# Patient Record
Sex: Female | Born: 1940
Health system: Southern US, Community
[De-identification: ages and names within clinical notes are randomized; demographics above are authoritative.]

## PROBLEM LIST (undated history)

## (undated) DIAGNOSIS — R519 Headache, unspecified: Secondary | ICD-10-CM

## (undated) DIAGNOSIS — F419 Anxiety disorder, unspecified: Secondary | ICD-10-CM

## (undated) DIAGNOSIS — E039 Hypothyroidism, unspecified: Secondary | ICD-10-CM

## (undated) DIAGNOSIS — L0390 Cellulitis, unspecified: Secondary | ICD-10-CM

## (undated) DIAGNOSIS — K219 Gastro-esophageal reflux disease without esophagitis: Secondary | ICD-10-CM

## (undated) DIAGNOSIS — C50919 Malignant neoplasm of unspecified site of unspecified female breast: Secondary | ICD-10-CM

## (undated) DIAGNOSIS — C4490 Unspecified malignant neoplasm of skin, unspecified: Secondary | ICD-10-CM

## (undated) DIAGNOSIS — R112 Nausea with vomiting, unspecified: Secondary | ICD-10-CM

## (undated) DIAGNOSIS — K5730 Diverticulosis of large intestine without perforation or abscess without bleeding: Secondary | ICD-10-CM

## (undated) DIAGNOSIS — T7840XA Allergy, unspecified, initial encounter: Secondary | ICD-10-CM

## (undated) DIAGNOSIS — R51 Headache: Secondary | ICD-10-CM

## (undated) DIAGNOSIS — Z8719 Personal history of other diseases of the digestive system: Secondary | ICD-10-CM

## (undated) DIAGNOSIS — R42 Dizziness and giddiness: Secondary | ICD-10-CM

## (undated) DIAGNOSIS — Z923 Personal history of irradiation: Secondary | ICD-10-CM

## (undated) DIAGNOSIS — Z9889 Other specified postprocedural states: Secondary | ICD-10-CM

## (undated) HISTORY — PX: OTHER SURGICAL HISTORY: SHX169

## (undated) HISTORY — DX: Unspecified malignant neoplasm of skin, unspecified: C44.90

## (undated) HISTORY — DX: Gastro-esophageal reflux disease without esophagitis: K21.9

## (undated) HISTORY — DX: Diverticulosis of large intestine without perforation or abscess without bleeding: K57.30

## (undated) HISTORY — DX: Hypothyroidism, unspecified: E03.9

## (undated) HISTORY — DX: Malignant neoplasm of unspecified site of unspecified female breast: C50.919

## (undated) HISTORY — PX: UPPER GI ENDOSCOPY: SHX6162

## (undated) HISTORY — DX: Allergy, unspecified, initial encounter: T78.40XA

## (undated) HISTORY — PX: DIAGNOSTIC LAPAROSCOPY: SUR761

## (undated) HISTORY — DX: Anxiety disorder, unspecified: F41.9

## (undated) HISTORY — PX: MASTECTOMY: SHX3

## (undated) HISTORY — PX: COLONOSCOPY: SHX174

## (undated) HISTORY — DX: Dizziness and giddiness: R42

## (undated) HISTORY — DX: Personal history of irradiation: Z92.3

## (undated) HISTORY — PX: BREAST BIOPSY: SHX20

---
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
## 1968-12-03 HISTORY — PX: OTHER SURGICAL HISTORY: SHX169

## 1981-12-03 HISTORY — PX: BREAST EXCISIONAL BIOPSY: SUR124

## 2003-04-20 HISTORY — PX: ESOPHAGOGASTRODUODENOSCOPY: SHX1529

## 2004-12-03 LAB — HM DEXA SCAN

## 2005-10-22 ENCOUNTER — Ambulatory Visit: Payer: Self-pay

## 2006-07-29 ENCOUNTER — Ambulatory Visit: Payer: Self-pay | Admitting: Family Medicine

## 2006-08-30 ENCOUNTER — Ambulatory Visit: Payer: Self-pay | Admitting: Family Medicine

## 2006-10-10 ENCOUNTER — Ambulatory Visit: Payer: Self-pay | Admitting: Family Medicine

## 2006-12-16 ENCOUNTER — Encounter: Payer: Self-pay | Admitting: Family Medicine

## 2006-12-16 ENCOUNTER — Ambulatory Visit: Payer: Self-pay | Admitting: Family Medicine

## 2006-12-16 ENCOUNTER — Other Ambulatory Visit: Admission: RE | Admit: 2006-12-16 | Discharge: 2006-12-16 | Payer: Self-pay | Admitting: Family Medicine

## 2006-12-16 LAB — CONVERTED CEMR LAB
ALT: 29 units/L (ref 0–40)
Albumin: 4.2 g/dL (ref 3.5–5.2)
BUN: 16 mg/dL (ref 6–23)
Calcium: 9.7 mg/dL (ref 8.4–10.5)
GFR calc non Af Amer: 77 mL/min
Glomerular Filtration Rate, Af Am: 93 mL/min/{1.73_m2}
HCT: 44.9 % (ref 36.0–46.0)
MCV: 91 fL (ref 78.0–100.0)
Monocytes Absolute: 0.6 10*3/uL (ref 0.2–0.7)
Neutro Abs: 3.9 10*3/uL (ref 1.4–7.7)
Phosphorus Concentration: 5 mg/dL — ABNORMAL HIGH (ref 2.3–4.6)
Potassium: 3.6 meq/L (ref 3.5–5.1)
RBC: 4.94 M/uL (ref 3.87–5.11)
Triglyceride fasting, serum: 125 mg/dL (ref 0–149)
VLDL: 25 mg/dL (ref 0–40)
WBC: 7.1 10*3/uL (ref 4.5–10.5)

## 2006-12-30 ENCOUNTER — Ambulatory Visit: Payer: Self-pay

## 2007-01-01 ENCOUNTER — Ambulatory Visit: Payer: Self-pay | Admitting: Family Medicine

## 2007-03-24 ENCOUNTER — Ambulatory Visit: Payer: Self-pay | Admitting: Family Medicine

## 2007-03-24 LAB — CONVERTED CEMR LAB
ALT: 21 units/L (ref 0–40)
AST: 28 units/L (ref 0–37)
Cholesterol: 197 mg/dL (ref 0–200)
LDL Cholesterol: 127 mg/dL — ABNORMAL HIGH (ref 0–99)
Total CHOL/HDL Ratio: 3.7
VLDL: 17 mg/dL (ref 0–40)

## 2007-05-08 ENCOUNTER — Telehealth (INDEPENDENT_AMBULATORY_CARE_PROVIDER_SITE_OTHER): Payer: Self-pay | Admitting: *Deleted

## 2007-07-04 HISTORY — PX: APPENDECTOMY: SHX54

## 2007-07-08 ENCOUNTER — Telehealth (INDEPENDENT_AMBULATORY_CARE_PROVIDER_SITE_OTHER): Payer: Self-pay | Admitting: *Deleted

## 2007-07-13 ENCOUNTER — Ambulatory Visit: Payer: Self-pay | Admitting: General Surgery

## 2007-10-17 ENCOUNTER — Telehealth: Payer: Self-pay | Admitting: Family Medicine

## 2007-10-17 ENCOUNTER — Ambulatory Visit: Payer: Self-pay | Admitting: Family Medicine

## 2007-10-17 DIAGNOSIS — M810 Age-related osteoporosis without current pathological fracture: Secondary | ICD-10-CM | POA: Insufficient documentation

## 2007-11-04 ENCOUNTER — Encounter: Payer: Self-pay | Admitting: Family Medicine

## 2007-12-03 ENCOUNTER — Ambulatory Visit: Payer: Self-pay | Admitting: Family Medicine

## 2008-01-23 ENCOUNTER — Encounter: Payer: Self-pay | Admitting: Family Medicine

## 2008-01-23 DIAGNOSIS — K5730 Diverticulosis of large intestine without perforation or abscess without bleeding: Secondary | ICD-10-CM | POA: Insufficient documentation

## 2008-01-23 DIAGNOSIS — K589 Irritable bowel syndrome without diarrhea: Secondary | ICD-10-CM

## 2008-01-23 DIAGNOSIS — F419 Anxiety disorder, unspecified: Secondary | ICD-10-CM

## 2008-01-23 DIAGNOSIS — E039 Hypothyroidism, unspecified: Secondary | ICD-10-CM

## 2008-01-23 DIAGNOSIS — N6019 Diffuse cystic mastopathy of unspecified breast: Secondary | ICD-10-CM

## 2008-01-23 DIAGNOSIS — J309 Allergic rhinitis, unspecified: Secondary | ICD-10-CM | POA: Insufficient documentation

## 2008-01-23 DIAGNOSIS — K219 Gastro-esophageal reflux disease without esophagitis: Secondary | ICD-10-CM

## 2008-01-23 DIAGNOSIS — E7800 Pure hypercholesterolemia, unspecified: Secondary | ICD-10-CM

## 2008-02-03 ENCOUNTER — Encounter: Payer: Self-pay | Admitting: Family Medicine

## 2008-02-03 ENCOUNTER — Other Ambulatory Visit: Admission: RE | Admit: 2008-02-03 | Discharge: 2008-02-03 | Payer: Self-pay | Admitting: Family Medicine

## 2008-02-03 ENCOUNTER — Ambulatory Visit: Payer: Self-pay | Admitting: Family Medicine

## 2008-02-09 ENCOUNTER — Encounter (INDEPENDENT_AMBULATORY_CARE_PROVIDER_SITE_OTHER): Payer: Self-pay | Admitting: *Deleted

## 2008-02-11 ENCOUNTER — Ambulatory Visit: Payer: Self-pay | Admitting: Family Medicine

## 2008-02-16 ENCOUNTER — Encounter: Payer: Self-pay | Admitting: Family Medicine

## 2008-02-16 LAB — CONVERTED CEMR LAB
ALT: 21 units/L (ref 0–35)
Alkaline Phosphatase: 44 units/L (ref 39–117)
BUN: 12 mg/dL (ref 6–23)
Basophils Relative: 0.3 % (ref 0.0–1.0)
CO2: 30 meq/L (ref 19–32)
Calcium: 9.2 mg/dL (ref 8.4–10.5)
Cholesterol: 219 mg/dL (ref 0–200)
Creatinine, Ser: 0.8 mg/dL (ref 0.4–1.2)
Hemoglobin: 13.2 g/dL (ref 12.0–15.0)
Monocytes Absolute: 0.5 10*3/uL (ref 0.2–0.7)
Monocytes Relative: 8.5 % (ref 3.0–11.0)
Potassium: 3.8 meq/L (ref 3.5–5.1)
RDW: 13.1 % (ref 11.5–14.6)
Total Bilirubin: 0.8 mg/dL (ref 0.3–1.2)
Total CHOL/HDL Ratio: 3.4
Total Protein: 6.8 g/dL (ref 6.0–8.3)
VLDL: 13 mg/dL (ref 0–40)
WBC: 5.5 10*3/uL (ref 4.5–10.5)

## 2008-02-17 ENCOUNTER — Telehealth: Payer: Self-pay | Admitting: Family Medicine

## 2008-03-02 ENCOUNTER — Ambulatory Visit: Payer: Self-pay | Admitting: Family Medicine

## 2008-03-02 LAB — CONVERTED CEMR LAB: OCCULT 3: NEGATIVE

## 2008-03-03 ENCOUNTER — Encounter (INDEPENDENT_AMBULATORY_CARE_PROVIDER_SITE_OTHER): Payer: Self-pay | Admitting: *Deleted

## 2008-03-03 LAB — FECAL OCCULT BLOOD, GUAIAC: Fecal Occult Blood: NEGATIVE

## 2008-03-17 ENCOUNTER — Encounter: Payer: Self-pay | Admitting: Family Medicine

## 2008-03-22 ENCOUNTER — Encounter (INDEPENDENT_AMBULATORY_CARE_PROVIDER_SITE_OTHER): Payer: Self-pay | Admitting: *Deleted

## 2008-03-31 ENCOUNTER — Telehealth: Payer: Self-pay | Admitting: Family Medicine

## 2008-03-31 ENCOUNTER — Ambulatory Visit: Payer: Self-pay | Admitting: Family Medicine

## 2008-04-01 ENCOUNTER — Encounter (INDEPENDENT_AMBULATORY_CARE_PROVIDER_SITE_OTHER): Payer: Self-pay | Admitting: *Deleted

## 2008-04-01 LAB — CONVERTED CEMR LAB: TSH: 0.03 microintl units/mL — ABNORMAL LOW (ref 0.35–5.50)

## 2008-05-19 ENCOUNTER — Ambulatory Visit: Payer: Self-pay | Admitting: Family Medicine

## 2008-05-20 ENCOUNTER — Encounter (INDEPENDENT_AMBULATORY_CARE_PROVIDER_SITE_OTHER): Payer: Self-pay | Admitting: *Deleted

## 2008-05-20 LAB — CONVERTED CEMR LAB
Free T4: 2.3 ng/dL — ABNORMAL HIGH (ref 0.6–1.6)
TSH: 0.02 microintl units/mL — ABNORMAL LOW (ref 0.35–5.50)

## 2008-05-21 LAB — CONVERTED CEMR LAB: Vit D, 1,25-Dihydroxy: 32 (ref 30–89)

## 2008-06-07 ENCOUNTER — Telehealth: Payer: Self-pay | Admitting: Family Medicine

## 2008-07-07 ENCOUNTER — Ambulatory Visit: Payer: Self-pay | Admitting: Family Medicine

## 2008-08-20 ENCOUNTER — Ambulatory Visit: Payer: Self-pay | Admitting: Family Medicine

## 2008-08-20 DIAGNOSIS — J069 Acute upper respiratory infection, unspecified: Secondary | ICD-10-CM | POA: Insufficient documentation

## 2008-09-27 ENCOUNTER — Ambulatory Visit: Payer: Self-pay | Admitting: Family Medicine

## 2008-10-07 ENCOUNTER — Telehealth: Payer: Self-pay | Admitting: Family Medicine

## 2009-02-24 ENCOUNTER — Ambulatory Visit: Payer: Self-pay | Admitting: Family Medicine

## 2009-03-01 ENCOUNTER — Telehealth: Payer: Self-pay | Admitting: Family Medicine

## 2009-03-24 ENCOUNTER — Ambulatory Visit: Payer: Self-pay | Admitting: Family Medicine

## 2009-04-12 ENCOUNTER — Telehealth: Payer: Self-pay | Admitting: Family Medicine

## 2009-04-29 ENCOUNTER — Encounter: Payer: Self-pay | Admitting: Family Medicine

## 2009-05-06 ENCOUNTER — Encounter (INDEPENDENT_AMBULATORY_CARE_PROVIDER_SITE_OTHER): Payer: Self-pay | Admitting: *Deleted

## 2009-09-19 ENCOUNTER — Ambulatory Visit: Payer: Self-pay | Admitting: Family Medicine

## 2009-09-20 LAB — CONVERTED CEMR LAB
BUN: 17 mg/dL (ref 6–23)
Cholesterol: 241 mg/dL — ABNORMAL HIGH (ref 0–200)
Creatinine, Ser: 0.8 mg/dL (ref 0.4–1.2)
Direct LDL: 172 mg/dL
GFR calc non Af Amer: 76.2 mL/min (ref >60)
Phosphorus: 4 mg/dL (ref 2.3–4.6)
Total CHOL/HDL Ratio: 4
Triglycerides: 73 mg/dL (ref 0.0–149.0)
VLDL: 14.6 mg/dL (ref 0.0–40.0)

## 2009-09-22 ENCOUNTER — Encounter: Payer: Self-pay | Admitting: Family Medicine

## 2009-09-23 ENCOUNTER — Encounter: Payer: Self-pay | Admitting: Family Medicine

## 2009-09-23 ENCOUNTER — Ambulatory Visit: Payer: Self-pay | Admitting: Family Medicine

## 2009-09-23 ENCOUNTER — Other Ambulatory Visit: Admission: RE | Admit: 2009-09-23 | Discharge: 2009-09-23 | Payer: Self-pay | Admitting: Family Medicine

## 2009-09-28 ENCOUNTER — Encounter (INDEPENDENT_AMBULATORY_CARE_PROVIDER_SITE_OTHER): Payer: Self-pay | Admitting: *Deleted

## 2009-10-10 ENCOUNTER — Telehealth: Payer: Self-pay | Admitting: Family Medicine

## 2009-11-04 ENCOUNTER — Ambulatory Visit: Payer: Self-pay | Admitting: Family Medicine

## 2009-11-07 ENCOUNTER — Encounter (INDEPENDENT_AMBULATORY_CARE_PROVIDER_SITE_OTHER): Payer: Self-pay | Admitting: *Deleted

## 2009-11-10 ENCOUNTER — Encounter: Payer: Self-pay | Admitting: Family Medicine

## 2009-12-08 ENCOUNTER — Ambulatory Visit: Payer: Self-pay | Admitting: Family Medicine

## 2009-12-09 LAB — CONVERTED CEMR LAB
Cholesterol: 205 mg/dL — ABNORMAL HIGH (ref 0–200)
Total CHOL/HDL Ratio: 3
Triglycerides: 78 mg/dL (ref 0.0–149.0)

## 2010-10-31 ENCOUNTER — Telehealth: Payer: Self-pay | Admitting: Family Medicine

## 2010-11-02 LAB — FECAL OCCULT BLOOD, GUAIAC: Fecal Occult Blood: NEGATIVE

## 2010-11-03 ENCOUNTER — Telehealth: Payer: Self-pay | Admitting: Family Medicine

## 2010-11-17 ENCOUNTER — Telehealth: Payer: Self-pay | Admitting: Family Medicine

## 2010-12-03 DIAGNOSIS — Z923 Personal history of irradiation: Secondary | ICD-10-CM

## 2010-12-03 HISTORY — DX: Personal history of irradiation: Z92.3

## 2011-01-02 NOTE — Assessment & Plan Note (Signed)
Summary: DISCUSS DEXA   Vital Signs:  Patient profile:   70 year old female Height:      139 inches Temp:     98 degrees F oral Pulse rate:   72 / minute Pulse rhythm:   regular BP sitting:   96 / 62  (right arm) Cuff size:   regular  Vitals Entered By: Lowella Petties CMA (December 08, 2009 8:30 AM) CC: Discuss dexa, needs labs   History of Present Illness: here for f/u of dexa is doing fine overall -- no complaints   it showed OP with T score -3.0 in spine and -2.7 in hip  this is decreased from prev scores   ca and vit D-- is taking without any problems -- 1500 units vit D daily  last vit D level 32   has been intol of fosamax in past  was on boniva in past - did not seem to have any problems   exercise - was doing [employer] -- off [place] 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 Morphine 2)  ! * Bisphorpherates  Past History:  Past Medical History: Last updated: 01/23/2008 Allergic rhinitis Anxiety Diverticulosis, colon Dizziness or vertigo GERD Hypothyroidism Osteoporosis  Past Surgical History: Last updated: 01/23/2008 Knee surgery (1962) Disk surgery on back (1970) Breast biopsy Basal cell left neck Colonoscopy- tics, int hemorrhoids (2004) EGD- GERD, HH,  polyp (2004) Pneumonia Dexa-  osteoporosis (12/2004) Stress test- neg (1999) Abd [name]- neg for AAa (1998) Nuclear stress- normal (12/2006) Appendectomy (07/2007) Dexa- osteoporosis  Family History: Last updated: February 05, 2008 Father: deceased age 77- MI, HTN Mother: CAD, afib Siblings: GM CVA, DM, CAD GM uterine ca  aunt leukemia  Social History: Last updated: 08/20/2008 Marital Status: Married Children: 1 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 Eyes:  Denies blurring and eye irritation. CV:  Denies chest pain or discomfort, palpitations, shortness of breath with exertion, and swelling of feet. Resp:  Denies cough and shortness of breath. GI:  Denies abdominal pain, change in bowel habits, indigestion, and nausea. MS:  Denies joint pain and cramps. Derm:  Denies lesion(s), poor wound healing, and rash. Endo:  Denies cold intolerance, excessive thirst, excessive urination, and heat intolerance. Heme:  Denies abnormal bruising and bleeding.  Physical Exam  General:  Well-developed,well-nourished,in no acute distress; alert,appropriate and cooperative throughout examination Head:  normocephalic, atraumatic, and no abnormalities observed.   Eyes:  vision grossly intact, pupils equal, pupils round, and pupils reactive to light.   Neck:  supple with full rom and no masses or thyromegally, no JVD or carotid bruit  Heart:  Normal rate and regular rhythm. S1 and S2 normal without gallop, murmur, click, rub or other extra sounds. Msk:  medium frame-not petite  nl posture- no kyphosis no rom all joints  Extremities:  No clubbing, cyanosis, edema, or deformity noted with normal full range of motion of all joints.   Skin:  Intact without suspicious lesions or rashes Cervical Nodes:  No lymphadenopathy noted Psych:  normal affect, talkative and pleasant  Impression & Recommendations:  Problem # 1:  OSTEOPOROSIS (ICD-733.00) Assessment Deteriorated with worsened bone density  disc opt for tx - will try boniva again if affordible rev ca and vit D and exercise  disc safety as well for fx prev plan to re check in 2 y if intol of boniva will consider evista Her updated medication list for this problem includes:    Health and safety inspector Glides 500-200-40 Mg-unt-mcg Tabs  (Calcium-vitamin d-vitamin k) .Marland Kitchen... Take 1 by mouth three times a day    Vitamin D 400 Unit Tabs (Cholecalciferol) .Marland Kitchen..Marland Kitchen Two tabs daily    Boniva 150 Mg Tabs (Ibandronate sodium) .Marland Kitchen... 1 by mouth once monthly as directed  Problem # 2:  PURE HYPERCHOLESTEROLEMIA (ICD-272.0) Assessment: Unchanged  check labs today on better diet  pt declines any med at this time rev sat fats in diet  Her updated medication list for this problem includes:    Niacin 500 Mg Tabs (Niacin) .Marland Kitchen... Take by mouth daily  Orders: Venipuncture (16109) TLB-Lipid Panel (80061-LIPID) TLB-ALT (SGPT) (84460-ALT) TLB-AST (SGOT) (84450-SGOT)  Labs Reviewed: SGOT: 24 (09/19/2009)   SGPT: 15 (09/19/2009)   HDL:56.30 (09/19/2009), 64.2 (02/11/2008)  LDL:DEL (02/11/2008), 127 (03/24/2007)  Chol:241 (09/19/2009), 219 (02/11/2008)  Trig:73.0 (09/19/2009), 64 (02/11/2008)  Complete Medication List: 1)  Synthroid 112 Mcg Tabs (Levothyroxine sodium) .... Take one by mouth daily 2)  Adult Aspirin Low Strength 81 Mg Tbdp (Aspirin) .... Take one by mouth daily 3)  Viactiv Flavor Glides 500-200-40 Mg-unt-mcg Tabs (Calcium-vitamin d-vitamin k) .... Take 1 by mouth three times a day 4)  Bentyl 20 Mg Tabs (Dicyclomine hcl) .... Take one by mouth two times a day as needed 5)  Xanax 0.25 Mg Tabs (Alprazolam) .... Take by mouth as directed once daily as needed 6)  Chlorthalidone 50 Mg Tabs (Chlorthalidone) .... Take by mouth as directed as needed once daily 7)  Fluticasone Propionate 50 Mcg/act Susp (Fluticasone propionate) .... Use daily 8)  Niacin 500 Mg Tabs (Niacin) .... Take by mouth daily 9)  Vitamin D 400 Unit Tabs (Cholecalciferol) .... Two tabs daily 10)  Vitamin E 400 Unit Caps (Vitamin e) .... One daily 11)  Vitamin A 700units  .... Two daily 12)  Fish Oil Oil (Fish oil) .... One daily 13)  Flax Oil (Flaxseed (linseed)) .Marland Kitchen.. 1200 daily 14)  Multivitamins Tabs (Multiple vitamin) .... One daily 15)  Prilosec 20 Mg Cpdr  (Omeprazole) .Marland Kitchen.. 1 by mouth once daily in am 16)  Boniva 150 Mg Tabs (Ibandronate sodium) .Marland Kitchen.. 1 by mouth once monthly as directed  Patient Instructions: 1)  try boniva once monthly and let me know if any side effects  2)  labs today  3)  keep up good calcium and vitamin D intake  4)  keep up good exercise  Prescriptions: BONIVA 150 MG TABS (IBANDRONATE SODIUM) 1 by mouth once monthly as directed  #1 x 11   Entered and Authorized by:   Judith Part MD   Signed by:   Judith Part MD on 12/08/2009   Method used:   Print then Give to Patient   RxID:   [id number]   Prior Medications (reviewed today): SYNTHROID 112 MCG TABS (LEVOTHYROXINE SODIUM) take one by mouth daily ADULT ASPIRIN LOW STRENGTH 81 MG  TBDP (ASPIRIN) take one by mouth daily VIACTIV FLAVOR GLIDES 500-200-40 MG-UNT-MCG  TABS (CALCIUM-VITAMIN D-VITAMIN K) take 1 by mouth three times a day BENTYL 20 MG  TABS (DICYCLOMINE HCL) take one by mouth two times  a day as needed XANAX 0.25 MG  TABS (ALPRAZOLAM) take by mouth as directed once daily as needed CHLORTHALIDONE 50 MG  TABS (CHLORTHALIDONE) take by mouth as directed as needed once daily FLUTICASONE PROPIONATE 50 MCG/ACT SUSP (FLUTICASONE PROPIONATE) Use daily NIACIN 500 MG TABS (NIACIN) take by mouth daily VITAMIN D 400 UNIT  TABS (CHOLECALCIFEROL) two tabs daily VITAMIN E 400 UNIT CAPS (VITAMIN E) one daily VITAMIN A 700UNITS () two daily FISH OIL   OIL (FISH OIL) one daily FLAX   OIL (FLAXSEED (LINSEED)) 1200 daily MULTIVITAMINS   TABS (MULTIPLE VITAMIN) one daily PRILOSEC 20 MG CPDR (OMEPRAZOLE) 1 by mouth once daily in am Current Allergies: ! MORPHINE ! * BISPHORPHERATES

## 2011-01-02 NOTE — Progress Notes (Signed)
Summary: regarding boniva  Phone Note From Pharmacy   [facility] [address] [phone]* Summary of Call: Pharmacy faxed a note stating that starting in january pt's insurance will no longer cover boniva, they are asking that you change pt to something else.  Form is on your shelf. Initial call taken by: Lowella Petties CMA, AAMA,  October 31, 2010 8:40 AM  Follow-up for Phone Call        this is unfortunate as she could not tolerate fosamax in the past .... other alt is actonel -- may have similar response  ask pt what she wants me to do  ? change it or pay out of pocket I will keep form  Follow-up by: Judith Part MD,  November 01, 2010 1:22 PM  Additional Follow-up for Phone Call Additional follow up Details #1::        Patient notified as instructed by telephone. Pt said she is changing insurance co 2012 to [place]. Pt will ck with her new insurance co to see if they will cover Boniva and she will call back and let Dr Milinda Antis know.Lewanda Rife LPN  November 02, 2010 1:19 PM   thanks!--MT Additional Follow-up by: Judith Part MD,  November 02, 2010 1:46 PM

## 2011-01-02 NOTE — Progress Notes (Signed)
Summary: boniva will be covered  Phone Note Call from Patient   Caller: Patient Call For: Judith Part MD Summary of Call: Pt called.  Her new insurance that she will get starting in january will cover boniva, so she doesnt need to change to anything else. Initial call taken by: Lowella Petties CMA, AAMA,  November 03, 2010 8:12 AM  Follow-up for Phone Call        that is good news ! f/u with me this winter (can be a check up if she wants to )   Follow-up by: Judith Part MD,  November 03, 2010 9:06 AM  Additional Follow-up for Phone Call Additional follow up Details #1::        Patient notified. She said she will call one day next week to schedule bc her calendar was at work and she didn't have her schedule.  Additional Follow-up by: Janee Morn CMA Duncan Dull),  November 03, 2010 4:37 PM

## 2011-01-04 NOTE — Progress Notes (Signed)
Summary: refill request for xanax  Phone Note Refill Request Message from:  Fax from Pharmacy  Refills Requested: Medication #1:  XANAX 0.25 MG  TABS take by mouth as directed once daily as needed   Last Refilled: 03/20/2010 [facility] [address], [phone].  Initial call taken by: Lowella Petties CMA, AAMA,  November 17, 2010 4:56 PM  Follow-up for Phone Call        px written on EMR for call in  Follow-up by: Judith Part MD,  November 17, 2010 4:59 PM  Additional Follow-up for Phone Call Additional follow up Details #1::        [facility] pharmacy as instructed. Lewanda Rife LPN  November 17, 2010 5:02 PM     New/Updated Medications: XANAX 0.25 MG  TABS (ALPRAZOLAM) take by mouth as directed once daily as needed Prescriptions: XANAX 0.25 MG  TABS (ALPRAZOLAM) take by mouth as directed once daily as needed  #30 x 0   Entered and Authorized by:   Judith Part MD   Signed by:   Lewanda Rife LPN on 81/19/1478   Method used:   [facility]phoned to ...       [facility] [phone]* (retail)       [address] [place], [place]  [address]       Ph: [phone] or [phone]       Fax: [phone]   RxID:   [id number]

## 2011-02-03 ENCOUNTER — Encounter: Payer: Self-pay | Admitting: Family Medicine

## 2011-02-20 ENCOUNTER — Other Ambulatory Visit: Payer: Self-pay | Admitting: Family Medicine

## 2011-02-23 NOTE — Telephone Encounter (Signed)
Pt already scheduled CPX with Dr Milinda Antis 03/21/11.

## 2011-02-26 ENCOUNTER — Other Ambulatory Visit: Payer: Self-pay

## 2011-03-08 ENCOUNTER — Encounter: Payer: Self-pay | Admitting: Family Medicine

## 2011-03-16 ENCOUNTER — Encounter: Payer: Self-pay | Admitting: Family Medicine

## 2011-03-16 ENCOUNTER — Telehealth: Payer: Self-pay | Admitting: Family Medicine

## 2011-03-16 DIAGNOSIS — M810 Age-related osteoporosis without current pathological fracture: Secondary | ICD-10-CM

## 2011-03-16 DIAGNOSIS — E039 Hypothyroidism, unspecified: Secondary | ICD-10-CM

## 2011-03-16 DIAGNOSIS — E7800 Pure hypercholesterolemia, unspecified: Secondary | ICD-10-CM

## 2011-03-16 DIAGNOSIS — K219 Gastro-esophageal reflux disease without esophagitis: Secondary | ICD-10-CM

## 2011-03-16 NOTE — Telephone Encounter (Signed)
Message copied by Roxy Manns on Fri Mar 16, 2011 10:31 AM ------      Message from: Melody Comas      Created: Wed Mar 14, 2011 11:20 AM      Regarding: CPX labs for Monday        Patient having cpx labs, please order future labs.

## 2011-03-19 ENCOUNTER — Other Ambulatory Visit (INDEPENDENT_AMBULATORY_CARE_PROVIDER_SITE_OTHER): Payer: Medicare Other | Admitting: Family Medicine

## 2011-03-19 DIAGNOSIS — E039 Hypothyroidism, unspecified: Secondary | ICD-10-CM

## 2011-03-19 DIAGNOSIS — E785 Hyperlipidemia, unspecified: Secondary | ICD-10-CM

## 2011-03-19 DIAGNOSIS — M810 Age-related osteoporosis without current pathological fracture: Secondary | ICD-10-CM

## 2011-03-19 DIAGNOSIS — K219 Gastro-esophageal reflux disease without esophagitis: Secondary | ICD-10-CM

## 2011-03-19 DIAGNOSIS — E7800 Pure hypercholesterolemia, unspecified: Secondary | ICD-10-CM

## 2011-03-19 LAB — COMPREHENSIVE METABOLIC PANEL
AST: 23 U/L (ref 0–37)
Albumin: 4 g/dL (ref 3.5–5.2)
Alkaline Phosphatase: 44 U/L (ref 39–117)
BUN: 14 mg/dL (ref 6–23)
Potassium: 3.8 mEq/L (ref 3.5–5.1)
Sodium: 140 mEq/L (ref 135–145)
Total Bilirubin: 0.9 mg/dL (ref 0.3–1.2)
Total Protein: 6.5 g/dL (ref 6.0–8.3)

## 2011-03-19 LAB — CBC WITH DIFFERENTIAL/PLATELET
Eosinophils Relative: 1.3 % (ref 0.0–5.0)
HCT: 39.9 % (ref 36.0–46.0)
Hemoglobin: 13.6 g/dL (ref 12.0–15.0)
Lymphs Abs: 2.4 10*3/uL (ref 0.7–4.0)
MCV: 91.6 fl (ref 78.0–100.0)
Monocytes Absolute: 0.5 10*3/uL (ref 0.1–1.0)
Monocytes Relative: 9.2 % (ref 3.0–12.0)
Neutro Abs: 2.8 10*3/uL (ref 1.4–7.7)
Platelets: 175 10*3/uL (ref 150.0–400.0)
WBC: 5.9 10*3/uL (ref 4.5–10.5)

## 2011-03-19 LAB — LDL CHOLESTEROL, DIRECT: Direct LDL: 142 mg/dL

## 2011-03-19 LAB — LIPID PANEL
Cholesterol: 228 mg/dL — ABNORMAL HIGH (ref 0–200)
Total CHOL/HDL Ratio: 4
VLDL: 17 mg/dL (ref 0.0–40.0)

## 2011-03-19 LAB — TSH: TSH: 7.95 u[IU]/mL — ABNORMAL HIGH (ref 0.35–5.50)

## 2011-03-20 LAB — VITAMIN D 25 HYDROXY (VIT D DEFICIENCY, FRACTURES): Vit D, 25-Hydroxy: 40 ng/mL (ref 30–89)

## 2011-03-21 ENCOUNTER — Ambulatory Visit (INDEPENDENT_AMBULATORY_CARE_PROVIDER_SITE_OTHER): Payer: Medicare Other | Admitting: Family Medicine

## 2011-03-21 ENCOUNTER — Encounter: Payer: Self-pay | Admitting: Family Medicine

## 2011-03-21 DIAGNOSIS — Z1231 Encounter for screening mammogram for malignant neoplasm of breast: Secondary | ICD-10-CM | POA: Insufficient documentation

## 2011-03-21 DIAGNOSIS — E7800 Pure hypercholesterolemia, unspecified: Secondary | ICD-10-CM

## 2011-03-21 DIAGNOSIS — E039 Hypothyroidism, unspecified: Secondary | ICD-10-CM

## 2011-03-21 DIAGNOSIS — M810 Age-related osteoporosis without current pathological fracture: Secondary | ICD-10-CM

## 2011-03-21 DIAGNOSIS — Z780 Asymptomatic menopausal state: Secondary | ICD-10-CM | POA: Insufficient documentation

## 2011-03-21 MED ORDER — LEVOTHYROXINE SODIUM 125 MCG PO TABS: 125.0000 ug | ORAL_TABLET | Freq: Every day | ORAL | Status: DC

## 2011-03-21 NOTE — Progress Notes (Signed)
Subjective:    Patient ID: Gabrielle Webb, female    DOB: [birth year], 70 y.o.   MRN: [id number]  HPI Here for check up of chronic med problems  Retired in march - doing [employer] is down 2 lb with good bmi of 24 Is beginning to walk regularly  Saw chiropractor for neck and shoulder pains -- right when she retired - and this is getting better    Pap 10/10 Never had abn pap smears --or new partners Ok with doing one next year  No gyn problems or symptoms     Mam 5/11-- was normal  Self exam-- has fibrocystic change  No lumps or problems   ptx 08 td07 Zoster 08   Got a flu shot this fall   colonosc 4/04-- due 10 y   OP- dexa was 06  Last 2 years ago  On boniva-- 1 more year of bisphosphenate (had fosamax in the past )  No side effects  Vit d level is 40--ok on current dose   Hypothyroid-tsh is high over 7-- need to go up on dose  Clinically-- feeling pretty good  No real changes in hair or skin or energy level  Lipids are up with LDL of 140s up from 120s Does have cad in family - father  Diet is now eating better and less stress  Also thyroid may play a role   Past Medical History  Diagnosis Date  . Allergy   . Anxiety   . GERD (gastroesophageal reflux disease)   . Osteoporosis   . Diverticulosis of colon   . Vertigo   . Hypothyroid     Past Surgical History  Procedure Date  . Breast biopsy   . Knee surgery 1962  . Appendectomy 07/2007    History   Social History  . Marital Status: Married    Spouse Name: N/A    Number of Children: 1  . Years of Education: N/A   Occupational History  . insurance agency    Social History Main Topics  . Smoking status: Never Smoker   . Smokeless tobacco: Never Used  . Alcohol Use: Not on file  . Drug Use: Not on file  . Sexually Active: Not on file   Other Topics Concern  . Not on file   Social History Narrative   Exercises on [employer] elliptical    Family History  Problem Relation Age of Onset  .  Heart attack Father   . Hypertension Father   . Atrial fibrillation Mother   . Coronary artery disease Mother   . Diabetes      Grandmother  . Coronary artery disease      Grandmother  . Uterine cancer      Grandmother  . Leukemia      Aunt       Review of Systems  Constitutional: Negative for appetite change, fatigue and unexpected weight change.  HENT: Negative for congestion and rhinorrhea.   Eyes: Negative for pain and visual disturbance.  Respiratory: Negative for chest tightness and wheezing.   Cardiovascular: Negative.   Gastrointestinal: Negative for nausea, vomiting and abdominal pain.  Genitourinary: Negative for urgency, frequency and vaginal bleeding.  Musculoskeletal: Negative for myalgias and arthralgias.  Skin: Negative for color change, pallor and wound.  Neurological: Negative for weakness, light-headedness and headaches.  Hematological: Negative for adenopathy. Does not bruise/bleed easily.  Psychiatric/Behavioral: Negative for dysphoric mood and decreased concentration.       Objective:  Physical Exam  Constitutional: She appears well-developed and well-nourished. No distress.  HENT:  Head: Normocephalic and atraumatic.  Right Ear: External ear normal.  Left Ear: External ear normal.  Nose: Nose normal.  Mouth/Throat: Oropharynx is clear and moist.  Eyes: Conjunctivae and EOM are normal. Pupils are equal, round, and reactive to light.  Neck: Normal range of motion. Neck supple. No JVD present. Carotid bruit is not present. No thyromegaly present.  Cardiovascular: Normal rate, regular rhythm and normal heart sounds.   Pulmonary/Chest: Effort normal. She has no wheezes.  Abdominal: Soft. Bowel sounds are normal. She exhibits no distension and no mass. There is no tenderness.  Genitourinary: No breast swelling, tenderness, discharge or bleeding.  Musculoskeletal: She exhibits no edema and no tenderness.  Lymphadenopathy:    She has no cervical  adenopathy.  Neurological: She is alert. She has normal reflexes. Coordination normal.  Skin: Skin is warm and dry. No rash noted. No erythema. No pallor.       Many lentigos and some SKs   Psychiatric: She has a normal mood and affect.       More relaxed since she retired            Assessment & Plan:

## 2011-03-21 NOTE — Assessment & Plan Note (Signed)
sched mam for may- yearly Exam today Urged monthly self exams as well for breast cancer screening

## 2011-03-21 NOTE — Patient Instructions (Signed)
We will schedule mammogram and dexa at check out - you are due in May Try to get 1200-1500 mg of calcium per day with at least 1000 iu of vitamin D - for bone health  Continue boniva for one more year  Increase thyroid medicine back to 125 mcg daily Avoid red meat/ fried foods/ egg yolks/ fatty breakfast meats/ butter, cheese and high fat dairy/ and shellfish   Schedule fasting lab and then follow up for about 8 weeks please - for thyroid/ cholesterol and to review bone density

## 2011-03-21 NOTE — Assessment & Plan Note (Signed)
Scheduled 2 y dexa On boniva - this will be approx year 5 of bisphosphenate (last year) Rev ca and D Good D level  Keep up exercise Disc result at f/u

## 2011-03-21 NOTE — Assessment & Plan Note (Signed)
tsh is high again with inc cholesterol as well Clinically no changes  Will increase dose to 125 mcg Lab and visit in 8 wks

## 2011-03-21 NOTE — Assessment & Plan Note (Signed)
This is worse-  ? Due to age/diet or hypothyroidism Rev low sat fat diet in detail Re check labs 8 wk and then follow up

## 2011-04-20 ENCOUNTER — Ambulatory Visit: Payer: Self-pay | Admitting: Family Medicine

## 2011-04-23 ENCOUNTER — Telehealth: Payer: Self-pay | Admitting: *Deleted

## 2011-04-23 NOTE — Telephone Encounter (Signed)
Keep clean if you can with antibacterial soap and water- watch for fever/pus or other signs of infection Prep H is ok  F/u if not further improved

## 2011-04-23 NOTE — Telephone Encounter (Signed)
Patient notified as instructed by telephone. 

## 2011-04-23 NOTE — Telephone Encounter (Signed)
Patient says that she had a hemorrhoid come up about 2 weeks ago and she has been using the preparation h. She says that yesterday it burst and there was a lot of blood, but the pain is now gone. She is asking if there is anything else she should do or to continue using the preparation h. Please advise.

## 2011-04-25 ENCOUNTER — Ambulatory Visit: Payer: Self-pay | Admitting: Family Medicine

## 2011-04-25 ENCOUNTER — Telehealth: Payer: Self-pay

## 2011-04-25 DIAGNOSIS — R928 Other abnormal and inconclusive findings on diagnostic imaging of breast: Secondary | ICD-10-CM | POA: Insufficient documentation

## 2011-04-25 NOTE — Telephone Encounter (Signed)
Will do ref for Gabrielle Webb 

## 2011-04-25 NOTE — Telephone Encounter (Signed)
Patient notified as instructed by telephone that pt needs breast biopsy. Where does pt want to go [place] or [place].Pt prefers breast center of  [facility]. Pt said will wait to hear from [facility] and please call cell # with info [phone].

## 2011-04-26 ENCOUNTER — Telehealth: Payer: Self-pay | Admitting: Family Medicine

## 2011-04-26 ENCOUNTER — Other Ambulatory Visit: Payer: Self-pay | Admitting: Family Medicine

## 2011-04-26 DIAGNOSIS — N63 Unspecified lump in unspecified breast: Secondary | ICD-10-CM

## 2011-04-26 NOTE — Telephone Encounter (Signed)
Need to cancel breast bx order-in error

## 2011-04-26 NOTE — Telephone Encounter (Signed)
[name] Core Biopsy appt made at [facility] [place] [facility] for 05/02/2011 at 2pm. Patient will pick up [facility] MMG disc today. Patient has [facility] MMG disc to bring.

## 2011-05-02 ENCOUNTER — Ambulatory Visit
Admission: RE | Admit: 2011-05-02 | Discharge: 2011-05-02 | Disposition: A | Discharge status: Home / Self Care | Payer: Medicare Other | Source: Ambulatory Visit | Attending: Family Medicine | Admitting: Family Medicine

## 2011-05-02 ENCOUNTER — Other Ambulatory Visit: Payer: Self-pay | Admitting: Family Medicine

## 2011-05-02 ENCOUNTER — Other Ambulatory Visit: Payer: Self-pay | Admitting: Diagnostic Radiology

## 2011-05-02 DIAGNOSIS — N63 Unspecified lump in unspecified breast: Secondary | ICD-10-CM

## 2011-05-03 ENCOUNTER — Ambulatory Visit
Admission: RE | Admit: 2011-05-03 | Discharge: 2011-05-03 | Disposition: A | Discharge status: Home / Self Care | Payer: Medicare Other | Source: Ambulatory Visit | Attending: Family Medicine | Admitting: Family Medicine

## 2011-05-03 ENCOUNTER — Telehealth: Payer: Self-pay | Admitting: Family Medicine

## 2011-05-03 ENCOUNTER — Other Ambulatory Visit: Payer: Self-pay | Admitting: Family Medicine

## 2011-05-03 ENCOUNTER — Encounter: Payer: Self-pay | Admitting: Family Medicine

## 2011-05-03 DIAGNOSIS — C50911 Malignant neoplasm of unspecified site of right female breast: Secondary | ICD-10-CM

## 2011-05-03 DIAGNOSIS — N63 Unspecified lump in unspecified breast: Secondary | ICD-10-CM

## 2011-05-03 NOTE — Telephone Encounter (Signed)
Gabrielle Webb from [facility] [place] called to tell you that the biopsy is positive for breast cancer that they did yesterday. She will [facility] where she will see a surgeon, a medical oncologist, and a radiation oncologist on the same day which is 05/09/2011. She will go over this with Gabrielle Webb when she comes in for results late this afternoon with the breast Ctr. They will be setting her up for a breast MRI before the 06/06 appt, and we will be responsible to get the authorization which we always do.  The appt has not been made yet for her MRI and they will let us know when that will be. They usually fax you a paper asking [name] to get approval. She called [name] asking if they should set her up and I said yes go ahead and if she changes her mind and wants to go to [place] we can always call [facility]. I do believe she will want to proceed with this in [place].  Patient is not aware of this yet until her appt today with them.

## 2011-05-03 NOTE — Telephone Encounter (Signed)
Thanks for the heads up Let me know if I need to order anything in the computer

## 2011-05-04 ENCOUNTER — Other Ambulatory Visit: Payer: Self-pay

## 2011-05-04 NOTE — Telephone Encounter (Signed)
Faxed request from [facility] For [place]. Is ok to refill?

## 2011-05-04 NOTE — Telephone Encounter (Signed)
Spoke with pt and she does not need refill until Monday. I asked Dr Para March if OK to refill since pt newly diagnosed with breast cancer and he said to hold for Dr Milinda Antis to review. Pt does know about the breast cancer and has appt on Monday at [facility] imaging and an appt with dr on Wed about the breast cancer.

## 2011-05-06 NOTE — Telephone Encounter (Signed)
I agree- lets hold on on refilling boniva until we get plan for the breast cancer treatment  Please deny the refil just for now and let pt know

## 2011-05-07 ENCOUNTER — Ambulatory Visit
Admission: RE | Admit: 2011-05-07 | Discharge: 2011-05-07 | Disposition: A | Discharge status: Home / Self Care | Payer: Medicare Other | Source: Ambulatory Visit | Attending: Family Medicine | Admitting: Family Medicine

## 2011-05-07 ENCOUNTER — Other Ambulatory Visit: Payer: Medicare Other

## 2011-05-07 ENCOUNTER — Telehealth: Payer: Self-pay

## 2011-05-07 DIAGNOSIS — C50911 Malignant neoplasm of unspecified site of right female breast: Secondary | ICD-10-CM

## 2011-05-07 MED ORDER — GADOBENATE DIMEGLUMINE 529 MG/ML IV SOLN
13.0000 mL | Freq: Once | INTRAVENOUS | Status: AC | PRN
  Administered 2011-05-07: 13 mL via INTRAVENOUS

## 2011-05-07 NOTE — Telephone Encounter (Signed)
Left message for pt to call back re; Boniva refill. Dr Milinda Antis said to hold off on refilling Boniva until we get plan for the breast cancer treatment. Sandrea Hammond was denied to pharmacy as instructed.

## 2011-05-08 NOTE — Telephone Encounter (Signed)
Patient notified as instructed by telephone. 

## 2011-05-09 ENCOUNTER — Other Ambulatory Visit: Payer: Self-pay | Admitting: Oncology

## 2011-05-09 ENCOUNTER — Other Ambulatory Visit (HOSPITAL_COMMUNITY): Payer: Self-pay | Admitting: Surgery

## 2011-05-09 ENCOUNTER — Other Ambulatory Visit: Payer: Self-pay | Admitting: Surgery

## 2011-05-09 ENCOUNTER — Encounter (HOSPITAL_BASED_OUTPATIENT_CLINIC_OR_DEPARTMENT_OTHER): Payer: Medicare Other | Admitting: Oncology

## 2011-05-09 DIAGNOSIS — C50419 Malignant neoplasm of upper-outer quadrant of unspecified female breast: Secondary | ICD-10-CM

## 2011-05-09 DIAGNOSIS — Z170 Estrogen receptor positive status [ER+]: Secondary | ICD-10-CM

## 2011-05-09 DIAGNOSIS — C50911 Malignant neoplasm of unspecified site of right female breast: Secondary | ICD-10-CM

## 2011-05-09 DIAGNOSIS — N63 Unspecified lump in unspecified breast: Secondary | ICD-10-CM

## 2011-05-09 LAB — COMPREHENSIVE METABOLIC PANEL
ALT: 15 U/L (ref 0–35)
CO2: 30 mEq/L (ref 19–32)
Calcium: 10 mg/dL (ref 8.4–10.5)
Chloride: 105 mEq/L (ref 96–112)
Creatinine, Ser: 0.63 mg/dL (ref 0.50–1.10)
Glucose, Bld: 91 mg/dL (ref 70–99)
Sodium: 143 mEq/L (ref 135–145)
Total Protein: 6.9 g/dL (ref 6.0–8.3)

## 2011-05-09 LAB — CBC WITH DIFFERENTIAL/PLATELET
BASO%: 0.3 % (ref 0.0–2.0)
Eosinophils Absolute: 0.1 10*3/uL (ref 0.0–0.5)
HCT: 37.6 % (ref 34.8–46.6)
MCHC: 34.6 g/dL (ref 31.5–36.0)
MONO#: 0.6 10*3/uL (ref 0.1–0.9)
NEUT#: 3.6 10*3/uL (ref 1.5–6.5)
NEUT%: 56.9 % (ref 38.4–76.8)
Platelets: 226 10*3/uL (ref 145–400)
WBC: 6.4 10*3/uL (ref 3.9–10.3)
lymph#: 2.1 10*3/uL (ref 0.9–3.3)

## 2011-05-09 LAB — CANCER ANTIGEN 27.29: CA 27.29: 17 U/mL (ref 0–39)

## 2011-05-10 ENCOUNTER — Other Ambulatory Visit: Payer: Self-pay | Admitting: *Deleted

## 2011-05-10 MED ORDER — IBANDRONATE SODIUM 150 MG PO TABS: 150.0000 mg | ORAL_TABLET | ORAL | Status: DC

## 2011-05-11 ENCOUNTER — Other Ambulatory Visit: Payer: Self-pay | Admitting: *Deleted

## 2011-05-11 ENCOUNTER — Telehealth: Payer: Self-pay | Admitting: *Deleted

## 2011-05-11 MED ORDER — IBANDRONATE SODIUM 150 MG PO TABS: 150.0000 mg | ORAL_TABLET | ORAL | Status: DC

## 2011-05-14 ENCOUNTER — Other Ambulatory Visit: Payer: Self-pay | Admitting: Surgery

## 2011-05-14 ENCOUNTER — Encounter (HOSPITAL_BASED_OUTPATIENT_CLINIC_OR_DEPARTMENT_OTHER)
Admission: RE | Admit: 2011-05-14 | Discharge: 2011-05-14 | Disposition: A | Discharge status: Home / Self Care | Payer: Medicare Other | Source: Ambulatory Visit | Attending: Surgery | Admitting: Surgery

## 2011-05-14 ENCOUNTER — Ambulatory Visit
Admission: RE | Admit: 2011-05-14 | Discharge: 2011-05-14 | Disposition: A | Discharge status: Home / Self Care | Payer: Medicare Other | Source: Ambulatory Visit | Attending: Surgery | Admitting: Surgery

## 2011-05-14 DIAGNOSIS — Z01811 Encounter for preprocedural respiratory examination: Secondary | ICD-10-CM

## 2011-05-16 ENCOUNTER — Ambulatory Visit
Admission: RE | Admit: 2011-05-16 | Discharge: 2011-05-16 | Disposition: A | Discharge status: Home / Self Care | Payer: Medicare Other | Source: Ambulatory Visit | Attending: Surgery | Admitting: Surgery

## 2011-05-16 ENCOUNTER — Other Ambulatory Visit (INDEPENDENT_AMBULATORY_CARE_PROVIDER_SITE_OTHER): Payer: Self-pay | Admitting: Surgery

## 2011-05-16 ENCOUNTER — Ambulatory Visit (HOSPITAL_BASED_OUTPATIENT_CLINIC_OR_DEPARTMENT_OTHER)
Admission: RE | Admit: 2011-05-16 | Discharge: 2011-05-16 | Disposition: A | Discharge status: Home / Self Care | Payer: Medicare Other | Source: Ambulatory Visit | Attending: Surgery | Admitting: Surgery

## 2011-05-16 ENCOUNTER — Other Ambulatory Visit: Payer: Self-pay | Admitting: Surgery

## 2011-05-16 ENCOUNTER — Ambulatory Visit (HOSPITAL_COMMUNITY)
Admission: RE | Admit: 2011-05-16 | Discharge: 2011-05-16 | Disposition: A | Discharge status: Home / Self Care | Payer: Medicare Other | Source: Ambulatory Visit | Attending: Surgery | Admitting: Surgery

## 2011-05-16 ENCOUNTER — Other Ambulatory Visit: Payer: Medicare Other

## 2011-05-16 DIAGNOSIS — Z01818 Encounter for other preprocedural examination: Secondary | ICD-10-CM | POA: Insufficient documentation

## 2011-05-16 DIAGNOSIS — N63 Unspecified lump in unspecified breast: Secondary | ICD-10-CM

## 2011-05-16 DIAGNOSIS — C50419 Malignant neoplasm of upper-outer quadrant of unspecified female breast: Secondary | ICD-10-CM | POA: Insufficient documentation

## 2011-05-16 DIAGNOSIS — C50919 Malignant neoplasm of unspecified site of unspecified female breast: Secondary | ICD-10-CM | POA: Insufficient documentation

## 2011-05-16 DIAGNOSIS — Z01812 Encounter for preprocedural laboratory examination: Secondary | ICD-10-CM | POA: Insufficient documentation

## 2011-05-16 DIAGNOSIS — C50911 Malignant neoplasm of unspecified site of right female breast: Secondary | ICD-10-CM

## 2011-05-16 DIAGNOSIS — D0590 Unspecified type of carcinoma in situ of unspecified breast: Secondary | ICD-10-CM | POA: Insufficient documentation

## 2011-05-16 DIAGNOSIS — K219 Gastro-esophageal reflux disease without esophagitis: Secondary | ICD-10-CM | POA: Insufficient documentation

## 2011-05-16 HISTORY — PX: BREAST LUMPECTOMY: SHX2

## 2011-05-16 LAB — POCT HEMOGLOBIN-HEMACUE: Hemoglobin: 14.2 g/dL (ref 12.0–15.0)

## 2011-05-16 MED ORDER — TECHNETIUM TC 99M SULFUR COLLOID FILTERED
1.0000 | Freq: Once | INTRAVENOUS | Status: AC | PRN
  Administered 2011-05-16: 1 via INTRADERMAL

## 2011-05-17 ENCOUNTER — Other Ambulatory Visit: Payer: Medicare Other

## 2011-05-18 NOTE — Op Note (Addendum)
Gabrielle Webb, Gabrielle Webb             ACCOUNT NO.:  [id number]  MEDICAL RECORD NO.:  [id number]  LOCATION:  [facility]C                          FACILITY:  [facility]  PHYSICIAN:  Currie Paris, M.D.DATE OF BIRTH:  19-Jul-1941  DATE OF PROCEDURE:  05/16/2011 DATE OF DISCHARGE:                              OPERATIVE REPORT   PREOPERATIVE DIAGNOSIS:  Carcinoma right breast upper outer quadrant, clinical stage I.  POSTOPERATIVE DIAGNOSIS:  Carcinoma right breast upper outer quadrant, clinical stage I.  PROCEDURE:  Needle-guided right lumpectomy with blue dye injection and sentinel lymph node biopsy.  SURGEON:  Currie Paris, MD  ANESTHESIA:  General.  CLINICAL HISTORY:  This patient recently diagnosed with a right breast cancer in an area where she has had some biopsies in the past one of which had shown some low-grade atypical hyperplasia.  The current cancer appeared to be invasive ductal.  After consultation with the patient and discussions with her medical and radiation oncologist, we elected to proceed to a lumpectomy with sentinel node evaluation.  DESCRIPTION OF PROCEDURE:  I saw the patient in the holding area and she had no further questions.  We confirmed the plans for surgery as noted above.  I marked the right breast as the operative side.  I reviewed the localizing films and saw the clip adjacent to the wire and noted that the radiologist had marked an X on the area of the breast where the tumor was located.  This was in the upper outer quadrant about halfway between two prior curvilinear scars from prior surgery.  The patient was then taken to the operating room and after satisfactory general anesthesia had been obtained time-out was done.  I injected 5 mL of dilute methylene blue subareolarly on the right side and massaged that in.  After full prep and drape and a repeat time-out, we proceeded with a lumpectomy.  I made a curvilinear incision directly over the  X on the skin.  I raised the skin flap inferiorly until I could manipulate the guidewire into the wound.  I then grasped tissue on either side of the guidewire with some Allis clamps and took tissue out around the guidewire until I was beyond the tip.  I could then palpate the tumor in the specimen.  By palpation, I was around it well in all directions with the exception that it seemed to be close right at about the junction of the superior, anterior and lateral margin.  I therefore went back and took some extra tissue for that one spot.  However, in this area, this all appeared to be just fibrofatty tissue with no actual breast tissue.  I spent several minutes irrigating making sure everything was dry.  I put 20 mL of 0.25% plain Marcaine in to help with postop pain relief.  I put clips in to mark the margins of the excision.  I really could not close much of the defect without causing significant deformity, so I just closed the subcu and skin leaving a cavity to fill up as a seroma cavity.  Attention was then turned to the axilla.  I had marked the skin with a Neoprobe and made a transverse incision.  Once I entered the axillary fat, I found a hot area and with a little gentle dissection saw a pink lymph node which was grasped and excised.  Had counts about 450.  Using the Neoprobe again, I found an adjacent lymph node that was removed and had counts about 300.  Neither had any blue dye and I did not see any blue dye coming into the axilla.  With these two out there were counts only at background 4-5.  I saw no blue dye and palpated no abnormal nodes.  Again I irrigated, put in some Marcaine to help with postop pain relief and closed in layers with 3-0 Vicryl, 4-0 Monocryl subcuticular and Dermabond.  The patient tolerated the procedure well and there were no complications.  All counts were correct.     Currie Paris, M.D.     CJS/MEDQ  D:  05/16/2011  T:   05/17/2011  Job:  [id number]  cc:   Marne A. Milinda Antis, MD Lurline Hare, M.D. Pierce Crane, M.D., F.R.C.P.C.  Electronically Signed by Cyndia Bent M.D. on 05/21/2011 05:50:41 PM

## 2011-05-21 ENCOUNTER — Telehealth: Payer: Self-pay | Admitting: *Deleted

## 2011-05-21 ENCOUNTER — Ambulatory Visit: Payer: Medicare Other | Admitting: Family Medicine

## 2011-05-21 NOTE — Telephone Encounter (Signed)
Patient notified as instructed by telephone. 

## 2011-05-21 NOTE — Telephone Encounter (Signed)
Unless her oncologist recommends she hold it or recommends something else-stay on it

## 2011-05-21 NOTE — Telephone Encounter (Signed)
Patient is asking if she needs to continue with the Boniva at this point. Please advise.

## 2011-05-24 ENCOUNTER — Encounter (INDEPENDENT_AMBULATORY_CARE_PROVIDER_SITE_OTHER): Payer: Self-pay | Admitting: Surgery

## 2011-05-29 ENCOUNTER — Ambulatory Visit (INDEPENDENT_AMBULATORY_CARE_PROVIDER_SITE_OTHER): Payer: Medicare Other | Admitting: Surgery

## 2011-05-29 ENCOUNTER — Encounter (INDEPENDENT_AMBULATORY_CARE_PROVIDER_SITE_OTHER): Payer: Self-pay | Admitting: Surgery

## 2011-05-29 VITALS — BP 102/74 | HR 68 | Temp 98.2°F | Resp 16 | Ht 64.0 in | Wt 140.0 lb

## 2011-05-29 DIAGNOSIS — Z853 Personal history of malignant neoplasm of breast: Secondary | ICD-10-CM | POA: Insufficient documentation

## 2011-05-29 DIAGNOSIS — C50919 Malignant neoplasm of unspecified site of unspecified female breast: Secondary | ICD-10-CM

## 2011-05-29 NOTE — Progress Notes (Signed)
Subjective:     Patient ID: Gabrielle Webb, female   DOB: May 11, 1941, 70 y.o.   MRN: [id number]    BP 102/74  Pulse 68  Temp(Src) 98.2 F (36.8 C) (Temporal)  Resp 16  Ht 5\' 4"  (1.626 m)  Wt 140 lb (63.504 kg)  BMI 24.03 kg/m2  LMP 12/03/1988    HPIThis patient is in for her first postoperative visit following right lumpectomy and sentinel node evaluation done on May 16, 2011. She feels that she is doing well. She has had minimal pain. She has no concerns about her incision.   Review of Systems There have been no changes in her review of systems since the original    Objective:   Physical ExamGen.: The patient is alert and comfortable.  Breasts: Both incisions are healing nicely. There is no swelling. There is no ecchymosis. There is no redness.     Assessment:      Impression: Doing well following surgery.    Plan:    I plan to see her in 2 or 3 months. She has not yet make followup appointments with either medical or radiation oncology. I sent both of those physicians in the mail asking him to call her with that appointment.  I discussed her pathology report with her and gave her a copy for her records.

## 2011-05-29 NOTE — Patient Instructions (Signed)
I will see you again in about two months, after you have finished radiation.  If you haven't heard from Dr Donnie Coffin or Michell Heinrich about follow-up call our office and we will check on it

## 2011-06-03 DIAGNOSIS — C50919 Malignant neoplasm of unspecified site of unspecified female breast: Secondary | ICD-10-CM

## 2011-06-03 HISTORY — DX: Malignant neoplasm of unspecified site of unspecified female breast: C50.919

## 2011-06-12 ENCOUNTER — Ambulatory Visit
Admission: RE | Admit: 2011-06-12 | Discharge: 2011-06-12 | Disposition: A | Discharge status: Home / Self Care | Payer: Medicare Other | Source: Ambulatory Visit | Attending: Radiation Oncology | Admitting: Radiation Oncology

## 2011-06-12 DIAGNOSIS — C50919 Malignant neoplasm of unspecified site of unspecified female breast: Secondary | ICD-10-CM | POA: Insufficient documentation

## 2011-06-12 DIAGNOSIS — L988 Other specified disorders of the skin and subcutaneous tissue: Secondary | ICD-10-CM | POA: Insufficient documentation

## 2011-06-12 DIAGNOSIS — Y838 Other surgical procedures as the cause of abnormal reaction of the patient, or of later complication, without mention of misadventure at the time of the procedure: Secondary | ICD-10-CM | POA: Insufficient documentation

## 2011-06-12 DIAGNOSIS — IMO0002 Reserved for concepts with insufficient information to code with codable children: Secondary | ICD-10-CM | POA: Insufficient documentation

## 2011-06-12 DIAGNOSIS — Z510 Encounter for antineoplastic radiation therapy: Secondary | ICD-10-CM | POA: Insufficient documentation

## 2011-06-12 DIAGNOSIS — Y842 Radiological procedure and radiotherapy as the cause of abnormal reaction of the patient, or of later complication, without mention of misadventure at the time of the procedure: Secondary | ICD-10-CM | POA: Insufficient documentation

## 2011-06-26 ENCOUNTER — Encounter (HOSPITAL_BASED_OUTPATIENT_CLINIC_OR_DEPARTMENT_OTHER): Payer: Medicare Other | Admitting: Oncology

## 2011-06-26 ENCOUNTER — Other Ambulatory Visit: Payer: Self-pay | Admitting: Oncology

## 2011-06-26 DIAGNOSIS — Z170 Estrogen receptor positive status [ER+]: Secondary | ICD-10-CM

## 2011-06-26 DIAGNOSIS — C50419 Malignant neoplasm of upper-outer quadrant of unspecified female breast: Secondary | ICD-10-CM

## 2011-07-06 ENCOUNTER — Encounter (INDEPENDENT_AMBULATORY_CARE_PROVIDER_SITE_OTHER): Payer: Self-pay | Admitting: Surgery

## 2011-07-06 ENCOUNTER — Ambulatory Visit (INDEPENDENT_AMBULATORY_CARE_PROVIDER_SITE_OTHER): Payer: Medicare Other | Admitting: Surgery

## 2011-07-06 VITALS — BP 104/68 | HR 64 | Temp 97.3°F

## 2011-07-06 DIAGNOSIS — Z9889 Other specified postprocedural states: Secondary | ICD-10-CM

## 2011-07-06 DIAGNOSIS — C50919 Malignant neoplasm of unspecified site of unspecified female breast: Secondary | ICD-10-CM

## 2011-07-06 NOTE — Progress Notes (Signed)
CC: Postop visit  HPI: This patient comes in for post op follow-up. She underwent right lumpectomy and sentinel node on 613, 2012. She feels that she is doing well except for some swelling in the right axilla. It is uncomfortable when she sits her arm to her side.  PE: General: The patient appears to be healthy, NAD  The lumpectomy site is healing nicely with no evidence of infection or problem. The axillary incision has a small seroma anteriorly. I aspirated that and retrieved 5 cc of clear serous material. This resolved the discomfort.  IMPRESSION: The patient is doing well S/P lumpectomy/sentinel node.  DATA REVIWED: No new data  PLAN: I will see her back in two months. If the seroma will be performed she will call and I will see her sooner.

## 2011-07-06 NOTE — Patient Instructions (Signed)
I need to see you in two months, but if the fluid re-accumulates then come back sooner. Just call for an appointment ment

## 2011-07-17 ENCOUNTER — Other Ambulatory Visit: Payer: Self-pay | Admitting: *Deleted

## 2011-07-17 MED ORDER — ALPRAZOLAM 0.25 MG PO TABS: 0.2500 mg | ORAL_TABLET | Freq: Every day | ORAL | Status: DC | PRN

## 2011-07-17 NOTE — Telephone Encounter (Signed)
Last filled 11/17/10.

## 2011-07-17 NOTE — Telephone Encounter (Signed)
Px written for call in   

## 2011-07-17 NOTE — Telephone Encounter (Signed)
Medication phoned to [facility] [address] pharmacy as instructed.  

## 2011-07-24 ENCOUNTER — Telehealth: Payer: Self-pay | Admitting: *Deleted

## 2011-07-24 NOTE — Telephone Encounter (Signed)
Thanks you for the update - will keep that in mind

## 2011-07-24 NOTE — Telephone Encounter (Signed)
Pt just wanted you to know that she has finished her radiation treatment for breast cancer and has been given tamoxifen.  Her oncologist wanted you to be aware that she is taking this since it can raise cholesterol and thyroid levels.

## 2011-07-27 ENCOUNTER — Ambulatory Visit (INDEPENDENT_AMBULATORY_CARE_PROVIDER_SITE_OTHER): Payer: Medicare Other | Admitting: Surgery

## 2011-07-27 ENCOUNTER — Encounter (INDEPENDENT_AMBULATORY_CARE_PROVIDER_SITE_OTHER): Payer: Self-pay | Admitting: Surgery

## 2011-07-27 VITALS — BP 102/66 | HR 96

## 2011-07-27 DIAGNOSIS — Z9889 Other specified postprocedural states: Secondary | ICD-10-CM

## 2011-07-27 NOTE — Progress Notes (Signed)
CC: Postop visit  HPI: This patient comes in for post op follow-up. 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 She feels that she is doing well except for some swelling in the right axilla. It is uncomfortable when she sits her arm to her side.  PE: General: The patient appears to be healthy, NAD  The lumpectomy site is healing nicely with no evidence of infection or problem. The breast shows acute radiation changes. The axillary incision has a small seroma anteriorly. I aspirated that and retrieved 20 cc of clear serous material. This resolved the discomfort.  IMPRESSION: The patient is doing well S/P lumpectomy/sentinel node.  DATA REVIWED: No new data  PLAN: I will see her back in two months. If the seroma reforms  she will call and I will see her sooner.

## 2011-07-28 ENCOUNTER — Encounter: Payer: Self-pay | Admitting: Family Medicine

## 2011-08-16 ENCOUNTER — Ambulatory Visit
Admission: RE | Admit: 2011-08-16 | Discharge: 2011-08-16 | Disposition: A | Discharge status: Home / Self Care | Payer: Medicare Other | Source: Ambulatory Visit | Attending: Radiation Oncology | Admitting: Radiation Oncology

## 2011-08-30 ENCOUNTER — Other Ambulatory Visit: Payer: Self-pay | Admitting: *Deleted

## 2011-08-30 MED ORDER — DICYCLOMINE HCL 10 MG PO CAPS: 10.0000 mg | ORAL_CAPSULE | Freq: Two times a day (BID) | ORAL | Status: DC | PRN

## 2011-08-30 NOTE — Telephone Encounter (Signed)
Will refill electronically  

## 2011-09-19 ENCOUNTER — Ambulatory Visit (INDEPENDENT_AMBULATORY_CARE_PROVIDER_SITE_OTHER): Payer: Medicare Other | Admitting: Surgery

## 2011-09-19 ENCOUNTER — Encounter (INDEPENDENT_AMBULATORY_CARE_PROVIDER_SITE_OTHER): Payer: Self-pay | Admitting: Surgery

## 2011-09-19 VITALS — BP 104/66 | HR 80 | Temp 97.4°F | Resp 16 | Ht 65.0 in | Wt 143.8 lb

## 2011-09-19 DIAGNOSIS — C50919 Malignant neoplasm of unspecified site of unspecified female breast: Secondary | ICD-10-CM

## 2011-09-19 NOTE — Patient Instructions (Signed)
I will see you in nine months for follow up but if you think the small fluid collection in the right armpit area has recurred call and I will see you sooner.

## 2011-09-19 NOTE — Progress Notes (Signed)
CC: Postop visit  HPI: This patient comes in for post op follow-up. 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 She feels that she is doing well except for some swelling in the right axilla. It is uncomfortable when she sits her arm to her side. She is now on Tamoxifen  PE: General: The patient appears to be healthy, NAD  The lumpectomy site is healing nicely with no evidence of infection or problem. The breast shows minimal radiation changes. The axillary incision has a small seroma anteriorly. I aspirated that and retrieved 5cc of clear serous material. This resolved the discomfort.  IMPRESSION: The patient is doing well S/P lumpectomy/sentinel node.  DATA REVIWED: No new data  PLAN: I will see her back in nine months. If the seroma reforms  she will call and I will see her sooner.

## 2011-09-26 ENCOUNTER — Other Ambulatory Visit: Payer: Self-pay | Admitting: Oncology

## 2011-09-26 ENCOUNTER — Encounter (HOSPITAL_BASED_OUTPATIENT_CLINIC_OR_DEPARTMENT_OTHER): Payer: Medicare Other | Admitting: Oncology

## 2011-09-26 DIAGNOSIS — Z170 Estrogen receptor positive status [ER+]: Secondary | ICD-10-CM

## 2011-09-26 DIAGNOSIS — Z9889 Other specified postprocedural states: Secondary | ICD-10-CM

## 2011-09-26 DIAGNOSIS — C50419 Malignant neoplasm of upper-outer quadrant of unspecified female breast: Secondary | ICD-10-CM

## 2011-09-26 LAB — CBC WITH DIFFERENTIAL/PLATELET
BASO%: 0.2 % (ref 0.0–2.0)
EOS%: 1.3 % (ref 0.0–7.0)
MCH: 30.5 pg (ref 25.1–34.0)
MCHC: 34.3 g/dL (ref 31.5–36.0)
MCV: 88.7 fL (ref 79.5–101.0)
MONO%: 7.7 % (ref 0.0–14.0)
RBC: 4.2 10*6/uL (ref 3.70–5.45)
RDW: 14.2 % (ref 11.2–14.5)

## 2011-09-27 LAB — COMPREHENSIVE METABOLIC PANEL
ALT: 12 U/L (ref 0–35)
AST: 16 U/L (ref 0–37)
Calcium: 8.4 mg/dL (ref 8.4–10.5)
Chloride: 108 mEq/L (ref 96–112)
Creatinine, Ser: 0.79 mg/dL (ref 0.50–1.10)
Potassium: 4.2 mEq/L (ref 3.5–5.3)

## 2011-10-30 ENCOUNTER — Telehealth: Payer: Self-pay | Admitting: *Deleted

## 2011-10-30 NOTE — Telephone Encounter (Signed)
Pt called and stated she has leg cramping bilaterally x 2 weeks.  Pt denies redness or swelling.  Pt instructed to eat French[employer] mustard on saltine cracker nightly per Sharyl Nimrod PA.  Pt is to return call if no relief of symptoms after 1-2 weeks.  Pt verbalized understanding.

## 2011-11-12 ENCOUNTER — Ambulatory Visit
Admission: RE | Admit: 2011-11-12 | Discharge: 2011-11-12 | Disposition: A | Discharge status: Home / Self Care | Payer: Medicare Other | Source: Ambulatory Visit | Attending: Oncology | Admitting: Oncology

## 2011-11-12 DIAGNOSIS — Z9889 Other specified postprocedural states: Secondary | ICD-10-CM

## 2011-11-14 ENCOUNTER — Encounter: Payer: Self-pay | Admitting: *Deleted

## 2011-11-15 ENCOUNTER — Ambulatory Visit
Admission: RE | Admit: 2011-11-15 | Discharge: 2011-11-15 | Disposition: A | Discharge status: Home / Self Care | Payer: Medicare Other | Source: Ambulatory Visit | Attending: Radiation Oncology | Admitting: Radiation Oncology

## 2011-11-15 ENCOUNTER — Other Ambulatory Visit: Payer: Self-pay

## 2011-11-15 VITALS — BP 102/71 | HR 67 | Temp 97.9°F | Wt 143.9 lb

## 2011-11-15 DIAGNOSIS — C50419 Malignant neoplasm of upper-outer quadrant of unspecified female breast: Secondary | ICD-10-CM

## 2011-11-15 NOTE — Progress Notes (Signed)
follow up post radiation to right breast.treatment completed 07/11/11. Had no problems with radiation except mild hyperpigmentation. 11/12/11:Mammogram reveals no malignancy.

## 2011-11-16 NOTE — Progress Notes (Signed)
CC:   Gabrielle Webb, M.D., F.R.C.P.C. Gabrielle Webb, M.D. Gabrielle A. Tower, MD  DIAGNOSIS:  T1 N0 right breast cancer.  PREVIOUS RADIATION:  42.72 Gy completed 07/11/2011.  INTERVAL SINCE TREATMENT:  4 months.  INTERVAL HISTORY:  Gabrielle Webb reports for followup today.  She has not had any more drainage of her seroma cavity.  She has been taking her tamoxifen and developed some leg cramps.  She was told to take mustard on saltine crackers and has been doing that with good results.  She wondered if there is something different, whether she could switch to aromatase inhibitor, but obviously her osteoporosis prevents that from happening.  She had a retinal tear in her eye which she has been healing from and has about 4 basal cell skin cancers that she has to have removed.  Her biggest breast-related complaint is some pain underneath her right shoulder blade, which she states is worse at night when she tries to lie down on that side.  Her mammograms were negative on Monday. She also had a bone density which showed continued osteoporosis.  For the pain, she has been doing her exercise that she learned at the [facility] class.  She is somewhat encouraged that it has moved from being numb to hurting, but otherwise is doing well.  PHYSICAL EXAMINATION:  General:  She is a pleasant female in no distress, sitting comfortably on the exam room table.  Vital Signs: Temperature 97.9, blood pressure 102/71, pulse 67, weight 144 pounds. Lymph:  She has no palpable cervical or supraclavicular adenopathy.  Her skin is well-healed.  She really has no seroma cavity in her axilla. She has some hardened tissue around her seroma cavity.  I am not able to elicit any pain when I am examining her.  Nothing palpable in the left breast.  No palpable axillary adenopathy bilaterally.  IMPRESSION:  T1 N0 right breast cancer, status post lumpectomy and radiation on maintenance tamoxifen with no evidence of  disease.  RECOMMENDATIONS:  Gabrielle Webb looks good from a breast cancer standpoint. We went over the pros and cons of aromatase inhibitors versus tamoxifen versus stopping.  She does have that history of multiple biopsies on the left breast, so she would like to continue her tamoxifen.  She understands that probably aromatase inhibitor is not a good option for her.  I have told her to continue doing her exercises and if the pain on the right is not better by the end of January or early February, we can send her back to physical therapy for a full and formal evaluation.  She is going to call us and let us know.  I have scheduled her for followup in 3 months.  She knows to contact us with any questions or concerns.    ______________________________ Lurline Hare, M.D. SW/MEDQ  D:  11/15/2011  T:  11/15/2011  Job:  [id number]

## 2011-11-19 ENCOUNTER — Other Ambulatory Visit: Payer: Self-pay | Admitting: *Deleted

## 2011-11-19 DIAGNOSIS — C50919 Malignant neoplasm of unspecified site of unspecified female breast: Secondary | ICD-10-CM

## 2011-11-19 MED ORDER — TAMOXIFEN CITRATE 20 MG PO TABS: 20.0000 mg | ORAL_TABLET | Freq: Every day | ORAL | Status: DC

## 2011-11-19 NOTE — Telephone Encounter (Signed)
Pt states she will have new Rx mail order in 2013 and request printed #90-day supply for all meds to either be mailed or she will p/u.

## 2011-11-19 NOTE — Telephone Encounter (Signed)
I reviewed her med list - some meds from [name] / some from other docs and some prn Please find out which ones she needs, print them out and I will sign them for pick up-thanks

## 2011-11-26 ENCOUNTER — Other Ambulatory Visit: Payer: Self-pay

## 2011-11-26 MED ORDER — DICYCLOMINE HCL 10 MG PO CAPS: 10.0000 mg | ORAL_CAPSULE | Freq: Two times a day (BID) | ORAL | Status: DC | PRN

## 2011-11-26 MED ORDER — ALPRAZOLAM 0.25 MG PO TABS: 0.2500 mg | ORAL_TABLET | Freq: Every day | ORAL | Status: DC | PRN

## 2011-11-26 MED ORDER — IBANDRONATE SODIUM 150 MG PO TABS: 150.0000 mg | ORAL_TABLET | ORAL | Status: DC

## 2011-11-26 MED ORDER — CHLORTHALIDONE 50 MG PO TABS: 50.0000 mg | ORAL_TABLET | Freq: Every day | ORAL | Status: DC | PRN

## 2011-11-26 MED ORDER — LEVOTHYROXINE SODIUM 125 MCG PO TABS: 125.0000 ug | ORAL_TABLET | Freq: Every day | ORAL | Status: DC

## 2011-11-26 NOTE — Telephone Encounter (Signed)
I signed them, they are done

## 2011-11-26 NOTE — Telephone Encounter (Signed)
Patient notified as instructed by telephone. Prescription left at front desk. Pt will pick up 11/28/11.

## 2011-11-26 NOTE — Telephone Encounter (Signed)
Pt request written rx for Alprazolam 0.25 mg, Bentyl 10 mg, Boniva 150 mg,Synthroid 125 mcg and Chlorthalidone 50 mg. Pt said only takes Chlorthalidone once a month if needed for swelling.  Pt was notified Dr Milinda Antis does not prefer  Alprazolam thru mail order pharmacy and pt said she will take to local pharmacy. Please call pt at home # when ready for pick up.

## 2011-11-29 ENCOUNTER — Encounter: Payer: Self-pay | Admitting: Family Medicine

## 2011-12-18 ENCOUNTER — Other Ambulatory Visit (INDEPENDENT_AMBULATORY_CARE_PROVIDER_SITE_OTHER): Payer: Medicare Other

## 2011-12-18 DIAGNOSIS — E7800 Pure hypercholesterolemia, unspecified: Secondary | ICD-10-CM

## 2011-12-18 DIAGNOSIS — E039 Hypothyroidism, unspecified: Secondary | ICD-10-CM

## 2011-12-18 LAB — LIPID PANEL
Cholesterol: 197 mg/dL (ref 0–200)
LDL Cholesterol: 117 mg/dL — ABNORMAL HIGH (ref 0–99)
Triglycerides: 141 mg/dL (ref 0.0–149.0)
VLDL: 28.2 mg/dL (ref 0.0–40.0)

## 2011-12-25 ENCOUNTER — Ambulatory Visit (INDEPENDENT_AMBULATORY_CARE_PROVIDER_SITE_OTHER): Payer: Medicare Other | Admitting: Family Medicine

## 2011-12-25 ENCOUNTER — Encounter: Payer: Self-pay | Admitting: Family Medicine

## 2011-12-25 ENCOUNTER — Other Ambulatory Visit (HOSPITAL_COMMUNITY)
Admission: RE | Admit: 2011-12-25 | Discharge: 2011-12-25 | Disposition: A | Discharge status: Home / Self Care | Payer: Medicare Other | Source: Ambulatory Visit | Attending: Family Medicine | Admitting: Family Medicine

## 2011-12-25 VITALS — BP 104/70 | HR 76 | Temp 97.5°F | Ht 63.5 in | Wt 140.8 lb

## 2011-12-25 DIAGNOSIS — Z01419 Encounter for gynecological examination (general) (routine) without abnormal findings: Secondary | ICD-10-CM | POA: Insufficient documentation

## 2011-12-25 DIAGNOSIS — Z1159 Encounter for screening for other viral diseases: Secondary | ICD-10-CM | POA: Insufficient documentation

## 2011-12-25 DIAGNOSIS — M810 Age-related osteoporosis without current pathological fracture: Secondary | ICD-10-CM

## 2011-12-25 DIAGNOSIS — Z1231 Encounter for screening mammogram for malignant neoplasm of breast: Secondary | ICD-10-CM

## 2011-12-25 DIAGNOSIS — E7800 Pure hypercholesterolemia, unspecified: Secondary | ICD-10-CM

## 2011-12-25 DIAGNOSIS — E039 Hypothyroidism, unspecified: Secondary | ICD-10-CM

## 2011-12-25 NOTE — Progress Notes (Signed)
Subjective:    Patient ID: Gabrielle Webb, female    DOB: [birth year], 71 y.o.   MRN: [id number]  HPI Here for check up of chronic medical conditions and to review health mt list    Dx with several skin cancers on her nose -- and they were removed - with Moh's  Is healing up  No other areas     bp good 104/70 Wt is down 3 lb with bmi of 24  Lipids are fairly controlled with diet Lab Results  Component Value Date   CHOL 197 12/18/2011   CHOL 228* 03/19/2011   CHOL 205* 12/08/2009   Lab Results  Component Value Date   HDL 51.60 12/18/2011   HDL 40.98 03/19/2011   HDL 11.91 12/08/2009   Lab Results  Component Value Date   LDLCALC 117* 12/18/2011   LDLCALC 127* 03/24/2007   Lab Results  Component Value Date   TRIG 141.0 12/18/2011   TRIG 85.0 03/19/2011   TRIG 78.0 12/08/2009   Lab Results  Component Value Date   CHOLHDL 4 12/18/2011   CHOLHDL 4 03/19/2011   CHOLHDL 3 12/08/2009   Lab Results  Component Value Date   LDLDIRECT 142.0 03/19/2011   LDLDIRECT 125.4 12/08/2009   LDLDIRECT 172.0 09/19/2009    Diet-- is excellent  Exercise- has not been able to exercise as much after her cancers  [facility]   Hypothyroid Lab Results  Component Value Date   TSH 9.02* 12/18/2011   high - has been missing some doses- as many as 2 a week No clinical changes   Hx of breast ca  Tamoxifen- gyn wants her to have a pap smear  On boniva for OP-- no problems with that --  dexa 12/12 was OP for spine and hip  Pap 10/10 Need pap for tamoxifen  No gyn symptoms    Mam 12/12 Self exam no lumps    Flu shot - in October at [facility] 4/04--- said 33 y follow  Did stool card last year   Patient Active Problem List  Diagnoses  . HYPOTHYROIDISM  . Pure Hypercholesterolemia  . ANXIETY  . ALLERGIC RHINITIS  . GERD  . DIVERTICULOSIS, COLON  . IBS  . FIBROCYSTIC BREAST DISEASE  . OSTEOPOROSIS  . Other screening mammogram  . Post-menopausal  .  Hx of Breast cancer, stage 1, Right, UOQ, Receptor+,Her2-  . Gynecological examination   Past Medical History  Diagnosis Date  . Allergy   . Anxiety   . GERD (gastroesophageal reflux disease)   . Osteoporosis   . Diverticulosis of colon   . Vertigo   . Hypothyroid   . Breast cancer 7/12   Past Surgical History  Procedure Date  . Breast biopsy   . Knee surgery 1962  . Appendectomy 07/2007  . Breast lumpectomy    History  Substance Use Topics  . Smoking status: Never Smoker   . Smokeless tobacco: Never Used  . Alcohol Use: No   Family History  Problem Relation Age of Onset  . Heart attack Father   . Hypertension Father   . Atrial fibrillation Mother   . Coronary artery disease Mother   . Diabetes      Grandmother  . Coronary artery disease      Grandmother  . Uterine cancer      Grandmother  . Leukemia      Aunt   Allergies  Allergen  Reactions  . Codeine Nausea And Vomiting  . Morphine Nausea And Vomiting   Current Outpatient Prescriptions on File Prior to Visit  Medication Sig Dispense Refill  . aspirin 81 MG tablet Take 81 mg by mouth daily.        . Calcium-Vitamin D-Vitamin K (VIACTIV FLAVOR GLIDES) 500-200-40 MG-UNT-MCG TABS Take 1 each by mouth 3 (three) times daily.        . Flaxseed, Linseed, (FLAX SEED OIL PO) Take 1 capsule by mouth daily.        Marland Kitchen ibandronate (BONIVA) 150 MG tablet Take 1 tablet (150 mg total) by mouth every 30 (thirty) days. Take in the morning with a full glass of water, on an empty stomach, and do not take anything else by mouth or lie down for the next 30 min.  3 tablet  3  . levothyroxine (SYNTHROID) 125 MCG tablet Take 1 tablet (125 mcg total) by mouth daily.  90 tablet  3  . Multiple Vitamin (MULTIVITAMIN) capsule Take 1 capsule by mouth daily.        . niacin 500 MG tablet Take 500 mg by mouth daily.        . Omega-3 Fatty Acids (FISH OIL PO) Take 1 capsule by mouth daily.        . tamoxifen (NOLVADEX) 20 MG tablet Take 1  tablet (20 mg total) by mouth daily.  90 tablet  3  . ALPRAZolam (XANAX) 0.25 MG tablet Take 1 tablet (0.25 mg total) by mouth daily as needed.  30 tablet  3  . chlorthalidone (HYGROTON) 50 MG tablet Take 1 tablet (50 mg total) by mouth daily as needed. For swelling.  90 tablet  3  . dicyclomine (BENTYL) 10 MG capsule Take 1 capsule (10 mg total) by mouth 2 (two) times daily as needed.  180 capsule  3  . fluticasone (FLOVENT DISKUS) 50 MCG/BLIST diskus inhaler Inhale 1 puff into the lungs daily.        . ranitidine (ZANTAC) 150 MG tablet Take 150 mg by mouth as needed.        Marland Kitchen VITAMIN A PO Take 1 capsule by mouth 2 (two) times daily.        . vitamin E 400 UNIT capsule Take 400 Units by mouth daily.              Review of Systems Review of Systems  Constitutional: Negative for fever, appetite change, fatigue and unexpected weight change.  Eyes: Negative for pain and visual disturbance.  Respiratory: Negative for cough and shortness of breath.   Cardiovascular: Negative for cp or palpitations    Gastrointestinal: Negative for nausea, diarrhea and constipation.  Genitourinary: Negative for urgency and frequency. some R breast soreness after surgery Skin: Negative for pallor or rash   MSK occ leg cramps from the tamoxifen  Neurological: Negative for weakness, light-headedness, numbness and headaches.  Hematological: Negative for adenopathy. Does not bruise/bleed easily.  Psychiatric/Behavioral: Negative for dysphoric mood. The patient is not nervous/anxious.          Objective:   Physical Exam  Constitutional: She appears well-developed and well-nourished. No distress.  HENT:  Head: Normocephalic and atraumatic.  Right Ear: External ear normal.  Left Ear: External ear normal.  Nose: Nose normal.  Mouth/Throat: Oropharynx is clear and moist.  Eyes: Conjunctivae and EOM are normal. Pupils are equal, round, and reactive to light. No scleral icterus.  Neck: Normal range of motion.  Neck supple. No JVD present.  Carotid bruit is not present. No thyromegaly present.  Cardiovascular: Normal rate, regular rhythm, normal heart sounds and intact distal pulses.  Exam reveals no gallop.   Pulmonary/Chest: Effort normal and breath sounds normal. No respiratory distress. She has no wheezes.  Abdominal: Soft. Bowel sounds are normal. She exhibits no distension, no abdominal bruit and no mass. There is no tenderness.  Genitourinary: Rectum normal and uterus normal. No breast swelling, tenderness, discharge or bleeding. There is no rash, tenderness or lesion on the right labia. There is no rash, tenderness or lesion on the left labia. Uterus is not enlarged and not tender. Cervix exhibits no motion tenderness, no discharge and no friability. Right adnexum displays no mass, no tenderness and no fullness. Left adnexum displays no mass, no tenderness and no fullness. No tenderness or bleeding around the vagina. No vaginal discharge found.       R breast- scars noted from lumpectomy in lateral breast and axilla- slightly tender Breast exam: No mass, nodules, thickening, tenderness, bulging, retraction, inflamation, nipple discharge or skin changes noted.  No axillary or clavicular LA.  Chaperoned exam.    o External genitalia nl appearing  o Urethral meatus nl appearing  o Urethra  slt mobile/ nt o Bladder nl  o Vagina no dc or bleeding or lesions o Cervix  Nl appearing  o Uterus  No tenderness or enl o Adnexa/parametria no M or tenderness o Anus and perineum nl appearing/ nl tone  Musculoskeletal: Normal range of motion. She exhibits no edema and no tenderness.  Lymphadenopathy:    She has no cervical adenopathy.  Neurological: She is alert. She has normal reflexes. No cranial nerve deficit. She exhibits normal muscle tone. Coordination normal.  Skin: Skin is warm and dry. No rash noted. No erythema. No pallor.       Bandages on nose/ R arm/ L shoulder and mid back - all healing excisions   Solar lentigos diffusely   Psychiatric: She has a normal mood and affect.       Cheerful and talkative           Assessment & Plan:

## 2011-12-25 NOTE — Assessment & Plan Note (Signed)
Ongoing fu for breast cancer going well and last mammo in dec was ok  Exam non remarkable today with scars present in R breast

## 2011-12-25 NOTE — Patient Instructions (Signed)
Will update you with pap smear result when we get it  Schedule non fasting lab in 1 month for thyroid Keep up good health habits  Get back to the gym when you are able

## 2011-12-25 NOTE — Assessment & Plan Note (Signed)
Pap done in light of tamoxifen No problems or concerning hx

## 2011-12-25 NOTE — Assessment & Plan Note (Signed)
Reviewed last dexa - OP in both spine and hip On tamoxifen for breast cancer On boniva now -tolerating well  Rev ca and D intake Ready to start back with exercise too

## 2011-12-25 NOTE — Assessment & Plan Note (Signed)
tsh mildly high but pt admits to missing doses more than once per week  Will be more compliant about daily dosing and re check tsh 1 mo  If needed at that time will inc dose No clinical changes or symptoms

## 2011-12-25 NOTE — Assessment & Plan Note (Signed)
This is improved  Disc goals for lipids and reasons to control them Rev labs with pt Rev low sat fat diet in detail  She is ready to start exercising again to get HDL up  Tamoxifen may also affect it

## 2012-01-03 ENCOUNTER — Encounter: Payer: Self-pay | Admitting: *Deleted

## 2012-01-21 ENCOUNTER — Other Ambulatory Visit: Payer: Medicare Other

## 2012-01-24 ENCOUNTER — Other Ambulatory Visit (INDEPENDENT_AMBULATORY_CARE_PROVIDER_SITE_OTHER): Payer: Medicare Other

## 2012-01-24 DIAGNOSIS — E039 Hypothyroidism, unspecified: Secondary | ICD-10-CM

## 2012-01-24 LAB — TSH: TSH: 1.41 u[IU]/mL (ref 0.35–5.50)

## 2012-02-01 ENCOUNTER — Encounter (INDEPENDENT_AMBULATORY_CARE_PROVIDER_SITE_OTHER): Payer: Self-pay | Admitting: General Surgery

## 2012-02-01 ENCOUNTER — Telehealth: Payer: Self-pay | Admitting: *Deleted

## 2012-02-01 ENCOUNTER — Ambulatory Visit (INDEPENDENT_AMBULATORY_CARE_PROVIDER_SITE_OTHER): Payer: MEDICARE | Admitting: General Surgery

## 2012-02-01 VITALS — BP 128/74 | HR 80 | Temp 98.3°F | Resp 16 | Ht 65.0 in | Wt 143.4 lb

## 2012-02-01 DIAGNOSIS — N61 Mastitis without abscess: Secondary | ICD-10-CM

## 2012-02-01 MED ORDER — DOXYCYCLINE HYCLATE 100 MG PO TABS: 100.0000 mg | ORAL_TABLET | Freq: Two times a day (BID) | ORAL | Status: AC

## 2012-02-01 NOTE — Patient Instructions (Signed)
Start Doxycycline today Call if redness worsens

## 2012-02-01 NOTE — Progress Notes (Signed)
Subjective:     Patient ID: Gabrielle Webb, female   DOB: [birth year], 71 y.o.   MRN: [id number]  HPI The patient is a 71 year old white female who is a breast cancer patient of Dr. Jamey Ripa. She is about 9 months out from a right lumpectomy and sentinel node biopsy. She states that she was stretching yesterday and felt a twinge in the right breast. Since then she has developed redness and tenderness of the right breast. She has had no discharge from her nipple.  Review of Systems  Constitutional: Negative.   HENT: Negative.   Eyes: Negative.   Respiratory: Negative.   Cardiovascular: Negative.   Gastrointestinal: Negative.   Genitourinary: Negative.   Musculoskeletal: Negative.   Skin: Positive for color change.  Neurological: Negative.   Hematological: Negative.   Psychiatric/Behavioral: Negative.        Objective:   Physical Exam  Constitutional: She is oriented to person, place, and time. She appears well-developed and well-nourished.  HENT:  Head: Normocephalic and atraumatic.  Eyes: Conjunctivae and EOM are normal. Pupils are equal, round, and reactive to light.  Neck: Normal range of motion. Neck supple.  Cardiovascular: Normal rate, regular rhythm and normal heart sounds.   Pulmonary/Chest: Effort normal and breath sounds normal.       Her right breast and axillary incisions have healed nicely. She has some significant redness laterally in the right breast near her lumpectomy incision. There does not appear to be any fluctuance. We performed an ultrasound on the area and the only fluid we could find was about 6 mm wide  Abdominal: Soft. Bowel sounds are normal. She exhibits no mass. There is no tenderness.  Musculoskeletal: Normal range of motion.  Lymphadenopathy:    She has no cervical adenopathy.  Neurological: She is alert and oriented to person, place, and time.  Skin: Skin is warm and dry.  Psychiatric: She has a normal mood and affect. Her behavior is normal.        Assessment:     Cellulitis of the right breast 9 months after lumpectomy and sentinel node mapping    Plan:     We will start her on doxycycline today and I would like her to be seen either by Dr. Jamey Ripa or myself on Monday to check her progress. She agrees to call the doctor on call of the redness gets worse.

## 2012-02-01 NOTE — Telephone Encounter (Signed)
INSTRUCTED PT. TO CALL HER SURGEON, DR.STRECK. PHYSICIAN'S OFFICE NUMBER GIVEN TO PT. SHE VOICES UNDERSTANDING.

## 2012-02-04 ENCOUNTER — Encounter (INDEPENDENT_AMBULATORY_CARE_PROVIDER_SITE_OTHER): Payer: Self-pay | Admitting: Surgery

## 2012-02-04 ENCOUNTER — Ambulatory Visit (INDEPENDENT_AMBULATORY_CARE_PROVIDER_SITE_OTHER): Payer: MEDICARE | Admitting: Surgery

## 2012-02-04 ENCOUNTER — Other Ambulatory Visit (INDEPENDENT_AMBULATORY_CARE_PROVIDER_SITE_OTHER): Payer: Self-pay | Admitting: Surgery

## 2012-02-04 DIAGNOSIS — L539 Erythematous condition, unspecified: Secondary | ICD-10-CM

## 2012-02-04 DIAGNOSIS — N61 Mastitis without abscess: Secondary | ICD-10-CM

## 2012-02-04 NOTE — Patient Instructions (Signed)
Continue the antibiotic. If this doesn't improve over the next few days, call us Thursday. I want to see you next week

## 2012-02-04 NOTE — Progress Notes (Signed)
The patient comes in for followup of a cellulitis in her right breast which originated near her lumpectomy site. She was started on some antibiotics when she was seen in our office last week. She has been taking them for about three days now, doxycycline 100 mg p.o. b.i.d.  She notes that the lumpectomy site continues to feel hard. However is less painful and the redness seemed to be decreasing. She has not had any fevers or chills.  Exam: The lumpectomy site is bright red and firm. There is some mild erythema extending medially. There is no obvious fluctuance. I aspirated the area of the lumpectomy and got about 1 cc of thin slightly cloudy fluid. There is a significant collection and there was no frank pus present.  Impression: Cellulitis of right breast and lumpectomy site  Plan: She will start on some warm compresses. She'll continue the antibiotics. I will check her again next week. If she is not improving she will call us in three days to let us know.

## 2012-02-07 ENCOUNTER — Telehealth (INDEPENDENT_AMBULATORY_CARE_PROVIDER_SITE_OTHER): Payer: Self-pay | Admitting: General Surgery

## 2012-02-07 NOTE — Telephone Encounter (Signed)
Message copied by Liliana Cline on Thu Feb 07, 2012 11:00 AM ------      Message from: [place], [place]      Created: Mon Feb 04, 2012 12:01 PM       Check on culture.      Patient to call Thursday if breast no better (redness)

## 2012-02-07 NOTE — Telephone Encounter (Signed)
Called patient to check on her breast. She states the redness is much better. She still does have a small amount of redness but this is only sore at site we aspirated fluid. She has two pills left on her antibiotics but does have a refill. Since this is still red I told her to refill this antibiotic and continue taking until Tuesday when we see her. I made her aware that we just have a preliminary report and this is showing a small amount of staph at this point. Made her aware we will call her when the final report comes in.

## 2012-02-08 ENCOUNTER — Telehealth (INDEPENDENT_AMBULATORY_CARE_PROVIDER_SITE_OTHER): Payer: Self-pay

## 2012-02-08 LAB — WOUND CULTURE: Gram Stain: NONE SEEN

## 2012-02-08 NOTE — Telephone Encounter (Signed)
Called pt to notify her that we did receive final wound culture and I showed the results to Dr Maud Deed. Dr Jamey Ripa advised that pt was on the right antibiotic of Doxycycline. The pt understands and just wants a copy of results when she comes in for her appt. With Dr Jamey Ripa.

## 2012-02-12 ENCOUNTER — Ambulatory Visit (INDEPENDENT_AMBULATORY_CARE_PROVIDER_SITE_OTHER): Payer: MEDICARE | Admitting: Surgery

## 2012-02-12 ENCOUNTER — Encounter (INDEPENDENT_AMBULATORY_CARE_PROVIDER_SITE_OTHER): Payer: Self-pay | Admitting: Surgery

## 2012-02-12 VITALS — BP 102/65 | HR 71 | Temp 97.6°F | Ht 65.0 in | Wt 142.8 lb

## 2012-02-12 DIAGNOSIS — N61 Mastitis without abscess: Secondary | ICD-10-CM

## 2012-02-12 NOTE — Progress Notes (Signed)
The patient comes in for followup of a cellulitis in her right breast which originated near her lumpectomy site. She was started on some antibiotics when she was seen in our office last week. Shes has taken about 10 days so far.  She notes that the lumpectomy site continues to feel firm. It is almost back to baselilne  Exam: The lumpectomy site is slightly firm, but no fluctuance and no erythema and not tender  Data:  C&S showed coag neg staph  Impression: Cellulitis of right breast and lumpectomy site, improved on antibiotic  Plan: She will finish two weeks of antibiotics and I will see her again in about a month

## 2012-02-12 NOTE — Patient Instructions (Signed)
Finish the second week of antibiotics and see me in a month. If the redness recurs, start back on the antibiotic and come back to see me

## 2012-02-14 ENCOUNTER — Ambulatory Visit
Admission: RE | Admit: 2012-02-14 | Discharge: 2012-02-14 | Disposition: A | Discharge status: Home / Self Care | Payer: Medicare Other | Source: Ambulatory Visit | Attending: Radiation Oncology | Admitting: Radiation Oncology

## 2012-02-14 ENCOUNTER — Encounter: Payer: Self-pay | Admitting: Radiation Oncology

## 2012-02-14 VITALS — BP 98/61 | HR 75 | Temp 97.1°F | Resp 18 | Wt 142.1 lb

## 2012-02-14 DIAGNOSIS — C50919 Malignant neoplasm of unspecified site of unspecified female breast: Secondary | ICD-10-CM

## 2012-02-14 HISTORY — DX: Cellulitis, unspecified: L03.90

## 2012-02-14 NOTE — Progress Notes (Signed)
BEING TREATED FOR CELLULITIS OF RIGHT BREAST BY DR. Jamey Ripa IS TAKING DOXYCYCLINE FOR THIS.  SKIN LOOKS GOOD TODAY, FEELS HARD BUT SHE SAYS THE TISSUE LOOKS REAL GOOD COMPARE TO WHAT IT WAS.  HAS FU WITH DR. Jamey Ripa April 11

## 2012-02-14 NOTE — Progress Notes (Signed)
CC:   Currie Paris, M.D. Pierce Crane, M.D., F.R.C.P.C.  DIAGNOSIS:  T1 N0 right breast cancer, radiation to a total dose of 42.72 completed 07/11/2011.  INTERVAL SINCE TREATMENT:  6 months.  INTERVAL HISTORY:  Annalyce reports for regular followup today.  She unfortunately developed a cellulitis in her seroma cavity.  She saw one of Dr. Tenna Child partners last week and then him this week.  She is responding well to doxycycline.  She has no fever and no chills.  Her breast is still a little bit hard.  He has not had to drain any fluid out of her breast since the 4th.  She is scheduled for mammograms in June.  She is taking her tamoxifen under the care of Dr. Donnie Coffin and has a followup appointment scheduled with him in May.  She has no other breast- related complaints.  PHYSICAL EXAMINATION:  General:  She is a pleasant female in no distress sitting comfortably on the exam room table.  Vital signs:  Weight 142 pounds, blood pressure 98/61, pulse 75, temperature 97.1.  Breasts:  Her right breast has a seroma cavity palpable underneath the scar.  No other palpable abnormalities.  No palpable abnormalities of the left breast. No palpable axillary adenopathy.  IMPRESSION:  T1 N0 breast cancer.  RECOMMENDATIONS:  She looks great.  I am glad her cellulitis has resolved.  Incidentally her right arm pain has resolved as well.  She really looks well.  We talked about moving her mammogram up, but I think with the cellulitis picture, we will just let it go until June.  I offered to release her from followup with me, but she would like to be seen again.  I have given her followup visit in September.    ______________________________ Lurline Hare, M.D. SW/MEDQ  D:  02/14/2012  T:  02/14/2012  Job:  [id number]

## 2012-03-13 ENCOUNTER — Ambulatory Visit (INDEPENDENT_AMBULATORY_CARE_PROVIDER_SITE_OTHER): Payer: Medicare Other | Admitting: Surgery

## 2012-03-13 ENCOUNTER — Encounter (INDEPENDENT_AMBULATORY_CARE_PROVIDER_SITE_OTHER): Payer: Self-pay | Admitting: Surgery

## 2012-03-13 VITALS — BP 90/62 | HR 76 | Temp 97.5°F | Resp 16 | Ht 65.0 in | Wt 142.0 lb

## 2012-03-13 DIAGNOSIS — N61 Mastitis without abscess: Secondary | ICD-10-CM

## 2012-03-13 NOTE — Patient Instructions (Signed)
Check with Dr Donnie Coffin about when to see me again. Probably it should be in about six months. Call if you have any recurrence of the breast symptoms

## 2012-03-13 NOTE — Progress Notes (Signed)
Chief complaint: Followup cellulitis/mastitis right breast status post lipectomy radiation  History of present illness: This patient developed cellulitis of the right breast which was treated with antibiotics. She comes in for followup having been off antibiotics a couple of weeks. She overall feels well and is not having any symptoms. She thinks her breasts back to his baseline normal.  Exam: Vital signs: Breasts: Right breast appears normal postsurgical changes. There is no evidence of cellulitis or infection today.  Impression: Resolving cellulitis right breast  Plan: RTC p.r.n. if she has further breast symptoms. She will discuss with her oncologist timing for my next visit, most likely about six months from now.

## 2012-03-19 ENCOUNTER — Telehealth: Payer: Self-pay | Admitting: *Deleted

## 2012-03-19 ENCOUNTER — Other Ambulatory Visit: Payer: Medicare Other | Admitting: Lab

## 2012-03-19 ENCOUNTER — Ambulatory Visit (HOSPITAL_BASED_OUTPATIENT_CLINIC_OR_DEPARTMENT_OTHER): Payer: Medicare Other | Admitting: Lab

## 2012-03-19 ENCOUNTER — Ambulatory Visit (HOSPITAL_BASED_OUTPATIENT_CLINIC_OR_DEPARTMENT_OTHER): Payer: Medicare Other | Admitting: Oncology

## 2012-03-19 VITALS — BP 110/73 | HR 87 | Temp 98.0°F | Ht 65.0 in | Wt 140.4 lb

## 2012-03-19 DIAGNOSIS — M810 Age-related osteoporosis without current pathological fracture: Secondary | ICD-10-CM

## 2012-03-19 DIAGNOSIS — C50919 Malignant neoplasm of unspecified site of unspecified female breast: Secondary | ICD-10-CM

## 2012-03-19 DIAGNOSIS — Z170 Estrogen receptor positive status [ER+]: Secondary | ICD-10-CM

## 2012-03-19 DIAGNOSIS — C50419 Malignant neoplasm of upper-outer quadrant of unspecified female breast: Secondary | ICD-10-CM

## 2012-03-19 LAB — CBC WITH DIFFERENTIAL/PLATELET
BASO%: 0.4 % (ref 0.0–2.0)
LYMPH%: 29.7 % (ref 14.0–49.7)
MCHC: 34 g/dL (ref 31.5–36.0)
MCV: 90.4 fL (ref 79.5–101.0)
MONO#: 0.5 10*3/uL (ref 0.1–0.9)
MONO%: 9 % (ref 0.0–14.0)
Platelets: 172 10*3/uL (ref 145–400)
RBC: 4.36 10*6/uL (ref 3.70–5.45)
WBC: 5.7 10*3/uL (ref 3.9–10.3)
nRBC: 0 % (ref 0–0)

## 2012-03-19 NOTE — Telephone Encounter (Signed)
placed patient on the walk -in list for the lab on 03-19-2012

## 2012-03-19 NOTE — Telephone Encounter (Signed)
gave patient appointment for 09-2012 printed out calendar and gave to the patient 

## 2012-03-19 NOTE — Progress Notes (Signed)
Hematology and Oncology Follow Up Visit  Gabrielle Webb [id number] 12-28-1940 71 y.o. 03/19/2012 9:32 AM PCP Dr  Jamey Ripa Principle Diagnosis: T1CN) N- er/pr+ breast cancer s/p lumpectomy 05/16/11, s/p xrt completed 07/11/11, on tamoxifen Hx of osteoperosis on Boniva  Interim History:  There have been no intercurrent illness, hospitalizations or medication changes. She has developed cellulitis of the treated breast, had a number of skin cancer removed, she also had a bug bite and has been scratching quite a bit. She has completed a course of doxycycline recently.  Medications: I have reviewed the patient's current medications.  Allergies:  Allergies  Allergen Reactions  . Codeine Nausea And Vomiting  . Morphine Nausea And Vomiting    Past Medical History, Surgical history, Social history, and Family History were reviewed and updated.  Review of Systems: Constitutional:  Negative for fever, chills, night sweats, anorexia, weight loss, pain. Cardiovascular: no chest pain or dyspnea on exertion Respiratory: no cough, shortness of breath, or wheezing Neurological: negative Dermatological: negative ENT: negative Skin Gastrointestinal: negative Genito-Urinary: negative Hematological and Lymphatic: negative Breast: negative Musculoskeletal: negative Remaining ROS negative.  Physical Exam: Blood pressure 110/73, pulse 87, temperature 98 F (36.7 C), temperature source Oral, height 5\' 5"  (1.651 m), weight 140 lb 6.4 oz (63.685 kg), last menstrual period 12/03/1988. ECOG: 0 General appearance: alert, cooperative and appears stated age Head: Normocephalic, without obvious abnormality, atraumatic Neck: no adenopathy, no carotid bruit, no JVD, supple, symmetrical, trachea midline and thyroid not enlarged, symmetric, no tenderness/mass/nodules Lymph nodes: Cervical, supraclavicular, and axillary nodes normal. Cardiac : regular rate and rhythm, no murmurs or gallops Pulmonary:clear to  auscultation bilaterally and normal percussion bilaterally Breasts: inspection negative, no nipple discharge or bleeding, no masses or nodularity palpable, some excoriated areas on chest, unlikely to be shingles. Abdomen:soft, non-tender; bowel sounds normal; no masses,  no organomegaly Extremities negative Neuro: alert, oriented, normal speech, no focal findings or movement disorder noted  Lab Results: Lab Results  Component Value Date   WBC 5.8 09/26/2011   HGB 12.8 09/26/2011   HCT 37.3 09/26/2011   MCV 88.7 09/26/2011   PLT 175 09/26/2011     Chemistry      Component Value Date/Time   NA 143 09/26/2011 1152   NA 143 09/26/2011 1152   K 4.2 09/26/2011 1152   K 4.2 09/26/2011 1152   CL 108 09/26/2011 1152   CL 108 09/26/2011 1152   CO2 26 09/26/2011 1152   CO2 26 09/26/2011 1152   BUN 18 09/26/2011 1152   BUN 18 09/26/2011 1152   CREATININE 0.79 09/26/2011 1152   CREATININE 0.79 09/26/2011 1152      Component Value Date/Time   CALCIUM 8.4 09/26/2011 1152   CALCIUM 8.4 09/26/2011 1152   ALKPHOS 44 09/26/2011 1152   ALKPHOS 44 09/26/2011 1152   AST 16 09/26/2011 1152   AST 16 09/26/2011 1152   ALT 12 09/26/2011 1152   ALT 12 09/26/2011 1152   BILITOT 0.3 09/26/2011 1152   BILITOT 0.3 09/26/2011 1152      .pathology. Radiological Studies: chest X-ray n/a Mammogram F/u 8/13 Bone density Last checked .Marland Kitchen osteoperosis  Impression and Plan: 63-year-old woman with history of node-negative, ER/PR positive breast cancer on tamoxifen. She'll remain on tamoxifen until we recheck a bone density test in one year. I will see her in 6 months time for followup. We will alternate visits with Dr. Jamey Ripa.  Pierce Crane M.D. FRCP C..  More than 50% of the visit was spent  in patient-related counselling   Pierce Crane, MD 4/17/20139:32 AM

## 2012-03-20 LAB — COMPREHENSIVE METABOLIC PANEL
ALT: 12 U/L (ref 0–35)
AST: 18 U/L (ref 0–37)
Alkaline Phosphatase: 35 U/L — ABNORMAL LOW (ref 39–117)
Creatinine, Ser: 0.75 mg/dL (ref 0.50–1.10)
Total Bilirubin: 0.5 mg/dL (ref 0.3–1.2)

## 2012-04-08 ENCOUNTER — Other Ambulatory Visit: Payer: Self-pay | Admitting: Family Medicine

## 2012-04-08 DIAGNOSIS — Z853 Personal history of malignant neoplasm of breast: Secondary | ICD-10-CM

## 2012-05-05 ENCOUNTER — Ambulatory Visit
Admission: RE | Admit: 2012-05-05 | Discharge: 2012-05-05 | Disposition: A | Discharge status: Home / Self Care | Payer: Medicare Other | Source: Ambulatory Visit | Attending: Family Medicine | Admitting: Family Medicine

## 2012-05-05 DIAGNOSIS — Z853 Personal history of malignant neoplasm of breast: Secondary | ICD-10-CM

## 2012-07-08 ENCOUNTER — Other Ambulatory Visit: Payer: Self-pay | Admitting: *Deleted

## 2012-07-08 MED ORDER — ALPRAZOLAM 0.25 MG PO TABS: 0.2500 mg | ORAL_TABLET | Freq: Every day | ORAL | Status: DC | PRN

## 2012-07-08 NOTE — Telephone Encounter (Signed)
Medication phoned to pharmacy.  

## 2012-07-08 NOTE — Telephone Encounter (Signed)
Px written for call in   

## 2012-08-21 ENCOUNTER — Ambulatory Visit: Payer: Medicare Other | Admitting: Radiation Oncology

## 2012-08-28 ENCOUNTER — Encounter: Payer: Self-pay | Admitting: Radiation Oncology

## 2012-08-28 ENCOUNTER — Ambulatory Visit
Admission: RE | Admit: 2012-08-28 | Discharge: 2012-08-28 | Disposition: A | Discharge status: Home / Self Care | Payer: Medicare Other | Source: Ambulatory Visit | Attending: Radiation Oncology | Admitting: Radiation Oncology

## 2012-08-28 VITALS — BP 113/59 | HR 67 | Temp 97.6°F | Resp 18 | Wt 143.4 lb

## 2012-08-28 DIAGNOSIS — C50919 Malignant neoplasm of unspecified site of unspecified female breast: Secondary | ICD-10-CM

## 2012-08-28 NOTE — Progress Notes (Signed)
Department of Radiation Oncology  Phone:  [phone] Fax:        [phone]   Name: Gabrielle Webb   DOB: 1941/01/20  MRN: [id number]    Date: 08/28/2012  Follow Up Visit Note  Diagnosis: T1N0 right breast cancer s/p breast conservation (finished RT 05/2011). Now on Tamoxifen  Interval since last radiation: 1 year  Interval History: Gabrielle Webb presents today for routine followup.  Gabrielle Webb is tolerating Gabrielle Webb tamoxifen well. Gabrielle Webb has noted about a 2/10 right breast pain and soreness since Gabrielle Webb mammogram in June. Gabrielle Webb states that Gabrielle Webb had the new mammogram and that required more pressure than Gabrielle Webb was used to. Gabrielle Webb doesn't have to take any pain medications for this and states it occasionally is just more sore than other times. Gabrielle Webb states the pain usually is in around Gabrielle Webb tumor cavity especially when Gabrielle Webb is performing Gabrielle Webb self breast examinations. Gabrielle Webb's noticed no new palpable differences. No skin changes or nipple changes.  Allergies:  Allergies  Allergen Reactions  . Codeine Nausea And Vomiting  . Morphine Nausea And Vomiting    Medications:  Current Outpatient Prescriptions  Medication Sig Dispense Refill  . ALPRAZolam (XANAX) 0.25 MG tablet Take 1 tablet (0.25 mg total) by mouth daily as needed.  30 tablet  3  . aspirin 81 MG tablet Take 81 mg by mouth daily.        . Calcium-Vitamin D-Vitamin K (VIACTIV FLAVOR GLIDES) 500-200-40 MG-UNT-MCG TABS Take 1 each by mouth 3 (three) times daily.        . chlorthalidone (HYGROTON) 50 MG tablet Take 1 tablet (50 mg total) by mouth daily as needed. For swelling.  90 tablet  3  . Cholecalciferol (VITAMIN D) 2000 UNITS CAPS Take 1 capsule by mouth daily.      Marland Kitchen dicyclomine (BENTYL) 10 MG capsule Take 1 capsule (10 mg total) by mouth 2 (two) times daily as needed.  180 capsule  3  . Flaxseed, Linseed, (FLAX SEED OIL PO) Take 1 capsule by mouth daily.        Marland Kitchen ibandronate (BONIVA) 150 MG tablet Take 1 tablet (150 mg total) by mouth every 30 (thirty)  days. Take in the morning with a full glass of water, on an empty stomach, and do not take anything else by mouth or lie down for the next 30 min.  3 tablet  3  . levothyroxine (SYNTHROID) 125 MCG tablet Take 1 tablet (125 mcg total) by mouth daily.  90 tablet  3  . Multiple Vitamin (MULTIVITAMIN) capsule Take 1 capsule by mouth daily.        . niacin 500 MG tablet Take 500 mg by mouth daily.        . ranitidine (ZANTAC) 150 MG tablet Take 150 mg by mouth as needed.        . tamoxifen (NOLVADEX) 20 MG tablet Take 1 tablet (20 mg total) by mouth daily.  90 tablet  3  . vitamin E 400 UNIT capsule Take 400 Units by mouth daily.        Marland Kitchen gentamicin ointment (GARAMYCIN) 0.1 %       . Omega-3 Fatty Acids (FISH OIL PO) Take 1 capsule by mouth daily.        Marland Kitchen sulfamethoxazole-trimethoprim (BACTRIM DS) 800-160 MG per tablet         Physical Exam:   weight is 143 lb 6.4 oz (65.046 kg). Gabrielle Webb oral temperature is 97.6 F (36.4 C). Gabrielle Webb blood pressure is 113/59  and Gabrielle Webb pulse is 67. Gabrielle Webb respiration is 18.  Gabrielle Webb is a pleasant female in no distress who appears younger than Gabrielle Webb stated age sitting comfortably examining table. Gabrielle Webb does have some tenderness to palpation over a platelike area of scar tissue in the upper outer quadrant near Gabrielle Webb scar. There is no visible or palpable evidence of disease recurrence. No palpable abnormalities the left breast. Gabrielle Webb has no axillary adenopathy bilaterally.  IMPRESSION: Gabrielle Webb is a 71 y.o. female status post hypofractionated radiation for an early stage right breast cancer  PLAN:  Gabrielle Webb looks great. Gabrielle Webb just has some of this mild postoperative pain. I told Gabrielle Webb to try 3 or 4 days of an anti-inflammatory to see if Gabrielle Webb could get a calm down. We discussed self-care as Gabrielle Webb is taking care of Gabrielle Webb elderly mother. Gabrielle Webb will continue on Gabrielle Webb tamoxifen. Gabrielle Webb has appointment Dr. Donnie Coffin in October an appointment Dr. Jamey Ripa in November. I will see Gabrielle Webb back in 6 months.    Lurline Hare,  MD

## 2012-08-28 NOTE — Progress Notes (Signed)
Patient presents to the clinic today unaccompanied for a follow up appointment with Dr. Michell Heinrich. Patient is alert and oriented to person, place, and time. No distress noted. Steady gait noted. Patient denies pain at this time. However, patient does reports intermittent soreness mostly under right arm and across the top her right breast. Patient denies any decrease in ROM of the right arm. Patient denies swelling or numbness in the right arm. Patient denies nipple discharge. Patient denies nausea, vomiting, headache, dizziness or diarrhea. Patient reports a good appetite with stable weight. Patient reports sleeping without difficulty. Reported all findings to Dr. Michell Heinrich.

## 2012-09-11 ENCOUNTER — Telehealth: Payer: Self-pay | Admitting: Family Medicine

## 2012-09-11 NOTE — Telephone Encounter (Signed)
Left voicemail requesting pt to call office 

## 2012-09-11 NOTE — Telephone Encounter (Signed)
Caller: Meilani/Patient; Patient Name: Gabrielle Webb; PCP: Roxy Manns [facility]); Best Callback Phone Number: [phone]; Reason for call: Urinary Pain. Onset 09/11/12 [name] states she is having frequency, burning with urination.  Afebrile.  All emergent symptoms ruled out per Urinary Symptoms - Female protocol with excpetion " Has one or more urinary tract symptoms and has not been previously evaluated."  Home care advice given.  Per disposition see provider within 24 hr,no available appt. with Dr Milinda Antis (and patient prefers to see her) PLEASE F/U WITH PATIENT TO SCHEDULE APPT. WITH DR Milinda Antis.  THANK YOU.

## 2012-09-12 ENCOUNTER — Ambulatory Visit: Payer: Medicare Other | Admitting: Family Medicine

## 2012-09-12 ENCOUNTER — Telehealth: Payer: Self-pay | Admitting: Family Medicine

## 2012-09-12 ENCOUNTER — Encounter: Payer: Self-pay | Admitting: Family Medicine

## 2012-09-12 ENCOUNTER — Ambulatory Visit (INDEPENDENT_AMBULATORY_CARE_PROVIDER_SITE_OTHER): Payer: Medicare Other | Admitting: Family Medicine

## 2012-09-12 VITALS — BP 106/68 | HR 80 | Temp 99.0°F | Ht 65.0 in | Wt 143.8 lb

## 2012-09-12 DIAGNOSIS — R319 Hematuria, unspecified: Secondary | ICD-10-CM

## 2012-09-12 DIAGNOSIS — N390 Urinary tract infection, site not specified: Secondary | ICD-10-CM

## 2012-09-12 LAB — CBC WITH DIFFERENTIAL/PLATELET
Basophils Relative: 0.3 % (ref 0.0–3.0)
Eosinophils Relative: 0.2 % (ref 0.0–5.0)
HCT: 37.2 % (ref 36.0–46.0)
Lymphs Abs: 1.6 10*3/uL (ref 0.7–4.0)
MCV: 92.8 fl (ref 78.0–100.0)
Monocytes Absolute: 0.5 10*3/uL (ref 0.1–1.0)
Neutrophils Relative %: 81 % — ABNORMAL HIGH (ref 43.0–77.0)
RBC: 4 Mil/uL (ref 3.87–5.11)
WBC: 11.3 10*3/uL — ABNORMAL HIGH (ref 4.5–10.5)

## 2012-09-12 LAB — POCT URINALYSIS DIPSTICK
Glucose, UA: NEGATIVE
Ketones, UA: NEGATIVE
Spec Grav, UA: 1.025

## 2012-09-12 MED ORDER — CIPROFLOXACIN HCL 250 MG PO TABS: 250.0000 mg | ORAL_TABLET | Freq: Two times a day (BID) | ORAL | Status: DC

## 2012-09-12 NOTE — Telephone Encounter (Signed)
I will see her later today Please cx urine  Please call in cipro  Update if worse in meantime-enc water intake

## 2012-09-12 NOTE — Assessment & Plan Note (Signed)
In light of tamoxifen check cbc as this can aff platelets tx uti

## 2012-09-12 NOTE — Telephone Encounter (Signed)
Appt set for 4:15 today

## 2012-09-12 NOTE — Telephone Encounter (Signed)
Gabrielle Webb walked in this am and stated that she was peeing pure blood. She said she was taking a pill named Tamoxifen 20 1 x a day for breast cancer.

## 2012-09-12 NOTE — Patient Instructions (Addendum)
I think you have a urinary tract infection - bladder infection  We will send your urine for a culture and update you  Drink lots of water Finish the antibiotic If symptoms worsen -let me know , or if severe back pain or nausea Lab today for cbc

## 2012-09-12 NOTE — Progress Notes (Signed)
Subjective:    Patient ID: Gabrielle Webb, female    DOB: 06/03/1941, 71 y.o.   MRN: [id number]  HPI Here for gross hematuria  Yesterday am woke up to urinate and she had dicomfort/ burning  This am - straight blood  Is also on asa  Urine dips pos for blood and leuk Temp is 99 also   Now she has a cold chill when she urinates  Has urgency and freqency  Back bothers her a bit   Was told by oncol that tamoxifen could cause problems    Sent for cx already  Patient Active Problem List  Diagnosis  . HYPOTHYROIDISM  . Pure Hypercholesterolemia  . ANXIETY  . ALLERGIC RHINITIS  . GERD  . DIVERTICULOSIS, COLON  . IBS  . FIBROCYSTIC BREAST DISEASE  . OSTEOPOROSIS  . Other screening mammogram  . Post-menopausal  . Hx of Breast cancer, stage 1, Right, UOQ, Receptor+,Her2-  . Gynecological examination  . Breast cancer  . Hematuria  . UTI (lower urinary tract infection)   Past Medical History  Diagnosis Date  . Allergy   . Anxiety   . GERD (gastroesophageal reflux disease)   . Osteoporosis   . Diverticulosis of colon   . Vertigo   . Hypothyroid   . Breast cancer 7/12    Right  . Skin cancer     basal and squamous cell  . Cellulitis    Past Surgical History  Procedure Date  . Breast biopsy   . Knee surgery 1962  . Appendectomy 07/2007  . Breast lumpectomy    History  Substance Use Topics  . Smoking status: Never Smoker   . Smokeless tobacco: Never Used  . Alcohol Use: No   Family History  Problem Relation Age of Onset  . Heart attack Father   . Hypertension Father   . Atrial fibrillation Mother   . Coronary artery disease Mother   . Diabetes      Grandmother  . Coronary artery disease      Grandmother  . Uterine cancer      Grandmother  . Leukemia      Aunt  . Cancer Maternal Aunt     leukemia  . Cancer Maternal Uncle     colon   Allergies  Allergen Reactions  . Codeine Nausea And Vomiting  . Morphine Nausea And Vomiting   Current  Outpatient Prescriptions on File Prior to Visit  Medication Sig Dispense Refill  . ALPRAZolam (XANAX) 0.25 MG tablet Take 1 tablet (0.25 mg total) by mouth daily as needed.  30 tablet  3  . aspirin 81 MG tablet Take 81 mg by mouth 2 (two) times daily.       . Calcium-Vitamin D-Vitamin K (VIACTIV FLAVOR GLIDES) 500-200-40 MG-UNT-MCG TABS Take 1 each by mouth 3 (three) times daily.        . chlorthalidone (HYGROTON) 50 MG tablet Take 1 tablet (50 mg total) by mouth daily as needed. For swelling.  90 tablet  3  . Cholecalciferol (VITAMIN D) 2000 UNITS CAPS Take 1 capsule by mouth daily.      . ciprofloxacin (CIPRO) 250 MG tablet Take 1 tablet (250 mg total) by mouth 2 (two) times daily.  14 tablet  0  . dicyclomine (BENTYL) 10 MG capsule Take 1 capsule (10 mg total) by mouth 2 (two) times daily as needed.  180 capsule  3  . Flaxseed, Linseed, (FLAX SEED OIL PO) Take 1 capsule by mouth daily.        Marland Kitchen  gentamicin ointment (GARAMYCIN) 0.1 %       . ibandronate (BONIVA) 150 MG tablet Take 1 tablet (150 mg total) by mouth every 30 (thirty) days. Take in the morning with a full glass of water, on an empty stomach, and do not take anything else by mouth or lie down for the next 30 min.  3 tablet  3  . levothyroxine (SYNTHROID) 125 MCG tablet Take 1 tablet (125 mcg total) by mouth daily.  90 tablet  3  . Multiple Vitamin (MULTIVITAMIN) capsule Take 1 capsule by mouth daily.        . niacin 500 MG tablet Take 500 mg by mouth daily.        . Omega-3 Fatty Acids (FISH OIL PO) Take 1 capsule by mouth daily.        . ranitidine (ZANTAC) 150 MG tablet Take 150 mg by mouth as needed.        . sulfamethoxazole-trimethoprim (BACTRIM DS) 800-160 MG per tablet       . tamoxifen (NOLVADEX) 20 MG tablet Take 1 tablet (20 mg total) by mouth daily.  90 tablet  3  . vitamin E 400 UNIT capsule Take 400 Units by mouth daily.            Review of Systems Review of Systems  Constitutional: Negative for fever, appetite  change, fatigue and unexpected weight change.  Eyes: Negative for pain and visual disturbance.  Respiratory: Negative for cough and shortness of breath.   Cardiovascular: Negative for cp or palpitations    Gastrointestinal: Negative for nausea, diarrhea and constipation.  Genitourinary: pos  for urgency and frequency. neg for flank pain , see HPI Skin: Negative for pallor or rash   Neurological: Negative for weakness, light-headedness, numbness and headaches.  Hematological: Negative for adenopathy. Does does bleed slightly easily, no excessive bruising  Psychiatric/Behavioral: Negative for dysphoric mood. The patient is not nervous/anxious.         Objective:   Physical Exam  Constitutional: She appears well-developed and well-nourished. No distress.  HENT:  Head: Normocephalic and atraumatic.  Mouth/Throat: Oropharynx is clear and moist. No oropharyngeal exudate.  Eyes: Conjunctivae normal and EOM are normal. Pupils are equal, round, and reactive to light. Right eye exhibits no discharge. Left eye exhibits no discharge. No scleral icterus.  Neck: Normal range of motion. Neck supple. No thyromegaly present.  Cardiovascular: Normal rate and regular rhythm.   Pulmonary/Chest: Effort normal and breath sounds normal. No respiratory distress. She has no wheezes.  Abdominal: Soft. Bowel sounds are normal. She exhibits no distension and no mass. There is tenderness.       Very mild suprapubic tenderness without fullness  Musculoskeletal: She exhibits no edema and no tenderness.       No cva tenderness   Lymphadenopathy:    She has no cervical adenopathy.  Neurological: She is alert. She has normal reflexes. No cranial nerve deficit. She exhibits normal muscle tone. Coordination normal.  Skin: Skin is warm and dry. No rash noted. No erythema. No pallor.  Psychiatric: She has a normal mood and affect.          Assessment & Plan:

## 2012-09-12 NOTE — Telephone Encounter (Signed)
Notified pt Rx was called in (as prescribed), advise pt to drink a lot of water and call us if sxs worsen before appt at 4:15 today

## 2012-09-12 NOTE — Assessment & Plan Note (Signed)
With gross hematuria (that has slowed down since am)- voiding symptoms but no flank pain tx with cipro 1 week cx urine Enc fluids

## 2012-09-14 LAB — POCT UA - MICROSCOPIC ONLY

## 2012-09-15 LAB — URINE CULTURE: Colony Count: 70000

## 2012-09-18 ENCOUNTER — Other Ambulatory Visit (HOSPITAL_BASED_OUTPATIENT_CLINIC_OR_DEPARTMENT_OTHER): Payer: Medicare Other | Admitting: Lab

## 2012-09-18 ENCOUNTER — Telehealth: Payer: Self-pay | Admitting: *Deleted

## 2012-09-18 ENCOUNTER — Ambulatory Visit (HOSPITAL_BASED_OUTPATIENT_CLINIC_OR_DEPARTMENT_OTHER): Payer: Medicare Other | Admitting: Oncology

## 2012-09-18 VITALS — BP 93/59 | HR 69 | Temp 98.3°F | Resp 20 | Ht 65.0 in | Wt 143.8 lb

## 2012-09-18 DIAGNOSIS — C50919 Malignant neoplasm of unspecified site of unspecified female breast: Secondary | ICD-10-CM

## 2012-09-18 DIAGNOSIS — Z170 Estrogen receptor positive status [ER+]: Secondary | ICD-10-CM

## 2012-09-18 DIAGNOSIS — C50419 Malignant neoplasm of upper-outer quadrant of unspecified female breast: Secondary | ICD-10-CM

## 2012-09-18 DIAGNOSIS — N390 Urinary tract infection, site not specified: Secondary | ICD-10-CM

## 2012-09-18 DIAGNOSIS — M810 Age-related osteoporosis without current pathological fracture: Secondary | ICD-10-CM

## 2012-09-18 LAB — CBC WITH DIFFERENTIAL/PLATELET
Basophils Absolute: 0 10*3/uL (ref 0.0–0.1)
Eosinophils Absolute: 0.1 10*3/uL (ref 0.0–0.5)
LYMPH%: 31 % (ref 14.0–49.7)
MCV: 92.7 fL (ref 79.5–101.0)
MONO%: 8.8 % (ref 0.0–14.0)
NEUT#: 3.1 10*3/uL (ref 1.5–6.5)
NEUT%: 58.4 % (ref 38.4–76.8)
Platelets: 157 10*3/uL (ref 145–400)
RBC: 4.09 10*6/uL (ref 3.70–5.45)

## 2012-09-18 LAB — COMPREHENSIVE METABOLIC PANEL (CC13)
BUN: 16 mg/dL (ref 7.0–26.0)
CO2: 24 mEq/L (ref 22–29)
Creatinine: 0.8 mg/dL (ref 0.6–1.1)
Glucose: 79 mg/dl (ref 70–99)
Total Bilirubin: 0.5 mg/dL (ref 0.20–1.20)

## 2012-09-18 LAB — VITAMIN D 25 HYDROXY (VIT D DEFICIENCY, FRACTURES): Vit D, 25-Hydroxy: 33 ng/mL (ref 30–89)

## 2012-09-18 NOTE — Progress Notes (Signed)
Hematology and Oncology Follow Up Visit  Gabrielle Webb [id number] 08-14-1941 71 y.o. 09/18/2012 10:00 AM PCP Dr  Jamey Ripa Principle Diagnosis: T1CN) N- er/pr+ breast cancer s/p lumpectomy 05/16/11, s/p xrt completed 07/11/11, on tamoxifen Hx of osteoperosis on Boniva  Interim History:  There have been no intercurrent illness, hospitalizations or medication changes. She has a UTI on cipro. Recent mammogram caused a lot of pain. She is otherwise doing well,she also takes ASA , 81 mg x 2. Medications: I have reviewed the patient's current medications.  Allergies:  Allergies  Allergen Reactions  . Codeine Nausea And Vomiting  . Morphine Nausea And Vomiting    Past Medical History, Surgical history, Social history, and Family History were reviewed and updated.  Review of Systems: Constitutional:  Negative for fever, chills, night sweats, anorexia, weight loss, pain. Cardiovascular: no chest pain or dyspnea on exertion Respiratory: no cough, shortness of breath, or wheezing Neurological: negative Dermatological: negative ENT: negative Skin Gastrointestinal: negative Genito-Urinary: negative Hematological and Lymphatic: negative Breast: negative Musculoskeletal: negative Remaining ROS negative.  Physical Exam: Blood pressure 93/59, pulse 69, temperature 98.3 F (36.8 C), resp. rate 20, height 5\' 5"  (1.651 m), weight 143 lb 12.8 oz (65.227 kg), last menstrual period 12/03/1988. ECOG: 0 General appearance: alert, cooperative and appears stated age Head: Normocephalic, without obvious abnormality, atraumatic Neck: no adenopathy, no carotid bruit, no JVD, supple, symmetrical, trachea midline and thyroid not enlarged, symmetric, no tenderness/mass/nodules Lymph nodes: Cervical, supraclavicular, and axillary nodes normal. Cardiac : regular rate and rhythm, no murmurs or gallops Pulmonary:clear to auscultation bilaterally and normal percussion bilaterally Breasts: inspection negative,  no nipple discharge or bleeding, no masses or nodularity palpable, some excoriated areas on chest, unlikely to be shingles. Abdomen:soft, non-tender; bowel sounds normal; no masses,  no organomegaly Extremities negative Neuro: alert, oriented, normal speech, no focal findings or movement disorder noted  Lab Results: Lab Results  Component Value Date   WBC 5.3 09/18/2012   HGB 13.1 09/18/2012   HCT 37.9 09/18/2012   MCV 92.7 09/18/2012   PLT 157 09/18/2012     Chemistry      Component Value Date/Time   NA 140 09/18/2012 0849   NA 141 03/19/2012 1002   K 4.1 09/18/2012 0849   K 3.8 03/19/2012 1002   CL 109* 09/18/2012 0849   CL 107 03/19/2012 1002   CO2 24 09/18/2012 0849   CO2 26 03/19/2012 1002   BUN 16.0 09/18/2012 0849   BUN 14 03/19/2012 1002   CREATININE 0.8 09/18/2012 0849   CREATININE 0.75 03/19/2012 1002      Component Value Date/Time   CALCIUM 8.9 09/18/2012 0849   CALCIUM 8.7 03/19/2012 1002   ALKPHOS 44 09/18/2012 0849   ALKPHOS 35* 03/19/2012 1002   AST 20 09/18/2012 0849   AST 18 03/19/2012 1002   ALT 17 09/18/2012 0849   ALT 12 03/19/2012 1002   BILITOT 0.50 09/18/2012 0849   BILITOT 0.5 03/19/2012 1002      .pathology. Radiological Studies: chest X-ray n/a Mammogram F/u 8/13 Bone density Last checked .Marland Kitchen osteoperosis  Impression and Plan: 71 year-old woman with history of node-negative, ER/PR positive breast cancer on tamoxifen. She'll remain on tamoxifen until we recheck a bone density test in one year. I discussed the  need for 3-D mammography, as well as the possible contribution of tamoxifen on her current UTI. I will see her in 12  months time for followup. We will alternate visits with Dr. Jamey Ripa.  Gilford Raid.D.  FRCP C..  More than 50% of the visit was spent in patient-related counselling   Kinzie Wickes, MD 10/17/201310:00 AM

## 2012-09-18 NOTE — Telephone Encounter (Signed)
Mailed out calendar to inform the patient of the 2014 appointment

## 2012-09-24 NOTE — Progress Notes (Signed)
  Subjective:    Patient ID: LITTLE BASHORE, female    DOB: Feb 16, 1941, 71 y.o.   MRN: [id number]  HPI  appt opened in error   Review of Systems     Objective:   Physical Exam        Assessment & Plan:

## 2012-10-17 ENCOUNTER — Encounter (INDEPENDENT_AMBULATORY_CARE_PROVIDER_SITE_OTHER): Payer: Self-pay | Admitting: Surgery

## 2012-10-17 ENCOUNTER — Ambulatory Visit (INDEPENDENT_AMBULATORY_CARE_PROVIDER_SITE_OTHER): Payer: Medicare Other | Admitting: Surgery

## 2012-10-17 VITALS — BP 118/72 | HR 60 | Temp 97.4°F | Resp 16 | Ht 65.0 in | Wt 143.0 lb

## 2012-10-17 DIAGNOSIS — Z853 Personal history of malignant neoplasm of breast: Secondary | ICD-10-CM

## 2012-10-17 NOTE — Patient Instructions (Signed)
See me again in April, sooner if any problems. Be sure to continue annual mammograms

## 2012-10-17 NOTE — Progress Notes (Signed)
NAME: Gabrielle Webb       DOB: Feb 22, 1941           DATE: 10/17/2012       MRN: [id number]   Gabrielle Webb is a 70 y.o.Marland Kitchenfemale who presents for routine followup of her Stage I Right IDC, recptor + diagnosed in 2012 and treated with Lumpectomy with SLN, then radiation and now Tamoxifen. She has no problems or concerns on either side.she did show me a report from 24 where she had a right breast biopsy that showed some focal atypical ductal hyperplasia and she wondered whether that meant she had a mastectomy back then. We discussed that I told her that the current recommendation would not be for anything other than close followup  PFSH: She has had no significant changes since the last visit here.  ROS: There have been no significant changes since the last visit here  EXAM:  VS: BP 118/72  Pulse 60  Temp 97.4 F (36.3 C) (Temporal)  Resp 16  Ht 5\' 5"  (1.651 m)  Wt 143 lb (64.864 kg)  BMI 23.80 kg/m2  LMP 12/03/1988  General: The patient is alert, oriented, generally healthy appearing, NAD. Mood and affect are normal.  Breasts:  right breast has minimal changes from the lumpectomy with of loss of tissue. There is no mass. Left breast is normal.  Lymphatics: She has no axillary or supraclavicular adenopathy on either side.  Extremities: Full ROM of the surgical side with no lymphedema noted.  Data Reviewed: Mammograsm in June: IMPRESSION:  No evidence of malignancy. A follow up bilateral diagnostic  mammogram is recommended in one year.  BI-RADS CATEGORY 2: Benign finding(s).  Original Report Authenticated By: Darrol Angel, M.D.   Impression: Doing well, with no evidence of recurrent cancer or new cancer  Plan: Will continue to follow up on an annual basis here, next visit in April so we can alternate with Dr Donnie Coffin

## 2012-11-14 ENCOUNTER — Other Ambulatory Visit: Payer: Self-pay | Admitting: *Deleted

## 2012-11-14 MED ORDER — IBANDRONATE SODIUM 150 MG PO TABS: 150.0000 mg | ORAL_TABLET | ORAL | Status: DC

## 2012-11-14 MED ORDER — LEVOTHYROXINE SODIUM 125 MCG PO TABS: 125.0000 ug | ORAL_TABLET | Freq: Every day | ORAL | Status: DC

## 2012-11-18 ENCOUNTER — Other Ambulatory Visit: Payer: Self-pay | Admitting: *Deleted

## 2012-11-18 DIAGNOSIS — C50919 Malignant neoplasm of unspecified site of unspecified female breast: Secondary | ICD-10-CM

## 2012-11-18 MED ORDER — TAMOXIFEN CITRATE 20 MG PO TABS: 20.0000 mg | ORAL_TABLET | Freq: Every day | ORAL | Status: DC

## 2012-12-09 ENCOUNTER — Other Ambulatory Visit: Payer: Self-pay | Admitting: *Deleted

## 2012-12-09 MED ORDER — CHLORTHALIDONE 50 MG PO TABS: 50.0000 mg | ORAL_TABLET | Freq: Every day | ORAL | Status: DC | PRN

## 2012-12-09 MED ORDER — DICYCLOMINE HCL 10 MG PO CAPS: 10.0000 mg | ORAL_CAPSULE | Freq: Two times a day (BID) | ORAL | Status: DC | PRN

## 2012-12-09 MED ORDER — IBANDRONATE SODIUM 150 MG PO TABS: 150.0000 mg | ORAL_TABLET | ORAL | Status: DC

## 2012-12-10 ENCOUNTER — Telehealth: Payer: Self-pay | Admitting: *Deleted

## 2012-12-10 NOTE — Telephone Encounter (Signed)
Pt called because she received her letter.  Answered all of her questions at this time.  She is requesting to see Dr. Welton Flakes and I told her that we would put her on a list to call back and we would make sure we scheduled her w/ Dr. Welton Flakes.

## 2012-12-12 ENCOUNTER — Telehealth: Payer: Self-pay | Admitting: Oncology

## 2012-12-12 ENCOUNTER — Other Ambulatory Visit: Payer: Self-pay | Admitting: *Deleted

## 2012-12-12 MED ORDER — IBANDRONATE SODIUM 150 MG PO TABS: ORAL_TABLET | ORAL | Status: DC

## 2012-12-12 NOTE — Telephone Encounter (Signed)
Returned patients call.  Cancelled follow up.  She requested to see Dr. Welton Flakes.

## 2012-12-19 ENCOUNTER — Other Ambulatory Visit: Payer: Self-pay | Admitting: *Deleted

## 2012-12-19 MED ORDER — IBANDRONATE SODIUM 150 MG PO TABS: ORAL_TABLET | ORAL | Status: DC

## 2012-12-19 NOTE — Telephone Encounter (Signed)
Ok to refill? no recent follow up appt and no future appt has had a few acute visits

## 2012-12-19 NOTE — Telephone Encounter (Signed)
Please refil for 6 months, thanks 

## 2012-12-19 NOTE — Telephone Encounter (Signed)
done

## 2013-01-01 ENCOUNTER — Other Ambulatory Visit: Payer: Self-pay | Admitting: *Deleted

## 2013-01-01 ENCOUNTER — Encounter: Payer: Self-pay | Admitting: Oncology

## 2013-01-01 ENCOUNTER — Telehealth: Payer: Self-pay | Admitting: *Deleted

## 2013-01-01 MED ORDER — LEVOTHYROXINE SODIUM 125 MCG PO TABS: 125.0000 ug | ORAL_TABLET | Freq: Every day | ORAL | Status: DC

## 2013-01-01 NOTE — Telephone Encounter (Signed)
Pt scheduled CPE for June, med refilled until then

## 2013-01-01 NOTE — Telephone Encounter (Signed)
Pt called about her appt and I confirmed 09/14/13 appt w/ pt.

## 2013-02-18 ENCOUNTER — Encounter: Payer: Self-pay | Admitting: Family Medicine

## 2013-02-18 ENCOUNTER — Ambulatory Visit (INDEPENDENT_AMBULATORY_CARE_PROVIDER_SITE_OTHER): Payer: Medicare Other | Admitting: Family Medicine

## 2013-02-18 VITALS — BP 106/68 | HR 82 | Temp 98.6°F | Ht 65.0 in

## 2013-02-18 DIAGNOSIS — J209 Acute bronchitis, unspecified: Secondary | ICD-10-CM | POA: Insufficient documentation

## 2013-02-18 MED ORDER — ALBUTEROL SULFATE HFA 108 (90 BASE) MCG/ACT IN AERS: 2.0000 | INHALATION_SPRAY | RESPIRATORY_TRACT | Status: DC | PRN

## 2013-02-18 NOTE — Patient Instructions (Addendum)
I think your sinus infection is improving  You do have some bronchitis - and the viral upper respiratory infection is leading to this  Try the albuterol inhaler for wheezing  If wheezing worsens we will need to consider prednisone - so let me know  Update if not starting to improve in a week or if worsening

## 2013-02-18 NOTE — Progress Notes (Signed)
Subjective:    Patient ID: Gabrielle Webb, female    DOB: [birth year], 72 y.o.   MRN: [id number]  HPI Here for ? Bronchitis with uri Symptoms started on 3/5 - with chills and malaise  [facility]ent to [facility] - for uri viral -- and given zpak to fill later if she was not feeling better  5 days later - no better - filled the antibiotic ( headache and ear ache and bad cong and cough)  Took the whole 5 days  Then went back to [facility] the following sat - and they put her on augmentin for bacterial sinusitis  Chest xray was negative -no pneumonia   Some improvement (can finally sleep in bed) - and sinus symptoms are improving  Having some rattling in chest - esp with deep breaths  Cough is about the same  Prod- is clearer than it was  Is done with fever  Only wheezes with deep breaths  On cough med - bromoheniramine pseudoephedrine - is helping some   Patient Active Problem List  Diagnosis  . HYPOTHYROIDISM  . Pure Hypercholesterolemia  . ANXIETY  . ALLERGIC RHINITIS  . GERD  . DIVERTICULOSIS, COLON  . IBS  . FIBROCYSTIC BREAST DISEASE  . OSTEOPOROSIS  . Other screening mammogram  . Post-menopausal  . Hx of Breast cancer, stage 1, Right, UOQ, Receptor+,Her2-  . Gynecological examination  . Breast cancer  . Hematuria  . UTI (lower urinary tract infection)   Past Medical History  Diagnosis Date  . Allergy   . Anxiety   . GERD (gastroesophageal reflux disease)   . Osteoporosis   . Diverticulosis of colon   . Vertigo   . Hypothyroid   . Breast cancer 7/12    Right  . Skin cancer     basal and squamous cell  . Cellulitis    Past Surgical History  Procedure Laterality Date  . Breast biopsy    . Knee surgery  1962  . Appendectomy  07/2007  . Breast lumpectomy     History  Substance Use Topics  . Smoking status: Never Smoker   . Smokeless tobacco: Never Used  . Alcohol Use: No   Family History  Problem Relation Age of Onset  . Heart attack Father   . Hypertension  Father   . Atrial fibrillation Mother   . Coronary artery disease Mother   . Diabetes      Grandmother  . Coronary artery disease      Grandmother  . Uterine cancer      Grandmother  . Leukemia      Aunt  . Cancer Maternal Aunt     leukemia  . Cancer Maternal Uncle     colon   Allergies  Allergen Reactions  . Codeine Nausea And Vomiting  . Morphine Nausea And Vomiting   Current Outpatient Prescriptions on File Prior to Visit  Medication Sig Dispense Refill  . ALPRAZolam (XANAX) 0.25 MG tablet Take 1 tablet (0.25 mg total) by mouth daily as needed.  30 tablet  3  . aspirin 81 MG tablet Take 81 mg by mouth 2 (two) times daily.       . Calcium-Vitamin D-Vitamin K (VIACTIV FLAVOR GLIDES) 500-200-40 MG-UNT-MCG TABS Take 1 each by mouth 3 (three) times daily.        . chlorthalidone (HYGROTON) 50 MG tablet Take 1 tablet (50 mg total) by mouth daily as needed. For swelling.  90 tablet  0  . Cholecalciferol (VITAMIN D) 2000  UNITS CAPS Take 1 capsule by mouth daily.      Marland Kitchen dicyclomine (BENTYL) 10 MG capsule Take 1 capsule (10 mg total) by mouth 2 (two) times daily as needed.  180 capsule  0  . Flaxseed, Linseed, (FLAX SEED OIL PO) Take 1 capsule by mouth daily.        Marland Kitchen gentamicin ointment (GARAMYCIN) 0.1 %       . ibandronate (BONIVA) 150 MG tablet Take one tablet by mouth every 30 days in the morning with a full glass of water, on an empty stomach, and do not take anything else by mouth or lie down for the next 30 min.  3 tablet  1  . levothyroxine (SYNTHROID) 125 MCG tablet Take 1 tablet (125 mcg total) by mouth daily.  90 tablet  1  . Multiple Vitamin (MULTIVITAMIN) capsule Take 1 capsule by mouth daily.        . niacin 500 MG tablet Take 500 mg by mouth daily.        . Omega-3 Fatty Acids (FISH OIL PO) Take 1 capsule by mouth daily.        . ranitidine (ZANTAC) 150 MG tablet Take 150 mg by mouth as needed.        . tamoxifen (NOLVADEX) 20 MG tablet Take 1 tablet (20 mg total) by  mouth daily.  90 tablet  3  . vitamin E 400 UNIT capsule Take 400 Units by mouth daily.         No current facility-administered medications on file prior to visit.        Review of Systems Review of Systems  Constitutional: Negative for fever, appetite change, fatigue and unexpected weight change.  Eyes: Negative for pain and visual disturbance.  ENT neg for sinus pain or ear pain or ear drainage  Respiratory: Negative for  shortness of breath.   Cardiovascular: Negative for cp or palpitations    Gastrointestinal: Negative for nausea, diarrhea and constipation.  Genitourinary: Negative for urgency and frequency.  Skin: Negative for pallor or rash   Neurological: Negative for weakness, light-headedness, numbness and headaches.  Hematological: Negative for adenopathy. Does not bruise/bleed easily.  Psychiatric/Behavioral: Negative for dysphoric mood. The patient is not nervous/anxious.         Objective:   Physical Exam  Constitutional: She appears well-developed and well-nourished. No distress.  HENT:  Head: Normocephalic and atraumatic.  Right Ear: External ear normal.  Left Ear: External ear normal.  Mouth/Throat: Oropharynx is clear and moist. No oropharyngeal exudate.  Boggy nares No sinus tenderness  Eyes: Conjunctivae and EOM are normal. Pupils are equal, round, and reactive to light. Right eye exhibits no discharge. Left eye exhibits no discharge.  Neck: Normal range of motion. Neck supple.  Cardiovascular: Normal rate and regular rhythm.   Pulmonary/Chest: Effort normal. No respiratory distress. She has wheezes. She has no rales.  Few rhonchi Scant wheeze on forced exp  Lymphadenopathy:    She has no cervical adenopathy.  Neurological: She is alert.  Skin: Skin is warm and dry. No rash noted.  Psychiatric: She has a normal mood and affect.          Assessment & Plan:

## 2013-02-18 NOTE — Assessment & Plan Note (Signed)
S/p uri and being treated for sinusitis (that is imp with augmentin) Also s/p zpak On exam- some rhonchi and a few wheeze Disc symptomatic care - see instructions on AVS  Will try albuterol mdi prn wheeze/ tightness and if worse consider prednisone  Update if not starting to improve in a week or if worsening

## 2013-02-25 ENCOUNTER — Encounter: Payer: Self-pay | Admitting: Radiation Oncology

## 2013-02-25 DIAGNOSIS — Z923 Personal history of irradiation: Secondary | ICD-10-CM | POA: Insufficient documentation

## 2013-02-27 ENCOUNTER — Ambulatory Visit
Admission: RE | Admit: 2013-02-27 | Discharge: 2013-02-27 | Disposition: A | Discharge status: Home / Self Care | Payer: Medicare Other | Source: Ambulatory Visit | Attending: Radiation Oncology | Admitting: Radiation Oncology

## 2013-02-27 ENCOUNTER — Encounter: Payer: Self-pay | Admitting: Radiation Oncology

## 2013-02-27 VITALS — BP 108/63 | HR 79 | Temp 97.4°F | Resp 20 | Wt 142.5 lb

## 2013-02-27 DIAGNOSIS — C50911 Malignant neoplasm of unspecified site of right female breast: Secondary | ICD-10-CM

## 2013-02-27 NOTE — Progress Notes (Signed)
Department of Radiation Oncology  Phone:  [phone] Fax:        [phone]   Name: Gabrielle Webb MRN: [id number]  DOB: 21-Sep-1941  Date: 02/27/2013  Follow Up Visit Note  Diagnosis: T1 N0 right breast cancer status post breast conservation (finished RT 05/2011) now on tamoxifen.  Interval History: Gabrielle Webb presents today for routine followup.  He is 2 years out from treatment. She is doing well. She is still taking tamoxifen and tolerating that well. She has occasional pains in her breast. She is being treated for bronchitis. She has appointment Dr. Jamey Ripa in April and mammogram scheduled in June. Her last mammograms were last year and they were fine. She has no breast related complaints today.  Allergies:  Allergies  Allergen Reactions  . Codeine Nausea And Vomiting  . Morphine Nausea And Vomiting    Medications:  Current Outpatient Prescriptions  Medication Sig Dispense Refill  . albuterol (PROVENTIL HFA;VENTOLIN HFA) 108 (90 BASE) MCG/ACT inhaler Inhale 2 puffs into the lungs every 4 (four) hours as needed for wheezing.  1 Inhaler  1  . ALPRAZolam (XANAX) 0.25 MG tablet Take 1 tablet (0.25 mg total) by mouth daily as needed.  30 tablet  3  . amoxicillin-clavulanate (AUGMENTIN) 875-125 MG per tablet Take 1 tablet by mouth 2 (two) times daily.      Marland Kitchen aspirin 81 MG tablet Take 81 mg by mouth 2 (two) times daily.       . benzonatate (TESSALON) 100 MG capsule Take 100 mg by mouth every 8 (eight) hours as needed for cough.      . brompheniramine-pseudoephedrine-DM 30-2-10 MG/5ML syrup Take 5 mLs by mouth every 6 (six) hours as needed.      . Calcium-Vitamin D-Vitamin K (VIACTIV FLAVOR GLIDES) 500-200-40 MG-UNT-MCG TABS Take 1 each by mouth 3 (three) times daily.        . chlorthalidone (HYGROTON) 50 MG tablet Take 1 tablet (50 mg total) by mouth daily as needed. For swelling.  90 tablet  0  . Cholecalciferol (VITAMIN D) 2000 UNITS CAPS Take 1 capsule by mouth daily.      Marland Kitchen  dicyclomine (BENTYL) 10 MG capsule Take 1 capsule (10 mg total) by mouth 2 (two) times daily as needed.  180 capsule  0  . Flaxseed, Linseed, (FLAX SEED OIL PO) Take 1 capsule by mouth daily.        Marland Kitchen gentamicin ointment (GARAMYCIN) 0.1 %       . ibandronate (BONIVA) 150 MG tablet Take one tablet by mouth every 30 days in the morning with a full glass of water, on an empty stomach, and do not take anything else by mouth or lie down for the next 30 min.  3 tablet  1  . levothyroxine (SYNTHROID) 125 MCG tablet Take 1 tablet (125 mcg total) by mouth daily.  90 tablet  1  . Multiple Vitamin (MULTIVITAMIN) capsule Take 1 capsule by mouth daily.        . niacin 500 MG tablet Take 500 mg by mouth daily.        . Omega-3 Fatty Acids (FISH OIL PO) Take 1 capsule by mouth daily.        . ranitidine (ZANTAC) 150 MG tablet Take 150 mg by mouth as needed.        . tamoxifen (NOLVADEX) 20 MG tablet Take 1 tablet (20 mg total) by mouth daily.  90 tablet  3  . vitamin E 400 UNIT capsule Take  400 Units by mouth daily.         No current facility-administered medications for this encounter.    Physical Exam:  Filed Vitals:   02/27/13 1250  BP: 108/63  Pulse: 79  Temp: 97.4 F (36.3 C)  Resp: 20   she is a pleasant female in no distress sitting comfortably in examining room table. She has no palpable abnormalities of the left breast. She has some scar tissue in the upper outer quadrant. No palpable axillary supraclavicular or cervical adenopathy.  IMPRESSION: Gabrielle Webb is a 72 y.o. female status post breast conservation with no evidence of disease currently on tamoxifen  PLAN:  Patient is doing well. I released her from followup with me. I be happy to see her back on an as-needed basis. She has mammograms and followup scheduled with surgery and medical oncology.    Lurline Hare, MD

## 2013-02-27 NOTE — Progress Notes (Signed)
Pt denies pain but states she still has soreness w/pressure in her right axilla at scar area. She is currently under tx for bronchitis, will complete antibiotic in 2 days. Mammogram 05/19/13. Taking Tamoxifen daily.

## 2013-03-02 ENCOUNTER — Telehealth: Payer: Self-pay | Admitting: *Deleted

## 2013-03-02 NOTE — Telephone Encounter (Signed)
CALLED  PATIENT TO INFORM OF BONE DENSITY ON 11-12-13- ARRIVAL TIME - 10:15 AM AT THE [facility], SPOKE WITH PATIENT AND SHE IS AWARE OF THIS TEST.

## 2013-03-17 ENCOUNTER — Ambulatory Visit (INDEPENDENT_AMBULATORY_CARE_PROVIDER_SITE_OTHER): Payer: Medicare Other | Admitting: Surgery

## 2013-03-17 ENCOUNTER — Encounter (INDEPENDENT_AMBULATORY_CARE_PROVIDER_SITE_OTHER): Payer: Self-pay | Admitting: Surgery

## 2013-03-17 VITALS — BP 120/70 | HR 96 | Temp 98.6°F | Resp 18 | Ht 65.5 in | Wt 144.0 lb

## 2013-03-17 DIAGNOSIS — Z853 Personal history of malignant neoplasm of breast: Secondary | ICD-10-CM

## 2013-03-17 NOTE — Patient Instructions (Addendum)
Continue annual follow ups and mammograms (that is due in June)

## 2013-03-17 NOTE — Progress Notes (Signed)
NAME: [name]       DOB: October 20, 1941           DATE: 03/17/2013       MRN: [id number]   Gabrielle Webb is a 72 y.o.Marland Kitchenfemale who presents for routine followup of her Stage I Right IDC, recptor + diagnosed in 2012 and treated with Lumpectomy with SLN, then radiation and now Tamoxifen. She has no problems or concerns on either side.she did show me a report from 13 where she had a right breast biopsy that showed some focal atypical ductal hyperplasia and she wondered whether that meant she had a mastectomy back then. We discussed that I told her that the current recommendation would not be for anything other than close followup  PFSH: She has had no significant changes since the last visit here, except excision of some skin cancers  ROS: There have been no significant changes since the last visit here  EXAM:  VS: BP 120/70  Pulse 96  Temp(Src) 98.6 F (37 C) (Temporal)  Resp 18  Ht 5' 5.5" (1.664 m)  Wt 144 lb (65.318 kg)  BMI 23.59 kg/m2  LMP 12/03/1988  General: The patient is alert, oriented, generally healthy appearing, NAD. Mood and affect are normal.  Breasts:  right breast has minimal changes from the lumpectomy with of loss of tissue. There is no mass. Left breast is normal.  Lymphatics: She has no axillary or supraclavicular adenopathy on either side.  Extremities: Full ROM of the surgical side with no lymphedema noted.  Data Reviewed: Mammograsm in June: IMPRESSION:  No evidence of malignancy. A follow up bilateral diagnostic  mammogram is recommended in one year.  BI-RADS CATEGORY 2: Benign finding(s).  Original Report Authenticated By: Darrol Angel, M.D.   Impression: Doing well, with no evidence of recurrent cancer or new cancer  Plan: Will continue to follow up on an annual basis here, next visit in April so we can alternate with Dr Welton Flakes

## 2013-04-03 ENCOUNTER — Telehealth: Payer: Self-pay | Admitting: Family Medicine

## 2013-04-03 MED ORDER — ALPRAZOLAM 0.25 MG PO TABS: 0.2500 mg | ORAL_TABLET | Freq: Every day | ORAL | Status: DC | PRN

## 2013-04-03 NOTE — Telephone Encounter (Signed)
Spoke to patient by phone - she is going through a lot with husband's new cancer dx (he is terminal) and needs refil of xanax  Px written for call in

## 2013-04-06 NOTE — Telephone Encounter (Signed)
Rx called in as prescribed 

## 2013-04-24 ENCOUNTER — Encounter: Payer: Self-pay | Admitting: Radiology

## 2013-04-28 ENCOUNTER — Telehealth: Payer: Self-pay | Admitting: Family Medicine

## 2013-04-28 ENCOUNTER — Other Ambulatory Visit (INDEPENDENT_AMBULATORY_CARE_PROVIDER_SITE_OTHER): Payer: Medicare Other

## 2013-04-28 ENCOUNTER — Other Ambulatory Visit: Payer: Medicare Other

## 2013-04-28 DIAGNOSIS — M810 Age-related osteoporosis without current pathological fracture: Secondary | ICD-10-CM

## 2013-04-28 DIAGNOSIS — Z0000 Encounter for general adult medical examination without abnormal findings: Secondary | ICD-10-CM

## 2013-04-28 DIAGNOSIS — E039 Hypothyroidism, unspecified: Secondary | ICD-10-CM

## 2013-04-28 DIAGNOSIS — E7800 Pure hypercholesterolemia, unspecified: Secondary | ICD-10-CM

## 2013-04-28 LAB — COMPREHENSIVE METABOLIC PANEL
Albumin: 3.9 g/dL (ref 3.5–5.2)
Alkaline Phosphatase: 27 U/L — ABNORMAL LOW (ref 39–117)
BUN: 11 mg/dL (ref 6–23)
CO2: 26 mEq/L (ref 19–32)
GFR: 89.41 mL/min (ref >60.00)
Glucose, Bld: 83 mg/dL (ref 70–99)
Total Bilirubin: 0.6 mg/dL (ref 0.3–1.2)

## 2013-04-28 LAB — CBC WITH DIFFERENTIAL/PLATELET
Basophils Relative: 0.2 % (ref 0.0–3.0)
Eosinophils Relative: 0.7 % (ref 0.0–5.0)
HCT: 37.8 % (ref 36.0–46.0)
Hemoglobin: 12.7 g/dL (ref 12.0–15.0)
Lymphs Abs: 2 10*3/uL (ref 0.7–4.0)
MCV: 90.3 fl (ref 78.0–100.0)
Monocytes Absolute: 0.5 10*3/uL (ref 0.1–1.0)
Monocytes Relative: 8.7 % (ref 3.0–12.0)
RBC: 4.19 Mil/uL (ref 3.87–5.11)
WBC: 6.1 10*3/uL (ref 4.5–10.5)

## 2013-04-28 LAB — LIPID PANEL
Cholesterol: 163 mg/dL (ref 0–200)
LDL Cholesterol: 89 mg/dL (ref 0–99)
Triglycerides: 76 mg/dL (ref 0.0–149.0)
VLDL: 15.2 mg/dL (ref 0.0–40.0)

## 2013-04-28 LAB — TSH: TSH: 0.29 u[IU]/mL — ABNORMAL LOW (ref 0.35–5.50)

## 2013-04-28 NOTE — Telephone Encounter (Signed)
Message copied by Judy Pimple on Tue Apr 28, 2013 10:44 AM ------      Message from: Alvina Chou      Created: Fri Apr 10, 2013 11:26 AM      Regarding: lab orders for Tuesday, 5.27.14       Patient is scheduled for CPX labs, please order future labs, Thanks , Terri       ------

## 2013-05-05 ENCOUNTER — Encounter: Payer: Self-pay | Admitting: Radiology

## 2013-05-06 ENCOUNTER — Other Ambulatory Visit (HOSPITAL_COMMUNITY)
Admission: RE | Admit: 2013-05-06 | Discharge: 2013-05-06 | Disposition: A | Discharge status: Home / Self Care | Payer: Medicare Other | Source: Ambulatory Visit | Attending: Family Medicine | Admitting: Family Medicine

## 2013-05-06 ENCOUNTER — Encounter: Payer: Self-pay | Admitting: Family Medicine

## 2013-05-06 ENCOUNTER — Ambulatory Visit (INDEPENDENT_AMBULATORY_CARE_PROVIDER_SITE_OTHER): Payer: Medicare Other | Admitting: Family Medicine

## 2013-05-06 VITALS — BP 126/70 | HR 76 | Temp 98.8°F | Ht 63.5 in | Wt 136.0 lb

## 2013-05-06 DIAGNOSIS — Z1211 Encounter for screening for malignant neoplasm of colon: Secondary | ICD-10-CM

## 2013-05-06 DIAGNOSIS — Z124 Encounter for screening for malignant neoplasm of cervix: Secondary | ICD-10-CM | POA: Insufficient documentation

## 2013-05-06 DIAGNOSIS — E039 Hypothyroidism, unspecified: Secondary | ICD-10-CM

## 2013-05-06 DIAGNOSIS — Z0000 Encounter for general adult medical examination without abnormal findings: Secondary | ICD-10-CM

## 2013-05-06 DIAGNOSIS — C50919 Malignant neoplasm of unspecified site of unspecified female breast: Secondary | ICD-10-CM

## 2013-05-06 DIAGNOSIS — M810 Age-related osteoporosis without current pathological fracture: Secondary | ICD-10-CM

## 2013-05-06 DIAGNOSIS — Z01419 Encounter for gynecological examination (general) (routine) without abnormal findings: Secondary | ICD-10-CM | POA: Insufficient documentation

## 2013-05-06 DIAGNOSIS — E7800 Pure hypercholesterolemia, unspecified: Secondary | ICD-10-CM

## 2013-05-06 DIAGNOSIS — C50911 Malignant neoplasm of unspecified site of right female breast: Secondary | ICD-10-CM

## 2013-05-06 MED ORDER — LEVOTHYROXINE SODIUM 112 MCG PO TABS: 112.0000 ug | ORAL_TABLET | Freq: Every day | ORAL | Status: DC

## 2013-05-06 NOTE — Patient Instructions (Addendum)
We will do referral for colonoscopy at check out  We need to change your thyroid dose  Decrease thyroid dose to 112 mcg daily Schedule non fasting labs for 6 weeks

## 2013-05-06 NOTE — Progress Notes (Signed)
Subjective:    Patient ID: Gabrielle Webb, female    DOB: [birth year], 72 y.o.   MRN: [id number]  HPI I have personally reviewed the Medicare Annual Wellness questionnaire and have noted 1. The patient's medical and social history 2. Their use of alcohol, tobacco or illicit drugs 3. Their current medications and supplements 4. The patient's functional ability including ADL's, fall risks, home safety risks and hearing or visual             impairment. 5. Diet and physical activities 6. Evidence for depression or mood disorders  Lost husband last week  Hospice for cancer - and he had a peaceful passing  Doing ok emotionally -- and has good support from faith and also family and friends   The patients weight, height, BMI have been recorded in the chart and visual acuity is per eye clinic.  I have made referrals, counseling and provided education to the patient based review of the above and I have provided the pt with a written personalized care plan for preventive services.  See scanned forms.  Routine anticipatory guidance given to patient.  See health maintenance. Flu shot 10/12 Shingles 12/08 vaccine  PNA 1/08 Tetanus 1/07 Colon 4/04 - due for that for screening (Dr Markham Jordan) Breast cancer screening 6/59mammogram - has that set up 6/17 and then sees Dr Park Breed in October (did see Dr Donnie Coffin)  Due to hx of breast cancer- pap needs to be done   Advance directive -she has that set up - with POA Cognitive function addressed- see scanned forms- and if abnormal then additional documentation follows.  No concerns   PMH and SH reviewed  Meds, vitals, and allergies reviewed.   ROS: See HPI.  Otherwise negative.    Falls-none  Fractures-none   OP dexa was 12/12  D level 33- does take her vit D and boniva   Mood- in acute grief right now but overall doing ok with good support and declines the need for counseling   tsh low Lab Results  Component Value Date   TSH 0.29* 04/28/2013   no symptoms but looks like we need to dec dose   Hyperlipidemia Lab Results  Component Value Date   CHOL 163 04/28/2013   HDL 58.50 04/28/2013   LDLCALC 89 04/28/2013   LDLDIRECT 142.0 03/19/2011   TRIG 76.0 04/28/2013   CHOLHDL 3 04/28/2013    Patient Active Problem List   Diagnosis Date Noted  . Encounter for routine gynecological examination 05/06/2013  . Colon cancer screening 05/06/2013  . Encounter for Medicare annual wellness exam 04/28/2013  . Hx of radiation therapy   . Acute bronchitis 02/18/2013  . Hematuria 09/12/2012  . Breast cancer   . Gynecological examination 12/25/2011  . Hx of Breast cancer, stage 1, Right, UOQ, Receptor+,Her2- 05/29/2011    Class: Stage 1  . Other screening mammogram 03/21/2011  . Post-menopausal 03/21/2011  . HYPOTHYROIDISM 01/23/2008  . Pure Hypercholesterolemia 01/23/2008  . ANXIETY 01/23/2008  . ALLERGIC RHINITIS 01/23/2008  . GERD 01/23/2008  . DIVERTICULOSIS, COLON 01/23/2008  . IBS 01/23/2008  . FIBROCYSTIC BREAST DISEASE 01/23/2008  . OSTEOPOROSIS 10/17/2007   Past Medical History  Diagnosis Date  . Allergy   . Anxiety   . GERD (gastroesophageal reflux disease)   . Osteoporosis   . Diverticulosis of colon   . Vertigo   . Hypothyroid   . Breast cancer 7/12    Right  . Skin cancer     basal and squamous  cell  . Cellulitis   . Hx of radiation therapy 06/20/11 - 07/11/11    right breast   Past Surgical History  Procedure Laterality Date  . Breast biopsy    . Knee surgery  1962  . Appendectomy  07/2007  . Breast lumpectomy Right 05/16/2011   History  Substance Use Topics  . Smoking status: Never Smoker   . Smokeless tobacco: Never Used  . Alcohol Use: No   Family History  Problem Relation Age of Onset  . Heart attack Father   . Hypertension Father   . Atrial fibrillation Mother   . Coronary artery disease Mother   . Diabetes      Grandmother  . Coronary artery disease      Grandmother  . Uterine cancer       Grandmother  . Leukemia      Aunt  . Cancer Maternal Aunt     leukemia  . Cancer Maternal Uncle     colon   Allergies  Allergen Reactions  . Codeine Nausea And Vomiting  . Morphine Nausea And Vomiting   Current Outpatient Prescriptions on File Prior to Visit  Medication Sig Dispense Refill  . ALPRAZolam (XANAX) 0.25 MG tablet Take 1 tablet (0.25 mg total) by mouth daily as needed for sleep or anxiety.  30 tablet  3  . aspirin 81 MG tablet Take 81 mg by mouth 2 (two) times daily.       . Calcium-Vitamin D-Vitamin K (VIACTIV FLAVOR GLIDES) 500-200-40 MG-UNT-MCG TABS Take 1 each by mouth 3 (three) times daily.        . chlorthalidone (HYGROTON) 50 MG tablet Take 1 tablet (50 mg total) by mouth daily as needed. For swelling.  90 tablet  0  . Cholecalciferol (VITAMIN D) 2000 UNITS CAPS Take 1 capsule by mouth daily.      Marland Kitchen dicyclomine (BENTYL) 10 MG capsule Take 1 capsule (10 mg total) by mouth 2 (two) times daily as needed.  180 capsule  0  . Flaxseed, Linseed, (FLAX SEED OIL PO) Take 1 capsule by mouth daily.        Marland Kitchen ibandronate (BONIVA) 150 MG tablet Take one tablet by mouth every 30 days in the morning with a full glass of water, on an empty stomach, and do not take anything else by mouth or lie down for the next 30 min.  3 tablet  1  . Multiple Vitamin (MULTIVITAMIN) capsule Take 1 capsule by mouth daily.        . niacin 500 MG tablet Take 500 mg by mouth daily.        . Omega-3 Fatty Acids (FISH OIL PO) Take 1 capsule by mouth daily.        . ranitidine (ZANTAC) 150 MG tablet Take 150 mg by mouth as needed.        . tamoxifen (NOLVADEX) 20 MG tablet Take 1 tablet (20 mg total) by mouth daily.  90 tablet  3  . vitamin E 400 UNIT capsule Take 400 Units by mouth daily.         No current facility-administered medications on file prior to visit.    Review of Systems Review of Systems  Constitutional: Negative for fever, appetite change, fatigue and unexpected weight change.  Eyes:  Negative for pain and visual disturbance.  Respiratory: Negative for cough and shortness of breath.   Cardiovascular: Negative for cp or palpitations    Gastrointestinal: Negative for nausea, diarrhea and constipation.  Genitourinary:  Negative for urgency and frequency.  Skin: Negative for pallor or rash   Neurological: Negative for weakness, light-headedness, numbness and headaches.  Hematological: Negative for adenopathy. Does not bruise/bleed easily.  Psychiatric/Behavioral: Negative for dysphoric mood. The patient is not nervous/anxious. Pos for grief/ sadness after just loosing her husband        Objective:   Physical Exam  Constitutional: She appears well-developed and well-nourished. No distress.  HENT:  Head: Normocephalic and atraumatic.  Right Ear: External ear normal.  Left Ear: External ear normal.  Nose: Nose normal.  Mouth/Throat: Oropharynx is clear and moist.  Eyes: Conjunctivae and EOM are normal. Pupils are equal, round, and reactive to light. Right eye exhibits no discharge. Left eye exhibits no discharge. No scleral icterus.  Neck: Normal range of motion. Neck supple. No JVD present. Carotid bruit is not present. No thyromegaly present.  Cardiovascular: Normal rate, regular rhythm, normal heart sounds and intact distal pulses.  Exam reveals no gallop.   Pulmonary/Chest: Effort normal and breath sounds normal. No respiratory distress. She has no wheezes. She exhibits no tenderness.  Abdominal: Soft. Bowel sounds are normal. She exhibits no distension, no abdominal bruit and no mass. There is no tenderness.  Genitourinary: Vagina normal and uterus normal. No vaginal discharge found.  o External genitalia nl appearing o Urethral meatus nl appearing  o Urethra  Nl appearing/ nt and normally mobile o Bladder nt and nl by palpation o Vagina nl mucosa and no bleeding or d/c o Cervix  Nl appearing o Uterus  No tenderness or enl o Adnexa/parametria nl by palpation  o  Anus and perineum nl appearing with good tone  Breast exam: No mass, nodules, thickening, tenderness, bulging, retraction, inflamation, nipple discharge or skin changes noted.  No axillary or clavicular LA.  Chaperoned exam.  (right breast surgical changes noted)   Musculoskeletal: Normal range of motion. She exhibits no edema and no tenderness.  Lymphadenopathy:    She has no cervical adenopathy.  Neurological: She is alert. She has normal reflexes. No cranial nerve deficit. She exhibits normal muscle tone. Coordination normal.  Skin: Skin is warm and dry. No rash noted. No erythema. No pallor.  Psychiatric: She has a normal mood and affect. Her mood appears not anxious. Her affect is not blunt and not labile. She does not exhibit a depressed mood.          Assessment & Plan:

## 2013-05-07 NOTE — Assessment & Plan Note (Signed)
Disc goals for lipids and reasons to control them Rev labs with pt Rev low sat fat diet in detail   

## 2013-05-07 NOTE — Assessment & Plan Note (Signed)
Low tsh Dose decreased to 112 mcg Lab in 6 wk Clinically stable

## 2013-05-07 NOTE — Assessment & Plan Note (Signed)
utd mammograms Exam today Continues f/u with Dr Park Breed Pap/ pelvic done today

## 2013-05-07 NOTE — Assessment & Plan Note (Signed)
Ref for f/u colonoscopy for screening

## 2013-05-07 NOTE — Assessment & Plan Note (Signed)
Per pt - oncologist wants her to continue pap smears in light of prev breast cancer Nl exam Pap sent No c/o

## 2013-05-07 NOTE — Assessment & Plan Note (Signed)
Reviewed health habits including diet and exercise and skin cancer prevention Also reviewed health mt list, fam hx and immunizations  See HPI Labs reviewed  

## 2013-05-07 NOTE — Assessment & Plan Note (Signed)
Tolerating boniva Rev ca and D and exercise No fx

## 2013-05-08 ENCOUNTER — Encounter: Payer: Self-pay | Admitting: *Deleted

## 2013-05-19 ENCOUNTER — Ambulatory Visit
Admission: RE | Admit: 2013-05-19 | Discharge: 2013-05-19 | Disposition: A | Discharge status: Home / Self Care | Payer: Medicare Other | Source: Ambulatory Visit | Attending: Oncology | Admitting: Oncology

## 2013-05-19 ENCOUNTER — Other Ambulatory Visit: Payer: Self-pay | Admitting: Oncology

## 2013-05-19 ENCOUNTER — Encounter: Payer: Self-pay | Admitting: Family Medicine

## 2013-05-19 DIAGNOSIS — C50919 Malignant neoplasm of unspecified site of unspecified female breast: Secondary | ICD-10-CM

## 2013-06-07 ENCOUNTER — Telehealth: Payer: Self-pay | Admitting: Family Medicine

## 2013-06-07 DIAGNOSIS — E039 Hypothyroidism, unspecified: Secondary | ICD-10-CM

## 2013-06-07 NOTE — Telephone Encounter (Signed)
Message copied by Judy Pimple on Sun Jun 07, 2013  8:42 PM ------      Message from: Alvina Chou      Created: Tue Jun 02, 2013  2:19 PM      Regarding: Lab orders for Monday, 7.7.14       Labs, no f/u appt ------

## 2013-06-08 ENCOUNTER — Other Ambulatory Visit (INDEPENDENT_AMBULATORY_CARE_PROVIDER_SITE_OTHER): Payer: Medicare Other

## 2013-06-08 DIAGNOSIS — E039 Hypothyroidism, unspecified: Secondary | ICD-10-CM

## 2013-06-08 LAB — TSH: TSH: 0.66 u[IU]/mL (ref 0.35–5.50)

## 2013-06-09 ENCOUNTER — Encounter: Payer: Self-pay | Admitting: *Deleted

## 2013-06-09 ENCOUNTER — Other Ambulatory Visit: Payer: Medicare Other

## 2013-06-23 ENCOUNTER — Other Ambulatory Visit: Payer: Self-pay | Admitting: *Deleted

## 2013-06-23 MED ORDER — IBANDRONATE SODIUM 150 MG PO TABS: ORAL_TABLET | ORAL | Status: DC

## 2013-07-13 ENCOUNTER — Ambulatory Visit: Payer: Self-pay | Admitting: Unknown Physician Specialty

## 2013-07-27 ENCOUNTER — Encounter: Payer: Self-pay | Admitting: Family Medicine

## 2013-08-28 ENCOUNTER — Other Ambulatory Visit: Payer: Self-pay | Admitting: Family Medicine

## 2013-08-28 MED ORDER — LEVOTHYROXINE SODIUM 112 MCG PO TABS: 112.0000 ug | ORAL_TABLET | Freq: Every day | ORAL | Status: DC

## 2013-09-14 ENCOUNTER — Other Ambulatory Visit: Payer: Self-pay | Admitting: Oncology

## 2013-09-14 ENCOUNTER — Telehealth: Payer: Self-pay | Admitting: Oncology

## 2013-09-14 ENCOUNTER — Ambulatory Visit (HOSPITAL_BASED_OUTPATIENT_CLINIC_OR_DEPARTMENT_OTHER): Payer: Medicare Other | Admitting: Adult Health

## 2013-09-14 ENCOUNTER — Encounter: Payer: Self-pay | Admitting: Adult Health

## 2013-09-14 VITALS — BP 111/71 | HR 76 | Temp 98.1°F | Resp 20 | Ht 63.5 in | Wt 137.9 lb

## 2013-09-14 DIAGNOSIS — N649 Disorder of breast, unspecified: Secondary | ICD-10-CM

## 2013-09-14 DIAGNOSIS — Z853 Personal history of malignant neoplasm of breast: Secondary | ICD-10-CM

## 2013-09-14 DIAGNOSIS — Z170 Estrogen receptor positive status [ER+]: Secondary | ICD-10-CM

## 2013-09-14 DIAGNOSIS — C50419 Malignant neoplasm of upper-outer quadrant of unspecified female breast: Secondary | ICD-10-CM

## 2013-09-14 NOTE — Patient Instructions (Signed)
Doing well.  Proceed with Mammogram in December.  Continue Tamoxifen daily.  No sign of recurrence.  If you are not taking a vitamin D supplement, we recommend 1000 IU daily.  Please call us if you have any questions or concerns.    We will see you back in 6 months or sooner if needed.

## 2013-09-14 NOTE — Telephone Encounter (Signed)
, °

## 2013-09-14 NOTE — Progress Notes (Signed)
OFFICE PROGRESS NOTE  CC**  Roxy Manns, MD [address] [place] [address] [place] [address]  DIAGNOSIS: 72 year old female with ER positive, PR positive, HER-2/neu negative, stage IA invasive ductal carcinoma of the right breast.    PRIOR THERAPY:  1. Patient had a mammogram in May, 2012 that detected a right brest mass, that went on to be biopsied and demonstrated invasive ductal carcinoma, ER 100%, PR 22%, HER-2/neu negative, with a Ki-67 of 17%.    2.  Patient underwent lumpectomy by Dr. Jamey Ripa, a 1.4 cm grade 1 invasive ductal carcinoma was removed, 2 sentinel nodes were biopsied and negative.    3. Patient completed radiation therapy with Dr. Michell Heinrich in August, 2012, and was started on Tamoxifen therapy after radiation completion.    CURRENT THERAPY: Tamoxifen daily  INTERVAL HISTORY: Gabrielle Webb 72 y.o. female returns for followup and evaluation for her stage I breast cancer.  She recently had a colonoscopy in August, 2014, and is concerned that a sessile polyp was removed.  followup was recommended in 5 years.  She is planning on starting colace 2 tabs per day to help with the pain involved with defecation.  She's very sad today as she lost her husband recently, and the last time she was here was a consultation for his metastatic cancer.  She is very overwhelmed by the psychosocial stressors of losing her husband and her mom being ill.  She is taking the tamoxifen daily and tolerating it well.  She denies fevers, chills, night sweats, unintentional weight loss, new pain, or any further concerns.  She does have an occasional hot flash.    MEDICAL HISTORY: Past Medical History  Diagnosis Date  . Allergy   . Anxiety   . GERD (gastroesophageal reflux disease)   . Osteoporosis   . Diverticulosis of colon   . Vertigo   . Hypothyroid   . Breast cancer 7/12    Right  . Skin cancer     basal and squamous cell  . Cellulitis   . Hx of radiation therapy  06/20/11 - 07/11/11    right breast    ALLERGIES:  is allergic to codeine and morphine.  MEDICATIONS:  Current Outpatient Prescriptions  Medication Sig Dispense Refill  . ALPRAZolam (XANAX) 0.25 MG tablet Take 1 tablet (0.25 mg total) by mouth daily as needed for sleep or anxiety.  30 tablet  3  . aspirin 81 MG tablet Take 81 mg by mouth daily.       . chlorthalidone (HYGROTON) 50 MG tablet Take 1 tablet (50 mg total) by mouth daily as needed. For swelling.  90 tablet  0  . Cholecalciferol (VITAMIN D) 2000 UNITS CAPS Take 1 capsule by mouth daily.      Marland Kitchen dicyclomine (BENTYL) 10 MG capsule Take 1 capsule (10 mg total) by mouth 2 (two) times daily as needed.  180 capsule  0  . Flaxseed, Linseed, (FLAX SEED OIL PO) Take 1 capsule by mouth daily.        Marland Kitchen ibandronate (BONIVA) 150 MG tablet One tablet by mouth every 30 days in AM with full glass of water, on empty stomach,do not take anything else by mouth or lie down for 30 min.  3 tablet  1  . levothyroxine (SYNTHROID, LEVOTHROID) 112 MCG tablet Take 1 tablet (112 mcg total) by mouth daily.  30 tablet  3  . Multiple Vitamin (MULTIVITAMIN) capsule Take 1 capsule by mouth daily.        Marland Kitchen  niacin 500 MG tablet Take 500 mg by mouth daily.        . ranitidine (ZANTAC) 150 MG tablet Take 150 mg by mouth as needed.        . tamoxifen (NOLVADEX) 20 MG tablet Take 1 tablet (20 mg total) by mouth daily.  90 tablet  3  . vitamin E 400 UNIT capsule Take 400 Units by mouth daily.         No current facility-administered medications for this visit.    SURGICAL HISTORY:  Past Surgical History  Procedure Laterality Date  . Breast biopsy    . Knee surgery  1962  . Appendectomy  07/2007  . Breast lumpectomy Right 05/16/2011    REVIEW OF SYSTEMS:  A 10 point review of systems was conducted and is otherwise negative except for what is noted above.    HEALTH MAINTENANCE:  Mammogram 05/19/13, 5mm mass in left breast,  Biopsied and benign, follow up mammo in  December, 2014.   Colonoscopy  August, 2014 Bone density 11/2011, osteoporosis, on Boniva, repeat bone density scheduled in December, 2014.  Pap Smear June, 2014 Eye Exam this year Vitamin D May, 2014 Lipid Panel  May 2014  PHYSICAL EXAMINATION: Blood pressure 111/71, pulse 76, temperature 98.1 F (36.7 C), temperature source Oral, resp. rate 20, height 5' 3.5" (1.613 m), weight 137 lb 14.4 oz (62.551 kg), last menstrual period 12/03/1988. Body mass index is 24.04 kg/(m^2). General: Patient is a well appearing female in no acute distress HEENT: PERRLA, sclerae anicteric no conjunctival pallor, MMM Neck: supple, no palpable adenopathy Lungs: clear to auscultation bilaterally, no wheezes, rhonchi, or rales Cardiovascular: regular rate rhythm, S1, S2, no murmurs, rubs or gallops Abdomen: Soft, non-tender, non-distended, normoactive bowel sounds, no HSM Extremities: warm and well perfused, no clubbing, cyanosis, or edema Skin: No rashes or lesions Neuro: Non-focal Breasts: right breast lumpectomy site with no nodularity, masses, or skin changes, left breast, dense, no masses, nodularity, skin changes.   ECOG PERFORMANCE STATUS: 0 - Asymptomatic  LABORATORY DATA: Lab Results  Component Value Date   WBC 6.1 04/28/2013   HGB 12.7 04/28/2013   HCT 37.8 04/28/2013   MCV 90.3 04/28/2013   PLT 142.0* 04/28/2013      Chemistry      Component Value Date/Time   NA 142 04/28/2013 1132   NA 140 09/18/2012 0849   K 3.6 04/28/2013 1132   K 4.1 09/18/2012 0849   CL 107 04/28/2013 1132   CL 109* 09/18/2012 0849   CO2 26 04/28/2013 1132   CO2 24 09/18/2012 0849   BUN 11 04/28/2013 1132   BUN 16.0 09/18/2012 0849   CREATININE 0.7 04/28/2013 1132   CREATININE 0.8 09/18/2012 0849      Component Value Date/Time   CALCIUM 9.0 04/28/2013 1132   CALCIUM 8.9 09/18/2012 0849   ALKPHOS 27* 04/28/2013 1132   ALKPHOS 44 09/18/2012 0849   AST 18 04/28/2013 1132   AST 20 09/18/2012 0849   ALT 12 04/28/2013  1132   ALT 17 09/18/2012 0849   BILITOT 0.6 04/28/2013 1132   BILITOT 0.50 09/18/2012 0849       RADIOGRAPHIC STUDIES:  No results found.  ASSESSMENT:  Patient is a 72 year old female with h/o stage IA ER/PR positive invasive ductal carcinoma of the right breast.  She has underwent lumpectomy, adjuvant radiation therapy, and has been on Tamoxifen since August 2012.  She is tolerating it essentially well.     PLAN:  Patient is doing well and has no sign of recurrence.  She and I discussed her health maintenance.  She is tolerating the Tamoxifen well and will continue this.  We discussed survivorship and adverse effects of tamoxifen, which she has none.  She will follow up with [name] in 6 months for labs and an appointment.    All questions were answered. The patient knows to call the clinic with any problems, questions or concerns. We can certainly see the patient much sooner if necessary.  I spent 40 minutes counseling the patient face to face. The total time spent in the appointment was 60 minutes.   Larna Daughters, NP Medical Oncology [facility] Phone: [phone] 09/14/2013, 12:58 PM

## 2013-09-18 ENCOUNTER — Other Ambulatory Visit: Payer: Medicare Other | Admitting: Lab

## 2013-09-18 ENCOUNTER — Ambulatory Visit: Payer: Medicare Other | Admitting: Oncology

## 2013-09-22 ENCOUNTER — Other Ambulatory Visit: Payer: Self-pay | Admitting: *Deleted

## 2013-09-22 MED ORDER — ALPRAZOLAM 0.25 MG PO TABS: 0.2500 mg | ORAL_TABLET | Freq: Every day | ORAL | Status: DC | PRN

## 2013-09-22 NOTE — Telephone Encounter (Signed)
Pt came in with mother but need refill of med, pt said she has about 4 left please advise

## 2013-09-22 NOTE — Telephone Encounter (Signed)
Px written for call in   

## 2013-09-22 NOTE — Telephone Encounter (Signed)
Rx called in as prescribed and pt notified  

## 2013-10-19 ENCOUNTER — Other Ambulatory Visit: Payer: Self-pay | Admitting: *Deleted

## 2013-10-19 MED ORDER — IBANDRONATE SODIUM 150 MG PO TABS: 150.0000 mg | ORAL_TABLET | ORAL | Status: DC

## 2013-10-20 ENCOUNTER — Telehealth: Payer: Self-pay | Admitting: *Deleted

## 2013-10-20 NOTE — Telephone Encounter (Signed)
Per  MD notified pt to go to the urgent care or to see her PCP( Dr. Milinda Antis). Pt verbalized understanding, she will call her PCP today after we hang up. Informed pt if they are not able to see her, she may need to proceed to urgent care to be further evaluated. Pt verbalized understanding. No further concerns.

## 2013-10-20 NOTE — Telephone Encounter (Signed)
Patient either should go to the urgent care or see her PCP (Dr. Milinda Antis)

## 2013-10-20 NOTE — Telephone Encounter (Signed)
Pt called states "I'm having some pain on right side especially when I take a deep breath in, it hurts under my shoulder blade. I have been helping clean out my mother house, doing some lifting. i've tried ice, heat, nothing is helping." Pt requesting to be seen or what MD recommends. Last seen 10/13 next f/u 4/20

## 2013-10-21 ENCOUNTER — Telehealth: Payer: Self-pay

## 2013-10-21 ENCOUNTER — Ambulatory Visit (INDEPENDENT_AMBULATORY_CARE_PROVIDER_SITE_OTHER): Payer: Medicare Other | Admitting: Family Medicine

## 2013-10-21 ENCOUNTER — Encounter: Payer: Self-pay | Admitting: Family Medicine

## 2013-10-21 ENCOUNTER — Ambulatory Visit (INDEPENDENT_AMBULATORY_CARE_PROVIDER_SITE_OTHER)
Admission: RE | Admit: 2013-10-21 | Discharge: 2013-10-21 | Disposition: A | Discharge status: Home / Self Care | Payer: Medicare Other | Source: Ambulatory Visit | Attending: Family Medicine | Admitting: Family Medicine

## 2013-10-21 VITALS — BP 108/70 | HR 72 | Temp 97.9°F | Ht 63.5 in | Wt 138.5 lb

## 2013-10-21 DIAGNOSIS — M940 Chondrocostal junction syndrome [Tietze]: Secondary | ICD-10-CM

## 2013-10-21 DIAGNOSIS — Z853 Personal history of malignant neoplasm of breast: Secondary | ICD-10-CM

## 2013-10-21 DIAGNOSIS — M62838 Other muscle spasm: Secondary | ICD-10-CM

## 2013-10-21 DIAGNOSIS — R079 Chest pain, unspecified: Secondary | ICD-10-CM

## 2013-10-21 NOTE — Progress Notes (Signed)
Pre-visit discussion using our clinic review tool. No additional management support is needed unless otherwise documented below in the visit note.  

## 2013-10-21 NOTE — Telephone Encounter (Signed)
Patient notified of chest x-ray results

## 2013-10-21 NOTE — Telephone Encounter (Signed)
Pt left v/m requesting cb when gets CXR results from this morning.

## 2013-10-21 NOTE — Progress Notes (Signed)
Date:  10/21/2013   Name:  Gabrielle Webb   DOB:  [birth year]   MRN:  [id number] Gender: female Age: 72 y.o.  Primary Physician:  Roxy Manns, MD   Chief Complaint: upper back pain   Subjective:   History of Present Illness:  Gabrielle Webb is a 72 y.o. pleasant patient who presents with the following:  Picking up her mother's house, and has pulled a muscle. Hurts underneath her right scapula and pain with taking a deep breath.  Primarily this is centered directly underneath her scapula on the RIGHT side. Primarily on the inferior border. This is primarily also in the soft tissue. She has been doing a lot of lifting and moving.  2012 breast cancer. Lumpectomy and radiation. Took out lymph nodes.  Mammogram Dec. 18th.   Massage. Heat.    Patient Active Problem List   Diagnosis Date Noted  . Encounter for routine gynecological examination 05/06/2013  . Colon cancer screening 05/06/2013  . Encounter for Medicare annual wellness exam 04/28/2013  . Hx of radiation therapy   . Hematuria 09/12/2012  . Breast cancer   . Gynecological examination 12/25/2011  . Hx of Breast cancer, stage 1, Right, UOQ, Receptor+,Her2- 05/29/2011    Class: Stage 1  . Other screening mammogram 03/21/2011  . Post-menopausal 03/21/2011  . HYPOTHYROIDISM 01/23/2008  . Pure hypercholesterolemia 01/23/2008  . ANXIETY 01/23/2008  . ALLERGIC RHINITIS 01/23/2008  . GERD 01/23/2008  . DIVERTICULOSIS, COLON 01/23/2008  . IBS 01/23/2008  . FIBROCYSTIC BREAST DISEASE 01/23/2008  . OSTEOPOROSIS 10/17/2007    Past Medical History  Diagnosis Date  . Allergy   . Anxiety   . GERD (gastroesophageal reflux disease)   . Osteoporosis   . Diverticulosis of colon   . Vertigo   . Hypothyroid   . Breast cancer 7/12    Right  . Skin cancer     basal and squamous cell  . Cellulitis   . Hx of radiation therapy 06/20/11 - 07/11/11    right breast    Past Surgical History  Procedure Laterality Date  .  Breast biopsy    . Knee surgery  1962  . Appendectomy  07/2007  . Breast lumpectomy Right 05/16/2011    History   Social History  . Marital Status: Married    Spouse Name: N/A    Number of Children: 1  . Years of Education: N/A   Occupational History  . insurance agency    Social History Main Topics  . Smoking status: Never Smoker   . Smokeless tobacco: Never Used  . Alcohol Use: No  . Drug Use: No  . Sexual Activity: Not on file   Other Topics Concern  . Not on file   Social History Narrative   Exercises on [employer] elliptical    Family History  Problem Relation Age of Onset  . Heart attack Father   . Hypertension Father   . Atrial fibrillation Mother   . Coronary artery disease Mother   . Diabetes      Grandmother  . Coronary artery disease      Grandmother  . Uterine cancer      Grandmother  . Leukemia      Aunt  . Cancer Maternal Aunt     leukemia  . Cancer Maternal Uncle     colon    Allergies  Allergen Reactions  . Codeine Nausea And Vomiting  . Morphine Nausea And Vomiting    Medication list has  been reviewed and updated.  Review of Systems:  GEN: No fevers, chills. Nontoxic. Primarily MSK c/o today. MSK: Detailed in the HPI GI: tolerating PO intake without difficulty Neuro: No numbness, parasthesias, or tingling associated. Otherwise the pertinent positives of the ROS are noted above.   Objective:   Physical Examination: BP 108/70  Pulse 72  Temp(Src) 97.9 F (36.6 C) (Oral)  Ht 5' 3.5" (1.613 m)  Wt 138 lb 8 oz (62.823 kg)  BMI 24.15 kg/m2  SpO2 98%  LMP 12/03/1988  Ideal Body Weight: Weight in (lb) to have BMI = 25: 143.1   GEN: WDWN, NAD, Non-toxic, A & O x 3 HEENT: Atraumatic, Normocephalic. Neck supple. No masses, No LAD. Ears and Nose: No external deformity. CV: RRR, No M/G/R. No JVD. No thrill. No extra heart sounds. PULM: CTA B, no wheezes, crackles, rhonchi. No retractions. No resp. distress. No accessory muscle  use. EXTR: No c/c/e NEURO Normal gait.  PSYCH: Normally interactive. Conversant. Not depressed or anxious appearing.  Calm demeanor.   Chest wall is mildly tender. A chaperone exam shows on the RIGHT side no significant lymphadenopathy in the armpit region. There is some mild tenderness around the latissimus dorsi on the RIGHT as well as the serratus anterior. There is also around the inferior aspect of the trapezius on the RIGHT as well as the rhomboid some tenderness to palpation. Patient also has some triceps tenderness.  Dg Chest 2 View  10/21/2013   CLINICAL DATA:  Chest pain.  EXAM: CHEST  2 VIEW  COMPARISON:  May 14, 2011.  FINDINGS: Mild hyperexpansion of the lungs is noted suggesting chronic obstructive pulmonary disease. Cardiomediastinal silhouette appears normal. No acute pulmonary disease is noted. No pleural effusion or pneumothorax is noted. Bony thorax is intact.  IMPRESSION: Hyperexpansion of the lungs consistent with chronic obstructive pulmonary disease. No acute cardiopulmonary abnormality seen.   Electronically Signed   By: Roque Lias M.D.   On: 10/21/2013 12:40    Assessment & Plan:    Trapezius muscle spasm  Costochondritis  Chest pain - Plan: DG Chest 2 View  Hx of Breast cancer, stage 1, Right, UOQ, Receptor+,Her2-  this is all musculoskeletal. I tried to reassure the patient and reviewed some basic stretching for her.  There are no Patient Instructions on file for this visit.  Orders Today:  Orders Placed This Encounter  Procedures  . DG Chest 2 View    New medications, updates to list, dose adjustments: Meds ordered this encounter  Medications  . ibuprofen (ADVIL,MOTRIN) 200 MG tablet    Sig: Take 400 mg by mouth daily.    Signed,  Elpidio Galea. Magdala Brahmbhatt, MD, CAQ Sports Medicine  [facility] [address] [place] [address] Phone: [phone] Fax: [phone]  Updated Complete Medication List:   Medication List         This list is accurate as of: 10/21/13 11:59 PM.  Always use your most recent med list.               ALPRAZolam 0.25 MG tablet  Commonly known as:  XANAX  Take 1 tablet (0.25 mg total) by mouth daily as needed for sleep or anxiety.     aspirin 81 MG tablet  Take 81 mg by mouth daily.     chlorthalidone 50 MG tablet  Commonly known as:  HYGROTON  Take 1 tablet (50 mg total) by mouth daily as needed. For swelling.  dicyclomine 10 MG capsule  Commonly known as:  BENTYL  Take 1 capsule (10 mg total) by mouth 2 (two) times daily as needed.     FLAX SEED OIL PO  Take 1 capsule by mouth daily.     ibandronate 150 MG tablet  Commonly known as:  BONIVA  Take 1 tablet (150 mg total) by mouth every 30 (thirty) days. Take in AM with full glass of water, on empty stomach,do not take anything else by mouth or lie down for 30 min.     ibuprofen 200 MG tablet  Commonly known as:  ADVIL,MOTRIN  Take 400 mg by mouth daily.     levothyroxine 112 MCG tablet  Commonly known as:  SYNTHROID, LEVOTHROID  Take 1 tablet (112 mcg total) by mouth daily.     multivitamin capsule  Take 1 capsule by mouth daily.     niacin 500 MG tablet  Take 500 mg by mouth daily.     ranitidine 150 MG tablet  Commonly known as:  ZANTAC  Take 150 mg by mouth as needed.     tamoxifen 20 MG tablet  Commonly known as:  NOLVADEX  Take 1 tablet (20 mg total) by mouth daily.     Vitamin D 2000 UNITS Caps  Take 1 capsule by mouth daily.     vitamin E 400 UNIT capsule  Take 400 Units by mouth daily.

## 2013-11-12 ENCOUNTER — Other Ambulatory Visit: Payer: Medicare Other

## 2013-11-18 ENCOUNTER — Ambulatory Visit
Admission: RE | Admit: 2013-11-18 | Discharge: 2013-11-18 | Disposition: A | Discharge status: Home / Self Care | Payer: Medicare Other | Source: Ambulatory Visit | Attending: Oncology | Admitting: Oncology

## 2013-11-18 ENCOUNTER — Ambulatory Visit
Admission: RE | Admit: 2013-11-18 | Discharge: 2013-11-18 | Disposition: A | Discharge status: Home / Self Care | Payer: Medicare Other | Source: Ambulatory Visit | Attending: Radiation Oncology | Admitting: Radiation Oncology

## 2013-11-18 DIAGNOSIS — C50911 Malignant neoplasm of unspecified site of right female breast: Secondary | ICD-10-CM

## 2013-11-18 DIAGNOSIS — N649 Disorder of breast, unspecified: Secondary | ICD-10-CM

## 2013-11-19 ENCOUNTER — Other Ambulatory Visit: Payer: Medicare Other

## 2013-11-20 ENCOUNTER — Encounter: Payer: Self-pay | Admitting: *Deleted

## 2013-12-07 ENCOUNTER — Other Ambulatory Visit: Payer: Self-pay | Admitting: *Deleted

## 2013-12-07 DIAGNOSIS — Z853 Personal history of malignant neoplasm of breast: Secondary | ICD-10-CM

## 2013-12-07 MED ORDER — TAMOXIFEN CITRATE 20 MG PO TABS: 20.0000 mg | ORAL_TABLET | Freq: Every day | ORAL | Status: DC

## 2013-12-14 ENCOUNTER — Other Ambulatory Visit: Payer: Self-pay

## 2013-12-14 NOTE — Telephone Encounter (Signed)
Pt request 90 day refills to [facility]. Pt request cb when refills done.

## 2013-12-14 NOTE — Telephone Encounter (Signed)
Please refill all for 6 months -thanks

## 2013-12-15 ENCOUNTER — Telehealth: Payer: Self-pay

## 2013-12-15 ENCOUNTER — Other Ambulatory Visit: Payer: Self-pay | Admitting: *Deleted

## 2013-12-15 DIAGNOSIS — Z853 Personal history of malignant neoplasm of breast: Secondary | ICD-10-CM

## 2013-12-15 MED ORDER — CHLORTHALIDONE 50 MG PO TABS: 50.0000 mg | ORAL_TABLET | Freq: Every day | ORAL | Status: DC | PRN

## 2013-12-15 MED ORDER — DICYCLOMINE HCL 10 MG PO CAPS: 10.0000 mg | ORAL_CAPSULE | Freq: Two times a day (BID) | ORAL | Status: DC | PRN

## 2013-12-15 MED ORDER — TAMOXIFEN CITRATE 20 MG PO TABS: 20.0000 mg | ORAL_TABLET | Freq: Every day | ORAL | Status: DC

## 2013-12-15 MED ORDER — LEVOTHYROXINE SODIUM 112 MCG PO TABS: 112.0000 ug | ORAL_TABLET | Freq: Every day | ORAL | Status: DC

## 2013-12-15 NOTE — Telephone Encounter (Signed)
done

## 2013-12-15 NOTE — Telephone Encounter (Signed)
Pt called back to ck on refill for Tamoxifen; advised need to contact Dr Humphrey Rolls office since he refilled on 12/07/13. Pt wants changed from local pharmacy to [facility]. Pt voiced understanding.

## 2014-02-15 ENCOUNTER — Telehealth: Payer: Self-pay

## 2014-02-15 NOTE — Telephone Encounter (Signed)
Calling pt about her mother not her, addressed through phone note on mother's chart

## 2014-02-15 NOTE — Telephone Encounter (Signed)
Pt left v/m returning call to [facility] and request cb [phone].

## 2014-03-22 ENCOUNTER — Ambulatory Visit (HOSPITAL_BASED_OUTPATIENT_CLINIC_OR_DEPARTMENT_OTHER): Payer: Commercial Managed Care - HMO | Admitting: Oncology

## 2014-03-22 ENCOUNTER — Other Ambulatory Visit (HOSPITAL_BASED_OUTPATIENT_CLINIC_OR_DEPARTMENT_OTHER): Payer: Commercial Managed Care - HMO

## 2014-03-22 ENCOUNTER — Telehealth: Payer: Self-pay | Admitting: Oncology

## 2014-03-22 ENCOUNTER — Encounter: Payer: Self-pay | Admitting: Oncology

## 2014-03-22 VITALS — BP 104/71 | HR 93 | Temp 98.3°F | Resp 18 | Ht 63.0 in | Wt 136.9 lb

## 2014-03-22 DIAGNOSIS — C50419 Malignant neoplasm of upper-outer quadrant of unspecified female breast: Secondary | ICD-10-CM

## 2014-03-22 DIAGNOSIS — Z170 Estrogen receptor positive status [ER+]: Secondary | ICD-10-CM

## 2014-03-22 DIAGNOSIS — Z853 Personal history of malignant neoplasm of breast: Secondary | ICD-10-CM

## 2014-03-22 DIAGNOSIS — C50919 Malignant neoplasm of unspecified site of unspecified female breast: Secondary | ICD-10-CM

## 2014-03-22 LAB — CBC WITH DIFFERENTIAL/PLATELET
BASO%: 0.1 % (ref 0.0–2.0)
Basophils Absolute: 0 10*3/uL (ref 0.0–0.1)
EOS%: 1.2 % (ref 0.0–7.0)
Eosinophils Absolute: 0.1 10*3/uL (ref 0.0–0.5)
HCT: 39.9 % (ref 34.8–46.6)
HGB: 13.3 g/dL (ref 11.6–15.9)
LYMPH%: 31.9 % (ref 14.0–49.7)
MCH: 30.7 pg (ref 25.1–34.0)
MCHC: 33.3 g/dL (ref 31.5–36.0)
MCV: 92.1 fL (ref 79.5–101.0)
MONO#: 0.6 10*3/uL (ref 0.1–0.9)
MONO%: 8.5 % (ref 0.0–14.0)
NEUT#: 4.1 10*3/uL (ref 1.5–6.5)
NEUT%: 58.3 % (ref 38.4–76.8)
PLATELETS: 154 10*3/uL (ref 145–400)
RBC: 4.33 10*6/uL (ref 3.70–5.45)
RDW: 13.1 % (ref 11.2–14.5)
WBC: 7 10*3/uL (ref 3.9–10.3)
lymph#: 2.2 10*3/uL (ref 0.9–3.3)

## 2014-03-22 LAB — COMPREHENSIVE METABOLIC PANEL (CC13)
ALK PHOS: 36 U/L — AB (ref 40–150)
ALT: 13 U/L (ref 0–55)
AST: 21 U/L (ref 5–34)
Albumin: 4 g/dL (ref 3.5–5.0)
Anion Gap: 9 mEq/L (ref 3–11)
BILIRUBIN TOTAL: 0.36 mg/dL (ref 0.20–1.20)
BUN: 18.1 mg/dL (ref 7.0–26.0)
CO2: 28 mEq/L (ref 22–29)
Calcium: 10 mg/dL (ref 8.4–10.4)
Chloride: 106 mEq/L (ref 98–109)
Creatinine: 0.9 mg/dL (ref 0.6–1.1)
Glucose: 82 mg/dl (ref 70–140)
Potassium: 4.1 mEq/L (ref 3.5–5.1)
SODIUM: 143 meq/L (ref 136–145)
TOTAL PROTEIN: 7 g/dL (ref 6.4–8.3)

## 2014-03-22 NOTE — Patient Instructions (Signed)
Continue  Your medications as you are  We will see you back in 1 year

## 2014-03-22 NOTE — Telephone Encounter (Signed)
gv pt appt schedule for april 2016.

## 2014-04-05 ENCOUNTER — Other Ambulatory Visit: Payer: Self-pay | Admitting: Family Medicine

## 2014-04-05 NOTE — Telephone Encounter (Signed)
Px written for call in   

## 2014-04-05 NOTE — Telephone Encounter (Signed)
Rx called in as prescribed 

## 2014-04-05 NOTE — Telephone Encounter (Signed)
Electronic refill request, please advise  

## 2014-04-19 ENCOUNTER — Other Ambulatory Visit: Payer: Self-pay | Admitting: Adult Health

## 2014-04-19 DIAGNOSIS — Z853 Personal history of malignant neoplasm of breast: Secondary | ICD-10-CM

## 2014-04-20 ENCOUNTER — Other Ambulatory Visit: Payer: Self-pay | Admitting: Adult Health

## 2014-04-20 ENCOUNTER — Other Ambulatory Visit: Payer: Self-pay

## 2014-04-20 DIAGNOSIS — Z853 Personal history of malignant neoplasm of breast: Secondary | ICD-10-CM

## 2014-05-03 ENCOUNTER — Telehealth: Payer: Self-pay | Admitting: Family Medicine

## 2014-05-03 DIAGNOSIS — R319 Hematuria, unspecified: Secondary | ICD-10-CM

## 2014-05-03 DIAGNOSIS — M810 Age-related osteoporosis without current pathological fracture: Secondary | ICD-10-CM

## 2014-05-03 DIAGNOSIS — E7800 Pure hypercholesterolemia, unspecified: Secondary | ICD-10-CM

## 2014-05-03 DIAGNOSIS — E039 Hypothyroidism, unspecified: Secondary | ICD-10-CM

## 2014-05-03 NOTE — Telephone Encounter (Signed)
Message copied by Abner Greenspan on Mon May 03, 2014  8:49 AM ------      Message from: Marchia Bond      Created: Thu Apr 29, 2014  5:46 PM      Regarding: Cpx labs 05/04/14       Please order  future cpx labs for pt's upcoming lab appt.      Thanks      Tasha       ------

## 2014-05-04 ENCOUNTER — Other Ambulatory Visit (INDEPENDENT_AMBULATORY_CARE_PROVIDER_SITE_OTHER): Payer: Commercial Managed Care - HMO

## 2014-05-04 ENCOUNTER — Encounter: Payer: Self-pay | Admitting: *Deleted

## 2014-05-04 DIAGNOSIS — M810 Age-related osteoporosis without current pathological fracture: Secondary | ICD-10-CM

## 2014-05-04 DIAGNOSIS — R319 Hematuria, unspecified: Secondary | ICD-10-CM

## 2014-05-04 DIAGNOSIS — E039 Hypothyroidism, unspecified: Secondary | ICD-10-CM

## 2014-05-04 DIAGNOSIS — E7800 Pure hypercholesterolemia, unspecified: Secondary | ICD-10-CM

## 2014-05-04 LAB — COMPREHENSIVE METABOLIC PANEL
ALK PHOS: 32 U/L — AB (ref 39–117)
ALT: 12 U/L (ref 0–35)
AST: 21 U/L (ref 0–37)
Albumin: 4 g/dL (ref 3.5–5.2)
BILIRUBIN TOTAL: 0.7 mg/dL (ref 0.2–1.2)
BUN: 15 mg/dL (ref 6–23)
CO2: 29 mEq/L (ref 19–32)
CREATININE: 0.8 mg/dL (ref 0.4–1.2)
Calcium: 9.3 mg/dL (ref 8.4–10.5)
Chloride: 105 mEq/L (ref 96–112)
GFR: 74.09 mL/min (ref >60.00)
GLUCOSE: 87 mg/dL (ref 70–99)
Potassium: 4.3 mEq/L (ref 3.5–5.1)
SODIUM: 140 meq/L (ref 135–145)
TOTAL PROTEIN: 6.6 g/dL (ref 6.0–8.3)

## 2014-05-04 LAB — LIPID PANEL
CHOLESTEROL: 185 mg/dL (ref 0–200)
HDL: 57.2 mg/dL (ref >39.00)
LDL CALC: 105 mg/dL — AB (ref 0–99)
Total CHOL/HDL Ratio: 3
Triglycerides: 115 mg/dL (ref 0.0–149.0)
VLDL: 23 mg/dL (ref 0.0–40.0)

## 2014-05-04 LAB — CBC WITH DIFFERENTIAL/PLATELET
BASOS ABS: 0 10*3/uL (ref 0.0–0.1)
Basophils Relative: 0.3 % (ref 0.0–3.0)
Eosinophils Absolute: 0.1 10*3/uL (ref 0.0–0.7)
Eosinophils Relative: 1.3 % (ref 0.0–5.0)
HEMATOCRIT: 38.9 % (ref 36.0–46.0)
Hemoglobin: 13.1 g/dL (ref 12.0–15.0)
LYMPHS ABS: 2.6 10*3/uL (ref 0.7–4.0)
Lymphocytes Relative: 38 % (ref 12.0–46.0)
MCHC: 33.8 g/dL (ref 30.0–36.0)
MCV: 93.3 fl (ref 78.0–100.0)
MONO ABS: 0.6 10*3/uL (ref 0.1–1.0)
Monocytes Relative: 8.2 % (ref 3.0–12.0)
NEUTROS ABS: 3.6 10*3/uL (ref 1.4–7.7)
Neutrophils Relative %: 52.2 % (ref 43.0–77.0)
PLATELETS: 149 10*3/uL — AB (ref 150.0–400.0)
RBC: 4.17 Mil/uL (ref 3.87–5.11)
RDW: 13.5 % (ref 11.5–15.5)
WBC: 6.9 10*3/uL (ref 4.0–10.5)

## 2014-05-04 LAB — TSH: TSH: 3.91 u[IU]/mL (ref 0.35–4.50)

## 2014-05-05 LAB — VITAMIN D 25 HYDROXY (VIT D DEFICIENCY, FRACTURES): VIT D 25 HYDROXY: 36 ng/mL (ref 30–89)

## 2014-05-11 ENCOUNTER — Other Ambulatory Visit (HOSPITAL_COMMUNITY)
Admission: RE | Admit: 2014-05-11 | Discharge: 2014-05-11 | Disposition: A | Discharge status: Home / Self Care | Payer: Medicare PPO | Source: Ambulatory Visit | Attending: Family Medicine | Admitting: Family Medicine

## 2014-05-11 ENCOUNTER — Encounter: Payer: Self-pay | Admitting: Family Medicine

## 2014-05-11 ENCOUNTER — Ambulatory Visit (INDEPENDENT_AMBULATORY_CARE_PROVIDER_SITE_OTHER): Payer: Commercial Managed Care - HMO | Admitting: Family Medicine

## 2014-05-11 VITALS — BP 116/78 | HR 66 | Temp 97.7°F | Ht 63.25 in | Wt 137.5 lb

## 2014-05-11 DIAGNOSIS — M810 Age-related osteoporosis without current pathological fracture: Secondary | ICD-10-CM

## 2014-05-11 DIAGNOSIS — Z23 Encounter for immunization: Secondary | ICD-10-CM

## 2014-05-11 DIAGNOSIS — E039 Hypothyroidism, unspecified: Secondary | ICD-10-CM

## 2014-05-11 DIAGNOSIS — Z0000 Encounter for general adult medical examination without abnormal findings: Secondary | ICD-10-CM

## 2014-05-11 DIAGNOSIS — E7800 Pure hypercholesterolemia, unspecified: Secondary | ICD-10-CM

## 2014-05-11 DIAGNOSIS — Z124 Encounter for screening for malignant neoplasm of cervix: Secondary | ICD-10-CM | POA: Insufficient documentation

## 2014-05-11 DIAGNOSIS — Z01419 Encounter for gynecological examination (general) (routine) without abnormal findings: Secondary | ICD-10-CM

## 2014-05-11 NOTE — Progress Notes (Signed)
Subjective:    Patient ID: Gabrielle Webb, female    DOB: Nov 28, 1941, 73 y.o.   MRN: [id number]  HPI I have personally reviewed the Medicare Annual Wellness questionnaire and have noted 1. The patient's medical and social history 2. Their use of alcohol, tobacco or illicit drugs 3. Their current medications and supplements 4. The patient's functional ability including ADL's, fall risks, home safety risks and hearing or visual             impairment. 5. Diet and physical activities 6. Evidence for depression or mood disorders  The patients weight, height, BMI have been recorded in the chart and visual acuity is per eye clinic.  I have made referrals, counseling and provided education to the patient based review of the above and I have provided the pt with a written personalized care plan for preventive services.  Doing ok overall   She felt poorly last week with uri symptoms/ then ear pain and on Friday - had a migraine  Feeling better now  Wants to check ear today -still pressure    She occasionally has a stinging sensation   See scanned forms.  Routine anticipatory guidance given to patient.  See health maintenance. Colon cancer screening 8/14 colonosc - 5 year recall (polyp) Breast cancer screening 12.14 - 6 mo f/u and has her appt 6/22 for that  Self breast exam -no changes on self exam  Sees Dr Chancy Milroy next April -following for past breast cancer  Her oncologist wanted her to get a pap every year  Flu vaccine 9/14  Tetanus vaccine 1/07 Pneumovax- due for now  Zoster vaccine zoster 12/08   Advance directive -has a living will  Cognitive function addressed- see scanned forms- and if abnormal then additional documentation follows. -no worries about that   PMH and SH reviewed  Meds, vitals, and allergies reviewed.   ROS: See HPI.  Otherwise negative.    boniva cost 60$ per pill -=less affordable now -- has been on bisphosphenate  OP dexa 12/14- gets every 2 years    Was on tamoxifen No fractures in the past year   Hypothyroidism  Pt has no clinical changes No change in energy level/ hair or skin/ edema and no tremor Lab Results  Component Value Date   TSH 3.91 05/04/2014     D level is 36 - sometimes she forgets to take it   Hyperlipidemia Lab Results  Component Value Date   CHOL 185 05/04/2014   CHOL 163 04/28/2013   CHOL 197 12/18/2011   Lab Results  Component Value Date   HDL 57.20 05/04/2014   HDL 58.50 04/28/2013   HDL 51.60 12/18/2011   Lab Results  Component Value Date   LDLCALC 105* 05/04/2014   LDLCALC 89 04/28/2013   LDLCALC 117* 12/18/2011   Lab Results  Component Value Date   TRIG 115.0 05/04/2014   TRIG 76.0 04/28/2013   TRIG 141.0 12/18/2011   Lab Results  Component Value Date   CHOLHDL 3 05/04/2014   CHOLHDL 3 04/28/2013   CHOLHDL 4 12/18/2011   Lab Results  Component Value Date   LDLDIRECT 142.0 03/19/2011   LDLDIRECT 125.4 12/08/2009   LDLDIRECT 172.0 09/19/2009   in good control    Patient Active Problem List   Diagnosis Date Noted  . Encounter for routine gynecological examination 05/06/2013  . Colon cancer screening 05/06/2013  . Encounter for Medicare annual wellness exam 04/28/2013  . Hx of radiation therapy   .  Hematuria 09/12/2012  . Breast cancer   . Gynecological examination 12/25/2011  . Hx of Breast cancer, stage 1, Right, UOQ, Receptor+,Her2- 05/29/2011    Class: Stage 1  . Other screening mammogram 03/21/2011  . Post-menopausal 03/21/2011  . HYPOTHYROIDISM 01/23/2008  . Pure hypercholesterolemia 01/23/2008  . ANXIETY 01/23/2008  . ALLERGIC RHINITIS 01/23/2008  . GERD 01/23/2008  . DIVERTICULOSIS, COLON 01/23/2008  . IBS 01/23/2008  . FIBROCYSTIC BREAST DISEASE 01/23/2008  . OSTEOPOROSIS 10/17/2007   Past Medical History  Diagnosis Date  . Allergy   . Anxiety   . GERD (gastroesophageal reflux disease)   . Osteoporosis   . Diverticulosis of colon   . Vertigo   . Hypothyroid   . Breast cancer  7/12    Right  . Skin cancer     basal and squamous cell  . Cellulitis   . Hx of radiation therapy 06/20/11 - 07/11/11    right breast   Past Surgical History  Procedure Laterality Date  . Breast biopsy    . Knee surgery  1962  . Appendectomy  07/2007  . Breast lumpectomy Right 05/16/2011   History  Substance Use Topics  . Smoking status: Never Smoker   . Smokeless tobacco: Never Used  . Alcohol Use: No   Family History  Problem Relation Age of Onset  . Heart attack Father   . Hypertension Father   . Atrial fibrillation Mother   . Coronary artery disease Mother   . Diabetes      Grandmother  . Coronary artery disease      Grandmother  . Uterine cancer      Grandmother  . Leukemia      Aunt  . Cancer Maternal Aunt     leukemia  . Cancer Maternal Uncle     colon   Allergies  Allergen Reactions  . Codeine Nausea And Vomiting  . Morphine Nausea And Vomiting   Current Outpatient Prescriptions on File Prior to Visit  Medication Sig Dispense Refill  . ALPRAZolam (XANAX) 0.25 MG tablet TAKE ONE TABLET EVERY DAY AS NEEDED FOR ANXIETY/SLEEP  30 tablet  3  . aspirin 81 MG tablet Take 81 mg by mouth daily.       . chlorthalidone (HYGROTON) 50 MG tablet Take 1 tablet (50 mg total) by mouth daily as needed. For swelling.  90 tablet  1  . Cholecalciferol (VITAMIN D) 2000 UNITS CAPS Take 1 capsule by mouth daily.      Marland Kitchen dicyclomine (BENTYL) 10 MG capsule Take 1 capsule (10 mg total) by mouth 2 (two) times daily as needed.  180 capsule  1  . Flaxseed, Linseed, (FLAX SEED OIL PO) Take 1 capsule by mouth daily.        Marland Kitchen levothyroxine (SYNTHROID, LEVOTHROID) 112 MCG tablet Take 1 tablet (112 mcg total) by mouth daily.  90 tablet  1  . Multiple Vitamin (MULTIVITAMIN) capsule Take 1 capsule by mouth daily.        . niacin 500 MG tablet Take 500 mg by mouth daily.        . ranitidine (ZANTAC) 150 MG tablet Take 150 mg by mouth as needed.        . tamoxifen (NOLVADEX) 20 MG tablet Take 1  tablet (20 mg total) by mouth daily.  90 tablet  1  . vitamin E 400 UNIT capsule Take 400 Units by mouth daily.         No current facility-administered medications on file prior  to visit.    Review of Systems Review of Systems  Constitutional: Negative for fever, appetite change, fatigue and unexpected weight change.  Eyes: Negative for pain and visual disturbance.  ENt pos for ear pressure  Respiratory: Negative for cough and shortness of breath.   Cardiovascular: Negative for cp or palpitations    Gastrointestinal: Negative for nausea, diarrhea and constipation.  Genitourinary: Negative for urgency and frequency.  Skin: Negative for pallor or rash   Neurological: Negative for weakness, light-headedness, numbness and headaches.  Hematological: Negative for adenopathy. Does not bruise/bleed easily.  Psychiatric/Behavioral: Negative for dysphoric mood. The patient is not nervous/anxious.         Objective:   Physical Exam  Constitutional: She appears well-developed and well-nourished. No distress.  HENT:  Head: Normocephalic and atraumatic.  Right Ear: External ear normal.  Left Ear: External ear normal.  Mouth/Throat: Oropharynx is clear and moist.  TMs are clear No sinus tenderness   Eyes: Conjunctivae and EOM are normal. Pupils are equal, round, and reactive to light. No scleral icterus.  Neck: Normal range of motion. Neck supple. No JVD present. Carotid bruit is not present. No thyromegaly present.  Cardiovascular: Normal rate, regular rhythm, normal heart sounds and intact distal pulses.  Exam reveals no gallop.   Pulmonary/Chest: Effort normal and breath sounds normal. No respiratory distress. She has no wheezes. She exhibits no tenderness.  Abdominal: Soft. Bowel sounds are normal. She exhibits no distension, no abdominal bruit and no mass. There is no tenderness.  Genitourinary: No breast swelling, tenderness, discharge or bleeding.  o External genitalia -nl app o  Urethral meatus nl app o Urethra  Nl and mobile o Bladder nt  o Vagina nl app/ no d/c or mucosal change o Cervix  Not friable/ no d/c  o Uterus  nontender , not enl  o Adnexa/parametria nl ,nontender, no M o Anus and perineum nl appearing  Breast exam: No mass, nodules, thickening, tenderness, bulging, retraction, inflamation, nipple discharge or skin changes noted.  No axillary or clavicular LA.    (baseline surg change on R)  Musculoskeletal: Normal range of motion. She exhibits no edema and no tenderness.  No kyphosis  Lymphadenopathy:    She has no cervical adenopathy.  Neurological: She is alert. She has normal reflexes. No cranial nerve deficit. She exhibits normal muscle tone. Coordination normal.  Skin: Skin is warm and dry. No rash noted. No erythema. No pallor.  Psychiatric: She has a normal mood and affect.          Assessment & Plan:

## 2014-05-11 NOTE — Progress Notes (Signed)
Pre visit review using our clinic review tool, if applicable. No additional management support is needed unless otherwise documented below in the visit note. 

## 2014-05-11 NOTE — Patient Instructions (Addendum)
Pneumonia vaccine today Stop your boniva since you have been on that class of medicine for long enough Don't forget your vitamin D Pap done today  Take care of yourself

## 2014-05-13 LAB — CYTOLOGY - PAP

## 2014-05-13 NOTE — Assessment & Plan Note (Signed)
Hypothyroidism  Pt has no clinical changes No change in energy level/ hair or skin/ edema and no tremor Lab Results  Component Value Date   TSH 3.91 05/04/2014

## 2014-05-13 NOTE — Assessment & Plan Note (Signed)
Reviewed health habits including diet and exercise and skin cancer prevention Reviewed appropriate screening tests for age  Also reviewed health mt list, fam hx and immunization status , as well as social and family history   See HPI Lab rev  

## 2014-05-13 NOTE — Assessment & Plan Note (Signed)
Disc goals for lipids and reasons to control them Rev labs with pt Rev low sat fat diet in detail   

## 2014-05-13 NOTE — Assessment & Plan Note (Signed)
In pt prev on tamoxifen Has had 5 y of boniva-will stop No fx Disc need for calcium/ vitamin D/ wt bearing exercise and bone density test every 2 y to monitor Disc safety/ fracture risk in detail

## 2014-05-13 NOTE — Assessment & Plan Note (Signed)
Exam done with pap  Pt will ask oncol if we need to continue annually

## 2014-05-18 ENCOUNTER — Telehealth: Payer: Self-pay | Admitting: Family Medicine

## 2014-05-18 DIAGNOSIS — Z9221 Personal history of antineoplastic chemotherapy: Secondary | ICD-10-CM

## 2014-05-18 DIAGNOSIS — R87619 Unspecified abnormal cytological findings in specimens from cervix uteri: Secondary | ICD-10-CM

## 2014-05-18 DIAGNOSIS — Z9229 Personal history of other drug therapy: Secondary | ICD-10-CM

## 2014-05-18 NOTE — Telephone Encounter (Signed)
Ref to GYN for eval of endomet cells in pap in pt formerly on tamoxifen

## 2014-05-18 NOTE — Telephone Encounter (Signed)
Message copied by Abner Greenspan on Tue May 18, 2014 10:37 PM ------      Message from: Tammi Sou      Created: Tue May 18, 2014  4:51 PM       Pt notified of pap results. She agrees with referral if possible she would like to see gyn doc in [place].            Pt wanted to know if she should worry about pap smear results (could it be cancer?), or if this could be a side eff of taking tamoxifen, pt said that her oncologist doc advise her that toamoxifen side eff could be vaginal discharge so she wasn't sure if this was from the vaginal discharge, pt is very nervous and concerned please advise ------

## 2014-05-20 ENCOUNTER — Telehealth: Payer: Self-pay

## 2014-05-20 NOTE — Telephone Encounter (Signed)
Returned pt call.  LMVOM letting pt know per her request I would notify scheduling and give Dr. Jana Hakim message that she wants to be transferred to him.  Pt to call clinic if she has any questions.   Routed to Dr. Jana Hakim and Vernetta Honey

## 2014-05-21 ENCOUNTER — Encounter: Payer: Self-pay | Admitting: Family Medicine

## 2014-05-24 ENCOUNTER — Encounter (INDEPENDENT_AMBULATORY_CARE_PROVIDER_SITE_OTHER): Payer: Self-pay

## 2014-05-24 ENCOUNTER — Ambulatory Visit
Admission: RE | Admit: 2014-05-24 | Discharge: 2014-05-24 | Disposition: A | Discharge status: Home / Self Care | Payer: Commercial Managed Care - HMO | Source: Ambulatory Visit | Attending: Adult Health | Admitting: Adult Health

## 2014-05-24 ENCOUNTER — Other Ambulatory Visit: Payer: Self-pay | Admitting: Adult Health

## 2014-05-24 DIAGNOSIS — Z853 Personal history of malignant neoplasm of breast: Secondary | ICD-10-CM

## 2014-05-26 NOTE — H&P (Signed)
Gabrielle Webb is an 73 y.o. G 1 P 1 with recent pap that showed endometrial cells. Ultrasound in the office revealed endometrial thickness of 1.93 cm. She is on tamoxifen.  Pertinent Gynecological History: Menses: post-menopausal Bleeding: none Contraception: none DES exposure: denies Blood transfusions: none Sexually transmitted diseases: no past history Previous GYN Procedures: none  Last mammogram: normal Date: 2014 Last pap: abnormal: endometrial cells Date: 2015 OB History: G1, P1   Menstrual History: Menarche age: unknown  Patient's last menstrual period was 12/03/1988.    Past Medical History  Diagnosis Date  . Allergy   . Anxiety   . GERD (gastroesophageal reflux disease)   . Osteoporosis   . Diverticulosis of colon   . Vertigo   . Hypothyroid   . Breast cancer 7/12    Right  . Skin cancer     basal and squamous cell  . Cellulitis   . Hx of radiation therapy 06/20/11 - 07/11/11    right breast    Past Surgical History  Procedure Laterality Date  . Breast biopsy    . Knee surgery  1962  . Appendectomy  07/2007  . Breast lumpectomy Right 05/16/2011    Family History  Problem Relation Age of Onset  . Heart attack Father   . Hypertension Father   . Atrial fibrillation Mother   . Coronary artery disease Mother   . Diabetes      Grandmother  . Coronary artery disease      Grandmother  . Uterine cancer      Grandmother  . Leukemia      Aunt  . Cancer Maternal Aunt     leukemia  . Cancer Maternal Uncle     colon    Social History:  reports that she has never smoked. She has never used smokeless tobacco. She reports that she does not drink alcohol or use illicit drugs.  Allergies:  Allergies  Allergen Reactions  . Codeine Nausea And Vomiting  . Morphine Nausea And Vomiting    No prescriptions prior to admission    Review of Systems  All other systems reviewed and are negative.   Last menstrual period 12/03/1988. Physical Exam  Nursing  note and vitals reviewed. Constitutional: She appears well-developed.  HENT:  Head: Normocephalic.  Eyes: Pupils are equal, round, and reactive to light.  Neck: Normal range of motion.  Cardiovascular: Normal rate.   Respiratory: Effort normal.  GI: Soft.  Genitourinary:  Cervix is stenotic  Unable to dilate cervix in the office    No results found for this or any previous visit (from the past 24 hour(s)).  Mm Diag Breast Tomo Bilateral  05/24/2014   CLINICAL DATA:  History of malignant lumpectomy of the right breast in 2012. Annual re-evaluation.  EXAM: DIGITAL DIAGNOSTIC BILATERAL MAMMOGRAM WITH TOMOSYNTHESIS AND CAD  DIGITAL BREAST TOMOSYNTHESIS  Digital breast tomosynthesis images are acquired in two projections. These images are reviewed in combination with the digital mammogram, confirming the findings below.  COMPARISON:  Previous examinations the most recent of which is dated 11/18/2013.  ACR Breast Density Category b: There are scattered areas of fibroglandular density.  FINDINGS: There are stable scarring changes located within the upper outer quadrant of the right breast related to the patient's lumpectomy. The mild asymmetry previously seen within the left breast appears stable. There are no findings worrisome for recurrent tumor or developing malignancy within either breast.  Mammographic images were processed with CAD.  IMPRESSION: Stable parenchymal pattern. No  findings worrisome for recurrent tumor or developing malignancy. Recommend bilateral diagnostic mammography in 1 year.  RECOMMENDATION: Bilateral diagnostic mammography in 1 year.  I have discussed the findings and recommendations with the patient. Results were also provided in writing at the conclusion of the visit. If applicable, a reminder letter will be sent to the patient regarding the next appointment.  BI-RADS CATEGORY  1: Negative.   Electronically Signed   By: Luberta Robertson M.D.   On: 05/24/2014 13:51     Assessment/Plan: Thickened endometrium History of breast cancer and currently on tamoxifen D and C with ultrasound guidance because of cervical stenosis Risks revealed Consent signed  GREWAL,MICHELLE L 05/26/2014, 9:58 AM

## 2014-05-27 ENCOUNTER — Encounter (HOSPITAL_COMMUNITY)
Admission: RE | Admit: 2014-05-27 | Discharge: 2014-05-27 | Disposition: A | Discharge status: Home / Self Care | Payer: Medicare PPO | Source: Ambulatory Visit | Attending: Obstetrics and Gynecology | Admitting: Obstetrics and Gynecology

## 2014-05-27 ENCOUNTER — Other Ambulatory Visit: Payer: Self-pay

## 2014-05-27 ENCOUNTER — Encounter (HOSPITAL_COMMUNITY): Payer: Self-pay

## 2014-05-27 DIAGNOSIS — Z01812 Encounter for preprocedural laboratory examination: Secondary | ICD-10-CM | POA: Insufficient documentation

## 2014-05-27 DIAGNOSIS — Z01818 Encounter for other preprocedural examination: Secondary | ICD-10-CM | POA: Insufficient documentation

## 2014-05-27 HISTORY — DX: Nausea with vomiting, unspecified: R11.2

## 2014-05-27 HISTORY — DX: Other specified postprocedural states: Z98.890

## 2014-05-27 LAB — CBC
HEMATOCRIT: 37.7 % (ref 36.0–46.0)
Hemoglobin: 12.7 g/dL (ref 12.0–15.0)
MCH: 31.3 pg (ref 26.0–34.0)
MCHC: 33.7 g/dL (ref 30.0–36.0)
MCV: 92.9 fL (ref 78.0–100.0)
Platelets: 154 10*3/uL (ref 150–400)
RBC: 4.06 MIL/uL (ref 3.87–5.11)
RDW: 12.9 % (ref 11.5–15.5)
WBC: 7.2 10*3/uL (ref 4.0–10.5)

## 2014-05-27 LAB — BASIC METABOLIC PANEL
BUN: 16 mg/dL (ref 6–23)
CALCIUM: 9.3 mg/dL (ref 8.4–10.5)
CO2: 27 mEq/L (ref 19–32)
Chloride: 105 mEq/L (ref 96–112)
Creatinine, Ser: 0.74 mg/dL (ref 0.50–1.10)
GFR, EST NON AFRICAN AMERICAN: 84 mL/min — AB (ref >90)
Glucose, Bld: 92 mg/dL (ref 70–99)
Potassium: 3.6 mEq/L — ABNORMAL LOW (ref 3.7–5.3)
Sodium: 141 mEq/L (ref 137–147)

## 2014-05-27 NOTE — Patient Instructions (Signed)
Philip  05/27/2014   Your procedure is scheduled on:  05/31/14  Enter through the Main Entrance of [facility] at [place] up the phone at the desk and dial [phone].   Call this number if you have problems the morning of surgery: [phone]   Remember:   Do not eat food:After Midnight.  Do not drink clear liquids: 4 Hours before arrival.  Take these medicines the morning of surgery with A SIP OF WATER: Tamoxifen and Zantac, may take Xanax if needed.   Do not wear jewelry, make-up or nail polish.  Do not wear lotions, powders, or perfumes. You may wear deodorant.  Do not shave 48 hours prior to surgery.  Do not bring valuables to the hospital.  [facility] is not   responsible for any belongings or valuables brought to the hospital.  Contacts, dentures or bridgework may not be worn into surgery.  Leave suitcase in the car. After surgery it may be brought to your room.  For patients admitted to the hospital, checkout time is 11:00 AM the day of              discharge.   Patients discharged the day of surgery will not be allowed to drive             home.  Name and phone number of your driver: undecided  Special Instructions:      Please read over the following fact sheets that you were given:   Surgical Site Infection Prevention

## 2014-05-31 ENCOUNTER — Ambulatory Visit (HOSPITAL_COMMUNITY): Payer: Medicare PPO

## 2014-05-31 ENCOUNTER — Encounter (HOSPITAL_COMMUNITY): Payer: Medicare PPO | Admitting: Anesthesiology

## 2014-05-31 ENCOUNTER — Ambulatory Visit (HOSPITAL_COMMUNITY)
Admission: RE | Admit: 2014-05-31 | Discharge: 2014-05-31 | Disposition: A | Discharge status: Home / Self Care | Payer: Medicare PPO | Source: Ambulatory Visit | Attending: Obstetrics and Gynecology | Admitting: Obstetrics and Gynecology

## 2014-05-31 ENCOUNTER — Ambulatory Visit (HOSPITAL_COMMUNITY): Payer: Medicare PPO | Admitting: Anesthesiology

## 2014-05-31 ENCOUNTER — Encounter (HOSPITAL_COMMUNITY)
Admission: RE | Disposition: A | Discharge status: Home / Self Care | Payer: Self-pay | Source: Ambulatory Visit | Attending: Obstetrics and Gynecology

## 2014-05-31 ENCOUNTER — Encounter (HOSPITAL_COMMUNITY): Payer: Self-pay | Admitting: Anesthesiology

## 2014-05-31 DIAGNOSIS — M810 Age-related osteoporosis without current pathological fracture: Secondary | ICD-10-CM

## 2014-05-31 DIAGNOSIS — C50911 Malignant neoplasm of unspecified site of right female breast: Secondary | ICD-10-CM

## 2014-05-31 DIAGNOSIS — Z853 Personal history of malignant neoplasm of breast: Secondary | ICD-10-CM

## 2014-05-31 DIAGNOSIS — F411 Generalized anxiety disorder: Secondary | ICD-10-CM

## 2014-05-31 DIAGNOSIS — Z01419 Encounter for gynecological examination (general) (routine) without abnormal findings: Secondary | ICD-10-CM

## 2014-05-31 DIAGNOSIS — E7800 Pure hypercholesterolemia, unspecified: Secondary | ICD-10-CM

## 2014-05-31 DIAGNOSIS — K589 Irritable bowel syndrome without diarrhea: Secondary | ICD-10-CM

## 2014-05-31 DIAGNOSIS — E039 Hypothyroidism, unspecified: Secondary | ICD-10-CM

## 2014-05-31 DIAGNOSIS — Z780 Asymptomatic menopausal state: Secondary | ICD-10-CM

## 2014-05-31 DIAGNOSIS — Z9221 Personal history of antineoplastic chemotherapy: Secondary | ICD-10-CM

## 2014-05-31 DIAGNOSIS — M4802 Spinal stenosis, cervical region: Secondary | ICD-10-CM | POA: Insufficient documentation

## 2014-05-31 DIAGNOSIS — K219 Gastro-esophageal reflux disease without esophagitis: Secondary | ICD-10-CM | POA: Insufficient documentation

## 2014-05-31 DIAGNOSIS — R9389 Abnormal findings on diagnostic imaging of other specified body structures: Secondary | ICD-10-CM | POA: Insufficient documentation

## 2014-05-31 DIAGNOSIS — Z1211 Encounter for screening for malignant neoplasm of colon: Secondary | ICD-10-CM

## 2014-05-31 DIAGNOSIS — Z0000 Encounter for general adult medical examination without abnormal findings: Secondary | ICD-10-CM

## 2014-05-31 DIAGNOSIS — Z1231 Encounter for screening mammogram for malignant neoplasm of breast: Secondary | ICD-10-CM

## 2014-05-31 DIAGNOSIS — K5730 Diverticulosis of large intestine without perforation or abscess without bleeding: Secondary | ICD-10-CM

## 2014-05-31 DIAGNOSIS — J309 Allergic rhinitis, unspecified: Secondary | ICD-10-CM

## 2014-05-31 DIAGNOSIS — R319 Hematuria, unspecified: Secondary | ICD-10-CM

## 2014-05-31 DIAGNOSIS — Z9229 Personal history of other drug therapy: Secondary | ICD-10-CM

## 2014-05-31 DIAGNOSIS — Z923 Personal history of irradiation: Secondary | ICD-10-CM

## 2014-05-31 HISTORY — PX: DILATION AND CURETTAGE OF UTERUS: SHX78

## 2014-05-31 SURGERY — DILATION AND CURETTAGE
Anesthesia: Monitor Anesthesia Care | Site: Vagina

## 2014-05-31 MED ORDER — KETOROLAC TROMETHAMINE 30 MG/ML IJ SOLN
INTRAMUSCULAR | Status: DC | PRN
  Administered 2014-05-31: 15 mg via INTRAVENOUS

## 2014-05-31 MED ORDER — FENTANYL CITRATE 0.05 MG/ML IJ SOLN
INTRAMUSCULAR | Status: AC
  Filled 2014-05-31: qty 5

## 2014-05-31 MED ORDER — LIDOCAINE HCL (CARDIAC) 20 MG/ML IV SOLN
INTRAVENOUS | Status: AC
  Filled 2014-05-31: qty 5

## 2014-05-31 MED ORDER — ONDANSETRON HCL 4 MG/2ML IJ SOLN
INTRAMUSCULAR | Status: AC
  Filled 2014-05-31: qty 2

## 2014-05-31 MED ORDER — CEFAZOLIN SODIUM-DEXTROSE 2-3 GM-% IV SOLR
2.0000 g | INTRAVENOUS | Status: AC
  Administered 2014-05-31: 2 g via INTRAVENOUS

## 2014-05-31 MED ORDER — LIDOCAINE HCL 1 % IJ SOLN
INTRAMUSCULAR | Status: DC | PRN
  Administered 2014-05-31: 10 mL

## 2014-05-31 MED ORDER — ACETAMINOPHEN 10 MG/ML IV SOLN
1000.0000 mg | Freq: Once | INTRAVENOUS | Status: AC
  Administered 2014-05-31: 1000 mg via INTRAVENOUS
  Filled 2014-05-31 (×2): qty 100

## 2014-05-31 MED ORDER — LIDOCAINE HCL 1 % IJ SOLN
INTRAMUSCULAR | Status: AC
  Filled 2014-05-31: qty 20

## 2014-05-31 MED ORDER — KETOROLAC TROMETHAMINE 30 MG/ML IJ SOLN
INTRAMUSCULAR | Status: AC
  Filled 2014-05-31: qty 1

## 2014-05-31 MED ORDER — LACTATED RINGERS IV SOLN: INTRAVENOUS | Status: DC

## 2014-05-31 MED ORDER — LIDOCAINE HCL (CARDIAC) 20 MG/ML IV SOLN
INTRAVENOUS | Status: DC | PRN
  Administered 2014-05-31: 40 mg via INTRAVENOUS

## 2014-05-31 MED ORDER — PROPOFOL 10 MG/ML IV EMUL
INTRAVENOUS | Status: AC
  Filled 2014-05-31: qty 20

## 2014-05-31 MED ORDER — LACTATED RINGERS IV SOLN
INTRAVENOUS | Status: DC
  Administered 2014-05-31 (×2): via INTRAVENOUS

## 2014-05-31 MED ORDER — PROPOFOL 10 MG/ML IV EMUL
INTRAVENOUS | Status: DC | PRN
  Administered 2014-05-31: 30 mg via INTRAVENOUS
  Administered 2014-05-31: 50 mg via INTRAVENOUS
  Administered 2014-05-31 (×2): 40 mg via INTRAVENOUS

## 2014-05-31 MED ORDER — ONDANSETRON HCL 4 MG/2ML IJ SOLN
INTRAMUSCULAR | Status: DC | PRN
  Administered 2014-05-31: 4 mg via INTRAVENOUS

## 2014-05-31 MED ORDER — CEFAZOLIN SODIUM-DEXTROSE 2-3 GM-% IV SOLR
INTRAVENOUS | Status: AC
  Filled 2014-05-31: qty 50

## 2014-05-31 SURGICAL SUPPLY — 11 items
CATH ROBINSON RED A/P 16FR (CATHETERS) ×3 IMPLANT
CLOTH BEACON ORANGE TIMEOUT ST (SAFETY) ×3 IMPLANT
CONTAINER PREFILL 10% NBF 60ML (FORM) ×6 IMPLANT
GLOVE BIO SURGEON STRL SZ 6.5 (GLOVE) ×4 IMPLANT
GLOVE BIO SURGEONS STRL SZ 6.5 (GLOVE) ×2
GOWN STRL REUS W/TWL LRG LVL3 (GOWN DISPOSABLE) ×6 IMPLANT
PACK VAGINAL MINOR WOMEN LF (CUSTOM PROCEDURE TRAY) ×3 IMPLANT
PAD OB MATERNITY 4.3X12.25 (PERSONAL CARE ITEMS) ×3 IMPLANT
PAD PREP 24X48 CUFFED NSTRL (MISCELLANEOUS) ×3 IMPLANT
TOWEL OR 17X24 6PK STRL BLUE (TOWEL DISPOSABLE) ×6 IMPLANT
WATER STERILE IRR 1000ML POUR (IV SOLUTION) ×3 IMPLANT

## 2014-05-31 NOTE — Anesthesia Postprocedure Evaluation (Signed)
  Anesthesia Post Note  Patient: Gabrielle Webb  Procedure(s) Performed: Procedure(s) (LRB): DILATATION AND CURETTAGE WITH ULTRASOUND GUIDANCE (N/A)  Anesthesia type: MAC  Patient location: PACU  Post pain: Pain level controlled  Post assessment: Post-op Vital signs reviewed  Last Vitals:  Filed Vitals:   05/31/14 1401  BP: 112/59  Pulse: 58  Temp: 36.4 C  Resp: 16    Post vital signs: Reviewed  Level of consciousness: sedated  Complications: No apparent anesthesia complications

## 2014-05-31 NOTE — Brief Op Note (Signed)
05/31/2014  1:50 PM  PATIENT:  Gabrielle Webb  73 y.o. female  PRE-OPERATIVE DIAGNOSIS:  THICKENED EM  POST-OPERATIVE DIAGNOSIS:  Thickened Endometrium  PROCEDURE:  Procedure(s): DILATATION AND CURETTAGE WITH ULTRASOUND GUIDANCE (N/A)  SURGEON:  Surgeon(s) and Role:    * Cyril Mourning, MD - Primary  PHYSICIAN ASSISTANT:   ASSISTANTS: none   ANESTHESIA:   paracervical block  EBL:  Total I/O In: 900 [I.V.:900] Out: -   BLOOD ADMINISTERED:none  DRAINS: none   LOCAL MEDICATIONS USED:  LIDOCAINE   SPECIMEN:  Source of Specimen:  uterine currettings  DISPOSITION OF SPECIMEN:  PATHOLOGY  COUNTS:  YES  TOURNIQUET:  * No tourniquets in log *  DICTATION: .Other Dictation: Dictation Number [id number]  PLAN OF CARE: Discharge to home after PACU  PATIENT DISPOSITION:  PACU - hemodynamically stable.   Delay start of Pharmacological VTE agent (>24hrs) due to surgical blood loss or risk of bleeding: not applicable

## 2014-05-31 NOTE — Discharge Instructions (Signed)
DISCHARGE INSTRUCTIONS: D&C / D&E The following instructions have been prepared to help you care for yourself upon your return home.  MAY TAKE IBUPROFEN (MOTRIN, ADVIL) OR ALEVE AFTER 8:00 PM FOR CRAMPS!!!   Personal hygiene:  Use sanitary pads for vaginal drainage, not tampons.  Shower the day after your procedure.  NO tub baths, pools or Jacuzzis for 2-3 weeks.  Wipe front to back after using the bathroom.  Activity and limitations:  Do NOT drive or operate any equipment for 24 hours. The effects of anesthesia are still present and drowsiness may result.  Do NOT rest in bed all day.  Walking is encouraged.  Walk up and down stairs slowly.  You may resume your normal activity in one to two days or as indicated by your physician.  Sexual activity: NO intercourse for at least 2 weeks after the procedure, or as indicated by your physician.  Diet: Eat a light meal as desired this evening. You may resume your usual diet tomorrow.  Return to work: You may resume your work activities in one to two days or as indicated by your doctor.  What to expect after your surgery: Expect to have vaginal bleeding/discharge for 2-3 days and spotting for up to 10 days. It is not unusual to have soreness for up to 1-2 weeks. You may have a slight burning sensation when you urinate for the first day. Mild cramps may continue for a couple of days. You may have a regular period in 2-6 weeks.  Call your doctor for any of the following:  Excessive vaginal bleeding, saturating and changing one pad every hour.  Inability to urinate 6 hours after discharge from hospital.  Pain not relieved by pain medication.  Fever of 100.4 F or greater.  Unusual vaginal discharge or odor.   Call for an appointment:    Patients signature: ______________________  Nurses signature ________________________  Support person's signature_______________________

## 2014-05-31 NOTE — Anesthesia Preprocedure Evaluation (Signed)
Anesthesia Evaluation  Patient identified by MRN, date of birth, ID band Patient awake    Reviewed: Allergy & Precautions, H&P , NPO status , Patient's Chart, lab work & pertinent test results, reviewed documented beta blocker date and time   Airway Mallampati: I TM Distance: >3 FB Neck ROM: full    Dental no notable dental hx. (+) Teeth Intact   Pulmonary neg pulmonary ROS,    Pulmonary exam normal       Cardiovascular negative cardio ROS      Neuro/Psych negative neurological ROS     GI/Hepatic Neg liver ROS, GERD-  Medicated and Controlled,  Endo/Other  Hypothyroidism   Renal/GU negative Renal ROS     Musculoskeletal   Abdominal Normal abdominal exam  (+)   Peds  Hematology negative hematology ROS (+)   Anesthesia Other Findings   Reproductive/Obstetrics negative OB ROS                           Anesthesia Physical Anesthesia Plan  ASA: II  Anesthesia Plan: MAC   Post-op Pain Management:    Induction: Intravenous  Airway Management Planned:   Additional Equipment:   Intra-op Plan:   Post-operative Plan:   Informed Consent: I have reviewed the patients History and Physical, chart, labs and discussed the procedure including the risks, benefits and alternatives for the proposed anesthesia with the patient or authorized representative who has indicated his/her understanding and acceptance.     Plan Discussed with: CRNA and Surgeon  Anesthesia Plan Comments: (Given patients history of N/V with GA and recent colonoscopy at [facility] without nausea, a MAC with Propofol would be appropriate given the procedure and the gynecologist performing it.)        Anesthesia Quick Evaluation

## 2014-05-31 NOTE — Transfer of Care (Signed)
Immediate Anesthesia Transfer of Care Note  Patient: Gabrielle Webb  Procedure(s) Performed: Procedure(s): DILATATION AND CURETTAGE WITH ULTRASOUND GUIDANCE (N/A)  Patient Location: PACU  Anesthesia Type:MAC  Level of Consciousness: awake, alert  and oriented  Airway & Oxygen Therapy: Patient Spontanous Breathing  Post-op Assessment: Report given to PACU RN and Post -op Vital signs reviewed and stable  Post vital signs: Reviewed and stable  Complications: No apparent anesthesia complications

## 2014-05-31 NOTE — Progress Notes (Signed)
H and P on the chart No significant changes Will proceed with D and C Consent signed 

## 2014-06-01 ENCOUNTER — Encounter (HOSPITAL_COMMUNITY): Payer: Self-pay | Admitting: Obstetrics and Gynecology

## 2014-06-01 NOTE — Op Note (Signed)
[name], Gabrielle Webb             ACCOUNT NO.:  [id number]  MEDICAL RECORD NO.:  [id number]  LOCATION:  [facility]                          FACILITY:  [facility]  PHYSICIAN:  Michelle L. Grewal, M.D.DATE OF BIRTH:  Jun 17, 1941  DATE OF PROCEDURE:  05/31/2014 DATE OF DISCHARGE:  05/31/2014                              OPERATIVE REPORT   PREOPERATIVE DIAGNOSES:  History of breast cancer of tamoxifen therapy, thickened endometrium, and cervical stenosis.  POSTOPERATIVE DIAGNOSES:  History of breast cancer of tamoxifen therapy, thickened endometrium, and cervical stenosis.  PROCEDURES:  D and C with ultrasound guidance.  SURGEON:  Michelle L. Helane Rima, M.D.  ANESTHESIA:  Paracervical block.  EBL:  Minimal.  COMPLICATIONS:  None.  DRAINS:  None.  PATHOLOGY:  Uterine curettings.  DESCRIPTION OF PROCEDURE:  The patient was taken to the operating room. She was then prepped and draped in usual sterile fashion.  In-and-out catheter was used to empty the bladder.  Using an ultrasound as guidance, the speculum was inserted into the vagina, the cervix was grasped with a tenaculum.  Paracervical block was performed in standard fashion.  The cervix was very stenotic and I used the ultrasound to guide me and I used Pratt dilators starting with the smallest to moving out until I dilated the cervical internal os.  A sharp curette was inserted and the uterus was thoroughly curetted of all tissue.  At the end of the procedure, vaginal bleeding was minimal.  All tissue was sent to Pathology.  The patient went to recovery room in stable condition.     Michelle L. Helane Rima, M.D.     [name]  D:  05/31/2014  T:  06/01/2014  Job:  [id number]

## 2014-06-02 ENCOUNTER — Other Ambulatory Visit: Payer: Self-pay | Admitting: Oncology

## 2014-06-02 DIAGNOSIS — C50919 Malignant neoplasm of unspecified site of unspecified female breast: Secondary | ICD-10-CM

## 2014-06-07 ENCOUNTER — Telehealth: Payer: Self-pay | Admitting: *Deleted

## 2014-06-07 ENCOUNTER — Other Ambulatory Visit: Payer: Self-pay | Admitting: *Deleted

## 2014-06-07 NOTE — Telephone Encounter (Signed)
Pt called to this RN last week to discuss GYN concerns under the care of Dr Helane Rima,.  Per discussion Gabrielle Webb states she had to have" a D&C in the hospital because of concerns and when Dr Helane Rima attempted the procedure in her office it didn't work "  " she says I am having thickening of my walls and this is why she did the procedure"  Note pt is currently on tamoxifen.  Above discussed with plan for pt to hold tamoxifen at present.  Will await results of procedure and an appointment will be made for follow up with MD for treatment decisions.  No other needs at this time.

## 2014-06-08 ENCOUNTER — Telehealth: Payer: Self-pay | Admitting: Oncology

## 2014-06-08 NOTE — Telephone Encounter (Signed)
, °

## 2014-06-18 ENCOUNTER — Telehealth: Payer: Self-pay | Admitting: Family Medicine

## 2014-06-18 DIAGNOSIS — Z9221 Personal history of antineoplastic chemotherapy: Secondary | ICD-10-CM

## 2014-06-18 DIAGNOSIS — Z9229 Personal history of other drug therapy: Secondary | ICD-10-CM

## 2014-06-18 DIAGNOSIS — Z853 Personal history of malignant neoplasm of breast: Secondary | ICD-10-CM

## 2014-06-18 NOTE — Telephone Encounter (Signed)
Patient called to request that you refer her to a Oncologist Dr Gypsy Balsam at [facility]. Patient was given her name by Dr Helane Rima. Recently patient had a D and C by Dr Helane Rima and was told that her cancer medicine tamoxifen can cause endometrial problems and that she might want to stop it. Her Oncologist has left [facility] and they have given her a new Dr Jana Hakim but she cant see him until September 1st b/c they are very short staffed with Dr's there. I called Dr Gracy Racer office and they have faxed notes and I will put them in your in box. Patient has [place] is not a [facility] provider so I dont know if this referral can get approved for her or not. Please place the referral and I will call for appt and send off for the Authorization from [place].

## 2014-06-18 NOTE — Telephone Encounter (Signed)
Referral done

## 2014-06-20 NOTE — Progress Notes (Signed)
OFFICE PROGRESS NOTE  CC**  Gabrielle Pardon, MD [place] [address], [place] [address]  DIAGNOSIS: 73 year old female with ER positive, PR positive, HER-2/neu negative, stage IA invasive ductal carcinoma of the right breast.    PRIOR THERAPY:  1. Patient had a mammogram in May, 2012 that detected a right brest mass, that went on to be biopsied and demonstrated invasive ductal carcinoma, ER 100%, PR 22%, HER-2/neu negative, with a Ki-67 of 17%.    2.  Patient underwent lumpectomy by Dr. Margot Chimes, a 1.4 cm grade 1 invasive ductal carcinoma was removed, 2 sentinel nodes were biopsied and negative.    3. Patient completed radiation therapy with Dr. Pablo Ledger in August, 2012, and was started on Tamoxifen therapy after radiation completion.    CURRENT THERAPY: Tamoxifen daily  INTERVAL HISTORY: Gabrielle Webb 73 y.o. female returns for followup   She is taking the tamoxifen daily and tolerating it well.  She denies fevers, chills, night sweats, unintentional weight loss, new pain, or any further concerns.  She does have an occasional hot flash.    MEDICAL HISTORY: Past Medical History  Diagnosis Date  . Allergy   . Anxiety   . GERD (gastroesophageal reflux disease)   . Osteoporosis   . Diverticulosis of colon   . Vertigo   . Hypothyroid   . Breast cancer 7/12    Right  . Skin cancer     basal and squamous cell  . Cellulitis   . Hx of radiation therapy 06/20/11 - 07/11/11    right breast  . PONV (postoperative nausea and vomiting)     ALLERGIES:  is allergic to codeine and morphine.  MEDICATIONS:  Current Outpatient Prescriptions  Medication Sig Dispense Refill  . aspirin 81 MG tablet Take 81 mg by mouth daily.       . chlorthalidone (HYGROTON) 50 MG tablet Take 1 tablet (50 mg total) by mouth daily as needed. For swelling.  90 tablet  1  . Cholecalciferol (VITAMIN D) 2000 UNITS CAPS Take 1 capsule by mouth daily.      Marland Kitchen dicyclomine (BENTYL) 10 MG  capsule Take 1 capsule (10 mg total) by mouth 2 (two) times daily as needed.  180 capsule  1  . levothyroxine (SYNTHROID, LEVOTHROID) 112 MCG tablet Take 1 tablet (112 mcg total) by mouth daily.  90 tablet  1  . Multiple Vitamin (MULTIVITAMIN) capsule Take 1 capsule by mouth daily.        . niacin 500 MG tablet Take 1,000 mg by mouth at bedtime.       . ranitidine (ZANTAC) 150 MG tablet Take 150 mg by mouth as needed.        . ALPRAZolam (XANAX) 0.25 MG tablet TAKE ONE TABLET EVERY DAY AS NEEDED FOR ANXIETY/SLEEP.      Marland Kitchen tamoxifen (NOLVADEX) 20 MG tablet TAKE 1 TABLET EVERY DAY  90 tablet  3   No current facility-administered medications for this visit.    SURGICAL HISTORY:  Past Surgical History  Procedure Laterality Date  . Breast biopsy    . Knee surgery  1962  . Appendectomy  07/2007  . Breast lumpectomy Right 05/16/2011  . Dilation and curettage of uterus N/A 05/31/2014    Procedure: DILATATION AND CURETTAGE WITH ULTRASOUND GUIDANCE;  Surgeon: Cyril Mourning, MD;  Location: [facility];  Service: Gynecology;  Laterality: N/A;    REVIEW OF SYSTEMS:  A 10 point review of systems was  conducted and is otherwise negative except for what is noted above.    PHYSICAL EXAMINATION: Blood pressure 104/71, pulse 93, temperature 98.3 F (36.8 C), temperature source Oral, resp. rate 18, height '5\' 3"'  (1.6 m), weight 136 lb 14.4 oz (62.097 kg), last menstrual period 12/03/1988. Body mass index is 24.26 kg/(m^2). General: Patient is a well appearing female in no acute distress HEENT: PERRLA, sclerae anicteric no conjunctival pallor, MMM Neck: supple, no palpable adenopathy Lungs: clear to auscultation bilaterally, no wheezes, rhonchi, or rales Cardiovascular: regular rate rhythm, S1, S2, no murmurs, rubs or gallops Abdomen: Soft, non-tender, non-distended, normoactive bowel sounds, no HSM Extremities: warm and well perfused, no clubbing, cyanosis, or edema Skin: No rashes or lesions Neuro:  Non-focal Breasts: right breast lumpectomy site with no nodularity, masses, or skin changes, left breast, dense, no masses, nodularity, skin changes.   ECOG PERFORMANCE STATUS: 0 - Asymptomatic   ASSESSMENT/PLAN: Patient is a 73 year old female with h/o stage IA ER/PR positive invasive ductal carcinoma of the right breast.  She has underwent lumpectomy, adjuvant radiation therapy, and has been on Tamoxifen since August 2012.  She is tolerating it essentially well.  Patient is doing well and has no sign of recurrence. She is tolerating the Tamoxifen well and will continue this.  We discussed survivorship and adverse effects of tamoxifen, which she has none.  She will follow up with [name] in 6 months for labs and an appointment.    All questions were answered. The patient knows to call the clinic with any problems, questions or concerns. We can certainly see the patient much sooner if necessary.  Marcy Panning, MD Medical/Oncology [facility] [phone] (beeper) [phone] (Office)

## 2014-06-22 ENCOUNTER — Other Ambulatory Visit: Payer: Self-pay | Admitting: *Deleted

## 2014-06-22 ENCOUNTER — Telehealth: Payer: Self-pay | Admitting: *Deleted

## 2014-06-22 NOTE — Telephone Encounter (Signed)
This RN received call from [facility] at Dr Carlyon Shadow office- requesting a return call due to pt's concerns with recent D&C and need to see an oncologist and pt's previous oncologist is no longer with this office.  " pt says she cannot be seen at your office until 08/03/2014 and is wondering if she needs to be set up with a different oncologist so she can be seen before 9/1 "  Return call number given for Rosaria Ferries is [phone].  This RN returned call to Rosaria Ferries- obtained identified VM- message left stating pt was advised to stay off tamoxifen for 8 weeks so new medication could be discussed at visit on 08/03/2014.  If pt wants to be seen before 08/03/2014 - appointment can be made with a covering provider or a referral can be placed for Dr Philipp Ovens at [facility].  This RN's name and return call number given as well.

## 2014-06-25 ENCOUNTER — Ambulatory Visit (INDEPENDENT_AMBULATORY_CARE_PROVIDER_SITE_OTHER): Payer: Commercial Managed Care - HMO | Admitting: Family Medicine

## 2014-06-25 ENCOUNTER — Encounter: Payer: Self-pay | Admitting: Family Medicine

## 2014-06-25 VITALS — BP 102/64 | HR 76 | Temp 98.7°F | Ht 63.25 in | Wt 140.2 lb

## 2014-06-25 DIAGNOSIS — R300 Dysuria: Secondary | ICD-10-CM

## 2014-06-25 DIAGNOSIS — N3000 Acute cystitis without hematuria: Secondary | ICD-10-CM

## 2014-06-25 DIAGNOSIS — N3001 Acute cystitis with hematuria: Secondary | ICD-10-CM

## 2014-06-25 DIAGNOSIS — N390 Urinary tract infection, site not specified: Secondary | ICD-10-CM | POA: Insufficient documentation

## 2014-06-25 LAB — POCT URINALYSIS DIPSTICK
Glucose, UA: NEGATIVE
Ketones, UA: NEGATIVE
Nitrite, UA: NEGATIVE
Spec Grav, UA: >=1.03
UROBILINOGEN UA: 0.2
pH, UA: 6

## 2014-06-25 MED ORDER — CIPROFLOXACIN HCL 250 MG PO TABS: 250.0000 mg | ORAL_TABLET | Freq: Two times a day (BID) | ORAL | Status: DC

## 2014-06-25 NOTE — Assessment & Plan Note (Signed)
With mild hematuria  Cover with cipro  Disc water intake cx sent Update if not starting to improve in a week or if worsening

## 2014-06-25 NOTE — Patient Instructions (Signed)
Drink lots of water Take the cipro as directed  Update if not starting to improve in a week or if worsening   We will alert you when urine culture returns     Urinary Tract Infection Urinary tract infections (UTIs) can develop anywhere along your urinary tract. Your urinary tract is your body's drainage system for removing wastes and extra water. Your urinary tract includes two kidneys, two ureters, a bladder, and a urethra. Your kidneys are a pair of bean-shaped organs. Each kidney is about the size of your fist. They are located below your ribs, one on each side of your spine. CAUSES Infections are caused by microbes, which are microscopic organisms, including fungi, viruses, and bacteria. These organisms are so small that they can only be seen through a microscope. Bacteria are the microbes that most commonly cause UTIs. SYMPTOMS  Symptoms of UTIs may vary by age and gender of the patient and by the location of the infection. Symptoms in young women typically include a frequent and intense urge to urinate and a painful, burning feeling in the bladder or urethra during urination. Older women and men are more likely to be tired, shaky, and weak and have muscle aches and abdominal pain. A fever may mean the infection is in your kidneys. Other symptoms of a kidney infection include pain in your back or sides below the ribs, nausea, and vomiting. DIAGNOSIS To diagnose a UTI, your caregiver will ask you about your symptoms. Your caregiver also will ask to provide a urine sample. The urine sample will be tested for bacteria and white blood cells. White blood cells are made by your body to help fight infection. TREATMENT  Typically, UTIs can be treated with medication. Because most UTIs are caused by a bacterial infection, they usually can be treated with the use of antibiotics. The choice of antibiotic and length of treatment depend on your symptoms and the type of bacteria causing your infection. HOME  CARE INSTRUCTIONS  If you were prescribed antibiotics, take them exactly as your caregiver instructs you. Finish the medication even if you feel better after you have only taken some of the medication.  Drink enough water and fluids to keep your urine clear or pale yellow.  Avoid caffeine, tea, and carbonated beverages. They tend to irritate your bladder.  Empty your bladder often. Avoid holding urine for long periods of time.  Empty your bladder before and after sexual intercourse.  After a bowel movement, women should cleanse from front to back. Use each tissue only once. SEEK MEDICAL CARE IF:   You have back pain.  You develop a fever.  Your symptoms do not begin to resolve within 3 days. SEEK IMMEDIATE MEDICAL CARE IF:   You have severe back pain or lower abdominal pain.  You develop chills.  You have nausea or vomiting.  You have continued burning or discomfort with urination. MAKE SURE YOU:   Understand these instructions.  Will watch your condition.  Will get help right away if you are not doing well or get worse. Document Released: 08/29/2005 Document Revised: 05/20/2012 Document Reviewed: 12/28/2011 [facility] Patient Information 2015 [place], [place]. This information is not intended to replace advice given to you by your health care provider. Make sure you discuss any questions you have with your health care provider.

## 2014-06-25 NOTE — Progress Notes (Signed)
Subjective:    Patient ID: Gabrielle Webb, female    DOB: 15-Oct-1941, 73 y.o.   MRN: [id number]  HPI Here for urinary symptoms   Yesterday felt tired and not right  Today - woke up with dysuria and also a small amt of blood in urine  Also frequency and urgency   Ever since D and C she has had a bit of spotting   She drinks one tea a day  Drinks water also   Results for orders placed in visit on 06/25/14  POCT URINALYSIS DIPSTICK      Result Value Ref Range   Color, UA Amber     Clarity, UA cloudy     Glucose, UA neg.     Bilirubin, UA 1+     Ketones, UA neg.     Spec Grav, UA >=1.030     Blood, UA Large     pH, UA 6.0     Protein, UA 30+     Urobilinogen, UA 0.2     Nitrite, UA neg.     Leukocytes, UA moderate (2+)      Patient Active Problem List   Diagnosis Date Noted  . Abnormal Pap smear of cervix 05/18/2014  . History of tamoxifen therapy 05/18/2014  . Encounter for routine gynecological examination 05/06/2013  . Colon cancer screening 05/06/2013  . Encounter for Medicare annual wellness exam 04/28/2013  . Hx of radiation therapy   . Hematuria 09/12/2012  . Breast cancer   . Gynecological examination 12/25/2011  . Hx of Breast cancer, stage 1, Right, UOQ, Receptor+,Her2- 05/29/2011    Class: Stage 1  . Other screening mammogram 03/21/2011  . Post-menopausal 03/21/2011  . HYPOTHYROIDISM 01/23/2008  . Pure hypercholesterolemia 01/23/2008  . ANXIETY 01/23/2008  . ALLERGIC RHINITIS 01/23/2008  . GERD 01/23/2008  . DIVERTICULOSIS, COLON 01/23/2008  . IBS 01/23/2008  . FIBROCYSTIC BREAST DISEASE 01/23/2008  . OSTEOPOROSIS 10/17/2007   Past Medical History  Diagnosis Date  . Allergy   . Anxiety   . GERD (gastroesophageal reflux disease)   . Osteoporosis   . Diverticulosis of colon   . Vertigo   . Hypothyroid   . Breast cancer 7/12    Right  . Skin cancer     basal and squamous cell  . Cellulitis   . Hx of radiation therapy 06/20/11 - 07/11/11   right breast  . PONV (postoperative nausea and vomiting)    Past Surgical History  Procedure Laterality Date  . Breast biopsy    . Knee surgery  1962  . Appendectomy  07/2007  . Breast lumpectomy Right 05/16/2011  . Dilation and curettage of uterus N/A 05/31/2014    Procedure: DILATATION AND CURETTAGE WITH ULTRASOUND GUIDANCE;  Surgeon: Cyril Mourning, MD;  Location: [facility];  Service: Gynecology;  Laterality: N/A;   History  Substance Use Topics  . Smoking status: Never Smoker   . Smokeless tobacco: Never Used  . Alcohol Use: No   Family History  Problem Relation Age of Onset  . Heart attack Father   . Hypertension Father   . Atrial fibrillation Mother   . Coronary artery disease Mother   . Diabetes      Grandmother  . Coronary artery disease      Grandmother  . Uterine cancer      Grandmother  . Leukemia      Aunt  . Cancer Maternal Aunt     leukemia  . Cancer Maternal Uncle  colon   Allergies  Allergen Reactions  . Codeine Nausea And Vomiting  . Morphine Nausea And Vomiting   Current Outpatient Prescriptions on File Prior to Visit  Medication Sig Dispense Refill  . ALPRAZolam (XANAX) 0.25 MG tablet TAKE ONE TABLET EVERY DAY AS NEEDED FOR ANXIETY/SLEEP.      Marland Kitchen aspirin 81 MG tablet Take 81 mg by mouth daily.       . chlorthalidone (HYGROTON) 50 MG tablet Take 1 tablet (50 mg total) by mouth daily as needed. For swelling.  90 tablet  1  . Cholecalciferol (VITAMIN D) 2000 UNITS CAPS Take 1 capsule by mouth daily.      Marland Kitchen dicyclomine (BENTYL) 10 MG capsule Take 1 capsule (10 mg total) by mouth 2 (two) times daily as needed.  180 capsule  1  . levothyroxine (SYNTHROID, LEVOTHROID) 112 MCG tablet Take 1 tablet (112 mcg total) by mouth daily.  90 tablet  1  . Multiple Vitamin (MULTIVITAMIN) capsule Take 1 capsule by mouth daily.        . niacin 500 MG tablet Take 1,000 mg by mouth at bedtime.       . ranitidine (ZANTAC) 150 MG tablet Take 150 mg by mouth as needed.          No current facility-administered medications on file prior to visit.      Review of Systems Review of Systems  Constitutional: Negative for fever, appetite change, fatigue and unexpected weight change.  Eyes: Negative for pain and visual disturbance.  Respiratory: Negative for cough and shortness of breath.   Cardiovascular: Negative for cp or palpitations    Gastrointestinal: Negative for nausea, diarrhea and constipation.  Genitourinary: pos for urgency and frequency. neg for flank pain or fever/ pos for vaginal spotting since her D and C Skin: Negative for pallor or rash   Neurological: Negative for weakness, light-headedness, numbness and headaches.  Hematological: Negative for adenopathy. Does not bruise/bleed easily.  Psychiatric/Behavioral: Negative for dysphoric mood. The patient is not nervous/anxious.         Objective:   Physical Exam  Constitutional: She appears well-developed and well-nourished. No distress.  HENT:  Head: Normocephalic and atraumatic.  Mouth/Throat: Oropharynx is clear and moist.  Eyes: Conjunctivae and EOM are normal. Pupils are equal, round, and reactive to light. No scleral icterus.  Neck: Normal range of motion. Neck supple.  Cardiovascular: Normal rate and regular rhythm.   Pulmonary/Chest: Effort normal and breath sounds normal. No respiratory distress. She has no wheezes. She has no rales.  Abdominal: Soft. Bowel sounds are normal. She exhibits no distension and no mass. There is tenderness. There is no rebound and no guarding.  Mild suprapubic tenderness No cva tenderness   Lymphadenopathy:    She has no cervical adenopathy.  Neurological: She is alert.  Skin: Skin is warm and dry. No rash noted. No erythema.  Psychiatric: She has a normal mood and affect.          Assessment & Plan:   Problem List Items Addressed This Visit     Genitourinary   UTI (urinary tract infection)     With mild hematuria  Cover with cipro  Disc  water intake cx sent Update if not starting to improve in a week or if worsening      Relevant Orders      Urine culture    Other Visit Diagnoses   Dysuria    -  Primary    Relevant Orders  POCT urinalysis dipstick (Completed)

## 2014-06-25 NOTE — Progress Notes (Signed)
Pre visit review using our clinic review tool, if applicable. No additional management support is needed unless otherwise documented below in the visit note. 

## 2014-06-28 LAB — URINE CULTURE

## 2014-07-08 NOTE — Telephone Encounter (Signed)
Patient was given an appt with Dr Deitra Mayo , Oncologist at [facility], also got Authorization from [facility] for patient to go there. Patient is aware and will cancel her appt with Dr Jana Hakim.

## 2014-08-03 ENCOUNTER — Ambulatory Visit: Payer: Commercial Managed Care - HMO | Admitting: Oncology

## 2014-09-22 ENCOUNTER — Other Ambulatory Visit: Payer: Self-pay | Admitting: Family Medicine

## 2014-12-08 ENCOUNTER — Other Ambulatory Visit: Payer: Self-pay | Admitting: *Deleted

## 2014-12-08 MED ORDER — CHLORTHALIDONE 50 MG PO TABS: 50.0000 mg | ORAL_TABLET | Freq: Every day | ORAL | Status: DC | PRN

## 2014-12-08 MED ORDER — LEVOTHYROXINE SODIUM 112 MCG PO TABS: 112.0000 ug | ORAL_TABLET | Freq: Every day | ORAL | Status: DC

## 2014-12-08 NOTE — Telephone Encounter (Signed)
Please refill times 6 mo

## 2014-12-08 NOTE — Telephone Encounter (Signed)
Pt has cpx scheduled in 6/16, Ok to refill?

## 2014-12-09 MED ORDER — DICYCLOMINE HCL 10 MG PO CAPS: 10.0000 mg | ORAL_CAPSULE | Freq: Two times a day (BID) | ORAL | Status: DC | PRN

## 2014-12-09 NOTE — Telephone Encounter (Signed)
done

## 2014-12-21 ENCOUNTER — Ambulatory Visit (INDEPENDENT_AMBULATORY_CARE_PROVIDER_SITE_OTHER): Payer: PPO | Admitting: Podiatry

## 2014-12-21 ENCOUNTER — Encounter: Payer: Self-pay | Admitting: Podiatry

## 2014-12-21 ENCOUNTER — Ambulatory Visit (INDEPENDENT_AMBULATORY_CARE_PROVIDER_SITE_OTHER): Payer: PPO

## 2014-12-21 VITALS — BP 126/71 | HR 68 | Resp 16 | Ht 64.0 in | Wt 140.0 lb

## 2014-12-21 DIAGNOSIS — M722 Plantar fascial fibromatosis: Secondary | ICD-10-CM

## 2014-12-21 MED ORDER — DICLOFENAC SODIUM 1 % TD GEL: 2.0000 g | Freq: Four times a day (QID) | TRANSDERMAL | Status: AC

## 2014-12-21 NOTE — Patient Instructions (Signed)
Plantar Fasciitis (Heel Spur Syndrome) with Rehab The plantar fascia is a fibrous, ligament-like, soft-tissue structure that spans the bottom of the foot. Plantar fasciitis is a condition that causes pain in the foot due to inflammation of the tissue. SYMPTOMS   Pain and tenderness on the underneath side of the foot.  Pain that worsens with standing or walking. CAUSES  Plantar fasciitis is caused by irritation and injury to the plantar fascia on the underneath side of the foot. Common mechanisms of injury include:  Direct trauma to bottom of the foot.  Damage to a small nerve that runs under the foot where the main fascia attaches to the heel bone.  Stress placed on the plantar fascia due to bone spurs. RISK INCREASES WITH:   Activities that place stress on the plantar fascia (running, jumping, pivoting, or cutting).  Poor strength and flexibility.  Improperly fitted shoes.  Tight calf muscles.  Flat feet.  Failure to warm-up properly before activity.  Obesity. PREVENTION  Warm up and stretch properly before activity.  Allow for adequate recovery between workouts.  Maintain physical fitness:  Strength, flexibility, and endurance.  Cardiovascular fitness.  Maintain a health body weight.  Avoid stress on the plantar fascia.  Wear properly fitted shoes, including arch supports for individuals who have flat feet. PROGNOSIS  If treated properly, then the symptoms of plantar fasciitis usually resolve without surgery. However, occasionally surgery is necessary. RELATED COMPLICATIONS   Recurrent symptoms that may result in a chronic condition.  Problems of the lower back that are caused by compensating for the injury, such as limping.  Pain or weakness of the foot during push-off following surgery.  Chronic inflammation, scarring, and partial or complete fascia tear, occurring more often from repeated injections. TREATMENT  Treatment initially involves the use of  ice and medication to help reduce pain and inflammation. The use of strengthening and stretching exercises may help reduce pain with activity, especially stretches of the Achilles tendon. These exercises may be performed at home or with a therapist. Your caregiver may recommend that you use heel cups of arch supports to help reduce stress on the plantar fascia. Occasionally, corticosteroid injections are given to reduce inflammation. If symptoms persist for greater than 6 months despite non-surgical (conservative), then surgery may be recommended.  MEDICATION   If pain medication is necessary, then nonsteroidal anti-inflammatory medications, such as aspirin and ibuprofen, or other minor pain relievers, such as acetaminophen, are often recommended.  Do not take pain medication within 7 days before surgery.  Prescription pain relievers may be given if deemed necessary by your caregiver. Use only as directed and only as much as you need.  Corticosteroid injections may be given by your caregiver. These injections should be reserved for the most serious cases, because they may only be given a certain number of times. HEAT AND COLD  Cold treatment (icing) relieves pain and reduces inflammation. Cold treatment should be applied for 10 to 15 minutes every 2 to 3 hours for inflammation and pain and immediately after any activity that aggravates your symptoms. Use ice packs or massage the area with a piece of ice (ice massage).  Heat treatment may be used prior to performing the stretching and strengthening activities prescribed by your caregiver, physical therapist, or athletic trainer. Use a heat pack or soak the injury in warm water. SEEK IMMEDIATE MEDICAL CARE IF:  Treatment seems to offer no benefit, or the condition worsens.  Any medications produce adverse side effects. EXERCISES RANGE   OF MOTION (ROM) AND STRETCHING EXERCISES - Plantar Fasciitis (Heel Spur Syndrome) These exercises may help you  when beginning to rehabilitate your injury. Your symptoms may resolve with or without further involvement from your physician, physical therapist or athletic trainer. While completing these exercises, remember:   Restoring tissue flexibility helps normal motion to return to the joints. This allows healthier, less painful movement and activity.  An effective stretch should be held for at least 30 seconds.  A stretch should never be painful. You should only feel a gentle lengthening or release in the stretched tissue. RANGE OF MOTION - Toe Extension, Flexion  Sit with your right / left leg crossed over your opposite knee.  Grasp your toes and gently pull them back toward the top of your foot. You should feel a stretch on the bottom of your toes and/or foot.  Hold this stretch for __________ seconds.  Now, gently pull your toes toward the bottom of your foot. You should feel a stretch on the top of your toes and or foot.  Hold this stretch for __________ seconds. Repeat __________ times. Complete this stretch __________ times per day.  RANGE OF MOTION - Ankle Dorsiflexion, Active Assisted  Remove shoes and sit on a chair that is preferably not on a carpeted surface.  Place right / left foot under knee. Extend your opposite leg for support.  Keeping your heel down, slide your right / left foot back toward the chair until you feel a stretch at your ankle or calf. If you do not feel a stretch, slide your bottom forward to the edge of the chair, while still keeping your heel down.  Hold this stretch for __________ seconds. Repeat __________ times. Complete this stretch __________ times per day.  STRETCH - Gastroc, Standing  Place hands on wall.  Extend right / left leg, keeping the front knee somewhat bent.  Slightly point your toes inward on your back foot.  Keeping your right / left heel on the floor and your knee straight, shift your weight toward the wall, not allowing your back to  arch.  You should feel a gentle stretch in the right / left calf. Hold this position for __________ seconds. Repeat __________ times. Complete this stretch __________ times per day. STRETCH - Soleus, Standing  Place hands on wall.  Extend right / left leg, keeping the other knee somewhat bent.  Slightly point your toes inward on your back foot.  Keep your right / left heel on the floor, bend your back knee, and slightly shift your weight over the back leg so that you feel a gentle stretch deep in your back calf.  Hold this position for __________ seconds. Repeat __________ times. Complete this stretch __________ times per day. STRETCH - Gastrocsoleus, Standing  Note: This exercise can place a lot of stress on your foot and ankle. Please complete this exercise only if specifically instructed by your caregiver.   Place the ball of your right / left foot on a step, keeping your other foot firmly on the same step.  Hold on to the wall or a rail for balance.  Slowly lift your other foot, allowing your body weight to press your heel down over the edge of the step.  You should feel a stretch in your right / left calf.  Hold this position for __________ seconds.  Repeat this exercise with a slight bend in your right / left knee. Repeat __________ times. Complete this stretch __________ times per day.    STRENGTHENING EXERCISES - Plantar Fasciitis (Heel Spur Syndrome)  These exercises may help you when beginning to rehabilitate your injury. They may resolve your symptoms with or without further involvement from your physician, physical therapist or athletic trainer. While completing these exercises, remember:   Muscles can gain both the endurance and the strength needed for everyday activities through controlled exercises.  Complete these exercises as instructed by your physician, physical therapist or athletic trainer. Progress the resistance and repetitions only as guided. STRENGTH -  Towel Curls  Sit in a chair positioned on a non-carpeted surface.  Place your foot on a towel, keeping your heel on the floor.  Pull the towel toward your heel by only curling your toes. Keep your heel on the floor.  If instructed by your physician, physical therapist or athletic trainer, add ____________________ at the end of the towel. Repeat __________ times. Complete this exercise __________ times per day. STRENGTH - Ankle Inversion  Secure one end of a rubber exercise band/tubing to a fixed object (table, pole). Loop the other end around your foot just before your toes.  Place your fists between your knees. This will focus your strengthening at your ankle.  Slowly, pull your big toe up and in, making sure the band/tubing is positioned to resist the entire motion.  Hold this position for __________ seconds.  Have your muscles resist the band/tubing as it slowly pulls your foot back to the starting position. Repeat __________ times. Complete this exercises __________ times per day.  Document Released: 11/19/2005 Document Revised: 02/11/2012 Document Reviewed: 03/03/2009 Exit[employer] Patient Information 2015 [employer]. This information is not intended to replace advice given to you by your health care provider. Make sure you discuss any questions you have with your health care provider.  

## 2014-12-21 NOTE — Progress Notes (Signed)
   Subjective:    Patient ID: Gabrielle Webb, female    DOB: June 01, 1941, 74 y.o.   MRN: [id number]  HPI Comments: 74 year old female presents the office today for complaints of right heel pain which has been ongoing since October 2014. She states that since that time the pain has been intermittent. She states it is starting to flare back this past Christmas that she is on her feet quite a bit. She has purchased over-the-counter inserts and gel heel cups as well as been stretching and icing the area without much improvement in symptoms. She denies any history of injury or trauma to the area. No other complaints at this time.  Foot Pain      Review of Systems  HENT: Positive for sinus pressure.        HEARING LOSS  Musculoskeletal:       DIFFICULTY WALKING  All other systems reviewed and are negative.      Objective:   Physical Exam AAO 3, NAD DP/PT pulses palpable bilaterally, CRT less than 3 seconds Protective sensation appears to be intact with Derrel Nip monofilament, vibratory sensation intact, Achilles tendon reflex intact. There is tenderness palpation over the plantar medial tubercle of the calcaneus at the insertion of the plantar fascia on the right foot. There is no pain along the course of plantar fascial in the arch of the foot. No pain with lateral compression of the calcaneus or pain with vibratory sensation. No pain on the posterior aspect of the calcaneus or along the course/insertion of the Achilles tendon. No other areas of tenderness to bilateral lower extremity's. No overlying edema, erythema, increase in warmth to bilateral lower extremities. MMT 5/5, ROM WNL No open lesions or pre-ulcerative lesions are then applied. No pain with calf compression, swelling, warmth, erythema.       Assessment & Plan:  74 year old female with right foot heel pain, likely plantar fasciitis. -X-rays were obtained and reviewed the patient. -Treatment options were discussed  the patient including alternatives, risks, complications. -Discussed possible steroid injection. However the patient does have an allergy to prednisone and given her osteoporosis we'll hold off on the injection at this time includes tinea other treatments. Discussed with her that if the other treatments do not resolve the symptoms can try a small dose of steroid to the area. -Plantar fascial taping applied. -Dispensed night splint. -Discussed stretching exercises to continue -Ice to the area. -Prescribed Voltaren gel.  -She is already taking anti-inflammatory medication and recommended to continue this. -Follow-up in 3 weeks or sooner should any problems arise. In the meantime, encouraged call the office with any questions, concerns, change in symptoms.

## 2014-12-22 ENCOUNTER — Telehealth: Payer: Self-pay

## 2014-12-22 NOTE — Telephone Encounter (Signed)
Just received PA today, form placed in your inbox

## 2014-12-22 NOTE — Telephone Encounter (Signed)
Butch Penny with [facility] left v/m requesting status of PA for dicyclomine. If [facility] has not received the PA please call and request another form or PA can be completed over the phone.

## 2014-12-22 NOTE — Telephone Encounter (Signed)
Done and in IN box 

## 2014-12-23 NOTE — Telephone Encounter (Signed)
PA faxed

## 2014-12-30 NOTE — Telephone Encounter (Signed)
Seth Bake with [employer] left v/m; Prior auth for dicyclomine was not responded to and [employer] needs to send over an appeal. Seth Bake request cb at [phone].

## 2014-12-31 NOTE — Telephone Encounter (Signed)
Faxed prior auth

## 2015-01-03 NOTE — Telephone Encounter (Signed)
Patient spoke to [place] at [facility] this morning.  Caryl Pina said they didn't receive the fax for the P.A.. Ashley's phone number is [phone].  It sounds like Caryl Pina is at a different location than Seth Bake.  Patient said Caryl Pina will be faxing over another P.A. Form.  I gave patient Andrea's number and asked her to call and see if Seth Bake received the P.A. Form.

## 2015-01-03 NOTE — Telephone Encounter (Signed)
Faxed PA. Called patient and notified her that the form has been sent.

## 2015-01-03 NOTE — Telephone Encounter (Signed)
Patient called the number for Seth Bake and spoke to someone else and they said they hadn't received the form and the form you receive today must be faxed to [phone].  Patient won't receive her medication until they get the form.  Please call patient at [phone] when form is faxed.

## 2015-01-04 NOTE — Telephone Encounter (Signed)
Renee with [facility] left v/m requesting cb at [phone] ; there are 2 questions that need an answer for prior auth.

## 2015-01-04 NOTE — Telephone Encounter (Signed)
Left voicemail for Gabrielle Webb with [facility] for a call back

## 2015-01-05 NOTE — Telephone Encounter (Signed)
Left voicemail for Renee with [facility] for a call back.

## 2015-01-06 NOTE — Telephone Encounter (Signed)
Left voicemail again for Gabrielle Webb with [facility] for a call back.

## 2015-01-07 NOTE — Telephone Encounter (Signed)
received faxed from [facility]. dicyclomine 10 mg has been approved and is in processing.

## 2015-01-11 ENCOUNTER — Telehealth: Payer: Self-pay | Admitting: *Deleted

## 2015-01-11 ENCOUNTER — Encounter: Payer: Self-pay | Admitting: Podiatry

## 2015-01-11 ENCOUNTER — Ambulatory Visit (INDEPENDENT_AMBULATORY_CARE_PROVIDER_SITE_OTHER): Payer: PPO | Admitting: Podiatry

## 2015-01-11 VITALS — BP 101/56 | HR 78 | Resp 18

## 2015-01-11 DIAGNOSIS — M722 Plantar fascial fibromatosis: Secondary | ICD-10-CM

## 2015-01-11 NOTE — Telephone Encounter (Signed)
Prior auth for Dicyclomine 10mg  capsule approved from 01/07/2015 until 12/03/2015 from [employer]

## 2015-01-12 NOTE — Progress Notes (Signed)
Patient ID: Gabrielle Webb, female   DOB: 01-02-1941, 74 y.o.   MRN: [id number]  Subjective: 74 year old female returns the office a pop evaluation of right heel pain. She states that since last appointment she is doing better and her pain has decreased although she can still feel at times. She states that after the taping was applied last appointment was the best that her foot it field and a long time. She has been continuing the over-the-counter inserts although they are more gel and soft in consistency. She states that she try a pair inserts from her neighbor ever custom-made and she said her feet felt great after wearing them. She also states that she has been continuing the night splint, which helps as well. She's been continuing the stretching icing exercises much as possible. Denies any swelling or redness overlying the area. other complaints at this time in no acute changes since last appointment.  Objective: AAO x3, NAD DP/PT pulses palpable bilaterally, CRT less than 3 seconds Protective sensation intact with Simms Weinstein monofilament, vibratory sensation intact, Achilles tendon reflex intact Mild tenderness to palpation overlying the plantar medial tubercle of the calcaneus to right heel at the insertion of the plantar fascia, although it does appear to be decreased compared to last appointment. There is no pain along the course of plantar fascial within the arch of the foot. There is no pain with lateral compression of the calcaneus or pain the vibratory sensation. No pain on the posterior aspect of the calcaneus or along the course/insertion of the Achilles tendon. There is no overlying edema, erythema, increase in warmth bilatearlly. No other areas of tenderness palpation or pain with vibratory sensation to the foot/ankle bilatearlly. MMT 5/5, ROM WNL No open lesions or pre-ulcerative lesions are identified. No pain with calf compression, swelling, warmth,  erythema.  Assessment: 74 year old female with right heel pain, likely plantar fasciitis, resolving  Plan: -Treatment options were discussed the patient including alternatives, risks, complications. -At this time as her pain seems to be decreased we'll hold off on a steroid injection at this time. Also, it seems that her pain is relieved by wearing a supportive shoe/insert.  -I discussed with her and great length various orthotic options. I discussed with her custom orthotics that she was inquiring about them. However, her insurance is not covering them and she cannot afford and at this time. I discussed the various over-the-counter options. I also discussed with her shoegear modifications.  -Plantar fascial taping was applied -Continue stretching/strengthing exercises. -Follow-up in 4 weeks, or sooner should any problems arise. In the meantime, encouraged to call the office with any questions/concerns/change in symptoms.

## 2015-02-01 ENCOUNTER — Ambulatory Visit (INDEPENDENT_AMBULATORY_CARE_PROVIDER_SITE_OTHER): Payer: PPO | Admitting: Podiatry

## 2015-02-01 ENCOUNTER — Ambulatory Visit (INDEPENDENT_AMBULATORY_CARE_PROVIDER_SITE_OTHER): Payer: PPO

## 2015-02-01 VITALS — BP 105/64 | HR 76 | Resp 16

## 2015-02-01 DIAGNOSIS — M722 Plantar fascial fibromatosis: Secondary | ICD-10-CM

## 2015-02-02 NOTE — Progress Notes (Signed)
Patient ID: Gabrielle Webb, female   DOB: 02-Aug-1941, 74 y.o.   MRN: [id number]  Subjective: 74 year old female returns the office today for follow-up evaluation of right heel pain. She states that since last point she started having recurrence of the heel pain. She has a states that she was wearing the night splint and she believes that she twisted her foot in the boot while sleeping and since that she's had some pain on the lateral aspect of the ankle and leg. She denies any swelling or redness overlying the area. She also states that she has sciatica and she asked if this could be related. She states the pain in her foot does not wake her up at night and she does not have a tingling or  numbness in the foot at this time. No other complaints at this time. Denies any systemic complaints as fevers, chills, nausea, vomiting.  Objective: AAO 3, NAD DP/PT pulses palpable, CRT less than 3 seconds; normal warmth to foot. Protective sensation intact with Simms once the monofilament, vibratory sensation intact, Achilles tendon reflex intact. There is tenderness palpation upon the plantar medial tubercle of the calcaneus on the right foot at the insertion of the plantar fascia. There is no pain along the course of plantar fascial within the arch of the foot and the ligament appears to be intact. No pain with lateral compression of the calcaneus or pain with vibratory sensation. No pain on the posterior aspect the calcaneus or along the course/insertion of the Achilles tendon. There is mild discomfort along the course of the peroneal tendons posterior and inferior to the lateral malleolus. There is no pain with eversion. The peroneal tendons appear to be intact. There is no overlying edema, erythema, increase in warmth. MMT 5/5, ROM WNL No open lesions or pre-ulcerative lesions identified bilaterally. No pain with calf compression, swelling, warmth, erythema.  Assessment: 74 year old female with recurrent  right heel pain, likely plantar fasciitis; likely peroneal tendinitis  Plan: -X-rays were obtained and reviewed with the patient. -Treatment options were discussed with the patient including all alternatives, risks, complications. -At this time due to increased pain recommended injection into the area. She states that she has an allergy to oral prednisone which resulted in a fast heartbeat. She would like to proceed with steroid injection at this time. I discussed with her the risks of the injection she given allergy. She states the allergy did not result in going to the emergency room or any significant complications. Under sterile conditions a total of 2 mL of a mixture of Celestone Soluspan and 0.5% Marcaine plain was infiltrated into the area of maximal tenderness along the plantar fascia. Band-Aid was applied. Postinjection care was discussed the patient. Plantar fascial taping was applied. -Recommended patient to hold off on the night splint. -Continue stretching and icing activities. -Follow-up in 2 weeks. At that time if symptoms are not resolved I discussed with her possible immobilization in a [place] or PT. In the meantime, encouraged to call the office with any questions/concerns/change in symptoms.

## 2015-02-08 ENCOUNTER — Ambulatory Visit: Payer: PPO | Admitting: Podiatry

## 2015-02-15 ENCOUNTER — Ambulatory Visit (INDEPENDENT_AMBULATORY_CARE_PROVIDER_SITE_OTHER): Payer: PPO | Admitting: Podiatry

## 2015-02-15 VITALS — BP 112/58 | HR 66 | Resp 18

## 2015-02-15 DIAGNOSIS — M722 Plantar fascial fibromatosis: Secondary | ICD-10-CM | POA: Diagnosis not present

## 2015-02-15 NOTE — Progress Notes (Signed)
Patient ID: Gabrielle Webb, female   DOB: 07/13/1941, 74 y.o.   MRN: [id number]  Subjective: Gabrielle Webb returns the office today for follow-up evaluation of right heel pain. She does have a since last appointment she is doing much better to the right heel. She does continue to have some intermittent discomfort overlying the area but overall is improved. She also states the pain in her legs has much improved. She denies any swelling or redness overlying the area. Denies any numbness or tingling. The pain does not wake her up at night. No recent injury or trauma. No other complaints at this time. Denies any systemic complaints as fevers, chills, nausea, vomiting.  Objective: AAO 3, NAD DP/PT pulses palpable, CRT less than 3 seconds; normal warmth to foot. Protective sensation intact with Derrel Nip monofilament, vibratory sensation intact, Achilles tendon reflex intact. There is mild  tenderness palpation upon the plantar medial tubercle of the calcaneus on the right foot at the insertion of the plantar fascia, although much improved. There is no pain along the course of plantar fascial within the arch of the foot and the ligament appears to be intact. No pain with lateral compression of the calcaneus or pain with vibratory sensation bilaterally. No pain on the posterior aspect the calcaneus or along the course/insertion of the Achilles tendon. There is no discomfort along the course of the peroneal tendons at this time. No pain along the medial ankle/flexor tendons.There is no overlying edema, erythema, increase in warmth. MMT 5/5, ROM WNL No open lesions or pre-ulcerative lesions identified bilaterally. No pain with calf compression, swelling, warmth, erythema.  Assessment: 74 year old female with resolving right heel pain, likely plantar fasciitis  Plan: -Treatment options were discussed with the patient including all alternatives, risks, complications. -At this time due to increased pain  recommended injection into the area. She states that she has an allergy to oral prednisone which resulted in a fast heartbeat. She would like to proceed with steroid injection at this time. I discussed with her the risks of the injection she given allergy. She states the allergy did not result in going to the emergency room or any significant complications. Under sterile conditions a total of 2.5 mL of a mixture of Dexamethasone phospahte, 2% lidocaine plain and 0.5% Marcaine plain was infiltrated into the area of maximal tenderness along the plantar fascia. Band-Aid was applied. Postinjection care was discussed the patient. Plantar fascial taping was applied.  -Continue stretching and icing activities. -Continue shoe gear modifications. Recommended not to go barefoot at all while even in the house to wear supportive shoe. -Follow-up in 3 weeks or sooner if any palms are to arise. In the meantime occurs to call the office with any questions, concerns, change in symptoms.

## 2015-03-08 ENCOUNTER — Ambulatory Visit: Payer: PPO | Admitting: Podiatry

## 2015-03-18 ENCOUNTER — Other Ambulatory Visit: Payer: Self-pay | Admitting: *Deleted

## 2015-03-18 DIAGNOSIS — C50911 Malignant neoplasm of unspecified site of right female breast: Secondary | ICD-10-CM

## 2015-03-20 NOTE — Progress Notes (Signed)
[facility]  Telephone:([phone]) [phone] Fax:(336) [phone]     ID: [name] DOB: 1941-10-02  MR#: [id number]  QIO#:[id number]  Patient Care Team: Abner Greenspan, MD as PCP - General Abner Greenspan, MD Thea Silversmith, MD as Consulting Physician (Radiation Oncology) Neldon Mc, MD as Surgeon (General Surgery) Thea Silversmith, MD as Consulting Physician (Radiation Oncology) Chauncey Cruel, MD as Consulting Physician (Oncology) Dian Queen, MD as Consulting Physician (Obstetrics and Gynecology) PCP: Loura Pardon, MD OTHER MD:  CHIEF COMPLAINT: Estrogen receptor positive breast cancer  CURRENT TREATMENT: Letrozole   BREAST CANCER HISTORY: From Dr. Eusebio Me initial intake note 05/10/2011:  "[name] is a pleasant 74 year old female.  She underwent a screening mammogram on 04/20/2011 and was found to have an asymmetric spiculated mass in the upper outer quadrant.  Ultrasound confirmed the presence of this mass, and a biopsy on 05/02/2011 revealed a low-grade invasive ductal carcinoma.  This measured 11 mm and was ER, PR positive, HER2 negative and Ki-67 was 15%.  An MRI of the bilateral breasts was performed on 05/07/2011.  This showed a 1.5 x 1.0 cm mass.  Ms. Rinks notes a single episode of possible stinging in her right breast prior to her mammogram.  She has a long history of fibrocystic disease though, so she did not feel anything was greatly out of the ordinary.  Interestingly, she did have a history of several breast biopsies and excisions in 1987 and 1988.  She brings these old clinic records with her, and it looks like some fibrocystic disease was removed as well as an area of atypical cells.  The differential is quite broad, but it is possible given that she has developed this in a very similar area that this was LCIS.  Regardless, at that time when she had those biopsies, she had been on hormone replacement therapy for about a month and took  herself off."  Her subsequent history is as detailed below  INTERVAL HISTORY: Jim returns today for follow-up of her breast cancer. She is establishing herself in my service.--She was on tamoxifen beginning August 2012. On 05/11/2014 however she had a Pap smear under Dr. Glori Bickers which showed no intraepithelial lesions or malignancy. There were endometrial cells present, however (CZA [phone]). Accordingly the patient was referred to Dr. Helane Rima and underwent dilatation and curettage 05/31/2014. This showed (SZD [id number]) no endometrial tissue. There were detached fragments of squamous epithelium. There was no intraepithelial lesions or malignancy.  The patient then saw Dr. Harden Mo at [facility]. She suggested the patient switched to letrozole, which was done August 2015. Because the patient already had osteoporosis, she also started the patient on alendronate. Ivyonna is here to follow-up on those recommendations  REVIEW OF SYSTEMS: She is tolerating the letrozole with no significant hot flashes, and no vaginal dryness problems. She has not experienced the arthralgias or myalgias that many patients like her Experience on that drug. She is also tolerating the alendronate well, with no reflux problems. She understands the instructions on how to take that drug. She recently developed plantar fasciitis and since she couldn't walk for exercise she started doing stretching. She was a little bit too vigorous and has pulled something in her right axilla that she wanted me to look at. 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 A detailed review of systems today was otherwise entirely negative.  PAST MEDICAL HISTORY: Past Medical History  Diagnosis  Date  . Allergy   . Anxiety   . GERD (gastroesophageal reflux disease)   . Osteoporosis   . Diverticulosis of colon   . Vertigo   . Hypothyroid   . Breast cancer 7/12    Right  . Skin cancer      basal and squamous cell  . Cellulitis   . Hx of radiation therapy 06/20/11 - 07/11/11    right breast  . PONV (postoperative nausea and vomiting)     PAST SURGICAL HISTORY: Past Surgical History  Procedure Laterality Date  . Breast biopsy    . Knee surgery  1962  . Appendectomy  07/2007  . Breast lumpectomy Right 05/16/2011  . Dilation and curettage of uterus N/A 05/31/2014    Procedure: DILATATION AND CURETTAGE WITH ULTRASOUND GUIDANCE;  Surgeon: Cyril Mourning, MD;  Location: [facility];  Service: Gynecology;  Laterality: N/A;    FAMILY HISTORY Family History  Problem Relation Age of Onset  . Heart attack Father   . Hypertension Father   . Atrial fibrillation Mother   . Coronary artery disease Mother   . Diabetes      Grandmother  . Coronary artery disease      Grandmother  . Uterine cancer      Grandmother  . Leukemia      Aunt  . Cancer Maternal Aunt     leukemia  . Cancer Maternal Uncle     colon   the patient's father died at the age of 48 from a myocardial infarction. The patient's mother is 35 years old currently. She has atrial fibrillation. She lives at [facility] [place]. The patient is an only child. There is no history of breast or ovarian cancer in the family to her knowledge  GYNECOLOGIC HISTORY:  Patient's last menstrual period was 12/03/1988. Menarche age 89 first live birth age 45. She stopped having periods in her late 85s. 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 Her husband died from widely metastatic cancer of unknown primary (it was never biopsied) approximately a year ago. She lives alone in a [place], with 2 cats. Her son Gabrielle Webb "Gabrielle Webb "Careers adviser lives in [place]. He works out of his home for [employer]. The patient has 3 grandchildren, the oldest 74, 35, his micro-cephalic, the others are 87 and 72 years old. The patient attends a local church in  [place]: In place. The patient's son Gabrielle Webb "Gabrielle Webb " Gabrielle Webb Gabrielle Webb is her healthcare part of attorney. He can be reached at [phone].   HEALTH MAINTENANCE: History  Substance Use Topics  . Smoking status: Never Smoker   . Smokeless tobacco: Never Used  . Alcohol Use: No     Colonoscopy:  PAP:  Bone density:  Lipid panel:  Allergies  Allergen Reactions  . Codeine Nausea And Vomiting  . Morphine Nausea And Vomiting  . Prednisone Palpitations    Rapid hear beat    Current Outpatient Prescriptions  Medication Sig Dispense Refill  . ALPRAZolam (XANAX) 0.25 MG tablet TAKE ONE TABLET EVERY DAY AS NEEDED FOR ANXIETY/SLEEP.    Marland Kitchen aspirin 81 MG tablet Take 81 mg by mouth daily.     . chlorthalidone (HYGROTON) 50 MG tablet Take 1 tablet (50 mg total) by mouth daily as needed. For swelling. 90 tablet 1  . Cholecalciferol (VITAMIN D) 2000 UNITS CAPS Take 1 capsule by mouth daily.    Marland Kitchen  diclofenac sodium (VOLTAREN) 1 % GEL Apply 2 g topically 4 (four) times daily. Rub into affected area of foot 2 to 4 times daily 100 g 2  . dicyclomine (BENTYL) 10 MG capsule Take 1 capsule (10 mg total) by mouth 2 (two) times daily as needed. 180 capsule 1  . letrozole (FEMARA) 2.5 MG tablet Take 2.5 mg by mouth daily.    Marland Kitchen levothyroxine (SYNTHROID, LEVOTHROID) 112 MCG tablet Take 1 tablet (112 mcg total) by mouth daily. 90 tablet 1  . Multiple Vitamin (MULTIVITAMIN) capsule Take 1 capsule by mouth daily.      . niacin 500 MG tablet Take 1,000 mg by mouth at bedtime.     . ranitidine (ZANTAC) 150 MG tablet Take 150 mg by mouth as needed.       No current facility-administered medications for this visit.    OBJECTIVE: Middle-aged white woman who appears stated age 34 Vitals:   03/21/15 1357  BP: 112/62  Pulse: 82  Temp: 98 F (36.7 C)  Resp: 18     Body mass index is 23.98 kg/(m^2).    ECOG FS:1 - Symptomatic but completely ambulatory  Ocular: Sclerae unicteric, pupils  equal, round and reactive to light Ear-nose-throat: Oropharynx clear, dentition in good repair Lymphatic: No cervical or supraclavicular adenopathy Lungs no rales or rhonchi, good excursion bilaterally Heart regular rate and rhythm, no murmur appreciated Abd soft, nontender, positive bowel sounds MSK no focal spinal tenderness, no joint edema Neuro: non-focal, well-oriented, positive affect Breasts: The right breast is status post lumpectomy and radiation. There is no evidence of local recurrence. The right axilla shows no abnormality to inspection or palpation; the left breast is unremarkable   LAB RESULTS:  CMP     Component Value Date/Time   NA 140 03/21/2015 1335   NA 141 05/27/2014 1120   K 3.8 03/21/2015 1335   K 3.6* 05/27/2014 1120   CL 105 05/27/2014 1120   CL 109* 09/18/2012 0849   CO2 26 03/21/2015 1335   CO2 27 05/27/2014 1120   GLUCOSE 92 03/21/2015 1335   GLUCOSE 92 05/27/2014 1120   GLUCOSE 79 09/18/2012 0849   GLUCOSE 95 12/16/2006 1023   BUN 14.6 03/21/2015 1335   BUN 16 05/27/2014 1120   CREATININE 0.8 03/21/2015 1335   CREATININE 0.74 05/27/2014 1120   CALCIUM 9.8 03/21/2015 1335   CALCIUM 9.3 05/27/2014 1120   PROT 7.0 03/21/2015 1335   PROT 6.6 05/04/2014 0850   ALBUMIN 4.1 03/21/2015 1335   ALBUMIN 4.0 05/04/2014 0850   AST 22 03/21/2015 1335   AST 21 05/04/2014 0850   ALT 13 03/21/2015 1335   ALT 12 05/04/2014 0850   ALKPHOS 58 03/21/2015 1335   ALKPHOS 32* 05/04/2014 0850   BILITOT 0.46 03/21/2015 1335   BILITOT 0.7 05/04/2014 0850   GFRNONAA 84* 05/27/2014 1120   GFRAA >90 05/27/2014 1120    INo results found for: SPEP, UPEP  Lab Results  Component Value Date   WBC 7.4 03/21/2015   NEUTROABS 4.3 03/21/2015   HGB 13.7 03/21/2015   HCT 40.9 03/21/2015   MCV 89.5 03/21/2015   PLT 184 03/21/2015      Chemistry      Component Value Date/Time   NA 140 03/21/2015 1335   NA 141 05/27/2014 1120   K 3.8 03/21/2015 1335   K 3.6*  05/27/2014 1120   CL 105 05/27/2014 1120   CL 109* 09/18/2012 0849   CO2 26 03/21/2015 1335  CO2 27 05/27/2014 1120   BUN 14.6 03/21/2015 1335   BUN 16 05/27/2014 1120   CREATININE 0.8 03/21/2015 1335   CREATININE 0.74 05/27/2014 1120      Component Value Date/Time   CALCIUM 9.8 03/21/2015 1335   CALCIUM 9.3 05/27/2014 1120   ALKPHOS 58 03/21/2015 1335   ALKPHOS 32* 05/04/2014 0850   AST 22 03/21/2015 1335   AST 21 05/04/2014 0850   ALT 13 03/21/2015 1335   ALT 12 05/04/2014 0850   BILITOT 0.46 03/21/2015 1335   BILITOT 0.7 05/04/2014 0850       Lab Results  Component Value Date   LABCA2 17 05/09/2011    No components found for: [id number]  No results for input(s): INR in the last 168 hours.  Urinalysis    Component Value Date/Time   BILIRUBINUR 1+ 06/25/2014 1208   PROTEINUR 30+ 06/25/2014 1208   UROBILINOGEN 0.2 06/25/2014 1208   NITRITE neg. 06/25/2014 1208   LEUKOCYTESUR moderate (2+) 06/25/2014 1208    STUDIES: No results found.  ASSESSMENT: 74 y.o.  [place] woman status post right breast upper outer quadrant biopsy 05/02/2011 for a low-grade invasive ductal carcinoma which was estrogen receptor 100% positive, progesterone receptor 22% positive, with an MIB-1 of 17% and no HER-2 amplification, the signals ratio being 1.68  ([id number])  2. Status post right lumpectomy and sentinel lymph node sampling 05/16/2011 for a pT1c pN0, stage IA invasive ductal carcinoma, low-grade, with negative margins  3. Oncotype DX score of 13 predicted an 8% risk of recurrence outside the breast within 10 years if the patient's only systemic therapy is tamoxifen for 5 years. He also predicted no benefit from chemotherapy.  3. Adjuvant radiation completed 07/23/2011  4. Tamoxifen started August 2012, discontinued June 2015  5. Letrozole started August 2015  (a) alendronate started August 2015  PLAN: I spent approximately 50 minutes with Fartun going over her  diagnosis, treatment history, and prognosis. She understands that according to the original Oncotype score, she would have a 92% chance of this cancer not coming back outside the breast within 10 years if all she did was take tamoxifen for 5 years. If instead she switched to letrozole, as in fact she has done, there is drops another 2-3%  In other words her risk of recurrence will be very low, in the 5% range, and her chance of this cancer not coming back is can it be in the 95% range.  We then reviewed the possible side effects, toxicities and complications of letrozole. The chief concern is osteoporosis, which you're ready hats. She is taking alendronate and so far tolerating that well. We're going to repeat a bone density scan December of this year. I am hopefully will be at least stable. Otherwise we can always consider switching to zolendronate  The overall plan, then, is to continue letrozole through August 2017, at which time she will have the option" to graduate". In the meantime I have encouraged her to enroll in the "live strong" program at the [facility] has a good understanding of the overall plan. She agrees with it. She knows the goal of treatment in her case is cure. She will call with any problems that may develop before her next visit here.  Chauncey Cruel, MD   03/21/2015 2:40 PM Medical Oncology and Hematology [facility] [address] [place], [place] [address] Tel. [phone]    Fax. [phone]

## 2015-03-21 ENCOUNTER — Other Ambulatory Visit (HOSPITAL_BASED_OUTPATIENT_CLINIC_OR_DEPARTMENT_OTHER): Payer: PPO

## 2015-03-21 ENCOUNTER — Ambulatory Visit (HOSPITAL_BASED_OUTPATIENT_CLINIC_OR_DEPARTMENT_OTHER): Payer: PPO | Admitting: Oncology

## 2015-03-21 ENCOUNTER — Telehealth: Payer: Self-pay | Admitting: Oncology

## 2015-03-21 ENCOUNTER — Ambulatory Visit: Payer: Commercial Managed Care - HMO | Admitting: Oncology

## 2015-03-21 VITALS — BP 112/62 | HR 82 | Temp 98.0°F | Resp 18 | Ht 64.0 in | Wt 139.8 lb

## 2015-03-21 DIAGNOSIS — C50911 Malignant neoplasm of unspecified site of right female breast: Secondary | ICD-10-CM

## 2015-03-21 DIAGNOSIS — Z170 Estrogen receptor positive status [ER+]: Secondary | ICD-10-CM

## 2015-03-21 DIAGNOSIS — C50411 Malignant neoplasm of upper-outer quadrant of right female breast: Secondary | ICD-10-CM

## 2015-03-21 DIAGNOSIS — M810 Age-related osteoporosis without current pathological fracture: Secondary | ICD-10-CM

## 2015-03-21 LAB — CBC WITH DIFFERENTIAL/PLATELET
BASO%: 0.4 % (ref 0.0–2.0)
BASOS ABS: 0 10*3/uL (ref 0.0–0.1)
EOS%: 1.2 % (ref 0.0–7.0)
Eosinophils Absolute: 0.1 10*3/uL (ref 0.0–0.5)
HEMATOCRIT: 40.9 % (ref 34.8–46.6)
HEMOGLOBIN: 13.7 g/dL (ref 11.6–15.9)
LYMPH#: 2.4 10*3/uL (ref 0.9–3.3)
LYMPH%: 32 % (ref 14.0–49.7)
MCH: 30 pg (ref 25.1–34.0)
MCHC: 33.5 g/dL (ref 31.5–36.0)
MCV: 89.5 fL (ref 79.5–101.0)
MONO#: 0.6 10*3/uL (ref 0.1–0.9)
MONO%: 7.8 % (ref 0.0–14.0)
NEUT#: 4.3 10*3/uL (ref 1.5–6.5)
NEUT%: 58.6 % (ref 38.4–76.8)
Platelets: 184 10*3/uL (ref 145–400)
RBC: 4.57 10*6/uL (ref 3.70–5.45)
RDW: 13.9 % (ref 11.2–14.5)
WBC: 7.4 10*3/uL (ref 3.9–10.3)

## 2015-03-21 LAB — COMPREHENSIVE METABOLIC PANEL (CC13)
ALT: 13 U/L (ref 0–55)
AST: 22 U/L (ref 5–34)
Albumin: 4.1 g/dL (ref 3.5–5.0)
Alkaline Phosphatase: 58 U/L (ref 40–150)
Anion Gap: 11 mEq/L (ref 3–11)
BUN: 14.6 mg/dL (ref 7.0–26.0)
CALCIUM: 9.8 mg/dL (ref 8.4–10.4)
CO2: 26 meq/L (ref 22–29)
CREATININE: 0.8 mg/dL (ref 0.6–1.1)
Chloride: 104 mEq/L (ref 98–109)
EGFR: 80 mL/min/{1.73_m2} — AB (ref >90)
Glucose: 92 mg/dl (ref 70–140)
Potassium: 3.8 mEq/L (ref 3.5–5.1)
Sodium: 140 mEq/L (ref 136–145)
Total Bilirubin: 0.46 mg/dL (ref 0.20–1.20)
Total Protein: 7 g/dL (ref 6.4–8.3)

## 2015-03-21 NOTE — Telephone Encounter (Signed)
per pof to sch pt appt-sch DEXA-gave pt copy of sch °

## 2015-04-25 ENCOUNTER — Other Ambulatory Visit: Payer: Self-pay | Admitting: Oncology

## 2015-04-25 DIAGNOSIS — Z9889 Other specified postprocedural states: Secondary | ICD-10-CM

## 2015-05-05 ENCOUNTER — Telehealth: Payer: Self-pay | Admitting: Family Medicine

## 2015-05-05 DIAGNOSIS — E039 Hypothyroidism, unspecified: Secondary | ICD-10-CM

## 2015-05-05 DIAGNOSIS — E7800 Pure hypercholesterolemia, unspecified: Secondary | ICD-10-CM

## 2015-05-05 DIAGNOSIS — Z0000 Encounter for general adult medical examination without abnormal findings: Secondary | ICD-10-CM

## 2015-05-05 DIAGNOSIS — M810 Age-related osteoporosis without current pathological fracture: Secondary | ICD-10-CM

## 2015-05-05 NOTE — Telephone Encounter (Signed)
-----   Message from Ellamae Sia sent at 05/04/2015 11:41 AM EDT ----- Regarding: Lab orders for Friday, 6.3.16 Patient is scheduled for CPX labs, please order future labs, Thanks , Karna Christmas

## 2015-05-06 ENCOUNTER — Other Ambulatory Visit (INDEPENDENT_AMBULATORY_CARE_PROVIDER_SITE_OTHER): Payer: PPO

## 2015-05-06 DIAGNOSIS — E7800 Pure hypercholesterolemia, unspecified: Secondary | ICD-10-CM

## 2015-05-06 DIAGNOSIS — E039 Hypothyroidism, unspecified: Secondary | ICD-10-CM | POA: Diagnosis not present

## 2015-05-06 LAB — LIPID PANEL
Cholesterol: 174 mg/dL (ref 0–200)
HDL: 53.3 mg/dL (ref >39.00)
LDL Cholesterol: 99 mg/dL (ref 0–99)
NonHDL: 120.7
TRIGLYCERIDES: 107 mg/dL (ref 0.0–149.0)
Total CHOL/HDL Ratio: 3
VLDL: 21.4 mg/dL (ref 0.0–40.0)

## 2015-05-06 LAB — COMPREHENSIVE METABOLIC PANEL
ALT: 10 U/L (ref 0–35)
AST: 17 U/L (ref 0–37)
Albumin: 3.9 g/dL (ref 3.5–5.2)
Alkaline Phosphatase: 49 U/L (ref 39–117)
BUN: 15 mg/dL (ref 6–23)
CALCIUM: 9.1 mg/dL (ref 8.4–10.5)
CO2: 29 mEq/L (ref 19–32)
CREATININE: 0.77 mg/dL (ref 0.40–1.20)
Chloride: 106 mEq/L (ref 96–112)
GFR: 78.33 mL/min (ref >60.00)
Glucose, Bld: 87 mg/dL (ref 70–99)
Potassium: 3.6 mEq/L (ref 3.5–5.1)
SODIUM: 140 meq/L (ref 135–145)
TOTAL PROTEIN: 6.4 g/dL (ref 6.0–8.3)
Total Bilirubin: 0.6 mg/dL (ref 0.2–1.2)

## 2015-05-06 LAB — TSH: TSH: 0.58 u[IU]/mL (ref 0.35–4.50)

## 2015-05-13 ENCOUNTER — Encounter: Payer: Self-pay | Admitting: Family Medicine

## 2015-05-13 ENCOUNTER — Other Ambulatory Visit (HOSPITAL_COMMUNITY)
Admission: RE | Admit: 2015-05-13 | Discharge: 2015-05-13 | Disposition: A | Discharge status: Home / Self Care | Payer: PPO | Source: Ambulatory Visit | Attending: Family Medicine | Admitting: Family Medicine

## 2015-05-13 ENCOUNTER — Ambulatory Visit (INDEPENDENT_AMBULATORY_CARE_PROVIDER_SITE_OTHER): Payer: PPO | Admitting: Family Medicine

## 2015-05-13 VITALS — BP 114/68 | HR 69 | Temp 98.1°F | Ht 63.5 in | Wt 139.5 lb

## 2015-05-13 DIAGNOSIS — Z1211 Encounter for screening for malignant neoplasm of colon: Secondary | ICD-10-CM | POA: Diagnosis not present

## 2015-05-13 DIAGNOSIS — Z23 Encounter for immunization: Secondary | ICD-10-CM

## 2015-05-13 DIAGNOSIS — E039 Hypothyroidism, unspecified: Secondary | ICD-10-CM | POA: Diagnosis not present

## 2015-05-13 DIAGNOSIS — E7800 Pure hypercholesterolemia, unspecified: Secondary | ICD-10-CM

## 2015-05-13 DIAGNOSIS — Z124 Encounter for screening for malignant neoplasm of cervix: Secondary | ICD-10-CM | POA: Insufficient documentation

## 2015-05-13 DIAGNOSIS — R87618 Other abnormal cytological findings on specimens from cervix uteri: Secondary | ICD-10-CM

## 2015-05-13 DIAGNOSIS — Z0000 Encounter for general adult medical examination without abnormal findings: Secondary | ICD-10-CM | POA: Diagnosis not present

## 2015-05-13 DIAGNOSIS — M810 Age-related osteoporosis without current pathological fracture: Secondary | ICD-10-CM | POA: Diagnosis not present

## 2015-05-13 NOTE — Progress Notes (Signed)
Pre visit review using our clinic review tool, if applicable. No additional management support is needed unless otherwise documented below in the visit note. 

## 2015-05-13 NOTE — Progress Notes (Signed)
Subjective:    Patient ID: Gabrielle Webb, female    DOB: 1941/03/21, 74 y.o.   MRN: [id number]  HPI Here for annual medicare wellness visit as well as chronic/acute medical problems   I have personally reviewed the Medicare Annual Wellness questionnaire and have noted 1. The patient's medical and social history 2. Their use of alcohol, tobacco or illicit drugs 3. Their current medications and supplements 4. The patient's functional ability including ADL's, fall risks, home safety risks and hearing or visual             impairment. 5. Diet and physical activities 6. Evidence for depression or mood disorders  The patients weight, height, BMI have been recorded in the chart and visual acuity is per eye clinic.  I have made referrals, counseling and provided education to the patient based review of the above and I have provided the pt with a written personalized care plan for preventive services. Reviewed and updated provider list, see scanned forms.  Doing well overall / feeling good   See scanned forms.  Routine anticipatory guidance given to patient.  See health maintenance. Colon cancer screening 8/14 colonoscopy -had 2 polyps - 5 year recall  Breast cancer screening mammogram 6/15, hx of breast cancer- she is scheduling that - f/u with Dr Jana Hakim Self breast exam - no changes (had exam with oncol in April -goes back in Oct) Flu vaccine 11/15  Tetanus vaccine 1/07 Pneumovax 23 , a year ago , will get prevnar today Gyn - had endo bx and it was fine (is on femara now) -needs f/u pap  Zoster vaccine 12/08 Has 2 y dexa set up for end of Dec - OP/ on ca and D (was on Boniva for 5 y), no falls , no fractures  Advance directive-has a living will and POA  Cognitive function addressed- see scanned forms- and if abnormal then additional documentation follows.  No concerns about memory   PMH and SH reviewed  Meds, vitals, and allergies reviewed.   ROS: See HPI.  Otherwise  negative.    Hypothyroidism  Pt has no clinical changes No change in energy level/ hair or skin/ edema and no tremor Lab Results  Component Value Date   TSH 0.58 05/06/2015     Hyperlipidemia Lab Results  Component Value Date   CHOL 174 05/06/2015   CHOL 185 05/04/2014   CHOL 163 04/28/2013   Lab Results  Component Value Date   HDL 53.30 05/06/2015   HDL 57.20 05/04/2014   HDL 58.50 04/28/2013   Lab Results  Component Value Date   LDLCALC 99 05/06/2015   LDLCALC 105* 05/04/2014   LDLCALC 89 04/28/2013   Lab Results  Component Value Date   TRIG 107.0 05/06/2015   TRIG 115.0 05/04/2014   TRIG 76.0 04/28/2013   Lab Results  Component Value Date   CHOLHDL 3 05/06/2015   CHOLHDL 3 05/04/2014   CHOLHDL 3 04/28/2013   Lab Results  Component Value Date   LDLDIRECT 142.0 03/19/2011   LDLDIRECT 125.4 12/08/2009   LDLDIRECT 172.0 09/19/2009   very well controlled -overall stable  LDL is down a little  Less walking due to plantar fasciitis   Getting better     Chemistry      Component Value Date/Time   NA 140 05/06/2015 0831   NA 140 03/21/2015 1335   K 3.6 05/06/2015 0831   K 3.8 03/21/2015 1335   CL 106 05/06/2015 0831   CL 109*  09/18/2012 0849   CO2 29 05/06/2015 0831   CO2 26 03/21/2015 1335   BUN 15 05/06/2015 0831   BUN 14.6 03/21/2015 1335   CREATININE 0.77 05/06/2015 0831   CREATININE 0.8 03/21/2015 1335      Component Value Date/Time   CALCIUM 9.1 05/06/2015 0831   CALCIUM 9.8 03/21/2015 1335   ALKPHOS 49 05/06/2015 0831   ALKPHOS 58 03/21/2015 1335   AST 17 05/06/2015 0831   AST 22 03/21/2015 1335   ALT 10 05/06/2015 0831   ALT 13 03/21/2015 1335   BILITOT 0.6 05/06/2015 0831   BILITOT 0.46 03/21/2015 1335      Lab Results  Component Value Date   WBC 7.4 03/21/2015   HGB 13.7 03/21/2015   HCT 40.9 03/21/2015   MCV 89.5 03/21/2015   PLT 184 03/21/2015      Patient Active Problem List   Diagnosis Date Noted  . Breast cancer,  right breast 03/21/2015  . UTI (urinary tract infection) 06/25/2014  . Abnormal Pap smear of cervix 05/18/2014  . History of tamoxifen therapy 05/18/2014  . Encounter for routine gynecological examination 05/06/2013  . Colon cancer screening 05/06/2013  . Encounter for Medicare annual wellness exam 04/28/2013  . Hx of radiation therapy   . Hematuria 09/12/2012  . Breast cancer   . Gynecological examination 12/25/2011  . Hx of Breast cancer, stage 1, Right, UOQ, Receptor+,Her2- 05/29/2011    Class: Stage 1  . Other screening mammogram 03/21/2011  . Post-menopausal 03/21/2011  . Hypothyroidism 01/23/2008  . Pure hypercholesterolemia 01/23/2008  . ANXIETY 01/23/2008  . ALLERGIC RHINITIS 01/23/2008  . GERD 01/23/2008  . DIVERTICULOSIS, COLON 01/23/2008  . IBS 01/23/2008  . FIBROCYSTIC BREAST DISEASE 01/23/2008  . Osteoporosis 10/17/2007   Past Medical History  Diagnosis Date  . Allergy   . Anxiety   . GERD (gastroesophageal reflux disease)   . Osteoporosis   . Diverticulosis of colon   . Vertigo   . Hypothyroid   . Breast cancer 7/12    Right  . Skin cancer     basal and squamous cell  . Cellulitis   . Hx of radiation therapy 06/20/11 - 07/11/11    right breast  . PONV (postoperative nausea and vomiting)    Past Surgical History  Procedure Laterality Date  . Breast biopsy    . Knee surgery  1962  . Appendectomy  07/2007  . Breast lumpectomy Right 05/16/2011  . Dilation and curettage of uterus N/A 05/31/2014    Procedure: DILATATION AND CURETTAGE WITH ULTRASOUND GUIDANCE;  Surgeon: Cyril Mourning, MD;  Location: [facility];  Service: Gynecology;  Laterality: N/A;   History  Substance Use Topics  . Smoking status: Never Smoker   . Smokeless tobacco: Never Used  . Alcohol Use: No   Family History  Problem Relation Age of Onset  . Heart attack Father   . Hypertension Father   . Atrial fibrillation Mother   . Coronary artery disease Mother   . Diabetes       Grandmother  . Coronary artery disease      Grandmother  . Uterine cancer      Grandmother  . Leukemia      Aunt  . Cancer Maternal Aunt     leukemia  . Cancer Maternal Uncle     colon   Allergies  Allergen Reactions  . Codeine Nausea And Vomiting  . Morphine Nausea And Vomiting  . Prednisone Palpitations    Rapid hear  beat   Current Outpatient Prescriptions on File Prior to Visit  Medication Sig Dispense Refill  . ALPRAZolam (XANAX) 0.25 MG tablet TAKE ONE TABLET EVERY DAY AS NEEDED FOR ANXIETY/SLEEP.    Marland Kitchen aspirin 81 MG tablet Take 81 mg by mouth daily.     . chlorthalidone (HYGROTON) 50 MG tablet Take 1 tablet (50 mg total) by mouth daily as needed. For swelling. 90 tablet 1  . Cholecalciferol (VITAMIN D) 2000 UNITS CAPS Take 1 capsule by mouth daily.    . diclofenac sodium (VOLTAREN) 1 % GEL Apply 2 g topically 4 (four) times daily. Rub into affected area of foot 2 to 4 times daily 100 g 2  . dicyclomine (BENTYL) 10 MG capsule Take 1 capsule (10 mg total) by mouth 2 (two) times daily as needed. 180 capsule 1  . letrozole (FEMARA) 2.5 MG tablet Take 2.5 mg by mouth daily.    Marland Kitchen levothyroxine (SYNTHROID, LEVOTHROID) 112 MCG tablet Take 1 tablet (112 mcg total) by mouth daily. 90 tablet 1  . Multiple Vitamin (MULTIVITAMIN) capsule Take 1 capsule by mouth daily.      . niacin 500 MG tablet Take 1,000 mg by mouth at bedtime.     . ranitidine (ZANTAC) 150 MG tablet Take 150 mg by mouth as needed.       No current facility-administered medications on file prior to visit.     Review of Systems Review of Systems  Constitutional: Negative for fever, appetite change, fatigue and unexpected weight change.  Eyes: Negative for pain and visual disturbance.  Respiratory: Negative for cough and shortness of breath.   Cardiovascular: Negative for cp or palpitations    Gastrointestinal: Negative for nausea, diarrhea and constipation.  Genitourinary: Negative for urgency and frequency.    Skin: Negative for pallor or rash   Neurological: Negative for weakness, light-headedness, numbness and headaches.  Hematological: Negative for adenopathy. Does not bruise/bleed easily.  Psychiatric/Behavioral: Negative for dysphoric mood. The patient is not nervous/anxious.         Objective:   Physical Exam  Constitutional: She appears well-developed and well-nourished. No distress.  Well appearing   HENT:  Head: Normocephalic and atraumatic.  Right Ear: External ear normal.  Left Ear: External ear normal.  Mouth/Throat: Oropharynx is clear and moist.  Eyes: Conjunctivae and EOM are normal. Pupils are equal, round, and reactive to light. No scleral icterus.  Neck: Normal range of motion. Neck supple. No JVD present. Carotid bruit is not present. No thyromegaly present.  Cardiovascular: Normal rate, regular rhythm, normal heart sounds and intact distal pulses.  Exam reveals no gallop.   Pulmonary/Chest: Effort normal and breath sounds normal. No respiratory distress. She has no wheezes. She exhibits no tenderness.  Abdominal: Soft. Bowel sounds are normal. She exhibits no distension, no abdominal bruit and no mass. There is no tenderness.  Genitourinary: Vagina normal and uterus normal. No breast swelling, tenderness, discharge or bleeding. There is no rash, tenderness or lesion on the right labia. There is no rash, tenderness or lesion on the left labia. Uterus is not enlarged and not tender. Cervix exhibits no motion tenderness, no discharge and no friability. Right adnexum displays no mass, no tenderness and no fullness. Left adnexum displays no mass, no tenderness and no fullness. No bleeding in the vagina.  Surgical changes noted  Breast exam: No mass, nodules, thickening, tenderness, bulging, retraction, inflamation, nipple discharge or skin changes noted.  No axillary or clavicular LA.  Vaginal mucosa is atrophic   Musculoskeletal: Normal range of motion. She exhibits no  edema or tenderness.  Lymphadenopathy:    She has no cervical adenopathy.  Neurological: She is alert. She has normal reflexes. No cranial nerve deficit. She exhibits normal muscle tone. Coordination normal.  Skin: Skin is warm and dry. No rash noted. No erythema. No pallor.  Psychiatric: She has a normal mood and affect.          Assessment & Plan:   Problem List Items Addressed This Visit    Abnormal Pap smear of cervix    Last pap had endometrial cells in it and pt was prev tx with tamosiven Her endometrial bx was neg This is 1 y f/u pap  No c/o or symptoms       Relevant Orders   Cytology - PAP   Colon cancer screening    Due 2019 for colonoscopy  IFOB kit given       Relevant Orders   Fecal occult blood, imunochemical   Encounter for Medicare annual wellness exam - Primary    Reviewed health habits including diet and exercise and skin cancer prevention Reviewed appropriate screening tests for age  Also reviewed health mt list, fam hx and immunization status , as well as social and family history    Get your mammogram as planned as well as your bone density test  Do stool card for colon cancer screening  prevnar vaccine today Pap test today  Keep taking good care of yourself       Hypothyroidism    Hypothyroidism  Pt has no clinical changes No change in energy level/ hair or skin/ edema and no tremor Lab Results  Component Value Date   TSH 0.58 05/06/2015          Osteoporosis    S/p 5 y of Boniva Prev on Tamoxifen  dexa planned for Dec  Disc need for calcium/ vitamin D/ wt bearing exercise and bone density test every 2 y to monitor Disc safety/ fracture risk in detail   No falls or fx       Pure hypercholesterolemia    Fairly controlled with diet at this time  Disc goals for lipids and reasons to control them Rev labs with pt Rev low sat fat diet in detail        Other Visit Diagnoses    Need for vaccination with 13-polyvalent pneumococcal  conjugate vaccine        Relevant Orders    Pneumococcal conjugate vaccine 13-valent (Completed)

## 2015-05-13 NOTE — Patient Instructions (Signed)
Get your mammogram as planned as well as your bone density test  Do stool card for colon cancer screening  prevnar vaccine today Pap test today  Keep taking good care of yourself

## 2015-05-15 NOTE — Assessment & Plan Note (Signed)
Reviewed health habits including diet and exercise and skin cancer prevention Reviewed appropriate screening tests for age  Also reviewed health mt list, fam hx and immunization status , as well as social and family history    Get your mammogram as planned as well as your bone density test  Do stool card for colon cancer screening  prevnar vaccine today Pap test today  Keep taking good care of yourself

## 2015-05-15 NOTE — Assessment & Plan Note (Signed)
Due 2019 for colonoscopy  IFOB kit given

## 2015-05-15 NOTE — Assessment & Plan Note (Signed)
Fairly controlled with diet at this time  Disc goals for lipids and reasons to control them Rev labs with pt Rev low sat fat diet in detail

## 2015-05-15 NOTE — Assessment & Plan Note (Signed)
Hypothyroidism  Pt has no clinical changes No change in energy level/ hair or skin/ edema and no tremor Lab Results  Component Value Date   TSH 0.58 05/06/2015

## 2015-05-15 NOTE — Assessment & Plan Note (Signed)
S/p 5 y of Boniva Prev on Tamoxifen  dexa planned for Dec  Disc need for calcium/ vitamin D/ wt bearing exercise and bone density test every 2 y to monitor Disc safety/ fracture risk in detail   No falls or fx

## 2015-05-15 NOTE — Assessment & Plan Note (Signed)
Last pap had endometrial cells in it and pt was prev tx with tamosiven Her endometrial bx was neg This is 1 y f/u pap  No c/o or symptoms

## 2015-05-16 LAB — CYTOLOGY - PAP

## 2015-05-17 ENCOUNTER — Other Ambulatory Visit: Payer: Commercial Managed Care - HMO

## 2015-05-23 ENCOUNTER — Other Ambulatory Visit (INDEPENDENT_AMBULATORY_CARE_PROVIDER_SITE_OTHER): Payer: PPO

## 2015-05-23 DIAGNOSIS — Z1211 Encounter for screening for malignant neoplasm of colon: Secondary | ICD-10-CM

## 2015-05-23 LAB — FECAL OCCULT BLOOD, IMMUNOCHEMICAL: FECAL OCCULT BLD: NEGATIVE

## 2015-05-24 ENCOUNTER — Encounter: Payer: Commercial Managed Care - HMO | Admitting: Family Medicine

## 2015-05-30 ENCOUNTER — Other Ambulatory Visit: Payer: Self-pay

## 2015-05-30 ENCOUNTER — Other Ambulatory Visit: Payer: Self-pay | Admitting: Family Medicine

## 2015-05-30 ENCOUNTER — Other Ambulatory Visit: Payer: Self-pay | Admitting: Oncology

## 2015-05-30 DIAGNOSIS — Z9889 Other specified postprocedural states: Secondary | ICD-10-CM

## 2015-05-31 ENCOUNTER — Ambulatory Visit
Admission: RE | Admit: 2015-05-31 | Discharge: 2015-05-31 | Disposition: A | Discharge status: Home / Self Care | Payer: PPO | Source: Ambulatory Visit | Attending: Oncology | Admitting: Oncology

## 2015-05-31 DIAGNOSIS — Z9889 Other specified postprocedural states: Secondary | ICD-10-CM

## 2015-06-14 ENCOUNTER — Ambulatory Visit (INDEPENDENT_AMBULATORY_CARE_PROVIDER_SITE_OTHER)
Admission: RE | Admit: 2015-06-14 | Discharge: 2015-06-14 | Disposition: A | Discharge status: Home / Self Care | Payer: PPO | Source: Ambulatory Visit | Attending: Family Medicine | Admitting: Family Medicine

## 2015-06-14 ENCOUNTER — Ambulatory Visit (INDEPENDENT_AMBULATORY_CARE_PROVIDER_SITE_OTHER): Payer: PPO | Admitting: Family Medicine

## 2015-06-14 ENCOUNTER — Encounter: Payer: Self-pay | Admitting: Family Medicine

## 2015-06-14 VITALS — BP 126/78 | HR 75 | Temp 98.2°F | Ht 63.5 in | Wt 140.0 lb

## 2015-06-14 DIAGNOSIS — R1084 Generalized abdominal pain: Secondary | ICD-10-CM | POA: Diagnosis not present

## 2015-06-14 DIAGNOSIS — R112 Nausea with vomiting, unspecified: Secondary | ICD-10-CM

## 2015-06-14 DIAGNOSIS — R109 Unspecified abdominal pain: Secondary | ICD-10-CM | POA: Insufficient documentation

## 2015-06-14 LAB — CBC WITH DIFFERENTIAL/PLATELET
BASOS ABS: 0 10*3/uL (ref 0.0–0.1)
BASOS PCT: 0.3 % (ref 0.0–3.0)
Eosinophils Absolute: 0.1 10*3/uL (ref 0.0–0.7)
Eosinophils Relative: 1.3 % (ref 0.0–5.0)
HCT: 39.9 % (ref 36.0–46.0)
HEMOGLOBIN: 13.3 g/dL (ref 12.0–15.0)
Lymphocytes Relative: 37.8 % (ref 12.0–46.0)
Lymphs Abs: 2.3 10*3/uL (ref 0.7–4.0)
MCHC: 33.2 g/dL (ref 30.0–36.0)
MCV: 90.8 fl (ref 78.0–100.0)
Monocytes Absolute: 0.5 10*3/uL (ref 0.1–1.0)
Monocytes Relative: 8.5 % (ref 3.0–12.0)
NEUTROS ABS: 3.2 10*3/uL (ref 1.4–7.7)
Neutrophils Relative %: 52.1 % (ref 43.0–77.0)
PLATELETS: 198 10*3/uL (ref 150.0–400.0)
RBC: 4.39 Mil/uL (ref 3.87–5.11)
RDW: 13.2 % (ref 11.5–15.5)
WBC: 6.1 10*3/uL (ref 4.0–10.5)

## 2015-06-14 LAB — COMPREHENSIVE METABOLIC PANEL
ALBUMIN: 4 g/dL (ref 3.5–5.2)
ALT: 10 U/L (ref 0–35)
AST: 16 U/L (ref 0–37)
Alkaline Phosphatase: 49 U/L (ref 39–117)
BILIRUBIN TOTAL: 0.5 mg/dL (ref 0.2–1.2)
BUN: 10 mg/dL (ref 6–23)
CO2: 28 mEq/L (ref 19–32)
CREATININE: 0.68 mg/dL (ref 0.40–1.20)
Calcium: 9.3 mg/dL (ref 8.4–10.5)
Chloride: 106 mEq/L (ref 96–112)
GFR: 90.38 mL/min (ref >60.00)
GLUCOSE: 87 mg/dL (ref 70–99)
Potassium: 4 mEq/L (ref 3.5–5.1)
Sodium: 140 mEq/L (ref 135–145)
Total Protein: 6.7 g/dL (ref 6.0–8.3)

## 2015-06-14 LAB — LIPASE: LIPASE: 15 U/L (ref 11.0–59.0)

## 2015-06-14 NOTE — Progress Notes (Signed)
Subjective:    Patient ID: Gabrielle Webb, female    DOB: 1940-12-07, 74 y.o.   MRN: [id number]  HPI Here for GI c/o   Started Thursday nt Ate at a neighbor's house  Was constipated  Took 2 dulcolax tabs - within an hour was bloated and painful over all abd  Then started vomiting -no diarrhea  Felt warm- ? If fever  Almost had to go to the hospital   Finally went to sleep at 5 am   Now taking bentyl bid  Felt poorly until yesterday   Yesterday - some LUQ pain when bending over   ? Hx of gallbladder attack in the past  No ultrasound   Has hx of diverticulosis  8/14 last colonoscopy  Hx of IBS   Not eating much  Causes pain-over whole abd (no one area)   Uses zantac when needed -not since getting sick   Bowels moved yesterday and today - not large     Some burping / some heartburn (just after vomiting)   Patient Active Problem List   Diagnosis Date Noted  . Breast cancer, right breast 03/21/2015  . UTI (urinary tract infection) 06/25/2014  . Abnormal Pap smear of cervix 05/18/2014  . History of tamoxifen therapy 05/18/2014  . Encounter for routine gynecological examination 05/06/2013  . Colon cancer screening 05/06/2013  . Encounter for Medicare annual wellness exam 04/28/2013  . Hx of radiation therapy   . Hematuria 09/12/2012  . Breast cancer   . Gynecological examination 12/25/2011  . Hx of Breast cancer, stage 1, Right, UOQ, Receptor+,Her2- 05/29/2011    Class: Stage 1  . Other screening mammogram 03/21/2011  . Post-menopausal 03/21/2011  . Hypothyroidism 01/23/2008  . Pure hypercholesterolemia 01/23/2008  . ANXIETY 01/23/2008  . ALLERGIC RHINITIS 01/23/2008  . GERD 01/23/2008  . DIVERTICULOSIS, COLON 01/23/2008  . IBS 01/23/2008  . FIBROCYSTIC BREAST DISEASE 01/23/2008  . Osteoporosis 10/17/2007   Past Medical History  Diagnosis Date  . Allergy   . Anxiety   . GERD (gastroesophageal reflux disease)   . Osteoporosis   . Diverticulosis of  colon   . Vertigo   . Hypothyroid   . Breast cancer 7/12    Right  . Skin cancer     basal and squamous cell  . Cellulitis   . Hx of radiation therapy 06/20/11 - 07/11/11    right breast  . PONV (postoperative nausea and vomiting)    Past Surgical History  Procedure Laterality Date  . Breast biopsy    . Knee surgery  1962  . Appendectomy  07/2007  . Breast lumpectomy Right 05/16/2011  . Dilation and curettage of uterus N/A 05/31/2014    Procedure: DILATATION AND CURETTAGE WITH ULTRASOUND GUIDANCE;  Surgeon: Cyril Mourning, MD;  Location: [facility];  Service: Gynecology;  Laterality: N/A;   History  Substance Use Topics  . Smoking status: Never Smoker   . Smokeless tobacco: Never Used  . Alcohol Use: No   Family History  Problem Relation Age of Onset  . Heart attack Father   . Hypertension Father   . Atrial fibrillation Mother   . Coronary artery disease Mother   . Diabetes      Grandmother  . Coronary artery disease      Grandmother  . Uterine cancer      Grandmother  . Leukemia      Aunt  . Cancer Maternal Aunt     leukemia  . Cancer Maternal  Uncle     colon   Allergies  Allergen Reactions  . Codeine Nausea And Vomiting  . Morphine Nausea And Vomiting  . Prednisone Palpitations    Rapid hear beat   Current Outpatient Prescriptions on File Prior to Visit  Medication Sig Dispense Refill  . ALPRAZolam (XANAX) 0.25 MG tablet TAKE ONE TABLET EVERY DAY AS NEEDED FOR ANXIETY/SLEEP.    Marland Kitchen aspirin 81 MG tablet Take 81 mg by mouth daily.     . chlorthalidone (HYGROTON) 50 MG tablet Take 1 tablet (50 mg total) by mouth daily as needed. For swelling. 90 tablet 1  . Cholecalciferol (VITAMIN D) 2000 UNITS CAPS Take 1 capsule by mouth daily.    . diclofenac sodium (VOLTAREN) 1 % GEL Apply 2 g topically 4 (four) times daily. Rub into affected area of foot 2 to 4 times daily (Patient taking differently: Apply 2 g topically 4 (four) times daily. Rub into affected area of foot 2 to  4 times daily as needed) 100 g 2  . dicyclomine (BENTYL) 10 MG capsule Take 1 capsule (10 mg total) by mouth 2 (two) times daily as needed. 180 capsule 1  . letrozole (FEMARA) 2.5 MG tablet Take 2.5 mg by mouth daily.    Marland Kitchen levothyroxine (SYNTHROID, LEVOTHROID) 112 MCG tablet Take 1 tablet (112 mcg total) by mouth daily. 90 tablet 1  . Multiple Vitamin (MULTIVITAMIN) capsule Take 1 capsule by mouth daily.      . niacin 500 MG tablet Take 1,000 mg by mouth at bedtime.     . ranitidine (ZANTAC) 150 MG tablet Take 150 mg by mouth as needed.       No current facility-administered medications on file prior to visit.      Review of Systems Review of Systems  Constitutional: Negative for fever, appetite change, fatigue and unexpected weight change.  Eyes: Negative for pain and visual disturbance.  Respiratory: Negative for cough and shortness of breath.   Cardiovascular: Negative for cp or palpitations    Gastrointestinal: neg for hematemesis, black stool or blood in stool, pos for abd pain /bloating/constipation .  Genitourinary: Negative for urgency and frequency.  Skin: Negative for pallor or rash   Neurological: Negative for weakness, light-headedness, numbness and headaches.  Hematological: Negative for adenopathy. Does not bruise/bleed easily.  Psychiatric/Behavioral: Negative for dysphoric mood. The patient is not nervous/anxious.         Objective:   Physical Exam  Constitutional: She appears well-developed and well-nourished. No distress.  HENT:  Head: Normocephalic and atraumatic.  Eyes: Conjunctivae and EOM are normal. Pupils are equal, round, and reactive to light. No scleral icterus.  Neck: Normal range of motion. Neck supple.  Cardiovascular: Normal rate and regular rhythm.   Pulmonary/Chest: Effort normal and breath sounds normal. No respiratory distress. She has no wheezes. She has no rales.  Abdominal: Soft. Normal appearance, normal aorta and bowel sounds are normal. She  exhibits no abdominal bruit, no pulsatile midline mass and no mass. There is no hepatosplenomegaly. There is generalized tenderness. There is no rigidity, no rebound, no guarding, no CVA tenderness, no tenderness at McBurney's point and negative Murphy's sign.  Musculoskeletal: She exhibits no edema.  Lymphadenopathy:    She has no cervical adenopathy.  Neurological: She is alert.  Skin: Skin is warm and dry. No rash noted. No erythema. No pallor.  No jaundice   Psychiatric: She has a normal mood and affect.  Nursing note and vitals reviewed.  Assessment & Plan:   Problem List Items Addressed This Visit    Abdominal pain - Primary    Diffuse and at times worse in bilat upper quads  Assoc with episode of n/v and constipation  Hx of tics and constipation and IBS and occ gerd  Differential is wide Lab today xr today (looking for air fluid levels or signs of bowel obst) Low index of susp to order abd [name] as well   Pend results Update if symptoms re occur       Relevant Orders   CBC with Differential/Platelet (Completed)   Comprehensive metabolic panel (Completed)   Lipase (Completed)   DG Abd 2 Views (Completed)   Nausea and vomiting    In pt with constipation and IBS and ? Hx of gallbladder probs in the past (never really dx) Last Thursday after taking 2 dulcolax and eating dinner  Acc by abd pain (diffuse) and bloating  Nausea is imp but poor appetite and foot intolerance  abd xr today (r/o bowel obst) Lab today  Consider [name] of gb       Relevant Orders   DG Abd 2 Views (Completed)

## 2015-06-14 NOTE — Progress Notes (Signed)
Pre visit review using our clinic review tool, if applicable. No additional management support is needed unless otherwise documented below in the visit note. 

## 2015-06-14 NOTE — Assessment & Plan Note (Signed)
Diffuse and at times worse in bilat upper quads  Assoc with episode of n/v and constipation  Hx of tics and constipation and IBS and occ gerd  Differential is wide Lab today xr today (looking for air fluid levels or signs of bowel obst) Low index of susp to order abd [name] as well   Pend results Update if symptoms re occur

## 2015-06-14 NOTE — Patient Instructions (Signed)
Xray of abdomen today  Labs today  If symptoms worsen please let me know Eat bland and also avoid nuts and seeds and corn  Get back on zantac - twice daily

## 2015-06-15 ENCOUNTER — Telehealth: Payer: Self-pay

## 2015-06-15 NOTE — Telephone Encounter (Signed)
I released them on mychart last night  If problems on mychart or if she wants to dis enroll- please do so  See comments

## 2015-06-15 NOTE — Telephone Encounter (Signed)
Pt left v/m requesting cb at [phone] with lab and xray results when available.

## 2015-06-15 NOTE — Assessment & Plan Note (Signed)
In pt with constipation and IBS and ? Hx of gallbladder probs in the past (never really dx) Last Thursday after taking 2 dulcolax and eating dinner  Acc by abd pain (diffuse) and bloating  Nausea is imp but poor appetite and foot intolerance  abd xr today (r/o bowel obst) Lab today  Consider [name] of gb

## 2015-06-16 NOTE — Telephone Encounter (Signed)
She can go back to a regular diet as long as she is not having symptoms

## 2015-06-16 NOTE — Telephone Encounter (Signed)
Pt notified of Dr. Tower's comments  

## 2015-06-16 NOTE — Telephone Encounter (Signed)
Pt did view results on mychart. Pt wanted me to ask Dr. Glori Bickers if she should still stick to a bland diet or is it okay to go back to a regular diet when she starts taking the Miralax, please advise

## 2015-06-16 NOTE — Telephone Encounter (Signed)
Pt calling checking on test results  She cannot get into my chart [phone]

## 2015-06-23 ENCOUNTER — Telehealth: Payer: Self-pay | Admitting: *Deleted

## 2015-06-23 NOTE — Telephone Encounter (Signed)
Pt notified of Dr. Tower's recommendations  

## 2015-06-23 NOTE — Telephone Encounter (Signed)
Pt wanted me to ask Dr. Glori Bickers a question. Since pt has IBS and is having problems with bowel issues pt wanted me to ask you if you think it's a good idea for her to take a probiotic. If so which one do you recommend and is there a strength or amount she should be taking daily, please advise

## 2015-06-23 NOTE — Telephone Encounter (Signed)
If she wants to try one I would recommend Align (otc)  Try it for 2 weeks as directed -if not helpful do not continue it

## 2015-08-26 ENCOUNTER — Other Ambulatory Visit: Payer: Self-pay | Admitting: Family Medicine

## 2015-09-08 ENCOUNTER — Other Ambulatory Visit: Payer: Self-pay | Admitting: Family Medicine

## 2015-09-09 NOTE — Telephone Encounter (Signed)
Px written for call in   

## 2015-09-09 NOTE — Telephone Encounter (Signed)
Rx called in as prescribed 

## 2015-09-09 NOTE — Telephone Encounter (Signed)
Electronic refill request, last time it was refilled that I can tell was in 2015, please advise

## 2015-10-03 ENCOUNTER — Telehealth: Payer: Self-pay | Admitting: Family Medicine

## 2015-10-03 NOTE — Telephone Encounter (Signed)
If any problems breathing /fever or other symptoms please go to ED Otherwise f/u when able with first avail (may get an xray) Not a lot we can do for a broken rib but the risk would be if it was displaced (in that case it could damage lung tissue)- that would manifest with more breathing symptoms

## 2015-10-03 NOTE — Telephone Encounter (Signed)
Called pt back and she advise me she was really busy and has family coming in town this week so she will go to an UC today after she gets everyone settled at her home

## 2015-10-03 NOTE — Telephone Encounter (Signed)
Spoke with pt and she isn't having any fever or trouble breathing. Pt requested that I call back at 1:00pm to schedule an appt for her due to her being with her mother and she didn't want her mother to know/worry about her injury

## 2015-10-03 NOTE — Telephone Encounter (Signed)
Pt came in today with her mom.  She thinks she cracked a rib on Saturday working in yard What can she do about this?

## 2015-10-04 ENCOUNTER — Ambulatory Visit (INDEPENDENT_AMBULATORY_CARE_PROVIDER_SITE_OTHER): Payer: PPO | Admitting: Primary Care

## 2015-10-04 ENCOUNTER — Encounter: Payer: Self-pay | Admitting: Primary Care

## 2015-10-04 ENCOUNTER — Ambulatory Visit (INDEPENDENT_AMBULATORY_CARE_PROVIDER_SITE_OTHER)
Admission: RE | Admit: 2015-10-04 | Discharge: 2015-10-04 | Disposition: A | Discharge status: Home / Self Care | Payer: PPO | Source: Ambulatory Visit | Attending: Primary Care | Admitting: Primary Care

## 2015-10-04 VITALS — BP 138/82 | HR 74 | Temp 98.7°F | Ht 63.5 in | Wt 142.1 lb

## 2015-10-04 DIAGNOSIS — R0781 Pleurodynia: Secondary | ICD-10-CM | POA: Diagnosis not present

## 2015-10-04 NOTE — Patient Instructions (Signed)
Complete xray(s) prior to leaving today. I will contact you regarding your results.  You may take ibuprofen 600 mg three times daily as needed, for the next 2 weeks,  for rib pain. Please do not exceed 2400 mg of ibuprofen in 24 hours.   Please call me if your pain does not improve or becomes worse. Also notify me if you develop shortness of breath.  It was a pleasure meeting you!

## 2015-10-04 NOTE — Progress Notes (Signed)
Subjective:    Patient ID: Gabrielle Webb, female    DOB: 1941-11-17, 74 y.o.   MRN: [id number]  HPI  [name] is a 74 year old female who presents today with a chief complaint of rib pain. She was trimming bushes with electric clippers several days ago, bent down to trim the bottom bush, twisted her body around to reach the bush, and noticed a sudden pop with pain to her ribs on the right side. Her pain is located to the right lower rib with radiation to her right posterior flank. She's been taking ibuprofen with some relief.   Review of Systems  Constitutional: Negative for fever.  Respiratory: Negative for shortness of breath.   Cardiovascular: Negative for chest pain.  Genitourinary: Positive for flank pain.  Musculoskeletal: Positive for myalgias.  Skin: Negative for color change and wound.       Past Medical History  Diagnosis Date  . Allergy   . Anxiety   . GERD (gastroesophageal reflux disease)   . Osteoporosis   . Diverticulosis of colon   . Vertigo   . Hypothyroid   . Breast cancer ([place]) 7/12    Right  . Skin cancer     basal and squamous cell  . Cellulitis   . Hx of radiation therapy 06/20/11 - 07/11/11    right breast  . PONV (postoperative nausea and vomiting)     Social History   Social History  . Marital Status: Widowed    Spouse Name: N/A  . Number of Children: 1  . Years of Education: N/A   Occupational History  . insurance agency    Social History Main Topics  . Smoking status: Never Smoker   . Smokeless tobacco: Never Used  . Alcohol Use: No  . Drug Use: No  . Sexual Activity: Not on file   Other Topics Concern  . Not on file   Social History Narrative   Exercises on [employer]    Past Surgical History  Procedure Laterality Date  . Breast biopsy    . Knee surgery  1962  . Appendectomy  07/2007  . Breast lumpectomy Right 05/16/2011  . Dilation and curettage of uterus N/A 05/31/2014    Procedure: DILATATION AND  CURETTAGE WITH ULTRASOUND GUIDANCE;  Surgeon: Cyril Mourning, MD;  Location: [facility];  Service: Gynecology;  Laterality: N/A;    Family History  Problem Relation Age of Onset  . Heart attack Father   . Hypertension Father   . Atrial fibrillation Mother   . Coronary artery disease Mother   . Diabetes      Grandmother  . Coronary artery disease      Grandmother  . Uterine cancer      Grandmother  . Leukemia      Aunt  . Cancer Maternal Aunt     leukemia  . Cancer Maternal Uncle     colon    Allergies  Allergen Reactions  . Codeine Nausea And Vomiting  . Morphine Nausea And Vomiting  . Prednisone Palpitations    Rapid hear beat    Current Outpatient Prescriptions on File Prior to Visit  Medication Sig Dispense Refill  . ALPRAZolam (XANAX) 0.25 MG tablet TAKE ONE TABLET BY MOUTH EVERY DAY AS NEEDED FOR ANXIETY OR SLEEP 30 tablet 1  . aspirin 81 MG tablet Take 81 mg by mouth daily.     . chlorthalidone (HYGROTON) 50 MG tablet Take 1 tablet (50 mg total) by  mouth daily as needed. For swelling. 90 tablet 1  . Cholecalciferol (VITAMIN D) 2000 UNITS CAPS Take 1 capsule by mouth daily.    . diclofenac sodium (VOLTAREN) 1 % GEL Apply 2 g topically 4 (four) times daily. Rub into affected area of foot 2 to 4 times daily (Patient taking differently: Apply 2 g topically 4 (four) times daily. Rub into affected area of foot 2 to 4 times daily as needed) 100 g 2  . dicyclomine (BENTYL) 10 MG capsule Take 1 capsule (10 mg total) by mouth 2 (two) times daily as needed. 180 capsule 1  . letrozole (FEMARA) 2.5 MG tablet Take 2.5 mg by mouth daily.    Marland Kitchen levothyroxine (SYNTHROID, LEVOTHROID) 112 MCG tablet TAKE 1 TABLET BY MOUTH DAILY 90 tablet 1  . Multiple Vitamin (MULTIVITAMIN) capsule Take 1 capsule by mouth daily.      . niacin 500 MG tablet Take 1,000 mg by mouth at bedtime.     . ranitidine (ZANTAC) 150 MG tablet Take 150 mg by mouth as needed.       No current facility-administered  medications on file prior to visit.    BP 138/82 mmHg  Pulse 74  Temp(Src) 98.7 F (37.1 C) (Oral)  Ht 5' 3.5" (1.613 m)  Wt 142 lb 1.9 oz (64.465 kg)  BMI 24.78 kg/m2  SpO2 94%  LMP 12/03/1988     Objective:   Physical Exam  Constitutional: She appears well-nourished.  Cardiovascular: Normal rate and regular rhythm.   Pulmonary/Chest: Effort normal and breath sounds normal. She has no decreased breath sounds.  Musculoskeletal:  Tenderness to right lateral chest wall near T6-7 and to right posterior flank. No decrease in ROM.  Skin: Skin is warm and dry.          Assessment & Plan:  Rib pain:  Right sided rip pain since since trimming bushes and twisting body in odd position. No SOB. Tenderness to right lateral chest wall near T6-7 and also to right posterior flank. No decreased breath sounds or obvious signs of pneumothorax. Suspect MSK involvement. Xray of unilateral ribs without fracture or pneumothorax. Treat with supportive measures and return precautions provided.

## 2015-10-04 NOTE — Progress Notes (Signed)
Pre visit review using our clinic review tool, if applicable. No additional management support is needed unless otherwise documented below in the visit note. 

## 2015-10-25 ENCOUNTER — Other Ambulatory Visit: Payer: Self-pay | Admitting: Family Medicine

## 2015-10-25 NOTE — Telephone Encounter (Signed)
Electronic refill request, I don't see med on med list and I don't think you have prescribed this before, the only med that I see prescribed in the past is boniva, please advise

## 2015-10-25 NOTE — Telephone Encounter (Signed)
Not sure-please call pt and clarify

## 2015-10-26 NOTE — Telephone Encounter (Signed)
Please refill for a year  I'm surprised they put her on this since she already had 5 y of boniva (same class of med) - and I do not think the data show benefit from more than 5 y of med - but I will go with the oncologist recommendation

## 2015-10-26 NOTE — Telephone Encounter (Signed)
done

## 2015-10-26 NOTE — Telephone Encounter (Signed)
Pt said when her oncologist Dr. Humphrey Rolls retired she went to [facility] and saw Dr. Harden Mo who prescribed her a years worth of fosamax, but when Dr. Jana Hakim started at the [facility] here she decided to start back going there and see Dr. Jana Hakim instead of having to go all the way to [facility] for her appointments. Pt has been on this medication for a year but she needs a new Rx sent to the pharmacy because she is out of meds, please advise

## 2015-12-01 ENCOUNTER — Ambulatory Visit
Admission: RE | Admit: 2015-12-01 | Discharge: 2015-12-01 | Disposition: A | Discharge status: Home / Self Care | Payer: PPO | Source: Ambulatory Visit | Attending: Oncology | Admitting: Oncology

## 2015-12-01 DIAGNOSIS — C50911 Malignant neoplasm of unspecified site of right female breast: Secondary | ICD-10-CM

## 2015-12-01 DIAGNOSIS — M810 Age-related osteoporosis without current pathological fracture: Secondary | ICD-10-CM

## 2015-12-19 ENCOUNTER — Other Ambulatory Visit (HOSPITAL_BASED_OUTPATIENT_CLINIC_OR_DEPARTMENT_OTHER): Payer: PPO

## 2015-12-19 DIAGNOSIS — C50911 Malignant neoplasm of unspecified site of right female breast: Secondary | ICD-10-CM

## 2015-12-19 DIAGNOSIS — C50411 Malignant neoplasm of upper-outer quadrant of right female breast: Secondary | ICD-10-CM

## 2015-12-19 DIAGNOSIS — M810 Age-related osteoporosis without current pathological fracture: Secondary | ICD-10-CM

## 2015-12-19 LAB — CBC WITH DIFFERENTIAL/PLATELET
BASO%: 0.4 % (ref 0.0–2.0)
BASOS ABS: 0 10*3/uL (ref 0.0–0.1)
EOS%: 2 % (ref 0.0–7.0)
Eosinophils Absolute: 0.1 10*3/uL (ref 0.0–0.5)
HEMATOCRIT: 40.5 % (ref 34.8–46.6)
HEMOGLOBIN: 13.5 g/dL (ref 11.6–15.9)
LYMPH#: 2.1 10*3/uL (ref 0.9–3.3)
LYMPH%: 32 % (ref 14.0–49.7)
MCH: 29.6 pg (ref 25.1–34.0)
MCHC: 33.2 g/dL (ref 31.5–36.0)
MCV: 89 fL (ref 79.5–101.0)
MONO#: 0.5 10*3/uL (ref 0.1–0.9)
MONO%: 8.4 % (ref 0.0–14.0)
NEUT%: 57.2 % (ref 38.4–76.8)
NEUTROS ABS: 3.7 10*3/uL (ref 1.5–6.5)
PLATELETS: 174 10*3/uL (ref 145–400)
RBC: 4.55 10*6/uL (ref 3.70–5.45)
RDW: 13.7 % (ref 11.2–14.5)
WBC: 6.5 10*3/uL (ref 3.9–10.3)

## 2015-12-19 LAB — COMPREHENSIVE METABOLIC PANEL
ALT: 13 U/L (ref 0–55)
AST: 20 U/L (ref 5–34)
Albumin: 3.9 g/dL (ref 3.5–5.0)
Alkaline Phosphatase: 55 U/L (ref 40–150)
Anion Gap: 6 mEq/L (ref 3–11)
BUN: 15.4 mg/dL (ref 7.0–26.0)
CHLORIDE: 108 meq/L (ref 98–109)
CO2: 27 meq/L (ref 22–29)
Calcium: 9.3 mg/dL (ref 8.4–10.4)
Creatinine: 0.7 mg/dL (ref 0.6–1.1)
EGFR: 83 mL/min/{1.73_m2} — AB (ref >90)
GLUCOSE: 82 mg/dL (ref 70–140)
Potassium: 4.3 mEq/L (ref 3.5–5.1)
SODIUM: 142 meq/L (ref 136–145)
Total Bilirubin: 0.51 mg/dL (ref 0.20–1.20)
Total Protein: 6.9 g/dL (ref 6.4–8.3)

## 2015-12-26 ENCOUNTER — Ambulatory Visit (HOSPITAL_BASED_OUTPATIENT_CLINIC_OR_DEPARTMENT_OTHER): Payer: PPO | Admitting: Oncology

## 2015-12-26 ENCOUNTER — Telehealth: Payer: Self-pay | Admitting: Oncology

## 2015-12-26 VITALS — BP 122/71 | HR 91 | Temp 98.3°F | Resp 18 | Ht 63.5 in | Wt 136.3 lb

## 2015-12-26 DIAGNOSIS — M810 Age-related osteoporosis without current pathological fracture: Secondary | ICD-10-CM

## 2015-12-26 DIAGNOSIS — C50411 Malignant neoplasm of upper-outer quadrant of right female breast: Secondary | ICD-10-CM | POA: Diagnosis not present

## 2015-12-26 DIAGNOSIS — Z170 Estrogen receptor positive status [ER+]: Secondary | ICD-10-CM

## 2015-12-26 MED ORDER — LETROZOLE 2.5 MG PO TABS: 2.5000 mg | ORAL_TABLET | Freq: Every day | ORAL | Status: DC

## 2015-12-26 NOTE — Progress Notes (Signed)
[place]  [phone] [phone]     ID: XCARET MORAD DOB: 08-15-1941  MR#: [id number]  UVO#:[id number]  Patient Care Team: Abner Greenspan, MD as PCP - General Abner Greenspan, MD Thea Silversmith, MD as Consulting Physician (Radiation Oncology) Neldon Mc, MD as Surgeon (General Surgery) Thea Silversmith, MD as Consulting Physician (Radiation Oncology) Chauncey Cruel, MD as Consulting Physician (Oncology) Dian Queen, MD as Consulting Physician (Obstetrics and Gynecology) PCP: Loura Pardon, MD OTHER MD: Deitra Mayo MD  CHIEF COMPLAINT: Estrogen receptor positive breast cancer  CURRENT TREATMENT: Letrozole   BREAST CANCER HISTORY: From Dr. Eusebio Me initial intake note 05/10/2011:  "Ms. Gartley is a pleasant 75 year old female.  She underwent a screening mammogram on 04/20/2011 and was found to have an asymmetric spiculated mass in the upper outer quadrant.  Ultrasound confirmed the presence of this mass, and a biopsy on 05/02/2011 revealed a low-grade invasive ductal carcinoma.  This measured 11 mm and was ER, PR positive, HER2 negative and Ki-67 was 15%.  An MRI of the bilateral breasts was performed on 05/07/2011.  This showed a 1.5 x 1.0 cm mass.  Ms. Pomplun notes a single episode of possible stinging in her right breast prior to her mammogram.  She has a long history of fibrocystic disease though, so she did not feel anything was greatly out of the ordinary.  Interestingly, she did have a history of several breast biopsies and excisions in 1987 and 1988.  She brings these old clinic records with her, and it looks like some fibrocystic disease was removed as well as an area of atypical cells.  The differential is quite broad, but it is possible given that she has developed this in a very similar area that this was LCIS.  Regardless, at that time when she had those biopsies, she had been on hormone replacement therapy for  about a month and took herself off."  Her subsequent history is as detailed below  INTERVAL HISTORY: Mairead returns today for follow-up of her estrogen receptor positive breast cancer. She continues on letrozole. She tolerates that well, with hot flashes and vaginal dryness not being major issue spare chipped incident about $4 a month. She did just have a repeat bone density which shows stable osteoporosis.  REVIEW OF SYSTEMS: Jahira is still grieving for her husband. She was dreading thinking that the room we met in today may have been the room in which Dr. Yancey Flemings gave them the dreadful news about his disease. She lives in town home and is surrounded by many women a large proportion of them widows. So she does have some support. She enjoys her grandchildren, one of home, the oldest, is special needs, the middle one being at [place] and the younger 1 applying to [place]. She had a flulike illness this weekend and she still feels a little weak from that, but did not have any fever, rash, arthralgias or diarrhea associated with this. She really enjoys her cats at otherwise a detailed review of systems today was stable.  PAST MEDICAL HISTORY: Past Medical History  Diagnosis Date  . Allergy   . Anxiety   . GERD (gastroesophageal reflux disease)   . Osteoporosis   . Diverticulosis of colon   . Vertigo   . Hypothyroid   . Breast cancer ([place]) 7/12    Right  . Skin cancer     basal and squamous cell  . Cellulitis   . Hx of radiation therapy 06/20/11 -  07/11/11    right breast  . PONV (postoperative nausea and vomiting)     PAST SURGICAL HISTORY: Past Surgical History  Procedure Laterality Date  . Breast biopsy    . Knee surgery  1962  . Appendectomy  07/2007  . Breast lumpectomy Right 05/16/2011  . Dilation and curettage of uterus N/A 05/31/2014    Procedure: DILATATION AND CURETTAGE WITH ULTRASOUND GUIDANCE;  Surgeon: Cyril Mourning, MD;  Location: [facility];  Service: Gynecology;   Laterality: N/A;    FAMILY HISTORY Family History  Problem Relation Age of Onset  . Heart attack Father   . Hypertension Father   . Atrial fibrillation Mother   . Coronary artery disease Mother   . Diabetes      Grandmother  . Coronary artery disease      Grandmother  . Uterine cancer      Grandmother  . Leukemia      Aunt  . Cancer Maternal Aunt     leukemia  . Cancer Maternal Uncle     colon   the patient's father died at the age of 7 from a myocardial infarction. The patient's mother is 14 years old currently. She has atrial fibrillation. She lives at [facility]. The patient is an only child. There is no history of breast or ovarian cancer in the family to her knowledge  GYNECOLOGIC HISTORY:  Patient's last menstrual period was 12/03/1988. Menarche age 6 first live birth age 34. She stopped having periods in her late 68s. 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 Her husband died from widely metastatic cancer of unknown primary (it was never biopsied) approximately a year ago. She lives alone in a [place], with 2 cats. Her son [name] "[name] "Careers adviser lives in [place]. He works out of his home for [employer]. The patient has 3 grandchildren, the oldest 37, 64, his micro-cephalic, the others are 70 and 73 years old. The patient attends a local church in [place]: In place. The patient's son [name] "[name] " [name] is her healthcare part of attorney. He can be reached at [phone].   HEALTH MAINTENANCE: Social History  Substance Use Topics  . Smoking status: Never Smoker   . Smokeless tobacco: Never Used  . Alcohol Use: No     Colonoscopy:  PAP:  Bone density:  Lipid panel:  Allergies  Allergen Reactions  . Codeine Nausea And Vomiting  . Morphine Nausea And Vomiting  . Prednisone Palpitations    Rapid hear beat    Current Outpatient  Prescriptions  Medication Sig Dispense Refill  . alendronate (FOSAMAX) 70 MG tablet ONE TABLET BY MOUTH EVERY 7 DAYS TAKE WITH FULL GLASS OF WATER DO NOTLIE DOWNFOR THE NEXT 30 MIN 4 tablet 11  . ALPRAZolam (XANAX) 0.25 MG tablet TAKE ONE TABLET BY MOUTH EVERY DAY AS NEEDED FOR ANXIETY OR SLEEP 30 tablet 1  . aspirin 81 MG tablet Take 81 mg by mouth daily.     . chlorthalidone (HYGROTON) 50 MG tablet Take 1 tablet (50 mg total) by mouth daily as needed. For swelling. 90 tablet 1  . Cholecalciferol (VITAMIN D) 2000 UNITS CAPS Take 1 capsule by mouth daily.    . diclofenac sodium (VOLTAREN) 1 % GEL Apply 2 g topically 4 (four) times daily. Rub into affected area of foot 2 to 4 times daily (Patient taking differently: Apply  2 g topically 4 (four) times daily. Rub into affected area of foot 2 to 4 times daily as needed) 100 g 2  . dicyclomine (BENTYL) 10 MG capsule Take 1 capsule (10 mg total) by mouth 2 (two) times daily as needed. 180 capsule 1  . letrozole (FEMARA) 2.5 MG tablet Take 1 tablet (2.5 mg total) by mouth daily. 90 tablet 4  . levothyroxine (SYNTHROID, LEVOTHROID) 112 MCG tablet TAKE 1 TABLET BY MOUTH DAILY 90 tablet 1  . Multiple Vitamin (MULTIVITAMIN) capsule Take 1 capsule by mouth daily.      . niacin 500 MG tablet Take 1,000 mg by mouth at bedtime.     . ranitidine (ZANTAC) 150 MG tablet Take 150 mg by mouth as needed.       No current facility-administered medications for this visit.    OBJECTIVE: Middle-aged white woman in no acute distress Filed Vitals:   12/26/15 1430  BP: 122/71  Pulse: 91  Temp: 98.3 F (36.8 C)  Resp: 18     Body mass index is 23.76 kg/(m^2).    ECOG FS:0 - Asymptomatic  Sclerae unicteric, EOMs intact Oropharynx clear, dentition in good repair No cervical or supraclavicular adenopathy Lungs no rales or rhonchi Heart regular rate and rhythm Abd soft, nontender, positive bowel sounds MSK no focal spinal tenderness, no upper extremity  lymphedema Neuro: nonfocal, well oriented, appropriate affect Breasts: The right breast is status post lumpectomy and radiation. There is no evidence of local recurrence. The right axilla is benign per the left breast is unremarkable.   LAB RESULTS:  CMP     Component Value Date/Time   NA 142 12/19/2015 1101   NA 140 06/14/2015 1359   K 4.3 12/19/2015 1101   K 4.0 06/14/2015 1359   CL 106 06/14/2015 1359   CL 109* 09/18/2012 0849   CO2 27 12/19/2015 1101   CO2 28 06/14/2015 1359   GLUCOSE 82 12/19/2015 1101   GLUCOSE 87 06/14/2015 1359   GLUCOSE 79 09/18/2012 0849   GLUCOSE 95 12/16/2006 1023   BUN 15.4 12/19/2015 1101   BUN 10 06/14/2015 1359   CREATININE 0.7 12/19/2015 1101   CREATININE 0.68 06/14/2015 1359   CALCIUM 9.3 12/19/2015 1101   CALCIUM 9.3 06/14/2015 1359   PROT 6.9 12/19/2015 1101   PROT 6.7 06/14/2015 1359   ALBUMIN 3.9 12/19/2015 1101   ALBUMIN 4.0 06/14/2015 1359   AST 20 12/19/2015 1101   AST 16 06/14/2015 1359   ALT 13 12/19/2015 1101   ALT 10 06/14/2015 1359   ALKPHOS 55 12/19/2015 1101   ALKPHOS 49 06/14/2015 1359   BILITOT 0.51 12/19/2015 1101   BILITOT 0.5 06/14/2015 1359   GFRNONAA 84* 05/27/2014 1120   GFRAA >90 05/27/2014 1120    INo results found for: SPEP, UPEP  Lab Results  Component Value Date   WBC 6.5 12/19/2015   NEUTROABS 3.7 12/19/2015   HGB 13.5 12/19/2015   HCT 40.5 12/19/2015   MCV 89.0 12/19/2015   PLT 174 12/19/2015      Chemistry      Component Value Date/Time   NA 142 12/19/2015 1101   NA 140 06/14/2015 1359   K 4.3 12/19/2015 1101   K 4.0 06/14/2015 1359   CL 106 06/14/2015 1359   CL 109* 09/18/2012 0849   CO2 27 12/19/2015 1101   CO2 28 06/14/2015 1359   BUN 15.4 12/19/2015 1101   BUN 10 06/14/2015 1359   CREATININE 0.7 12/19/2015 1101  CREATININE 0.68 06/14/2015 1359      Component Value Date/Time   CALCIUM 9.3 12/19/2015 1101   CALCIUM 9.3 06/14/2015 1359   ALKPHOS 55 12/19/2015 1101   ALKPHOS  49 06/14/2015 1359   AST 20 12/19/2015 1101   AST 16 06/14/2015 1359   ALT 13 12/19/2015 1101   ALT 10 06/14/2015 1359   BILITOT 0.51 12/19/2015 1101   BILITOT 0.5 06/14/2015 1359       Lab Results  Component Value Date   LABCA2 17 05/09/2011    No components found for: [id number]  No results for input(s): INR in the last 168 hours.  Urinalysis    Component Value Date/Time   BILIRUBINUR 1+ 06/25/2014 1208   PROTEINUR 30+ 06/25/2014 1208   UROBILINOGEN 0.2 06/25/2014 1208   NITRITE neg. 06/25/2014 1208   LEUKOCYTESUR moderate (2+) 06/25/2014 1208    STUDIES: Dg Bone Density  12/01/2015  EXAM: DUAL X-RAY ABSORPTIOMETRY (DXA) FOR BONE MINERAL DENSITY IMPRESSION: Referring Physician:  Lurline Del PATIENT: Name: KAMBREE, KRAUSS Patient ID: [id number] Birth Date: [birth year] Height: 63.7 in. Sex: Female Measured: 12/01/2015 Weight: 136.0 lbs. Indications: Advanced Age, Breast Cancer History, Caucasian, Estrogen Deficient, Family History of Osteoporosis, Femara, Premenopausal, Synthroid Fractures: Treatments: Fosamax, Vitamin D (E933.5) ASSESSMENT The BMD measured at Femur Neck Left is 0.691 g/cm2 with a T-score of -2.5. This patient is considered osteoporotic according to [facility]) criteria. L-4 was excluded due to degenerative changes. Site Region Measured Date Measured Age YA BMD Significant CHANGE T-score AP Spine  L1-L3     12/01/2015    [age]         -2.5    0.879 g/cm2 DualFemur Neck Left 12/01/2015    [age]         -2.5    0.691 g/cm2 World Health Organization [facility]) criteria for post-menopausal, Caucasian Women: Normal       T-score at or above -1 SD Osteopenia   T-score between -1 and -2.5 SD Osteoporosis T-score at or below -2.5 SD RECOMMENDATION: [place] recommends that FDA-approved medical therapies be considered in postmenopausal women and men age 62 or older with a: 1. Hip or vertebral (clinical or morphometric) fracture. 2.  T-score of <-2.5 at the spine or hip. 3. Ten-year fracture probability by FRAX of 3% or greater for hip fracture or 20% or greater for major osteoporotic fracture. All treatment decisions require clinical judgment and consideration of individual patient factors, including patient preferences, co-morbidities, previous drug use, risk factors not captured in the FRAX model (e.g. falls, vitamin D deficiency, increased bone turnover, interval significant decline in bone density) and possible under - or over-estimation of fracture risk by FRAX. All patients should ensure an adequate intake of dietary calcium (1200 mg/d) and vitamin D (800 IU daily) unless contraindicated. FOLLOW-UP: People with diagnosed cases of osteoporosis or at high risk for fracture should have regular bone mineral density tests. For patients eligible for Medicare, routine testing is allowed once every 2 years. The testing frequency can be increased to one year for patients who have rapidly progressing disease, those who are receiving or discontinuing medical therapy to restore bone mass, or have additional risk factors. I have reviewed this report, and agree with the above findings. [facility] Radiology Electronically Signed   By: Lajean Manes M.D.   On: 12/01/2015 12:01    ASSESSMENT: 75 y.o.  [place] woman status post right breast upper outer quadrant biopsy 05/02/2011 for a low-grade invasive ductal carcinoma which  was estrogen receptor 100% positive, progesterone receptor 22% positive, with an MIB-1 of 17% and no HER-2 amplification, the signals ratio being 1.68  ([id number])  2. Status post right lumpectomy and sentinel lymph node sampling 05/16/2011 for a pT1c pN0, stage IA invasive ductal carcinoma, low-grade, with negative margins  3. Oncotype DX score of 13 predicted an 8% risk of recurrence outside the breast within 10 years if the patient's only systemic therapy is tamoxifen for 5 years. He also predicted no benefit from  chemotherapy.  3. Adjuvant radiation completed 07/23/2011  4. Tamoxifen started August 2012, discontinued June 2015  5. Letrozole started August 2015  (a) alendronate started August 2015  (b) bone density at the [facility] 12/01/2015 shows osteoporosis with a T score of -2.5  PLAN: Taunya will be completing 5 years of antiestrogen therapy this summer. We anticipated the discussion and started laying out her options.  She can discontinue anti-estrogens at that point and "get out of the cancer business". She can discontinue her anti-estrogens and visit [name] on a once a year basis through the survivorship program. She can continue the letrozole and continue the alendronate. Alternatively she can continue the letrozole but switch to either Denosumab or zolendronate.  I think what she is leaning towards his continuing the letrozole for an additional 5 years, which will give her a 3% risk reduction in terms of breast cancer risk, but switch to either Denosumab or zolendronate. Since one of her friends is on Reclast, she is considering that. I asked her to mention this to her dentist when she next has her teeth looked at, but she is already on alendronate which can also cause osteonecrosis of the jaw. We also discussed the bony aches and hypocalcemia problems that can occur from bisphosphonates and Denosumab.  I gave her a copy of the [employer] program and encouraged her to do that and also to do Silver sneakers, which is before by her insurance. She continues with regular weightbearing exercise she may at least earlier stabilize the osteoporosis problem but also perhaps improve it.  She is still grieving actively for her husband. She is also primary caregiver for her 84 year old mother which means she does not have the opportunity to travel. Her overall attitude however is very positive.  She will see me again in August. She knows to call for any problems that may develop before that  visit.  Chauncey Cruel, MD   12/26/2015 2:49 PM Medical Oncology and Hematology [facility] [address] [place], [place] [address] Tel. [phone]    Fax. [phone]

## 2015-12-26 NOTE — Telephone Encounter (Signed)
Appointments made and avs printed °

## 2015-12-29 DIAGNOSIS — D2262 Melanocytic nevi of left upper limb, including shoulder: Secondary | ICD-10-CM | POA: Diagnosis not present

## 2015-12-29 DIAGNOSIS — D225 Melanocytic nevi of trunk: Secondary | ICD-10-CM | POA: Diagnosis not present

## 2015-12-29 DIAGNOSIS — X32XXXA Exposure to sunlight, initial encounter: Secondary | ICD-10-CM | POA: Diagnosis not present

## 2015-12-29 DIAGNOSIS — L570 Actinic keratosis: Secondary | ICD-10-CM | POA: Diagnosis not present

## 2015-12-29 DIAGNOSIS — Z85828 Personal history of other malignant neoplasm of skin: Secondary | ICD-10-CM | POA: Diagnosis not present

## 2015-12-29 DIAGNOSIS — L821 Other seborrheic keratosis: Secondary | ICD-10-CM | POA: Diagnosis not present

## 2016-01-16 ENCOUNTER — Other Ambulatory Visit: Payer: Self-pay | Admitting: Family Medicine

## 2016-01-16 MED ORDER — FLUTICASONE PROPIONATE 50 MCG/ACT NA SUSP: 2.0000 | Freq: Every day | NASAL | Status: DC

## 2016-01-16 NOTE — Progress Notes (Signed)
Asked for flonase while here with another family member  Electronically Signed  By: Owens Loffler, MD On: 01/16/2016 2:53 PM

## 2016-03-20 ENCOUNTER — Other Ambulatory Visit: Payer: Self-pay | Admitting: Family Medicine

## 2016-04-02 ENCOUNTER — Telehealth: Payer: Self-pay | Admitting: Family Medicine

## 2016-04-02 NOTE — Telephone Encounter (Signed)
LM for pt to sch AWV on 6/9 at 8:15, mn

## 2016-04-05 NOTE — Telephone Encounter (Signed)
Pt made appointment.

## 2016-04-24 ENCOUNTER — Other Ambulatory Visit: Payer: Self-pay | Admitting: Oncology

## 2016-04-24 DIAGNOSIS — Z853 Personal history of malignant neoplasm of breast: Secondary | ICD-10-CM

## 2016-04-25 DIAGNOSIS — J019 Acute sinusitis, unspecified: Secondary | ICD-10-CM | POA: Diagnosis not present

## 2016-05-06 ENCOUNTER — Telehealth: Payer: Self-pay | Admitting: Family Medicine

## 2016-05-06 DIAGNOSIS — Z0000 Encounter for general adult medical examination without abnormal findings: Secondary | ICD-10-CM | POA: Insufficient documentation

## 2016-05-06 NOTE — Telephone Encounter (Signed)
-----   Message from Marchia Bond sent at 05/04/2016  1:51 PM EDT ----- Regarding: Cpx labs Fri 6/9, need orders. Thanks! :-) Please order  future cpx labs for pt's upcoming lab appt. Thanks Aniceto Boss

## 2016-05-11 ENCOUNTER — Other Ambulatory Visit (INDEPENDENT_AMBULATORY_CARE_PROVIDER_SITE_OTHER): Payer: PPO

## 2016-05-11 ENCOUNTER — Ambulatory Visit (INDEPENDENT_AMBULATORY_CARE_PROVIDER_SITE_OTHER): Payer: PPO

## 2016-05-11 VITALS — BP 112/68 | HR 70 | Temp 98.0°F | Ht 63.0 in | Wt 136.5 lb

## 2016-05-11 DIAGNOSIS — Z0000 Encounter for general adult medical examination without abnormal findings: Secondary | ICD-10-CM

## 2016-05-11 LAB — CBC WITH DIFFERENTIAL/PLATELET
BASOS ABS: 0 10*3/uL (ref 0.0–0.1)
BASOS PCT: 0.3 % (ref 0.0–3.0)
EOS ABS: 0.1 10*3/uL (ref 0.0–0.7)
Eosinophils Relative: 1.2 % (ref 0.0–5.0)
HCT: 42.4 % (ref 36.0–46.0)
HEMOGLOBIN: 14.2 g/dL (ref 12.0–15.0)
LYMPHS PCT: 34.4 % (ref 12.0–46.0)
Lymphs Abs: 2.9 10*3/uL (ref 0.7–4.0)
MCHC: 33.5 g/dL (ref 30.0–36.0)
MCV: 88.4 fl (ref 78.0–100.0)
MONO ABS: 0.9 10*3/uL (ref 0.1–1.0)
Monocytes Relative: 10.3 % (ref 3.0–12.0)
NEUTROS ABS: 4.5 10*3/uL (ref 1.4–7.7)
Neutrophils Relative %: 53.8 % (ref 43.0–77.0)
PLATELETS: 185 10*3/uL (ref 150.0–400.0)
RBC: 4.8 Mil/uL (ref 3.87–5.11)
RDW: 14.1 % (ref 11.5–15.5)
WBC: 8.4 10*3/uL (ref 4.0–10.5)

## 2016-05-11 LAB — COMPREHENSIVE METABOLIC PANEL
ALK PHOS: 57 U/L (ref 39–117)
ALT: 12 U/L (ref 0–35)
AST: 18 U/L (ref 0–37)
Albumin: 4.4 g/dL (ref 3.5–5.2)
BILIRUBIN TOTAL: 0.8 mg/dL (ref 0.2–1.2)
BUN: 15 mg/dL (ref 6–23)
CO2: 29 mEq/L (ref 19–32)
CREATININE: 0.92 mg/dL (ref 0.40–1.20)
Calcium: 9.8 mg/dL (ref 8.4–10.5)
Chloride: 103 mEq/L (ref 96–112)
GFR: 63.6 mL/min (ref >60.00)
GLUCOSE: 92 mg/dL (ref 70–99)
Potassium: 3.8 mEq/L (ref 3.5–5.1)
SODIUM: 140 meq/L (ref 135–145)
Total Protein: 7.2 g/dL (ref 6.0–8.3)

## 2016-05-11 LAB — TSH: TSH: 0.32 u[IU]/mL — AB (ref 0.35–4.50)

## 2016-05-11 LAB — LIPID PANEL
Cholesterol: 221 mg/dL — ABNORMAL HIGH (ref 0–200)
HDL: 64.3 mg/dL (ref >39.00)
LDL Cholesterol: 137 mg/dL — ABNORMAL HIGH (ref 0–99)
NONHDL: 157.09
Total CHOL/HDL Ratio: 3
Triglycerides: 102 mg/dL (ref 0.0–149.0)
VLDL: 20.4 mg/dL (ref 0.0–40.0)

## 2016-05-11 NOTE — Progress Notes (Signed)
Subjective:   Gabrielle Webb is a 75 y.o. female who presents for Medicare Annual (Subsequent) preventive examination.  Review of Systems:  N/A Cardiac Risk Factors include: advanced age (>51men, >32 women)     Objective:     Vitals: BP 112/68 mmHg  Pulse 70  Temp(Src) 98 F (36.7 C)  Ht 5\' 3"  (1.6 m)  Wt 136 lb 8 oz (61.916 kg)  BMI 24.19 kg/m2  SpO2 98%  LMP 12/03/1988  Body mass index is 24.19 kg/(m^2).   Tobacco History  Smoking status  . Never Smoker   Smokeless tobacco  . Never Used     Counseling given: No   Past Medical History  Diagnosis Date  . Allergy   . Anxiety   . GERD (gastroesophageal reflux disease)   . Osteoporosis   . Diverticulosis of colon   . Vertigo   . Hypothyroid   . Breast cancer ([place]) 7/12    Right  . Skin cancer     basal and squamous cell  . Cellulitis   . Hx of radiation therapy 06/20/11 - 07/11/11    right breast  . PONV (postoperative nausea and vomiting)    Past Surgical History  Procedure Laterality Date  . Breast biopsy    . Knee surgery  1962  . Appendectomy  07/2007  . Breast lumpectomy Right 05/16/2011  . Dilation and curettage of uterus N/A 05/31/2014    Procedure: DILATATION AND CURETTAGE WITH ULTRASOUND GUIDANCE;  Surgeon: Cyril Mourning, MD;  Location: [facility];  Service: Gynecology;  Laterality: N/A;   Family History  Problem Relation Age of Onset  . Heart attack Father   . Hypertension Father   . Atrial fibrillation Mother   . Coronary artery disease Mother   . Diabetes      Grandmother  . Coronary artery disease      Grandmother  . Uterine cancer      Grandmother  . Leukemia      Aunt  . Cancer Maternal Aunt     leukemia  . Cancer Maternal Uncle     colon   History  Sexual Activity  . Sexual Activity: No    Outpatient Encounter Prescriptions as of 05/11/2016  Medication Sig  . alendronate (FOSAMAX) 70 MG tablet ONE TABLET BY MOUTH EVERY 7 DAYS TAKE WITH FULL GLASS OF WATER DO NOTLIE  DOWNFOR THE NEXT 30 MIN  . ALPRAZolam (XANAX) 0.25 MG tablet TAKE ONE TABLET BY MOUTH EVERY DAY AS NEEDED FOR ANXIETY OR SLEEP  . aspirin 81 MG tablet Take 81 mg by mouth daily.   . chlorthalidone (HYGROTON) 50 MG tablet Take 1 tablet (50 mg total) by mouth daily as needed. For swelling.  . Cholecalciferol (VITAMIN D) 2000 UNITS CAPS Take 1 capsule by mouth daily.  . diclofenac sodium (VOLTAREN) 1 % GEL Apply 2 g topically 4 (four) times daily. Rub into affected area of foot 2 to 4 times daily (Patient taking differently: Apply 2 g topically 4 (four) times daily. Rub into affected area of foot 2 to 4 times daily as needed)  . dicyclomine (BENTYL) 10 MG capsule Take 1 capsule (10 mg total) by mouth 2 (two) times daily as needed.  . fluticasone (FLONASE) 50 MCG/ACT nasal spray Place 2 sprays into both nostrils daily.  Marland Kitchen letrozole (FEMARA) 2.5 MG tablet Take 1 tablet (2.5 mg total) by mouth daily.  Marland Kitchen levothyroxine (SYNTHROID, LEVOTHROID) 112 MCG tablet TAKE ONE TABLET BY MOUTH EVERY DAY  .  Multiple Vitamin (MULTIVITAMIN) capsule Take 1 capsule by mouth daily.    . niacin 500 MG tablet Take 1,000 mg by mouth at bedtime.   . ranitidine (ZANTAC) 150 MG tablet Take 150 mg by mouth as needed.     No facility-administered encounter medications on file as of 05/11/2016.    Activities of Daily Living In your present state of health, do you have any difficulty performing the following activities: 05/11/2016  Hearing? Y  Vision? N  Difficulty concentrating or making decisions? N  Walking or climbing stairs? N  Dressing or bathing? N  Doing errands, shopping? N  Preparing Food and eating ? N  Using the Toilet? N  In the past six months, have you accidently leaked urine? N  Do you have problems with loss of bowel control? N  Managing your Medications? N  Managing your Finances? N  Housekeeping or managing your Housekeeping? N    Patient Care Team: Abner Greenspan, MD as PCP - General Abner Greenspan,  MD Thea Silversmith, MD as Consulting Physician (Radiation Oncology) Neldon Mc, MD as Surgeon (General Surgery) Thea Silversmith, MD as Consulting Physician (Radiation Oncology) Chauncey Cruel, MD as Consulting Physician (Oncology) Dian Queen, MD as Consulting Physician (Obstetrics and Gynecology) Eula Flax, OD as Referring Physician (Ophthalmology)    Assessment:     Hearing Screening   125Hz  250Hz  500Hz  1000Hz  2000Hz  4000Hz  8000Hz   Right ear:   40 40 40 0   Left ear:   0 0 0 0   Comments: Pt is deaf in left ear  Vision Screening Comments: Last eye exam in Mar 2017 with Dr. Glennon Mac   Exercise Activities and Dietary recommendations Current Exercise Habits: Home exercise routine, Type of exercise: walking;Other - see comments (aerobics), Time (Minutes): 20, Frequency (Times/Week): 7, Weekly Exercise (Minutes/Week): 140, Intensity: Mild, Exercise limited by: None identified  Goals    . Increase physical activity     Starting 05/11/2016, I will continue to exercise at least 15-20 min daily.       Fall Risk Fall Risk  05/11/2016 05/13/2015 05/11/2014 05/07/2013  Falls in the past year? No No No No   Depression Screen PHQ 2/9 Scores 05/11/2016 05/13/2015 05/11/2014 05/07/2013  PHQ - 2 Score 0 0 0 1     Cognitive Testing MMSE - Mini Mental State Exam 05/11/2016  Orientation to time 5  Orientation to Place 5  Registration 3  Attention/ Calculation 0  Recall 3  Language- name 2 objects 0  Language- repeat 1  Language- follow 3 step command 3  Language- read & follow direction 0  Write a sentence 0  Copy design 0  Total score 20   PLEASE NOTE: A Mini-Cog screen was completed. Maximum score is 20. A value of 0 denotes this part of [place] MMSE was not completed or the patient failed this part of the Mini-Cog screening.   Mini-Cog Screening Orientation to Time - Max 5 pts Orientation to Place - Max 5 pts Registration - Max 3 pts Recall - Max 3 pts Language Repeat - Max  1 pts Language Follow 3 Step Command - Max 3 pts   Immunization History  Administered Date(s) Administered  . Influenza Split 09/17/2011  . Influenza Whole 10/17/2007, 09/27/2008, 09/23/2009  . Influenza, High Dose Seasonal PF 09/19/2015  . Influenza-Unspecified 09/01/2013, 10/19/2014  . Pneumococcal Conjugate-13 05/13/2015  . Pneumococcal Polysaccharide-23 12/16/2006, 05/11/2014  . Td 12/03/2005  . Zoster 12/03/2007   Screening Tests Health Maintenance  Topic Date Due  . MAMMOGRAM  09/02/2016 (Originally 11/30/2015)  . TETANUS/TDAP  05/11/2017 (Originally 12/04/2015)  . INFLUENZA VACCINE  07/03/2016  . PAP SMEAR  05/12/2017  . COLONOSCOPY  07/14/2023  . DEXA SCAN  Completed  . ZOSTAVAX  Completed  . PNA vac Low Risk Adult  Completed      Plan:     I have personally reviewed and addressed the Medicare Annual Wellness questionnaire and have noted the following in the patient's chart:  A. Medical and social history B. Use of alcohol, tobacco or illicit drugs  C. Current medications and supplements D. Functional ability and status E.  Nutritional status F.  Physical activity G. Advance directives H. List of other physicians I.  Hospitalizations, surgeries, and ER visits in previous 12 months J.  [place] to include hearing, vision, cognitive, depression L. Referrals and appointments - none  In addition, I have reviewed and discussed with patient certain preventive protocols, quality metrics, and best practice recommendations. A written personalized care plan for preventive services as well as general preventive health recommendations were provided to patient.  See attached scanned questionnaire for additional information.   Signed,   Lindell Noe, MHA, BS, LPN Health Advisor

## 2016-05-11 NOTE — Progress Notes (Signed)
PCP notes:   Health maintenance:  Mammogram - pt will schedule after 05/30/16 Tetanus - postponed/insurance  Abnormal screenings:   Hearing - failed (right ear)  Patient concerns: None  Nurse concerns: None  Next PCP appt: 05/16/16@ 1030   I reviewed health advisor's note, was available for consultation, and agree with documentation and plan.

## 2016-05-11 NOTE — Progress Notes (Signed)
Pre visit review using our clinic review tool, if applicable. No additional management support is needed unless otherwise documented below in the visit note. 

## 2016-05-11 NOTE — Patient Instructions (Signed)
Gabrielle Webb , Thank you for taking time to come for your Medicare Wellness Visit. I appreciate your ongoing commitment to your health goals. Please review the following plan we discussed and let me know if I can assist you in the future.   These are the goals we discussed: Goals    . Increase physical activity     Starting 05/11/2016, I will continue to exercise at least 15-20 min daily.        This is a list of the screening recommended for you and due dates:  Health Maintenance  Topic Date Due  . Mammogram  09/02/2016*  . Tetanus Vaccine  05/11/2017*  . Flu Shot  07/03/2016  . Pap Smear  05/12/2017  . Colon Cancer Screening  07/14/2023  . DEXA scan (bone density measurement)  Completed  . Shingles Vaccine  Completed  . Pneumonia vaccines  Completed  *Topic was postponed. The date shown is not the original due date.    Preventive Care for Adults  A healthy lifestyle and preventive care can promote health and wellness. Preventive health guidelines for adults include the following key practices.  . A routine yearly physical is a good way to check with your health care provider about your health and preventive screening. It is a chance to share any concerns and updates on your health and to receive a thorough exam.  . Visit your dentist for a routine exam and preventive care every 6 months. Brush your teeth twice a day and floss once a day. Good oral hygiene prevents tooth decay and gum disease.  . The frequency of eye exams is based on your age, health, family medical history, use  of contact lenses, and other factors. Follow your health care provider's ecommendations for frequency of eye exams.  . Eat a healthy diet. Foods like vegetables, fruits, whole grains, low-fat dairy products, and lean protein foods contain the nutrients you need without too many calories. Decrease your intake of foods high in solid fats, added sugars, and salt. Eat the right amount of calories for you. Get  information about a proper diet from your health care provider, if necessary.  . Regular physical exercise is one of the most important things you can do for your health. Most adults should get at least 150 minutes of moderate-intensity exercise (any activity that increases your heart rate and causes you to sweat) each week. In addition, most adults need muscle-strengthening exercises on 2 or more days a week.  Silver Sneakers may be a benefit available to you. To determine eligibility, you may visit the website: www.silversneakers.com or contact program at 740-717-1800 Mon-Fri between 8AM-8PM.   . Maintain a healthy weight. The body mass index (BMI) is a screening tool to identify possible weight problems. It provides an estimate of body fat based on height and weight. Your health care provider can find your BMI and can help you achieve or maintain a healthy weight.   For adults 20 years and older: ? A BMI below 18.5 is considered underweight. ? A BMI of 18.5 to 24.9 is normal. ? A BMI of 25 to 29.9 is considered overweight. ? A BMI of 30 and above is considered obese.   . Maintain normal blood lipids and cholesterol levels by exercising and minimizing your intake of saturated fat. Eat a balanced diet with plenty of fruit and vegetables. Blood tests for lipids and cholesterol should begin at age 33 and be repeated every 5 years. If your lipid or  cholesterol levels are high, you are over 50, or you are at high risk for heart disease, you may need your cholesterol levels checked more frequently. Ongoing high lipid and cholesterol levels should be treated with medicines if diet and exercise are not working.  . If you smoke, find out from your health care provider how to quit. If you do not use tobacco, please do not start.  . If you choose to drink alcohol, please do not consume more than 2 drinks per day. One drink is considered to be 12 ounces (355 mL) of beer, 5 ounces (148 mL) of wine, or 1.5  ounces (44 mL) of liquor.  . If you are 3-68 years old, ask your health care provider if you should take aspirin to prevent strokes.  . Use sunscreen. Apply sunscreen liberally and repeatedly throughout the day. You should seek shade when your shadow is shorter than you. Protect yourself by wearing long sleeves, pants, a wide-brimmed hat, and sunglasses year round, whenever you are outdoors.  . Once a month, do a whole body skin exam, using a mirror to look at the skin on your back. Tell your health care provider of new moles, moles that have irregular borders, moles that are larger than a pencil eraser, or moles that have changed in shape or color.

## 2016-05-15 ENCOUNTER — Encounter: Payer: PPO | Admitting: Family Medicine

## 2016-05-16 ENCOUNTER — Ambulatory Visit (INDEPENDENT_AMBULATORY_CARE_PROVIDER_SITE_OTHER): Payer: PPO | Admitting: Family Medicine

## 2016-05-16 ENCOUNTER — Encounter: Payer: Self-pay | Admitting: Family Medicine

## 2016-05-16 VITALS — BP 134/78 | HR 74 | Temp 98.1°F | Ht 63.0 in | Wt 136.5 lb

## 2016-05-16 DIAGNOSIS — Z853 Personal history of malignant neoplasm of breast: Secondary | ICD-10-CM

## 2016-05-16 DIAGNOSIS — E7800 Pure hypercholesterolemia, unspecified: Secondary | ICD-10-CM

## 2016-05-16 DIAGNOSIS — Z0000 Encounter for general adult medical examination without abnormal findings: Secondary | ICD-10-CM

## 2016-05-16 DIAGNOSIS — E039 Hypothyroidism, unspecified: Secondary | ICD-10-CM | POA: Diagnosis not present

## 2016-05-16 DIAGNOSIS — M810 Age-related osteoporosis without current pathological fracture: Secondary | ICD-10-CM | POA: Diagnosis not present

## 2016-05-16 DIAGNOSIS — Z1211 Encounter for screening for malignant neoplasm of colon: Secondary | ICD-10-CM

## 2016-05-16 MED ORDER — ALENDRONATE SODIUM 70 MG PO TABS: ORAL_TABLET | ORAL | Status: DC

## 2016-05-16 MED ORDER — LEVOTHYROXINE SODIUM 100 MCG PO TABS: 100.0000 ug | ORAL_TABLET | Freq: Every day | ORAL | Status: DC

## 2016-05-16 NOTE — Progress Notes (Signed)
Pre visit review using our clinic review tool, if applicable. No additional management support is needed unless otherwise documented below in the visit note. 

## 2016-05-16 NOTE — Progress Notes (Signed)
Subjective:    Patient ID: Gabrielle Webb, female    DOB: [birth year], 75 y.o.   MRN: [id number]  HPI Here for health maintenance exam and to review chronic medical problems    Has been very nervous lately  Feels alone  Has had a lot of loss  Dealing with her elderly mother - is difficult  Has not been able to exercise as much due to plantar fasciitis (that is getting better) Does not feel as safe in her neighborhood - will go back to the [employer] stress as well  Of note - she is active and happy in church and socializes    Pt had her AMW with Lesia on 05/11/16 Deaf in there L ear - and she does fine with it (years)   Mammogram 6/16 neg (bilat dx mam rec in 1 y)-has that scheduled Pt has R breast cancer in 2012-- on femara  Self exam -no lumps or changes    Tetanus shot 1/07- due for that - she will get it at a pharmacy or the health dept    Pap 6/16 negative She had d and c with Dr Helane Rima in 6/15 No vaginal bleeding   Colonoscopy 8/14- adenomatous polyp- 5 year recall  IFOB 6/16   utd imms   dexa 12/16- OP /ordered by Dr Jana Hakim On fosamax -no problems  No falls and no fractures  boniva in the past  Was on tamoxifen for breast cancer -now on femara (no side effects)   Hypothyroidism  Pt has no clinical changes No change in energy level/ hair or skin/ edema and no tremor Lab Results  Component Value Date   TSH 0.32* 05/11/2016    She does feel more nervous and also faster HR   Hyperlipidemia Lab Results  Component Value Date   CHOL 221* 05/11/2016   CHOL 174 05/06/2015   CHOL 185 05/04/2014   Lab Results  Component Value Date   HDL 64.30 05/11/2016   HDL 53.30 05/06/2015   HDL 57.20 05/04/2014   Lab Results  Component Value Date   LDLCALC 137* 05/11/2016   LDLCALC 99 05/06/2015   LDLCALC 105* 05/04/2014   Lab Results  Component Value Date   TRIG 102.0 05/11/2016   TRIG 107.0 05/06/2015   TRIG 115.0 05/04/2014   Lab Results    Component Value Date   CHOLHDL 3 05/11/2016   CHOLHDL 3 05/06/2015   CHOLHDL 3 05/04/2014   Lab Results  Component Value Date   LDLDIRECT 142.0 03/19/2011   LDLDIRECT 125.4 12/08/2009   LDLDIRECT 172.0 09/19/2009  has been eating full fat ranch dressing  No fried food except chips  Eats hamburger once per month  Some ice cream - eats at night  No shellfish    Patient Active Problem List   Diagnosis Date Noted  . Routine general medical examination at a health care facility 05/06/2016  . Abdominal pain 06/14/2015  . Nausea and vomiting 06/14/2015  . Breast cancer of upper-outer quadrant of right female breast ([place]) 03/21/2015  . UTI (urinary tract infection) 06/25/2014  . Abnormal Pap smear of cervix 05/18/2014  . History of tamoxifen therapy 05/18/2014  . Encounter for routine gynecological examination 05/06/2013  . Colon cancer screening 05/06/2013  . Encounter for Medicare annual wellness exam 04/28/2013  . Hx of radiation therapy   . Hematuria 09/12/2012  . Breast cancer ([place])   . Gynecological examination 12/25/2011  . Hx of Breast cancer, stage 1, Right, UOQ,  Receptor+,Her2- 05/29/2011    Class: Stage 1  . Other screening mammogram 03/21/2011  . Post-menopausal 03/21/2011  . Hypothyroidism 01/23/2008  . Pure hypercholesterolemia 01/23/2008  . ANXIETY 01/23/2008  . ALLERGIC RHINITIS 01/23/2008  . GERD 01/23/2008  . DIVERTICULOSIS, COLON 01/23/2008  . IBS 01/23/2008  . FIBROCYSTIC BREAST DISEASE 01/23/2008  . Osteoporosis 10/17/2007   Past Medical History  Diagnosis Date  . Allergy   . Anxiety   . GERD (gastroesophageal reflux disease)   . Osteoporosis   . Diverticulosis of colon   . Vertigo   . Hypothyroid   . Breast cancer ([place]) 7/12    Right  . Skin cancer     basal and squamous cell  . Cellulitis   . Hx of radiation therapy 06/20/11 - 07/11/11    right breast  . PONV (postoperative nausea and vomiting)    Past Surgical History  Procedure  Laterality Date  . Breast biopsy    . Knee surgery  1962  . Appendectomy  07/2007  . Breast lumpectomy Right 05/16/2011  . Dilation and curettage of uterus N/A 05/31/2014    Procedure: DILATATION AND CURETTAGE WITH ULTRASOUND GUIDANCE;  Surgeon: Cyril Mourning, MD;  Location: [facility];  Service: Gynecology;  Laterality: N/A;   Social History  Substance Use Topics  . Smoking status: Never Smoker   . Smokeless tobacco: Never Used  . Alcohol Use: No   Family History  Problem Relation Age of Onset  . Heart attack Father   . Hypertension Father   . Atrial fibrillation Mother   . Coronary artery disease Mother   . Diabetes      Grandmother  . Coronary artery disease      Grandmother  . Uterine cancer      Grandmother  . Leukemia      Aunt  . Cancer Maternal Aunt     leukemia  . Cancer Maternal Uncle     colon   Allergies  Allergen Reactions  . Codeine Nausea And Vomiting  . Morphine Nausea And Vomiting  . Prednisone Palpitations    Rapid hear beat   Current Outpatient Prescriptions on File Prior to Visit  Medication Sig Dispense Refill  . ALPRAZolam (XANAX) 0.25 MG tablet TAKE ONE TABLET BY MOUTH EVERY DAY AS NEEDED FOR ANXIETY OR SLEEP 30 tablet 1  . aspirin 81 MG tablet Take 81 mg by mouth daily.     . chlorthalidone (HYGROTON) 50 MG tablet Take 1 tablet (50 mg total) by mouth daily as needed. For swelling. 90 tablet 1  . Cholecalciferol (VITAMIN D) 2000 UNITS CAPS Take 1 capsule by mouth daily.    . diclofenac sodium (VOLTAREN) 1 % GEL Apply 2 g topically 4 (four) times daily. Rub into affected area of foot 2 to 4 times daily (Patient taking differently: Apply 2 g topically 4 (four) times daily. Rub into affected area of foot 2 to 4 times daily as needed) 100 g 2  . dicyclomine (BENTYL) 10 MG capsule Take 1 capsule (10 mg total) by mouth 2 (two) times daily as needed. 180 capsule 1  . fluticasone (FLONASE) 50 MCG/ACT nasal spray Place 2 sprays into both nostrils daily. 16 g  11  . letrozole (FEMARA) 2.5 MG tablet Take 1 tablet (2.5 mg total) by mouth daily. 90 tablet 4  . Multiple Vitamin (MULTIVITAMIN) capsule Take 1 capsule by mouth daily.      . niacin 500 MG tablet Take 1,000 mg by mouth at bedtime.     Marland Kitchen  ranitidine (ZANTAC) 150 MG tablet Take 150 mg by mouth as needed.       No current facility-administered medications on file prior to visit.    Review of Systems Review of Systems  Constitutional: Negative for fever, appetite change, fatigue and unexpected weight change.  Eyes: Negative for pain and visual disturbance.  Respiratory: Negative for cough and shortness of breath.   Cardiovascular: Negative for cp and pos for occ feeling of rapid HR    Gastrointestinal: Negative for nausea, diarrhea and constipation.  Genitourinary: Negative for urgency and frequency.  Skin: Negative for pallor or rash   Neurological: Negative for weakness, light-headedness, numbness and headaches.  Hematological: Negative for adenopathy. Does not bruise/bleed easily.  Psychiatric/Behavioral: Negative for dysphoric mood. The patient has had more anxiety       Objective:   Physical Exam  Constitutional: She appears well-developed and well-nourished. No distress.  Well appearing   HENT:  Head: Normocephalic and atraumatic.  Right Ear: External ear normal.  Left Ear: External ear normal.  Mouth/Throat: Oropharynx is clear and moist.  Eyes: Conjunctivae and EOM are normal. Pupils are equal, round, and reactive to light. No scleral icterus.  Neck: Normal range of motion. Neck supple. No JVD present. Carotid bruit is not present. No thyromegaly present.  Cardiovascular: Normal rate, regular rhythm, normal heart sounds and intact distal pulses.  Exam reveals no gallop.   Pulmonary/Chest: Effort normal and breath sounds normal. No respiratory distress. She has no wheezes. She exhibits no tenderness.  Abdominal: Soft. Bowel sounds are normal. She exhibits no distension, no  abdominal bruit and no mass. There is no tenderness.  Genitourinary: No breast swelling, tenderness, discharge or bleeding.  Breast exam: No mass, nodules, thickening, tenderness, bulging, retraction, inflamation, nipple discharge or skin changes noted.  No axillary or clavicular LA.     Post operative changes noted (baseline)   Musculoskeletal: Normal range of motion. She exhibits no edema or tenderness.  Lymphadenopathy:    She has no cervical adenopathy.  Neurological: She is alert. She has normal reflexes. No cranial nerve deficit. She exhibits normal muscle tone. Coordination normal.  Skin: Skin is warm and dry. No rash noted. No erythema. No pallor.  Solar lentigines and SKs noted  Psychiatric: Her speech is normal and behavior is normal. Thought content normal. Her mood appears anxious.  Somewhat anxious Tearful at times when discussing stressors          Assessment & Plan:   Problem List Items Addressed This Visit      Endocrine   Hypothyroidism - Primary    Hypothyroidism  Pt has exp some palpitations and anxiety  No change in energy level/ hair or skin/ edema and no tremor Lab Results  Component Value Date   TSH 0.32* 05/11/2016     Will decrease levothyroxine to 100 mcg daily and re check TSH in 6 weeks      Relevant Medications   levothyroxine (SYNTHROID, LEVOTHROID) 100 MCG tablet     Musculoskeletal and Integument   Osteoporosis    On fosamax (boniva in the past) Dexa 12/16 Monitored by oncology  Pt has been on tamoifen and then femara- can lower bone density She will disc opt of prolia at her next f/u       Relevant Medications   alendronate (FOSAMAX) 70 MG tablet     Other   Routine general medical examination at a health care facility    Reviewed health habits including diet and exercise and  skin cancer prevention Reviewed appropriate screening tests for age  Also reviewed health mt list, fam hx and immunization status , as well as social and  family history   See HPI Labs reviewed  AMW with [place] reviewed (declines further hearing eval) When you see oncology ask what the best way to screen for endometrial cancer is for you - you had neg endo biopsy and then a neg pap last year  ? Ultrasound or other  Talk also about the option of Prolia for bone density  Decrease your levothyroxine and schedule non fasting labs for 6 weeks  Nix the ice cream- your cholesterol is up a bit  Avoid red meat/ fried foods/ egg yolks/ fatty breakfast meats/ butter, cheese and high fat dairy/ and shellfish  We will keep an eye on that  Take care of yourself- go back to exercise when you can       Pure hypercholesterolemia    Lipids are up a bit Disc goals for lipids and reasons to control them Rev labs with pt Rev low sat fat diet in detail She will cut back on chips and full fat salad dressing Continue to follow      Hx of Breast cancer, stage 1, Right, UOQ, Receptor+,Her2-    Doing well with femara  She will talk to her onc in aug re:  How we should screen for endometrial cancer  She had neg pap a year ago and neg endo bx the year prior      Colon cancer screening    Due for 5 year recall colonoscopy 8/19  Is currently up to date Prior hx of breast cancer

## 2016-05-16 NOTE — Patient Instructions (Signed)
When you see oncology ask what the best way to screen for endometrial cancer is for you - you had neg endo biopsy and then a neg pap last year  ? Ultrasound or other  Talk also about the option of Prolia for bone density  Decrease your levothyroxine and schedule non fasting labs for 6 weeks  Nix the ice cream- your cholesterol is up a bit  Avoid red meat/ fried foods/ egg yolks/ fatty breakfast meats/ butter, cheese and high fat dairy/ and shellfish  We will keep an eye on that  Take care of yourself- go back to exercise when you can

## 2016-05-19 NOTE — Assessment & Plan Note (Signed)
On fosamax (boniva in the past) Dexa 12/16 Monitored by oncology  Pt has been on tamoifen and then femara- can lower bone density She will disc opt of prolia at her next f/u

## 2016-05-19 NOTE — Assessment & Plan Note (Signed)
Reviewed health habits including diet and exercise and skin cancer prevention Reviewed appropriate screening tests for age  Also reviewed health mt list, fam hx and immunization status , as well as social and family history   See HPI Labs reviewed  AMW with [place] reviewed (declines further hearing eval) When you see oncology ask what the best way to screen for endometrial cancer is for you - you had neg endo biopsy and then a neg pap last year  ? Ultrasound or other  Talk also about the option of Prolia for bone density  Decrease your levothyroxine and schedule non fasting labs for 6 weeks  Nix the ice cream- your cholesterol is up a bit  Avoid red meat/ fried foods/ egg yolks/ fatty breakfast meats/ butter, cheese and high fat dairy/ and shellfish  We will keep an eye on that  Take care of yourself- go back to exercise when you can

## 2016-05-19 NOTE — Assessment & Plan Note (Signed)
Due for 5 year recall colonoscopy 8/19  Is currently up to date Prior hx of breast cancer

## 2016-05-19 NOTE — Assessment & Plan Note (Signed)
Hypothyroidism  Pt has exp some palpitations and anxiety  No change in energy level/ hair or skin/ edema and no tremor Lab Results  Component Value Date   TSH 0.32* 05/11/2016     Will decrease levothyroxine to 100 mcg daily and re check TSH in 6 weeks

## 2016-05-19 NOTE — Assessment & Plan Note (Signed)
Lipids are up a bit Disc goals for lipids and reasons to control them Rev labs with pt Rev low sat fat diet in detail She will cut back on chips and full fat salad dressing Continue to follow

## 2016-05-19 NOTE — Assessment & Plan Note (Signed)
Doing well with femara  She will talk to her onc in aug re:  How we should screen for endometrial cancer  She had neg pap a year ago and neg endo bx the year prior

## 2016-05-23 ENCOUNTER — Encounter: Payer: PPO | Admitting: Family Medicine

## 2016-05-30 DIAGNOSIS — R51 Headache: Secondary | ICD-10-CM | POA: Diagnosis not present

## 2016-05-30 DIAGNOSIS — J301 Allergic rhinitis due to pollen: Secondary | ICD-10-CM | POA: Diagnosis not present

## 2016-06-06 ENCOUNTER — Ambulatory Visit
Admission: RE | Admit: 2016-06-06 | Discharge: 2016-06-06 | Disposition: A | Discharge status: Home / Self Care | Payer: PPO | Source: Ambulatory Visit | Attending: Oncology | Admitting: Oncology

## 2016-06-06 DIAGNOSIS — Z853 Personal history of malignant neoplasm of breast: Secondary | ICD-10-CM

## 2016-06-07 ENCOUNTER — Other Ambulatory Visit: Payer: Self-pay | Admitting: Oncology

## 2016-06-07 DIAGNOSIS — C50019 Malignant neoplasm of nipple and areola, unspecified female breast: Secondary | ICD-10-CM

## 2016-06-22 ENCOUNTER — Ambulatory Visit
Admission: RE | Admit: 2016-06-22 | Discharge: 2016-06-22 | Disposition: A | Discharge status: Home / Self Care | Payer: PPO | Source: Ambulatory Visit | Attending: Oncology | Admitting: Oncology

## 2016-06-22 DIAGNOSIS — R928 Other abnormal and inconclusive findings on diagnostic imaging of breast: Secondary | ICD-10-CM | POA: Diagnosis not present

## 2016-06-27 ENCOUNTER — Other Ambulatory Visit (INDEPENDENT_AMBULATORY_CARE_PROVIDER_SITE_OTHER): Payer: PPO

## 2016-06-27 DIAGNOSIS — E039 Hypothyroidism, unspecified: Secondary | ICD-10-CM | POA: Diagnosis not present

## 2016-06-27 LAB — TSH: TSH: 0.48 u[IU]/mL (ref 0.35–4.50)

## 2016-07-03 ENCOUNTER — Other Ambulatory Visit (HOSPITAL_BASED_OUTPATIENT_CLINIC_OR_DEPARTMENT_OTHER): Payer: PPO

## 2016-07-03 DIAGNOSIS — M810 Age-related osteoporosis without current pathological fracture: Secondary | ICD-10-CM

## 2016-07-03 DIAGNOSIS — C50411 Malignant neoplasm of upper-outer quadrant of right female breast: Secondary | ICD-10-CM | POA: Diagnosis not present

## 2016-07-03 DIAGNOSIS — C50019 Malignant neoplasm of nipple and areola, unspecified female breast: Secondary | ICD-10-CM

## 2016-07-03 LAB — CBC WITH DIFFERENTIAL/PLATELET
BASO%: 0.3 % (ref 0.0–2.0)
BASOS ABS: 0 10*3/uL (ref 0.0–0.1)
EOS ABS: 0.1 10*3/uL (ref 0.0–0.5)
EOS%: 1.6 % (ref 0.0–7.0)
HCT: 38.9 % (ref 34.8–46.6)
HGB: 13.1 g/dL (ref 11.6–15.9)
LYMPH%: 36.6 % (ref 14.0–49.7)
MCH: 29.9 pg (ref 25.1–34.0)
MCHC: 33.8 g/dL (ref 31.5–36.0)
MCV: 88.5 fL (ref 79.5–101.0)
MONO#: 0.5 10*3/uL (ref 0.1–0.9)
MONO%: 9.2 % (ref 0.0–14.0)
NEUT%: 52.3 % (ref 38.4–76.8)
NEUTROS ABS: 2.9 10*3/uL (ref 1.5–6.5)
PLATELETS: 172 10*3/uL (ref 145–400)
RBC: 4.4 10*6/uL (ref 3.70–5.45)
RDW: 13.8 % (ref 11.2–14.5)
WBC: 5.6 10*3/uL (ref 3.9–10.3)
lymph#: 2 10*3/uL (ref 0.9–3.3)

## 2016-07-03 LAB — COMPREHENSIVE METABOLIC PANEL
ALK PHOS: 55 U/L (ref 40–150)
ALT: 12 U/L (ref 0–55)
ANION GAP: 6 meq/L (ref 3–11)
AST: 19 U/L (ref 5–34)
Albumin: 3.8 g/dL (ref 3.5–5.0)
BILIRUBIN TOTAL: 0.57 mg/dL (ref 0.20–1.20)
BUN: 11.8 mg/dL (ref 7.0–26.0)
CO2: 27 meq/L (ref 22–29)
Calcium: 9.4 mg/dL (ref 8.4–10.4)
Chloride: 108 mEq/L (ref 98–109)
Creatinine: 0.7 mg/dL (ref 0.6–1.1)
EGFR: 82 mL/min/{1.73_m2} — AB (ref >90)
Glucose: 86 mg/dl (ref 70–140)
POTASSIUM: 4.1 meq/L (ref 3.5–5.1)
Sodium: 142 mEq/L (ref 136–145)
TOTAL PROTEIN: 6.7 g/dL (ref 6.4–8.3)

## 2016-07-04 LAB — VITAMIN D 25 HYDROXY (VIT D DEFICIENCY, FRACTURES): VIT D 25 HYDROXY: 40.2 ng/mL (ref 30.0–100.0)

## 2016-07-10 ENCOUNTER — Telehealth: Payer: Self-pay | Admitting: Oncology

## 2016-07-10 ENCOUNTER — Ambulatory Visit (HOSPITAL_BASED_OUTPATIENT_CLINIC_OR_DEPARTMENT_OTHER): Payer: PPO | Admitting: Oncology

## 2016-07-10 VITALS — BP 133/77 | HR 99 | Temp 98.4°F | Resp 18 | Ht 63.0 in | Wt 137.1 lb

## 2016-07-10 DIAGNOSIS — C50411 Malignant neoplasm of upper-outer quadrant of right female breast: Secondary | ICD-10-CM | POA: Diagnosis not present

## 2016-07-10 DIAGNOSIS — M810 Age-related osteoporosis without current pathological fracture: Secondary | ICD-10-CM

## 2016-07-10 DIAGNOSIS — Z79811 Long term (current) use of aromatase inhibitors: Secondary | ICD-10-CM

## 2016-07-10 DIAGNOSIS — N950 Postmenopausal bleeding: Secondary | ICD-10-CM

## 2016-07-10 DIAGNOSIS — Z170 Estrogen receptor positive status [ER+]: Secondary | ICD-10-CM | POA: Diagnosis not present

## 2016-07-10 NOTE — Progress Notes (Signed)
[facility]  [phone] [phone]     ID: Gabrielle Webb DOB: 1941-08-09  MR#: [id number]  EUM#:[id number]  Patient Care Team: Abner Greenspan, MD as PCP - General Abner Greenspan, MD Thea Silversmith, MD as Consulting Physician (Radiation Oncology) Neldon Mc, MD as Surgeon (General Surgery) Thea Silversmith, MD as Consulting Physician (Radiation Oncology) Chauncey Cruel, MD as Consulting Physician (Oncology) Dian Queen, MD as Consulting Physician (Obstetrics and Gynecology) Eula Flax, OD as Referring Physician (Ophthalmology) Clyde Canterbury, MD as Referring Physician (Otolaryngology) PCP: Loura Pardon, MD OTHER MD: Deitra Mayo MD  CHIEF COMPLAINT: Estrogen receptor positive breast cancer  CURRENT TREATMENT: Completing 5 years of anti-estrogen therapy   BREAST CANCER HISTORY: From Dr. Eusebio Me initial intake note 05/10/2011:  "Gabrielle Webb is a pleasant 75 year old female.  She underwent a screening mammogram on 04/20/2011 and was found to have an asymmetric spiculated mass in the upper outer quadrant.  Ultrasound confirmed the presence of this mass, and a biopsy on 05/02/2011 revealed a low-grade invasive ductal carcinoma.  This measured 11 mm and was ER, PR positive, HER2 negative and Ki-67 was 15%.  An MRI of the bilateral breasts was performed on 05/07/2011.  This showed a 1.5 x 1.0 cm mass.  Gabrielle Webb notes a single episode of possible stinging in her right breast prior to her mammogram.  She has a long history of fibrocystic disease though, so she did not feel anything was greatly out of the ordinary.  Interestingly, she did have a history of several breast biopsies and excisions in 1987 and 1988.  She brings these old clinic records with her, and it looks like some fibrocystic disease was removed as well as an area of atypical cells.  The differential is quite broad, but it is possible given that she has developed this  in a very similar area that this was LCIS.  Regardless, at that time when she had those biopsies, she had been on hormone replacement therapy for about a month and took herself off."  Her subsequent history is as detailed below  INTERVAL HISTORY: Gabrielle Webb returns today for follow-up of her breast cancer. She is currently on letrozole, which she tolerates well. Hot flashes and vaginal dryness are not a major issue. She never developed the arthralgias or myalgias that many patients can experience on this medication. She obtains it at a good price.   REVIEW OF SYSTEMS: Gabrielle Webb is an only child, and her mother is 23 years old, requiring quite a bit of attention. Recently she required surgery, was then in a rehabilitation facility, and now is in an independent living area in the senior citizens facility. This is probably the best decision that could be made but it continues to be very stressful for Gabrielle Webb had in particular she has not been able to exercise regularly. Despite this a detailed review of systems today was noncontributory  PAST MEDICAL HISTORY: Past Medical History:  Diagnosis Date  . Allergy   . Anxiety   . Breast cancer ([place]) 7/12   Right  . Cellulitis   . Diverticulosis of colon   . GERD (gastroesophageal reflux disease)   . Hx of radiation therapy 06/20/11 - 07/11/11   right breast  . Hypothyroid   . Osteoporosis   . PONV (postoperative nausea and vomiting)   . Skin cancer    basal and squamous cell  . Vertigo     PAST SURGICAL HISTORY: Past Surgical History:  Procedure Laterality Date  .  APPENDECTOMY  07/2007  . BREAST BIOPSY    . BREAST LUMPECTOMY Right 05/16/2011  . DILATION AND CURETTAGE OF UTERUS N/A 05/31/2014   Procedure: DILATATION AND CURETTAGE WITH ULTRASOUND GUIDANCE;  Surgeon: Cyril Mourning, MD;  Location: [facility];  Service: Gynecology;  Laterality: N/A;  . KNEE SURGERY  1962    FAMILY HISTORY Family History  Problem Relation Age of Onset  . Heart  attack Father   . Hypertension Father   . Atrial fibrillation Mother   . Coronary artery disease Mother   . Diabetes      Grandmother  . Coronary artery disease      Grandmother  . Uterine cancer      Grandmother  . Leukemia      Aunt  . Cancer Maternal Aunt     leukemia  . Cancer Maternal Uncle     colon   the patient's father died at the age of 57 from a myocardial infarction. The patient's mother is 53 years old currently. She has atrial fibrillation. [facility] at [facility]. The patient is an only child. There is no history of breast or ovarian cancer in the family to her knowledge  GYNECOLOGIC HISTORY:  Patient's last menstrual period was 12/03/1988. Menarche age 57 first live birth age 27. She stopped having periods in her late 36s. 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 Her husband died from widely metastatic cancer of unknown primary (it was never biopsied) approximately a year ago. She lives alone in a [place], with 2 cats. Her son Gabrielle Webb "Gabrielle Webb "Careers Webb lives in [place]. He works out of his home for [employer]. The patient has 3 grandchildren, the oldest 64, 53, his micro-cephalic, the others are 37 and 73 years old. The patient attends a local church in [place]: [employer]. The patient's son Gabrielle Webb is her healthcare part of attorney. He can be reached at [phone].   HEALTH MAINTENANCE: Social History  Substance Use Topics  . Smoking status: Never Smoker  . Smokeless tobacco: Never Used  . Alcohol use No     Colonoscopy:  PAP:  Bone density:  Lipid panel:  Allergies  Allergen Reactions  . Codeine Nausea And Vomiting  . Morphine Nausea And Vomiting  . Prednisone Palpitations    Rapid hear beat    Current Outpatient Prescriptions  Medication Sig Dispense Refill  . alendronate (FOSAMAX) 70 MG tablet ONE TABLET BY  MOUTH EVERY 7 DAYS TAKE WITH FULL GLASS OF WATER DO NOTLIE DOWNFOR THE NEXT 30 MIN 12 tablet 3  . ALPRAZolam (XANAX) 0.25 MG tablet TAKE ONE TABLET BY MOUTH EVERY DAY AS NEEDED FOR ANXIETY OR SLEEP 30 tablet 1  . aspirin 81 MG tablet Take 81 mg by mouth daily.     . chlorthalidone (HYGROTON) 50 MG tablet Take 1 tablet (50 mg total) by mouth daily as needed. For swelling. 90 tablet 1  . Cholecalciferol (VITAMIN D) 2000 UNITS CAPS Take 1 capsule by mouth daily.    . diclofenac sodium (VOLTAREN) 1 % GEL Apply 2 g topically 4 (four) times daily. Rub into affected area of foot 2 to 4 times daily (Patient taking differently: Apply 2 g topically 4 (four) times daily. Rub into affected area of foot 2 to 4 times daily as needed) 100 g 2  . dicyclomine (BENTYL) 10 MG capsule Take  1 capsule (10 mg total) by mouth 2 (two) times daily as needed. 180 capsule 1  . fluticasone (FLONASE) 50 MCG/ACT nasal spray Place 2 sprays into both nostrils daily. 16 g 11  . letrozole (FEMARA) 2.5 MG tablet Take 1 tablet (2.5 mg total) by mouth daily. 90 tablet 4  . levothyroxine (SYNTHROID, LEVOTHROID) 100 MCG tablet Take 1 tablet (100 mcg total) by mouth daily. 30 tablet 3  . Multiple Vitamin (MULTIVITAMIN) capsule Take 1 capsule by mouth daily.      . niacin 500 MG tablet Take 1,000 mg by mouth at bedtime.     . ranitidine (ZANTAC) 150 MG tablet Take 150 mg by mouth as needed.       No current facility-administered medications for this visit.     OBJECTIVE: Middle-aged white womanWho appears well Vitals:   07/10/16 1403  BP: 133/77  Pulse: 99  Resp: 18  Temp: 98.4 F (36.9 C)     Body mass index is 24.29 kg/m.    ECOG FS:0 - Asymptomatic  Sclerae unicteric, pupils round and equal Oropharynx clear and moist-- no thrush or other lesions No cervical or supraclavicular adenopathy Lungs no rales or rhonchi Heart regular rate and rhythm Abd soft, nontender, positive bowel sounds MSK no focal spinal tenderness, no  upper extremity lymphedema Neuro: nonfocal, well oriented, appropriate affect Breasts: The right breast is status post lumpectomy and radiation. There is no evidence of local recurrence. The right axilla is benign. The left breast is unremarkable.  LAB RESULTS:  CMP     Component Value Date/Time   NA 142 07/03/2016 0957   K 4.1 07/03/2016 0957   CL 103 05/11/2016 0813   CL 109 (H) 09/18/2012 0849   CO2 27 07/03/2016 0957   GLUCOSE 86 07/03/2016 0957   GLUCOSE 79 09/18/2012 0849   BUN 11.8 07/03/2016 0957   CREATININE 0.7 07/03/2016 0957   CALCIUM 9.4 07/03/2016 0957   PROT 6.7 07/03/2016 0957   ALBUMIN 3.8 07/03/2016 0957   AST 19 07/03/2016 0957   ALT 12 07/03/2016 0957   ALKPHOS 55 07/03/2016 0957   BILITOT 0.57 07/03/2016 0957   GFRNONAA 84 (L) 05/27/2014 1120   GFRAA >90 05/27/2014 1120    INo results found for: SPEP, UPEP  Lab Results  Component Value Date   WBC 5.6 07/03/2016   NEUTROABS 2.9 07/03/2016   HGB 13.1 07/03/2016   HCT 38.9 07/03/2016   MCV 88.5 07/03/2016   PLT 172 07/03/2016      Chemistry      Component Value Date/Time   NA 142 07/03/2016 0957   K 4.1 07/03/2016 0957   CL 103 05/11/2016 0813   CL 109 (H) 09/18/2012 0849   CO2 27 07/03/2016 0957   BUN 11.8 07/03/2016 0957   CREATININE 0.7 07/03/2016 0957      Component Value Date/Time   CALCIUM 9.4 07/03/2016 0957   ALKPHOS 55 07/03/2016 0957   AST 19 07/03/2016 0957   ALT 12 07/03/2016 0957   BILITOT 0.57 07/03/2016 0957       Lab Results  Component Value Date   LABCA2 17 05/09/2011    No components found for: [id number]  No results for input(s): INR in the last 168 hours.  Urinalysis    Component Value Date/Time   BILIRUBINUR 1+ 06/25/2014 1208   PROTEINUR 30+ 06/25/2014 1208   UROBILINOGEN 0.2 06/25/2014 1208   NITRITE neg. 06/25/2014 1208   LEUKOCYTESUR moderate (2+) 06/25/2014 1208  STUDIES: Mm Diag Breast Tomo Bilateral  Result Date: 06/22/2016 CLINICAL DATA:   HISTORY OF MALIGNANT LUMPECTOMY OF THE RIGHT BREAST IN 2012. ANNUAL RE-EVALUATION. EXAM: 2D DIGITAL DIAGNOSTIC BILATERAL MAMMOGRAM WITH CAD AND ADJUNCT TOMO COMPARISON:  Previous exam(s). ACR Breast Density Category b: There are scattered areas of fibroglandular density. FINDINGS: The breast parenchymal pattern is stable. There mild stable scarring changes located within the lateral right breast related to the patient's lumpectomy. There is no specific evidence for recurrent tumor or developing malignancy within either breast. Mammographic images were processed with CAD. IMPRESSION: Stable breast parenchymal pattern. No findings worrisome for recurrent tumor or developing malignancy. RECOMMENDATION: Bilateral diagnostic mammography in 1 year. I have discussed the findings and recommendations with the patient. Results were also provided in writing at the conclusion of the visit. If applicable, a reminder letter will be sent to the patient regarding the next appointment. BI-RADS CATEGORY  1: Negative. Electronically Signed   By: Altamese Cabal M.D.   On: 06/22/2016 14:41    ASSESSMENT: 75 y.o.  [place] woman status post right breast upper outer quadrant biopsy 05/02/2011 for a low-grade invasive ductal carcinoma which was estrogen receptor 100% positive, progesterone receptor 22% positive, with an MIB-1 of 17% and no HER-2 amplification, the signals ratio being 1.68  ([id number])  2. Status post right lumpectomy and sentinel lymph node sampling 05/16/2011 for a pT1c pN0, stage IA invasive ductal carcinoma, low-grade, with negative margins  3. Oncotype DX score of 13 predicted an 8% risk of recurrence outside the breast within 10 years if the patient's only systemic therapy is tamoxifen for 5 years. He also predicted no benefit from chemotherapy.  3. Adjuvant radiation completed 07/23/2011  4. Tamoxifen started August 2012, discontinued June 2015  5. Letrozole started August 2015, discontinued  August 2017  (a) alendronate started August 2015  (b) bone density at the [facility] 12/01/2015 shows osteoporosis with a T score of -2.5  PLAN: Gabrielle Webb is currently at the crux of multiple decisions and that is what today's visit lasted well and axis of 30 minutes.  She is now 5 years out from definitive surgery for breast cancer with no evidence of recurrence. This is very favorable. She has also completed 5 years of anti-estrogen therapy  The first question was whether to continue the antiestrogen therapy. In her case the benefit is likely to be in the 2% range, plus or -1%, in terms of reduction in recurrence risk. This proved not to be a motivator to her once we discussed the possible complications of continuing including issues regarding bone density. Accordingly she is now stopping the letrozole.  We then discussed whether she should switch to Prolia or Zometa, or continue on Fosamax. After much discussion of all the possible convenience S and complications, she decided to continue on Fosamax.  She wondered how best to screen for endometrial cancer. She did have an episode of postmenopausal bleeding last year, and she had the appropriate studies including biopsy which were negative. I recommended that she have one more pelvic and transvaginal ultrasound and if that is normal no further follow-up is needed unless she has further bleeding episodes. I have gone ahead and entered those orders at her request and I will also asked that her primary care physician, Dr. Glori Bickers, be copied on results.  Finally I gave her the option of "getting out of the cancer business", continuing to see me yearly, or transitioning to our survivorship program, which has several advantages in  that the focus of follow-up is brought her. After all these options were clarified what she would like to do is continue to see me on a once a year basis under observation alone. I am comfortable with that decision.  I will let  her know the results of the ultrasonography as soon as they become available. She will otherwise see me again in a year. She knows to call for any other problems that may develop before that visit.  Chauncey Cruel, MD   07/10/2016 2:06 PM Medical Oncology and Hematology [facility] [address] [place], [place] [address] Tel. [phone]    Fax. [phone]

## 2016-07-10 NOTE — Telephone Encounter (Signed)
appt made and avs printed °

## 2016-07-20 ENCOUNTER — Telehealth: Payer: Self-pay | Admitting: *Deleted

## 2016-07-20 NOTE — Telephone Encounter (Signed)
The worst that could happen would be and inconclusive test- but if her oncologist wants her to get it she probably should

## 2016-07-20 NOTE — Telephone Encounter (Signed)
Pt said she when she saw Dr. Jana Hakim (oncologist) they recommended pt have a [name] due to her having a D&C a few years ago. Pt wanted me to ask Dr. Glori Bickers what she thought about it because before her D&C she tried to have an [name] and Dr. Runell Gess told her at the time her uterus was to thick to visualized anything due to her taking the tamoxifen. Pt wants to know if Dr. Glori Bickers thinks she should have the [name] or does she think it may be a waste due to her thick uterus

## 2016-07-23 ENCOUNTER — Telehealth: Payer: Self-pay | Admitting: *Deleted

## 2016-07-23 NOTE — Telephone Encounter (Signed)
Called pt after discussing with Dr Jana Hakim & informed pt that there wasn't another alternative & best to do U/S.

## 2016-07-23 NOTE — Telephone Encounter (Signed)
Pt notified of Dr. Marliss Coots comments and verbalized understanding. Pt will call oncologist office to get it scheduled

## 2016-07-23 NOTE — Telephone Encounter (Signed)
Received vm call from pt stating that Dr Jana Hakim ordered endometrial [name] but she is concerned b/c last time she had one in 2015, Dr Tressia Danas was unable to do in her office due to thickening of the uterus & had to hav a D&C at [facility].  She doesn't want to go through the same thing that she did then so just wants to discuss with Dr Jana Hakim & be sure this is what she needs to do. Message to Dr Jana Hakim.  Call back # is [phone]

## 2016-07-31 DIAGNOSIS — M7989 Other specified soft tissue disorders: Secondary | ICD-10-CM | POA: Diagnosis not present

## 2016-07-31 DIAGNOSIS — M79641 Pain in right hand: Secondary | ICD-10-CM | POA: Diagnosis not present

## 2016-07-31 DIAGNOSIS — M25531 Pain in right wrist: Secondary | ICD-10-CM | POA: Diagnosis not present

## 2016-07-31 DIAGNOSIS — S8991XA Unspecified injury of right lower leg, initial encounter: Secondary | ICD-10-CM | POA: Diagnosis not present

## 2016-07-31 DIAGNOSIS — M25561 Pain in right knee: Secondary | ICD-10-CM | POA: Diagnosis not present

## 2016-07-31 DIAGNOSIS — R0781 Pleurodynia: Secondary | ICD-10-CM | POA: Diagnosis not present

## 2016-07-31 DIAGNOSIS — S6991XA Unspecified injury of right wrist, hand and finger(s), initial encounter: Secondary | ICD-10-CM | POA: Diagnosis not present

## 2016-07-31 DIAGNOSIS — S299XXA Unspecified injury of thorax, initial encounter: Secondary | ICD-10-CM | POA: Diagnosis not present

## 2016-07-31 DIAGNOSIS — W19XXXA Unspecified fall, initial encounter: Secondary | ICD-10-CM | POA: Diagnosis not present

## 2016-08-21 ENCOUNTER — Ambulatory Visit (HOSPITAL_COMMUNITY)
Admission: RE | Admit: 2016-08-21 | Discharge: 2016-08-21 | Disposition: A | Discharge status: Home / Self Care | Payer: PPO | Source: Ambulatory Visit | Attending: Oncology | Admitting: Oncology

## 2016-08-21 DIAGNOSIS — N858 Other specified noninflammatory disorders of uterus: Secondary | ICD-10-CM | POA: Insufficient documentation

## 2016-08-21 DIAGNOSIS — C50411 Malignant neoplasm of upper-outer quadrant of right female breast: Secondary | ICD-10-CM

## 2016-08-21 DIAGNOSIS — N950 Postmenopausal bleeding: Secondary | ICD-10-CM

## 2016-08-23 ENCOUNTER — Other Ambulatory Visit: Payer: Self-pay | Admitting: Family Medicine

## 2016-08-30 ENCOUNTER — Telehealth: Payer: Self-pay | Admitting: *Deleted

## 2016-08-30 DIAGNOSIS — C50019 Malignant neoplasm of nipple and areola, unspecified female breast: Secondary | ICD-10-CM

## 2016-08-30 DIAGNOSIS — R9389 Abnormal findings on diagnostic imaging of other specified body structures: Secondary | ICD-10-CM

## 2016-08-30 NOTE — Telephone Encounter (Signed)
This RN spoke with pt regarding results of U/S for evaluation of endometrial thickening per follow up post stopping tamoxifen.  Noted decrease in overall thickness of endometrial since prior work up under Dr Monica Becton but due to recommendations by reading radiologist  Dr Jana Hakim recommends pt to follow up with above GYN for further work up or needed studies in the future.  Per discussion with pt- she would like to return to Dr Helane Rima " and if there is any concerns I would just prefer to have surgery "  This note with scan results will be faxed to Dr Christen Butter.

## 2016-09-05 DIAGNOSIS — N882 Stricture and stenosis of cervix uteri: Secondary | ICD-10-CM | POA: Diagnosis not present

## 2016-09-05 DIAGNOSIS — Z853 Personal history of malignant neoplasm of breast: Secondary | ICD-10-CM | POA: Diagnosis not present

## 2016-09-05 DIAGNOSIS — R938 Abnormal findings on diagnostic imaging of other specified body structures: Secondary | ICD-10-CM | POA: Diagnosis not present

## 2016-09-26 ENCOUNTER — Telehealth: Payer: Self-pay | Admitting: Family Medicine

## 2016-09-26 DIAGNOSIS — E039 Hypothyroidism, unspecified: Secondary | ICD-10-CM

## 2016-09-26 NOTE — Telephone Encounter (Signed)
Pt called. Wants TSH labs drawn again. After reducing medication to 100mg , she is not feeling good. Pt does not know if it has something to do with her upcoming surgery and taking care of her 72 yr old mother.   Please advise [phone]

## 2016-09-26 NOTE — Telephone Encounter (Signed)
No problem  I will order TSH future for whenever she wants to come in

## 2016-09-27 ENCOUNTER — Telehealth: Payer: Self-pay | Admitting: Radiology

## 2016-09-27 ENCOUNTER — Other Ambulatory Visit (INDEPENDENT_AMBULATORY_CARE_PROVIDER_SITE_OTHER): Payer: PPO

## 2016-09-27 DIAGNOSIS — E039 Hypothyroidism, unspecified: Secondary | ICD-10-CM

## 2016-09-27 NOTE — Telephone Encounter (Signed)
Lab appt scheduled.

## 2016-09-27 NOTE — Telephone Encounter (Signed)
I am aware. thanks

## 2016-09-27 NOTE — Telephone Encounter (Signed)
Patient wanted to make sure you knew she is having a complete hysterectomy in Dec.

## 2016-10-01 LAB — TSH: TSH: 1.59 u[IU]/mL (ref 0.35–4.50)

## 2016-10-02 ENCOUNTER — Telehealth: Payer: Self-pay

## 2016-10-02 NOTE — Telephone Encounter (Signed)
Because this is not my field - I cannot make a statement regarding need for hysterectomy vs other imaging studies or bx, but I trust her gyn.  If she has apprehensions- would recommend another gyn opinion before going forward.  The gyn is taking into account both the [name] and also other factors like history and symptoms as well.  (more important than just looking at the ultrasound)

## 2016-10-02 NOTE — Telephone Encounter (Signed)
Pt called asking to speak to [place] about something with her hysterectomy. Did not leave any other information, sorry.

## 2016-10-02 NOTE — Telephone Encounter (Signed)
Pt wanted to make sure Dr. Glori Bickers saw her [name] and OB notes and that she agrees with their recommendations for her to have a hysterectomy, pt viewed [name] results on mychart and she said the radiologist on their recommended f/u studies and possible biopsy and OB is recommending the hysterectomy   Pt wants a call from Dr. Glori Bickers tomorrow if possible to discuss this before her surgery

## 2016-10-03 NOTE — Telephone Encounter (Signed)
Pt notified of Dr. Marliss Coots comments and verbalized understanding. Pt declined a 2nd opinion she just wanted to see what Dr. Glori Bickers though and to make sure she was aware of her situation and that she was having surgery. Pt will call back if she has any other questions

## 2016-10-20 NOTE — H&P (Signed)
75 year old G 1 P 1 PMP with history of thickened endometrium. Last measurement 6 mm. She is currently not on tamoxifen but has been in the past.  Previously , we had to do a ultrasound guided D and C for the above reason. She is worried about this and does not want repetitive ultrasounds and repetitive biopsies. She denies vaginal bleeding.  She requests abdominal hysterectomy. She does not want a laparoscopic approach.  Past Medical History:  Diagnosis Date  . Allergy   . Anxiety   . Breast cancer ([place]) 7/12   Right  . Cellulitis   . Diverticulosis of colon   . GERD (gastroesophageal reflux disease)   . Hx of radiation therapy 06/20/11 - 07/11/11   right breast  . Hypothyroid   . Osteoporosis   . PONV (postoperative nausea and vomiting)   . Skin cancer    basal and squamous cell  . Vertigo    Past Surgical History:  Procedure Laterality Date  . APPENDECTOMY  07/2007  . BREAST BIOPSY    . BREAST LUMPECTOMY Right 05/16/2011  . DILATION AND CURETTAGE OF UTERUS N/A 05/31/2014   Procedure: DILATATION AND CURETTAGE WITH ULTRASOUND GUIDANCE;  Surgeon: Cyril Mourning, MD;  Location: [facility];  Service: Gynecology;  Laterality: N/A;  . [place]   Prior to Admission medications   Medication Sig Start Date End Date Taking? Authorizing Provider  alendronate (FOSAMAX) 70 MG tablet ONE TABLET BY MOUTH EVERY 7 DAYS TAKE WITH FULL GLASS OF WATER DO NOTLIE DOWNFOR THE NEXT 30 MIN 05/16/16   Abner Greenspan, MD  ALPRAZolam (XANAX) 0.25 MG tablet TAKE ONE TABLET BY MOUTH EVERY DAY AS NEEDED FOR ANXIETY OR SLEEP 09/09/15   Abner Greenspan, MD  aspirin 81 MG tablet Take 81 mg by mouth daily.     Historical Provider, MD  chlorthalidone (HYGROTON) 50 MG tablet Take 1 tablet (50 mg total) by mouth daily as needed. For swelling. 12/08/14   Abner Greenspan, MD  Cholecalciferol (VITAMIN D) 2000 UNITS CAPS Take 1 capsule by mouth daily.    Historical Provider, MD  diclofenac sodium (VOLTAREN) 1 % GEL Apply 2  g topically 4 (four) times daily. Rub into affected area of foot 2 to 4 times daily Patient taking differently: Apply 2 g topically 4 (four) times daily. Rub into affected area of foot 2 to 4 times daily as needed 12/21/14   Trula Slade, DPM  dicyclomine (BENTYL) 10 MG capsule Take 1 capsule (10 mg total) by mouth 2 (two) times daily as needed. 12/09/14   Abner Greenspan, MD  fluticasone (FLONASE) 50 MCG/ACT nasal spray Place 2 sprays into both nostrils daily. 01/16/16   Owens Loffler, MD  levothyroxine (SYNTHROID, LEVOTHROID) 100 MCG tablet TAKE 1 TABLET BY MOUTH DAILY 08/23/16   Abner Greenspan, MD  Multiple Vitamin (MULTIVITAMIN) capsule Take 1 capsule by mouth daily.      Historical Provider, MD  niacin 500 MG tablet Take 1,000 mg by mouth at bedtime.     Historical Provider, MD  ranitidine (ZANTAC) 150 MG tablet Take 150 mg by mouth as needed.      Historical Provider, MD   Allergies Codeine; Morphine; and Prednisone  Family History  Problem Relation Age of Onset  . Heart attack Father   . Hypertension Father   . Atrial fibrillation Mother   . Coronary artery disease Mother   . Diabetes      Grandmother  .  Coronary artery disease      Grandmother  . Uterine cancer      Grandmother  . Leukemia      Aunt  . Cancer Maternal Aunt     leukemia  . Cancer Maternal Uncle     colon   Social History   Social History  . Marital status: Widowed    Spouse name: N/A  . Number of children: 1  . Years of education: N/A   Occupational History  . insurance agency    Social History Main Topics  . Smoking status: Never Smoker  . Smokeless tobacco: Never Used  . Alcohol use No  . Drug use: No  . Sexual activity: No   Other Topics Concern  . Not on file   Social History Narrative   Exercises on [employer] elliptical   General alert and oriented Lung CTAB Car RRR Abdomen is soft and non tender Pelvic  Uterus is small and deep in the vagina No cystocele or rectocele  noted  IMPRESSION: Thickened endometrium History of breast cancer Cervical stenosis  PLAN: TAH  BSO Risks reviewed Consent signed

## 2016-10-26 NOTE — Patient Instructions (Signed)
Your procedure is scheduled on:  Thursday, Dec. 7, 2017  Enter through the [employer] [facility] at:  6:00 AM  Pick up the phone at the desk and dial [phone].  Call this number if you have problems the morning of surgery: [phone].  Remember: Do NOT eat food or drink after: Midnight Wednesday, Dec. 6, 2017  Take these medicines the morning of surgery with a SIP OF WATER:  Levothyroxine, Ranitidine, Xanax if neeed  Stop ALL herbal medications at this time   Do NOT wear jewelry (body piercing), metal hair clips/bobby pins, make-up, or nail polish. Do NOT wear lotions, powders, or perfumes.  You may wear deodorant. Do NOT shave for 48 hours prior to surgery. Do NOT bring valuables to the hospital. Contacts, dentures, or bridgework may not be worn into surgery.  Leave suitcase in car.  After surgery it may be brought to your room.  For patients admitted to the hospital, checkout time is 11:00 AM the day of discharge.

## 2016-10-29 ENCOUNTER — Encounter (HOSPITAL_COMMUNITY): Payer: Self-pay

## 2016-10-29 ENCOUNTER — Encounter (HOSPITAL_COMMUNITY)
Admission: RE | Admit: 2016-10-29 | Discharge: 2016-10-29 | Disposition: A | Discharge status: Home / Self Care | Payer: PPO | Source: Ambulatory Visit | Attending: Obstetrics and Gynecology | Admitting: Obstetrics and Gynecology

## 2016-10-29 DIAGNOSIS — M4802 Spinal stenosis, cervical region: Secondary | ICD-10-CM | POA: Insufficient documentation

## 2016-10-29 DIAGNOSIS — Z01810 Encounter for preprocedural cardiovascular examination: Secondary | ICD-10-CM | POA: Diagnosis not present

## 2016-10-29 DIAGNOSIS — Z01812 Encounter for preprocedural laboratory examination: Secondary | ICD-10-CM | POA: Insufficient documentation

## 2016-10-29 DIAGNOSIS — Z853 Personal history of malignant neoplasm of breast: Secondary | ICD-10-CM | POA: Diagnosis not present

## 2016-10-29 DIAGNOSIS — N8500 Endometrial hyperplasia, unspecified: Secondary | ICD-10-CM | POA: Diagnosis not present

## 2016-10-29 DIAGNOSIS — R938 Abnormal findings on diagnostic imaging of other specified body structures: Secondary | ICD-10-CM | POA: Insufficient documentation

## 2016-10-29 DIAGNOSIS — N882 Stricture and stenosis of cervix uteri: Secondary | ICD-10-CM | POA: Diagnosis not present

## 2016-10-29 HISTORY — DX: Headache: R51

## 2016-10-29 HISTORY — DX: Headache, unspecified: R51.9

## 2016-10-29 HISTORY — DX: Personal history of other diseases of the digestive system: Z87.19

## 2016-10-29 LAB — CBC
HCT: 41.3 % (ref 36.0–46.0)
Hemoglobin: 14.2 g/dL (ref 12.0–15.0)
MCH: 30.3 pg (ref 26.0–34.0)
MCHC: 34.4 g/dL (ref 30.0–36.0)
MCV: 88.2 fL (ref 78.0–100.0)
PLATELETS: 192 10*3/uL (ref 150–400)
RBC: 4.68 MIL/uL (ref 3.87–5.11)
RDW: 13.5 % (ref 11.5–15.5)
WBC: 9.2 10*3/uL (ref 4.0–10.5)

## 2016-10-29 LAB — TYPE AND SCREEN
ABO/RH(D): A POS
ANTIBODY SCREEN: NEGATIVE

## 2016-10-29 LAB — ABO/RH: ABO/RH(D): A POS

## 2016-11-01 ENCOUNTER — Telehealth: Payer: Self-pay

## 2016-11-01 NOTE — Telephone Encounter (Signed)
Pt left v/m; pt request to pick up DNR form for pt prior to pt having upcoming surgery. Pt would like to pick up on 11/02/16. Pt request cb when ready for pick up.

## 2016-11-02 NOTE — Telephone Encounter (Signed)
Does she need the yellow form that you keep at home ? Or does she need the Most form- can she be more specific?  Thanks

## 2016-11-02 NOTE — Telephone Encounter (Signed)
Dr. Glori Bickers filled out the DNR form and gave her a MOST form to review, pt also advise to tell the hospital when she goes in for surgery that she is DNR, so the hospital is aware before her surgery

## 2016-11-07 ENCOUNTER — Encounter (HOSPITAL_COMMUNITY): Payer: Self-pay | Admitting: Anesthesiology

## 2016-11-07 NOTE — Anesthesia Preprocedure Evaluation (Addendum)
Anesthesia Evaluation  Patient identified by MRN, date of birth, ID band Patient awake    Reviewed: Allergy & Precautions, NPO status , Patient's Chart, lab work & pertinent test results  History of Anesthesia Complications (+) PONV and history of anesthetic complications  Airway Mallampati: I  TM Distance: >3 FB Neck ROM: Full    Dental  (+) Teeth Intact, Dental Advisory Given   Pulmonary neg pulmonary ROS,    breath sounds clear to auscultation       Cardiovascular negative cardio ROS   Rhythm:Regular Rate:Normal     Neuro/Psych  Headaches, PSYCHIATRIC DISORDERS Anxiety    GI/Hepatic Neg liver ROS, hiatal hernia, GERD  Medicated,  Endo/Other  Hypothyroidism   Renal/GU negative Renal ROS  negative genitourinary   Musculoskeletal negative musculoskeletal ROS (+)   Abdominal   Peds negative pediatric ROS (+)  Hematology negative hematology ROS (+)   Anesthesia Other Findings   Reproductive/Obstetrics negative OB ROS                            Lab Results  Component Value Date   WBC 9.2 10/29/2016   HGB 14.2 10/29/2016   HCT 41.3 10/29/2016   MCV 88.2 10/29/2016   PLT 192 10/29/2016   Lab Results  Component Value Date   CREATININE 0.7 07/03/2016   BUN 11.8 07/03/2016   NA 142 07/03/2016   K 4.1 07/03/2016   CL 103 05/11/2016   CO2 27 07/03/2016   No results found for: INR, PROTIME  10/2016 EKG: normal sinus rhythm.   Anesthesia Physical Anesthesia Plan  ASA: II  Anesthesia Plan: General   Post-op Pain Management:    Induction: Intravenous  Airway Management Planned: Oral ETT  Additional Equipment:   Intra-op Plan:   Post-operative Plan: Extubation in OR  Informed Consent: I have reviewed the patients History and Physical, chart, labs and discussed the procedure including the risks, benefits and alternatives for the proposed anesthesia with the patient or  authorized representative who has indicated his/her understanding and acceptance.   Dental advisory given  Plan Discussed with: CRNA  Anesthesia Plan Comments:         Anesthesia Quick Evaluation

## 2016-11-08 ENCOUNTER — Encounter (HOSPITAL_COMMUNITY)
Admission: RE | Disposition: A | Discharge status: Home / Self Care | Payer: Self-pay | Source: Ambulatory Visit | Attending: Obstetrics and Gynecology

## 2016-11-08 ENCOUNTER — Inpatient Hospital Stay (HOSPITAL_COMMUNITY)
Admission: RE | Admit: 2016-11-08 | Discharge: 2016-11-10 | DRG: 983 | Disposition: A | Discharge status: Home / Self Care | Payer: PPO | Source: Ambulatory Visit | Attending: Obstetrics and Gynecology | Admitting: Obstetrics and Gynecology

## 2016-11-08 ENCOUNTER — Inpatient Hospital Stay (HOSPITAL_COMMUNITY): Payer: PPO | Admitting: Anesthesiology

## 2016-11-08 ENCOUNTER — Encounter (HOSPITAL_COMMUNITY): Payer: Self-pay | Admitting: *Deleted

## 2016-11-08 DIAGNOSIS — Z853 Personal history of malignant neoplasm of breast: Secondary | ICD-10-CM

## 2016-11-08 DIAGNOSIS — N882 Stricture and stenosis of cervix uteri: Secondary | ICD-10-CM | POA: Diagnosis not present

## 2016-11-08 DIAGNOSIS — M4802 Spinal stenosis, cervical region: Principal | ICD-10-CM | POA: Diagnosis present

## 2016-11-08 DIAGNOSIS — R938 Abnormal findings on diagnostic imaging of other specified body structures: Secondary | ICD-10-CM | POA: Diagnosis not present

## 2016-11-08 DIAGNOSIS — K219 Gastro-esophageal reflux disease without esophagitis: Secondary | ICD-10-CM | POA: Diagnosis present

## 2016-11-08 DIAGNOSIS — Z90722 Acquired absence of ovaries, bilateral: Secondary | ICD-10-CM

## 2016-11-08 DIAGNOSIS — Z923 Personal history of irradiation: Secondary | ICD-10-CM

## 2016-11-08 DIAGNOSIS — Z8249 Family history of ischemic heart disease and other diseases of the circulatory system: Secondary | ICD-10-CM

## 2016-11-08 DIAGNOSIS — D251 Intramural leiomyoma of uterus: Secondary | ICD-10-CM | POA: Diagnosis not present

## 2016-11-08 DIAGNOSIS — E039 Hypothyroidism, unspecified: Secondary | ICD-10-CM | POA: Diagnosis not present

## 2016-11-08 DIAGNOSIS — Z90710 Acquired absence of both cervix and uterus: Secondary | ICD-10-CM

## 2016-11-08 DIAGNOSIS — N8500 Endometrial hyperplasia, unspecified: Secondary | ICD-10-CM | POA: Diagnosis not present

## 2016-11-08 DIAGNOSIS — Z9079 Acquired absence of other genital organ(s): Secondary | ICD-10-CM

## 2016-11-08 DIAGNOSIS — Z833 Family history of diabetes mellitus: Secondary | ICD-10-CM

## 2016-11-08 HISTORY — PX: ABDOMINAL HYSTERECTOMY: SHX81

## 2016-11-08 HISTORY — PX: SALPINGOOPHORECTOMY: SHX82

## 2016-11-08 SURGERY — HYSTERECTOMY, ABDOMINAL
Anesthesia: General

## 2016-11-08 MED ORDER — PROPOFOL 10 MG/ML IV BOLUS
INTRAVENOUS | Status: AC
  Filled 2016-11-08: qty 20

## 2016-11-08 MED ORDER — GLYCOPYRROLATE 0.2 MG/ML IJ SOLN
INTRAMUSCULAR | Status: DC | PRN
  Administered 2016-11-08: .1 mg via INTRAVENOUS

## 2016-11-08 MED ORDER — LEVOTHYROXINE SODIUM 100 MCG PO TABS
100.0000 ug | ORAL_TABLET | Freq: Every day | ORAL | Status: DC
  Administered 2016-11-09 – 2016-11-10 (×2): 100 ug via ORAL
  Filled 2016-11-08 (×4): qty 1

## 2016-11-08 MED ORDER — DEXAMETHASONE SODIUM PHOSPHATE 10 MG/ML IJ SOLN
INTRAMUSCULAR | Status: AC
  Filled 2016-11-08: qty 1

## 2016-11-08 MED ORDER — BUPIVACAINE HCL (PF) 0.25 % IJ SOLN
INTRAMUSCULAR | Status: AC
  Filled 2016-11-08: qty 30

## 2016-11-08 MED ORDER — BUPIVACAINE LIPOSOME 1.3 % IJ SUSP
20.0000 mL | Freq: Once | INTRAMUSCULAR | Status: DC
  Filled 2016-11-08: qty 20

## 2016-11-08 MED ORDER — LACTATED RINGERS IV SOLN
INTRAVENOUS | Status: DC
  Administered 2016-11-08 – 2016-11-09 (×3): via INTRAVENOUS

## 2016-11-08 MED ORDER — LIDOCAINE HCL (CARDIAC) 20 MG/ML IV SOLN
INTRAVENOUS | Status: DC | PRN
  Administered 2016-11-08: 60 mg via INTRAVENOUS

## 2016-11-08 MED ORDER — MIDAZOLAM HCL 2 MG/2ML IJ SOLN
INTRAMUSCULAR | Status: DC | PRN
  Administered 2016-11-08: 1 mg via INTRAVENOUS

## 2016-11-08 MED ORDER — FENTANYL CITRATE (PF) 100 MCG/2ML IJ SOLN
INTRAMUSCULAR | Status: AC
  Filled 2016-11-08: qty 2

## 2016-11-08 MED ORDER — PROMETHAZINE HCL 25 MG/ML IJ SOLN
INTRAMUSCULAR | Status: AC
  Filled 2016-11-08: qty 1

## 2016-11-08 MED ORDER — SODIUM CHLORIDE 0.9% FLUSH: 9.0000 mL | INTRAVENOUS | Status: DC | PRN

## 2016-11-08 MED ORDER — ACETAMINOPHEN 10 MG/ML IV SOLN
1000.0000 mg | Freq: Once | INTRAVENOUS | Status: AC
  Administered 2016-11-08: 1000 mg via INTRAVENOUS
  Filled 2016-11-08: qty 100

## 2016-11-08 MED ORDER — ONDANSETRON HCL 4 MG/2ML IJ SOLN
INTRAMUSCULAR | Status: AC
  Filled 2016-11-08: qty 2

## 2016-11-08 MED ORDER — PROPOFOL 500 MG/50ML IV EMUL
INTRAVENOUS | Status: DC | PRN
  Administered 2016-11-08: 75 ug/kg/min via INTRAVENOUS

## 2016-11-08 MED ORDER — HYDROMORPHONE 1 MG/ML IV SOLN
INTRAVENOUS | Status: DC
Start: 2016-11-08 — End: 2016-11-09
  Administered 2016-11-08: 12:00:00 via INTRAVENOUS
  Administered 2016-11-08: 0.6 mL via INTRAVENOUS
  Administered 2016-11-08: 0.3 mg via INTRAVENOUS
  Administered 2016-11-09: 0 mg via INTRAVENOUS
  Filled 2016-11-08: qty 25

## 2016-11-08 MED ORDER — SCOPOLAMINE 1 MG/3DAYS TD PT72
MEDICATED_PATCH | TRANSDERMAL | Status: DC | PRN
  Administered 2016-11-08: 1 via TRANSDERMAL

## 2016-11-08 MED ORDER — MENTHOL 3 MG MT LOZG: 1.0000 | LOZENGE | OROMUCOSAL | Status: DC | PRN

## 2016-11-08 MED ORDER — PROPOFOL 10 MG/ML IV BOLUS
INTRAVENOUS | Status: DC | PRN
  Administered 2016-11-08: 150 mg via INTRAVENOUS

## 2016-11-08 MED ORDER — ROCURONIUM BROMIDE 100 MG/10ML IV SOLN
INTRAVENOUS | Status: AC
  Filled 2016-11-08: qty 1

## 2016-11-08 MED ORDER — ROCURONIUM BROMIDE 100 MG/10ML IV SOLN
INTRAVENOUS | Status: DC | PRN
  Administered 2016-11-08: 50 mg via INTRAVENOUS

## 2016-11-08 MED ORDER — FENTANYL CITRATE (PF) 250 MCG/5ML IJ SOLN
INTRAMUSCULAR | Status: AC
  Filled 2016-11-08: qty 5

## 2016-11-08 MED ORDER — BUPIVACAINE HCL (PF) 0.25 % IJ SOLN
INTRAMUSCULAR | Status: DC | PRN
  Administered 2016-11-08: 20 mL

## 2016-11-08 MED ORDER — PROMETHAZINE HCL 25 MG/ML IJ SOLN
6.2500 mg | INTRAMUSCULAR | Status: AC | PRN
  Administered 2016-11-08 (×2): 6.25 mg via INTRAVENOUS

## 2016-11-08 MED ORDER — ONDANSETRON HCL 4 MG/2ML IJ SOLN
INTRAMUSCULAR | Status: DC | PRN
  Administered 2016-11-08: 4 mg via INTRAVENOUS

## 2016-11-08 MED ORDER — PROPOFOL 500 MG/50ML IV EMUL
INTRAVENOUS | Status: AC
  Filled 2016-11-08: qty 50

## 2016-11-08 MED ORDER — FENTANYL CITRATE (PF) 100 MCG/2ML IJ SOLN
25.0000 ug | INTRAMUSCULAR | Status: DC | PRN
  Administered 2016-11-08: 25 ug via INTRAVENOUS

## 2016-11-08 MED ORDER — MIDAZOLAM HCL 2 MG/2ML IJ SOLN
INTRAMUSCULAR | Status: AC
  Filled 2016-11-08: qty 2

## 2016-11-08 MED ORDER — SUGAMMADEX SODIUM 200 MG/2ML IV SOLN
INTRAVENOUS | Status: DC | PRN
  Administered 2016-11-08: 124.6 mg via INTRAVENOUS

## 2016-11-08 MED ORDER — LACTATED RINGERS IV SOLN: INTRAVENOUS | Status: DC

## 2016-11-08 MED ORDER — DIPHENHYDRAMINE HCL 12.5 MG/5ML PO ELIX
12.5000 mg | ORAL_SOLUTION | Freq: Four times a day (QID) | ORAL | Status: DC | PRN
  Filled 2016-11-08: qty 5

## 2016-11-08 MED ORDER — DEXAMETHASONE SODIUM PHOSPHATE 4 MG/ML IJ SOLN
INTRAMUSCULAR | Status: DC | PRN
  Administered 2016-11-08: 4 mg via INTRAVENOUS

## 2016-11-08 MED ORDER — LIDOCAINE HCL (CARDIAC) 20 MG/ML IV SOLN
INTRAVENOUS | Status: AC
Start: 2016-11-08 — End: 2016-11-08
  Filled 2016-11-08: qty 5

## 2016-11-08 MED ORDER — SODIUM CHLORIDE 0.9 % IJ SOLN
INTRAMUSCULAR | Status: AC
  Filled 2016-11-08: qty 20

## 2016-11-08 MED ORDER — LACTATED RINGERS IV SOLN
INTRAVENOUS | Status: DC
  Administered 2016-11-08 (×2): via INTRAVENOUS

## 2016-11-08 MED ORDER — KETOROLAC TROMETHAMINE 30 MG/ML IJ SOLN: 30.0000 mg | Freq: Once | INTRAMUSCULAR | Status: DC

## 2016-11-08 MED ORDER — KETOROLAC TROMETHAMINE 30 MG/ML IJ SOLN
INTRAMUSCULAR | Status: DC | PRN
  Administered 2016-11-08 (×2): 15 mg via INTRAVENOUS

## 2016-11-08 MED ORDER — FENTANYL CITRATE (PF) 100 MCG/2ML IJ SOLN
INTRAMUSCULAR | Status: DC | PRN
  Administered 2016-11-08: 50 ug via INTRAVENOUS
  Administered 2016-11-08: 25 ug via INTRAVENOUS

## 2016-11-08 MED ORDER — SUGAMMADEX SODIUM 200 MG/2ML IV SOLN
INTRAVENOUS | Status: AC
  Filled 2016-11-08: qty 2

## 2016-11-08 MED ORDER — METOCLOPRAMIDE HCL 5 MG/ML IJ SOLN
INTRAMUSCULAR | Status: DC | PRN
  Administered 2016-11-08: 10 mg via INTRAVENOUS

## 2016-11-08 MED ORDER — DIPHENHYDRAMINE HCL 50 MG/ML IJ SOLN: 12.5000 mg | Freq: Four times a day (QID) | INTRAMUSCULAR | Status: DC | PRN

## 2016-11-08 MED ORDER — TRAMADOL HCL 50 MG PO TABS: 50.0000 mg | ORAL_TABLET | Freq: Four times a day (QID) | ORAL | Status: DC | PRN

## 2016-11-08 MED ORDER — NALOXONE HCL 0.4 MG/ML IJ SOLN: 0.4000 mg | INTRAMUSCULAR | Status: DC | PRN

## 2016-11-08 MED ORDER — MEPERIDINE HCL 25 MG/ML IJ SOLN: 6.2500 mg | INTRAMUSCULAR | Status: DC | PRN

## 2016-11-08 MED ORDER — IBUPROFEN 600 MG PO TABS
600.0000 mg | ORAL_TABLET | Freq: Four times a day (QID) | ORAL | Status: DC | PRN
  Administered 2016-11-09 – 2016-11-10 (×3): 600 mg via ORAL
  Filled 2016-11-08 (×3): qty 1

## 2016-11-08 MED ORDER — NEOSTIGMINE METHYLSULFATE 10 MG/10ML IV SOLN
INTRAVENOUS | Status: AC
  Filled 2016-11-08: qty 1

## 2016-11-08 MED ORDER — ONDANSETRON HCL 4 MG/2ML IJ SOLN
4.0000 mg | Freq: Four times a day (QID) | INTRAMUSCULAR | Status: DC | PRN
Start: 2016-11-08 — End: 2016-11-10

## 2016-11-08 MED ORDER — CEFAZOLIN SODIUM-DEXTROSE 2-4 GM/100ML-% IV SOLN
2.0000 g | INTRAVENOUS | Status: AC
  Administered 2016-11-08: 2 g via INTRAVENOUS

## 2016-11-08 MED ORDER — METOCLOPRAMIDE HCL 5 MG/ML IJ SOLN
INTRAMUSCULAR | Status: AC
  Filled 2016-11-08: qty 2

## 2016-11-08 SURGICAL SUPPLY — 40 items
CANISTER SUCT 3000ML (MISCELLANEOUS) ×4 IMPLANT
CLOTH BEACON ORANGE TIMEOUT ST (SAFETY) ×4 IMPLANT
DECANTER SPIKE VIAL GLASS SM (MISCELLANEOUS) IMPLANT
DERMABOND ADVANCED (GAUZE/BANDAGES/DRESSINGS) ×2
DERMABOND ADVANCED .7 DNX12 (GAUZE/BANDAGES/DRESSINGS) ×2 IMPLANT
DRAPE CESAREAN BIRTH W POUCH (DRAPES) ×4 IMPLANT
DRAPE WARM FLUID 44X44 (DRAPE) ×4 IMPLANT
DRSG OPSITE POSTOP 4X10 (GAUZE/BANDAGES/DRESSINGS) ×4 IMPLANT
DURAPREP 26ML APPLICATOR (WOUND CARE) ×4 IMPLANT
GAUZE SPONGE 4X4 16PLY XRAY LF (GAUZE/BANDAGES/DRESSINGS) IMPLANT
GLOVE BIO SURGEON STRL SZ 6.5 (GLOVE) ×3 IMPLANT
GLOVE BIO SURGEONS STRL SZ 6.5 (GLOVE) ×1
GLOVE BIOGEL PI IND STRL 7.0 (GLOVE) ×8 IMPLANT
GLOVE BIOGEL PI INDICATOR 7.0 (GLOVE) ×8
GOWN STRL REUS W/TWL LRG LVL3 (GOWN DISPOSABLE) ×12 IMPLANT
NEEDLE HYPO 22GX1.5 SAFETY (NEEDLE) ×4 IMPLANT
NS IRRIG 1000ML POUR BTL (IV SOLUTION) ×8 IMPLANT
PACK ABDOMINAL GYN (CUSTOM PROCEDURE TRAY) ×4 IMPLANT
PAD OB MATERNITY 4.3X12.25 (PERSONAL CARE ITEMS) ×4 IMPLANT
PENCIL SMOKE EVAC W/HOLSTER (ELECTROSURGICAL) ×4 IMPLANT
PROTECTOR NERVE ULNAR (MISCELLANEOUS) ×4 IMPLANT
SEPRAFILM MEMBRANE 5X6 (MISCELLANEOUS) IMPLANT
SPONGE LAP 18X18 X RAY DECT (DISPOSABLE) ×8 IMPLANT
STAPLER VISISTAT 35W (STAPLE) IMPLANT
SUT PDS AB 0 CT 36 (SUTURE) IMPLANT
SUT PDS AB 0 CTX 60 (SUTURE) IMPLANT
SUT PLAIN 2 0 XLH (SUTURE) IMPLANT
SUT VIC AB 0 CT1 18XCR BRD8 (SUTURE) ×4 IMPLANT
SUT VIC AB 0 CT1 27 (SUTURE) ×8
SUT VIC AB 0 CT1 27XBRD ANBCTR (SUTURE) ×8 IMPLANT
SUT VIC AB 0 CT1 8-18 (SUTURE) ×4
SUT VIC AB 3-0 PS1 18 (SUTURE)
SUT VIC AB 3-0 PS1 18X BRD (SUTURE) IMPLANT
SUT VIC AB 4-0 KS 27 (SUTURE) ×4 IMPLANT
SUT VICRYL 0 TIES 12 18 (SUTURE) ×4 IMPLANT
SYR CONTROL 10ML LL (SYRINGE) ×4 IMPLANT
SYRINGE 10CC LL (SYRINGE) ×4 IMPLANT
TOWEL OR 17X24 6PK STRL BLUE (TOWEL DISPOSABLE) ×8 IMPLANT
TRAY FOLEY CATH SILVER 14FR (SET/KITS/TRAYS/PACK) ×4 IMPLANT
WATER STERILE IRR 1000ML POUR (IV SOLUTION) ×4 IMPLANT

## 2016-11-08 NOTE — Progress Notes (Signed)
[place] ([place])

## 2016-11-08 NOTE — Addendum Note (Signed)
Addendum  created 11/08/16 1635 by Jonna Munro, CRNA   Sign clinical note

## 2016-11-08 NOTE — Anesthesia Postprocedure Evaluation (Signed)
Anesthesia Post Note  Patient: Gabrielle Webb  Procedure(s) Performed: Procedure(s) (LRB): HYSTERECTOMY TOTAL  ABDOMINAL (N/A) BILATERAL SALPINGO OOPHORECTOMY (Bilateral)  Patient location during evaluation: PACU Anesthesia Type: General Level of consciousness: awake and alert Pain management: pain level controlled Vital Signs Assessment: post-procedure vital signs reviewed and stable Respiratory status: spontaneous breathing, nonlabored ventilation, respiratory function stable and patient connected to nasal cannula oxygen Cardiovascular status: blood pressure returned to baseline and stable Postop Assessment: no signs of nausea or vomiting Anesthetic complications: no     Last Vitals:  Vitals:   11/08/16 1045 11/08/16 1100  BP: (!) 150/77 (!) 145/74  Pulse: 74 69  Resp: 18 (!) 22  Temp:  37.2 C    Last Pain:  Vitals:   11/08/16 1100  PainSc: 5    Pain Goal: Patients Stated Pain Goal: 3 (11/08/16 1100)               Effie Berkshire

## 2016-11-08 NOTE — Progress Notes (Signed)
H and P on the chart  Will proceed with TAH and BSO Consent signed

## 2016-11-08 NOTE — Transfer of Care (Signed)
Immediate Anesthesia Transfer of Care Note  Patient: Gabrielle Webb  Procedure(s) Performed: Procedure(s): HYSTERECTOMY TOTAL  ABDOMINAL (N/A) BILATERAL SALPINGO OOPHORECTOMY (Bilateral)  Patient Location: PACU  Anesthesia Type:General  Level of Consciousness: awake, alert  and oriented  Airway & Oxygen Therapy: Patient Spontanous Breathing and Patient connected to nasal cannula oxygen  Post-op Assessment: Report given to RN and Post -op Vital signs reviewed and stable  Post vital signs: Reviewed and stable  Last Vitals:  Vitals:   11/08/16 0614  BP: (!) 115/56  Pulse: 71  Resp: 18  Temp: 36.4 C    Last Pain: There were no vitals filed for this visit.    Patients Stated Pain Goal: 3 ([id number])  Complications: No apparent anesthesia complications

## 2016-11-08 NOTE — Brief Op Note (Signed)
11/08/2016  8:39 AM  PATIENT:  Gabrielle Webb  75 y.o. female  PRE-OPERATIVE DIAGNOSIS:  History of Breast Cancer Cervical Stenosis Thickened endometrium  POST-OPERATIVE DIAGNOSIS:  Same  PROCEDURE:  Procedure(s): HYSTERECTOMY TOTAL  ABDOMINAL (N/A) BILATERAL SALPINGO OOPHORECTOMY (Bilateral)  SURGEON:  Surgeon(s) and Role:    * Dian Queen, MD - Primary    * Tyson Dense, MD - Assisting  PHYSICIAN ASSISTANT:   ASSISTANTS: none   ANESTHESIA:   general  EBL:  Total I/O In: 1000 [I.V.:1000] Out: 200 [Urine:100; Blood:100]  BLOOD ADMINISTERED:none  DRAINS: Urinary Catheter (Foley)   LOCAL MEDICATIONS USED:  NONE  SPECIMEN:  Source of Specimen:  uterus, cervix, tubes and ovaries  DISPOSITION OF SPECIMEN:  PATHOLOGY  COUNTS:  YES  TOURNIQUET:  * No tourniquets in log *  DICTATION: .Other Dictation: Dictation Number [id number]  PLAN OF CARE: Admit to inpatient   PATIENT DISPOSITION:  PACU - hemodynamically stable.   Delay start of Pharmacological VTE agent (>24hrs) due to surgical blood loss or risk of bleeding: not applicable

## 2016-11-08 NOTE — Addendum Note (Signed)
Addendum  created 11/08/16 1146 by Flossie Dibble, CRNA   Anesthesia Intra LDAs edited, LDA properties accepted

## 2016-11-08 NOTE — Anesthesia Procedure Notes (Signed)
Procedure Name: Intubation Date/Time: 11/08/2016 7:37 AM Performed by: Flossie Dibble Pre-anesthesia Checklist: Patient identified, Emergency Drugs available, Suction available, Patient being monitored and Timeout performed Patient Re-evaluated:Patient Re-evaluated prior to inductionOxygen Delivery Method: Circle system utilized Preoxygenation: Pre-oxygenation with 100% oxygen Intubation Type: IV induction Ventilation: Mask ventilation without difficulty Laryngoscope Size: Mac and 3 Grade View: Grade I Tube type: Oral Tube size: 7.0 mm Number of attempts: 1 Airway Equipment and Method: Stylet Placement Confirmation: ETT inserted through vocal cords under direct vision,  positive ETCO2 and breath sounds checked- equal and bilateral Secured at: 20 cm Tube secured with: Tape Dental Injury: Teeth and Oropharynx as per pre-operative assessment

## 2016-11-08 NOTE — Anesthesia Postprocedure Evaluation (Signed)
Anesthesia Post Note  Patient: Gabrielle Webb  Procedure(s) Performed: Procedure(s) (LRB): HYSTERECTOMY TOTAL  ABDOMINAL (N/A) BILATERAL SALPINGO OOPHORECTOMY (Bilateral)  Patient location during evaluation: [facility] Anesthesia Type: General Level of consciousness: awake and alert, oriented and sedated Pain management: pain level controlled Respiratory status: spontaneous breathing, nonlabored ventilation, patient connected to nasal cannula oxygen and respiratory function stable Cardiovascular status: stable Postop Assessment: no signs of nausea or vomiting and adequate PO intake Anesthetic complications: no     Last Vitals:  Vitals:   11/08/16 1430 11/08/16 1541  BP: 140/88   Pulse: 77   Resp: 20 (!) 21  Temp: 36.7 C     Last Pain:  Vitals:   11/08/16 1541  TempSrc:   PainSc: 3    Pain Goal: Patients Stated Pain Goal: 4 (11/08/16 1230)               [name]

## 2016-11-09 ENCOUNTER — Encounter (HOSPITAL_COMMUNITY): Payer: Self-pay

## 2016-11-09 LAB — BASIC METABOLIC PANEL
ANION GAP: 7 (ref 5–15)
BUN: 13 mg/dL (ref 6–20)
CHLORIDE: 104 mmol/L (ref 101–111)
CO2: 26 mmol/L (ref 22–32)
CREATININE: 0.67 mg/dL (ref 0.44–1.00)
Calcium: 8.6 mg/dL — ABNORMAL LOW (ref 8.9–10.3)
GFR calc non Af Amer: >60 mL/min (ref >60)
Glucose, Bld: 122 mg/dL — ABNORMAL HIGH (ref 65–99)
Potassium: 3.2 mmol/L — ABNORMAL LOW (ref 3.5–5.1)
Sodium: 137 mmol/L (ref 135–145)

## 2016-11-09 LAB — CBC
HCT: 33.9 % — ABNORMAL LOW (ref 36.0–46.0)
HEMOGLOBIN: 11.8 g/dL — AB (ref 12.0–15.0)
MCH: 30.3 pg (ref 26.0–34.0)
MCHC: 34.8 g/dL (ref 30.0–36.0)
MCV: 87.1 fL (ref 78.0–100.0)
Platelets: 181 10*3/uL (ref 150–400)
RBC: 3.89 MIL/uL (ref 3.87–5.11)
RDW: 13.6 % (ref 11.5–15.5)
WBC: 8.9 10*3/uL (ref 4.0–10.5)

## 2016-11-09 NOTE — Op Note (Signed)
Gabrielle Webb, Gabrielle Webb             ACCOUNT NO.:  [id number]  MEDICAL RECORD NO.:  [id number]  LOCATION:  [facility]                          FACILITY:  [facility]  PHYSICIAN:  Toba Claudio L. Decorian Schuenemann, M.D.DATE OF BIRTH:  Mar 16, 1941  DATE OF PROCEDURE:  11/08/2016 DATE OF DISCHARGE:                              OPERATIVE REPORT   PREOPERATIVE DIAGNOSES:  Cervical stenosis, thickened endometrium, and a history of breast cancer.  POSTOPERATIVE DIAGNOSES:  Cervical stenosis, thickened endometrium, and a history of breast cancer.  PROCEDURE:  Total abdominal hysterectomy and bilateral salpingo- oophorectomy.  SURGEON:  Dr. Helane Rima.  ASSISTANT:  Dr. __________.  EBL:  100 mL.  COMPLICATIONS:  None.  DRAINS:  Foley catheter.  PATHOLOGY:  Uterus, cervix, tubes, and ovaries.  PROCEDURE:  The patient was taken to the operating room.  She was intubated.  She was prepped and draped in the usual sterile fashion. Time-out was performed after a Foley catheter was inserted, and a low transverse incision was made and carried down to the fascia.  Fascia was scored in the midline and extended laterally.  Rectus muscles were separated in the midline.  The peritoneum was entered bluntly.  The peritoneal incision was then stretched.  A self-retaining retractor was placed in the abdominal cavity, and __________ were used to pack the small and large bowel in the upper abdomen, and exam revealed essentially a normal pelvis with no adhesions.  Ovaries appeared normal. We used Kelly clamps to elevate the triple pedicle on either side, identified the round ligament on the right, suture ligated that with 0 Vicryl suture, and divided the anterior and posterior leaves of the broad ligament, and developed the bladder flap.  This was done on the left side in identical fashion.  We then identified the ovary on the right side and infundibulopelvic ligament, and elevated that with a Babcock clamp, and created an avascular  window just beneath this, with careful attention to avoid the ureter, and a curved Heaney clamp was placed across that.  The pedicle was cut and suture ligated using 0 Vicryl suture, and also secured with a free tie of 0 Vicryl suture. This was done on the left side in identical fashion.  We then skeletonized the ovaries as far as the uterine arteries, and then with careful attention to avoid the bladder, we then clamped the uterine artery at the level of the internal os using curved Heaney clamps.  The pedicle was cut and suture ligated using 0 Vicryl suture.  We then further developed the bladder flap and placed a series of straight Heaney clamps just beside the cervix of the uterosacral cardinal ligaments.  Each pedicle was cut, clamped, and suture ligated using 0 Vicryl suture.  We then placed curved Heaney clamps just beneath the cervical os.  The specimen was removed and identified as cervix, uterus, tubes, and ovary, and angle stitches were placed using 0 Vicryl, and then the remainder of the vaginal cuff was closed using 0 Vicryl suture. We then irrigated the pelvis.  Hemostasis was noted.  All instruments and laparotomy pads were removed from the abdominal cavity.  The peritoneum was closed using 0 Vicryl.  The fascia was closed using 0  Vicryl in a running stitch.  The skin was closed with 4-0 Vicryl on a Keith needle. Local was infiltrated.  Dermabond was applied, and then a honeycomb dressing was applied.  All sponge, lap, and instrument counts were correct x2.  The patient went to recovery room in stable condition.     Eleanna Theilen L. Helane Rima, M.D.     [name]  D:  11/08/2016  T:  11/09/2016  Job:  [id number]

## 2016-11-09 NOTE — Progress Notes (Signed)
Patient doing well. Eating Breakfast. Voiding.  BP (!) 104/49 (BP Location: Left Arm)   Pulse 67   Temp 98.4 F (36.9 C) (Oral)   Resp 15   Ht 5\' 4"  (1.626 m)   Wt 137 lb (62.1 kg)   LMP 12/03/1988   SpO2 100%   BMI 23.52 kg/m  Results for orders placed or performed during the hospital encounter of 11/08/16 (from the past 24 hour(s))  Basic metabolic panel     Status: Abnormal   Collection Time: 11/09/16  6:22 AM  Result Value Ref Range   Sodium 137 135 - 145 mmol/L   Potassium 3.2 (L) 3.5 - 5.1 mmol/L   Chloride 104 101 - 111 mmol/L   CO2 26 22 - 32 mmol/L   Glucose, Bld 122 (H) 65 - 99 mg/dL   BUN 13 6 - 20 mg/dL   Creatinine, Ser 0.67 0.44 - 1.00 mg/dL   Calcium 8.6 (L) 8.9 - 10.3 mg/dL   GFR calc non Af Amer >60 >60 mL/min   GFR calc Af Amer >60 >60 mL/min   Anion gap 7 5 - 15  CBC     Status: Abnormal   Collection Time: 11/09/16  6:22 AM  Result Value Ref Range   WBC 8.9 4.0 - 10.5 K/uL   RBC 3.89 3.87 - 5.11 MIL/uL   Hemoglobin 11.8 (L) 12.0 - 15.0 g/dL   HCT 33.9 (L) 36.0 - 46.0 %   MCV 87.1 78.0 - 100.0 fL   MCH 30.3 26.0 - 34.0 pg   MCHC 34.8 30.0 - 36.0 g/dL   RDW 13.6 11.5 - 15.5 %   Platelets 181 150 - 400 K/uL   Abdomen is soft and non tender Incision clean and dry Some bruising around incision  IMPRESSION: POD #1 Doing really well Ambulate No pain medications required Discharge home tomorrow

## 2016-11-09 NOTE — Progress Notes (Signed)
Dr. Helane Rima informed of patient's low blood pressures. Patient asymptomatic at this time. No new orders, nurse to continue to monitor.

## 2016-11-10 ENCOUNTER — Encounter (HOSPITAL_COMMUNITY): Payer: Self-pay | Admitting: Obstetrics and Gynecology

## 2016-11-10 MED ORDER — IBUPROFEN 600 MG PO TABS: 600.0000 mg | ORAL_TABLET | Freq: Four times a day (QID) | ORAL | 0 refills | Status: DC | PRN

## 2016-11-10 NOTE — Plan of Care (Signed)
Problem: Health Behavior/Discharge Planning: Goal: Ability to manage health-related needs will improve Outcome: Progressing Improved health related needs,  Problem: Bowel/Gastric: Goal: Will not experience complications related to bowel motility Outcome: Progressing Good bowel sounds. Passing flatus. Tolerating PO intake.  Problem: Education: Goal: Knowledge of the prescribed therapeutic regimen will improve Outcome: Completed/Met Date Met: 11/10/16   Good knowledge of prescribed therapeutic regimen.  Problem: Skin Integrity: Goal: Demonstration of wound healing without infection will improve Outcome: Progressing Incision line bruised .

## 2016-11-10 NOTE — Discharge Summary (Signed)
Admission Diagnosis: Thickened endometrium Cervical Stenosis History of Breat Cancer  Discharge Diagnosis: Same  Hospital Course; 75 year old female admitted for TAH and BSO. She did very well post op. ON POD #1 she was doing great and there was some bruising noted on her incision.  ON POD #2 she was ambulating, voiding and tolerating regular diet.  BP (!) 87/57 (BP Location: Left Arm)   Pulse 96   Temp 98.6 F (37 C) (Oral)   Resp 17   Ht 5\' 4"  (1.626 m)   Wt 137 lb (62.1 kg)   LMP 12/03/1988   SpO2 96%   BMI 23.52 kg/m  No results found for this or any previous visit (from the past 24 hour(s)). Abdomen soft and non tender Incision clean and dry  She did have soft ecchymosis involving right side of incision  No hematoma felt  Patient discharge home  Follow up in 1 week Strict discharge instructions given. Advised to call if incision swells more or any drainage from incision site

## 2016-11-11 NOTE — Addendum Note (Signed)
Addendum  created 11/11/16 1030 by Asher Muir, CRNA   Charge Capture section accepted

## 2016-11-20 DIAGNOSIS — N390 Urinary tract infection, site not specified: Secondary | ICD-10-CM | POA: Diagnosis not present

## 2016-11-22 DIAGNOSIS — H00024 Hordeolum internum left upper eyelid: Secondary | ICD-10-CM | POA: Diagnosis not present

## 2016-11-30 ENCOUNTER — Telehealth: Payer: Self-pay

## 2016-11-30 NOTE — Telephone Encounter (Signed)
Patient called back to let you know the meds she is allergic to 1)  Fentanyl 2)  Hydromorphone

## 2016-11-30 NOTE — Telephone Encounter (Signed)
Pt left v/m when had surgery first of Dec. Pt was given 2 narcotics and pt wants added to allergy list. I spoke with pt but she was driving and pt will cb with names of meds.

## 2016-11-30 NOTE — Telephone Encounter (Signed)
Allergies and contraindications list updated as requested.

## 2016-12-10 DIAGNOSIS — H00012 Hordeolum externum right lower eyelid: Secondary | ICD-10-CM | POA: Diagnosis not present

## 2016-12-14 DIAGNOSIS — H00022 Hordeolum internum right lower eyelid: Secondary | ICD-10-CM | POA: Diagnosis not present

## 2016-12-31 ENCOUNTER — Telehealth: Payer: Self-pay

## 2016-12-31 MED ORDER — OSELTAMIVIR PHOSPHATE 75 MG PO CAPS: 75.0000 mg | ORAL_CAPSULE | Freq: Every day | ORAL | 0 refills | Status: DC

## 2016-12-31 NOTE — Telephone Encounter (Signed)
Patient's mom is in the hospital with Type A flu.  Due to this diagnosis patient is questioning whether she should be put on Tamiflu as preventative.  She is not reporting any symptoms currently.    Please Advise

## 2016-12-31 NOTE — Telephone Encounter (Signed)
Yes- [facility]

## 2017-01-01 NOTE — Telephone Encounter (Signed)
Pt notified Rx sent to pharmacy

## 2017-01-22 DIAGNOSIS — Z09 Encounter for follow-up examination after completed treatment for conditions other than malignant neoplasm: Secondary | ICD-10-CM | POA: Diagnosis not present

## 2017-02-14 DIAGNOSIS — L570 Actinic keratosis: Secondary | ICD-10-CM | POA: Diagnosis not present

## 2017-02-14 DIAGNOSIS — Z08 Encounter for follow-up examination after completed treatment for malignant neoplasm: Secondary | ICD-10-CM | POA: Diagnosis not present

## 2017-02-14 DIAGNOSIS — C44519 Basal cell carcinoma of skin of other part of trunk: Secondary | ICD-10-CM | POA: Diagnosis not present

## 2017-02-14 DIAGNOSIS — L821 Other seborrheic keratosis: Secondary | ICD-10-CM | POA: Diagnosis not present

## 2017-02-14 DIAGNOSIS — D485 Neoplasm of uncertain behavior of skin: Secondary | ICD-10-CM | POA: Diagnosis not present

## 2017-02-14 DIAGNOSIS — L814 Other melanin hyperpigmentation: Secondary | ICD-10-CM | POA: Diagnosis not present

## 2017-02-14 DIAGNOSIS — Z85828 Personal history of other malignant neoplasm of skin: Secondary | ICD-10-CM | POA: Diagnosis not present

## 2017-02-14 DIAGNOSIS — X32XXXA Exposure to sunlight, initial encounter: Secondary | ICD-10-CM | POA: Diagnosis not present

## 2017-02-16 ENCOUNTER — Other Ambulatory Visit: Payer: Self-pay | Admitting: Family Medicine

## 2017-03-15 ENCOUNTER — Other Ambulatory Visit: Payer: Self-pay | Admitting: Family Medicine

## 2017-03-28 DIAGNOSIS — C44519 Basal cell carcinoma of skin of other part of trunk: Secondary | ICD-10-CM | POA: Diagnosis not present

## 2017-04-05 ENCOUNTER — Other Ambulatory Visit: Payer: Self-pay | Admitting: Family Medicine

## 2017-04-05 NOTE — Telephone Encounter (Signed)
Last filled on 09/09/15 #30 tabs with 1 additional refill, CPE scheduled on 05/22/17, please advise

## 2017-04-05 NOTE — Telephone Encounter (Signed)
Rx called in to requested pharmacy 

## 2017-05-13 ENCOUNTER — Other Ambulatory Visit: Payer: Self-pay | Admitting: Obstetrics and Gynecology

## 2017-05-13 ENCOUNTER — Other Ambulatory Visit: Payer: Self-pay | Admitting: Oncology

## 2017-05-13 DIAGNOSIS — Z853 Personal history of malignant neoplasm of breast: Secondary | ICD-10-CM

## 2017-05-16 DIAGNOSIS — B9689 Other specified bacterial agents as the cause of diseases classified elsewhere: Secondary | ICD-10-CM | POA: Diagnosis not present

## 2017-05-16 DIAGNOSIS — J019 Acute sinusitis, unspecified: Secondary | ICD-10-CM | POA: Diagnosis not present

## 2017-05-20 ENCOUNTER — Telehealth: Payer: Self-pay | Admitting: Family Medicine

## 2017-05-20 ENCOUNTER — Ambulatory Visit (INDEPENDENT_AMBULATORY_CARE_PROVIDER_SITE_OTHER): Payer: PPO

## 2017-05-20 VITALS — BP 124/72 | HR 70 | Temp 97.9°F | Ht 63.0 in | Wt 138.5 lb

## 2017-05-20 DIAGNOSIS — Z0000 Encounter for general adult medical examination without abnormal findings: Secondary | ICD-10-CM

## 2017-05-20 LAB — CBC WITH DIFFERENTIAL/PLATELET
Basophils Absolute: 0 10*3/uL (ref 0.0–0.1)
Basophils Relative: 0.3 % (ref 0.0–3.0)
EOS PCT: 2.4 % (ref 0.0–5.0)
Eosinophils Absolute: 0.2 10*3/uL (ref 0.0–0.7)
HEMATOCRIT: 42.5 % (ref 36.0–46.0)
Hemoglobin: 14.2 g/dL (ref 12.0–15.0)
LYMPHS PCT: 35.2 % (ref 12.0–46.0)
Lymphs Abs: 2.7 10*3/uL (ref 0.7–4.0)
MCHC: 33.4 g/dL (ref 30.0–36.0)
MCV: 89.9 fl (ref 78.0–100.0)
MONOS PCT: 8.7 % (ref 3.0–12.0)
Monocytes Absolute: 0.7 10*3/uL (ref 0.1–1.0)
Neutro Abs: 4.1 10*3/uL (ref 1.4–7.7)
Neutrophils Relative %: 53.4 % (ref 43.0–77.0)
Platelets: 204 10*3/uL (ref 150.0–400.0)
RBC: 4.72 Mil/uL (ref 3.87–5.11)
RDW: 13.9 % (ref 11.5–15.5)
WBC: 7.7 10*3/uL (ref 4.0–10.5)

## 2017-05-20 LAB — COMPREHENSIVE METABOLIC PANEL
ALT: 12 U/L (ref 0–35)
AST: 20 U/L (ref 0–37)
Albumin: 4.3 g/dL (ref 3.5–5.2)
Alkaline Phosphatase: 59 U/L (ref 39–117)
BUN: 18 mg/dL (ref 6–23)
CALCIUM: 9.8 mg/dL (ref 8.4–10.5)
CHLORIDE: 103 meq/L (ref 96–112)
CO2: 31 mEq/L (ref 19–32)
CREATININE: 0.71 mg/dL (ref 0.40–1.20)
GFR: 85.53 mL/min (ref >60.00)
Glucose, Bld: 89 mg/dL (ref 70–99)
POTASSIUM: 3.9 meq/L (ref 3.5–5.1)
Sodium: 139 mEq/L (ref 135–145)
Total Bilirubin: 0.5 mg/dL (ref 0.2–1.2)
Total Protein: 6.9 g/dL (ref 6.0–8.3)

## 2017-05-20 LAB — LIPID PANEL
CHOLESTEROL: 210 mg/dL — AB (ref 0–200)
HDL: 60.4 mg/dL (ref >39.00)
LDL CALC: 128 mg/dL — AB (ref 0–99)
NonHDL: 149.42
TRIGLYCERIDES: 105 mg/dL (ref 0.0–149.0)
Total CHOL/HDL Ratio: 3
VLDL: 21 mg/dL (ref 0.0–40.0)

## 2017-05-20 LAB — TSH: TSH: 1.18 u[IU]/mL (ref 0.35–4.50)

## 2017-05-20 NOTE — Patient Instructions (Signed)
Gabrielle Webb , Thank you for taking time to come for your Medicare Wellness Visit. I appreciate your ongoing commitment to your health goals. Please review the following plan we discussed and let me know if I can assist you in the future.   These are the goals we discussed: Goals    . Increase physical activity          Starting 05/20/2017, I will continue to exercise 15 min every other day.        This is a list of the screening recommended for you and due dates:  Health Maintenance  Topic Date Due  . Tetanus Vaccine  05/20/2018*  . Pap Smear  06/06/2018*  . Mammogram  06/21/2018*  . Flu Shot  07/03/2017  . Colon Cancer Screening  07/14/2023  . DEXA scan (bone density measurement)  Completed  . Pneumonia vaccines  Completed  *Topic was postponed. The date shown is not the original due date.   Preventive Care for Adults  A healthy lifestyle and preventive care can promote health and wellness. Preventive health guidelines for adults include the following key practices.  . A routine yearly physical is a good way to check with your health care provider about your health and preventive screening. It is a chance to share any concerns and updates on your health and to receive a thorough exam.  . Visit your dentist for a routine exam and preventive care every 6 months. Brush your teeth twice a day and floss once a day. Good oral hygiene prevents tooth decay and gum disease.  . The frequency of eye exams is based on your age, health, family medical history, use  of contact lenses, and other factors. Follow your health care provider's ecommendations for frequency of eye exams.  . Eat a healthy diet. Foods like vegetables, fruits, whole grains, low-fat dairy products, and lean protein foods contain the nutrients you need without too many calories. Decrease your intake of foods high in solid fats, added sugars, and salt. Eat the right amount of calories for you. Get information about a proper  diet from your health care provider, if necessary.  . Regular physical exercise is one of the most important things you can do for your health. Most adults should get at least 150 minutes of moderate-intensity exercise (any activity that increases your heart rate and causes you to sweat) each week. In addition, most adults need muscle-strengthening exercises on 2 or more days a week.  Silver Sneakers may be a benefit available to you. To determine eligibility, you may visit the website: www.silversneakers.com or contact program at 820-166-3976 Mon-Fri between 8AM-8PM.   . Maintain a healthy weight. The body mass index (BMI) is a screening tool to identify possible weight problems. It provides an estimate of body fat based on height and weight. Your health care provider can find your BMI and can help you achieve or maintain a healthy weight.   For adults 20 years and older: ? A BMI below 18.5 is considered underweight. ? A BMI of 18.5 to 24.9 is normal. ? A BMI of 25 to 29.9 is considered overweight. ? A BMI of 30 and above is considered obese.   . Maintain normal blood lipids and cholesterol levels by exercising and minimizing your intake of saturated fat. Eat a balanced diet with plenty of fruit and vegetables. Blood tests for lipids and cholesterol should begin at age 67 and be repeated every 5 years. If your lipid or cholesterol levels  are high, you are over 50, or you are at high risk for heart disease, you may need your cholesterol levels checked more frequently. Ongoing high lipid and cholesterol levels should be treated with medicines if diet and exercise are not working.  . If you smoke, find out from your health care provider how to quit. If you do not use tobacco, please do not start.  . If you choose to drink alcohol, please do not consume more than 2 drinks per day. One drink is considered to be 12 ounces (355 mL) of beer, 5 ounces (148 mL) of wine, or 1.5 ounces (44 mL) of  liquor.  . If you are 57-83 years old, ask your health care provider if you should take aspirin to prevent strokes.  . Use sunscreen. Apply sunscreen liberally and repeatedly throughout the day. You should seek shade when your shadow is shorter than you. Protect yourself by wearing long sleeves, pants, a wide-brimmed hat, and sunglasses year round, whenever you are outdoors.  . Once a month, do a whole body skin exam, using a mirror to look at the skin on your back. Tell your health care provider of new moles, moles that have irregular borders, moles that are larger than a pencil eraser, or moles that have changed in shape or color.

## 2017-05-20 NOTE — Progress Notes (Signed)
Pre visit review using our clinic review tool, if applicable. No additional management support is needed unless otherwise documented below in the visit note. 

## 2017-05-20 NOTE — Progress Notes (Signed)
Subjective:   Gabrielle Webb is a 76 y.o. female who presents for Medicare Annual (Subsequent) preventive examination.  Review of Systems:  N/A Cardiac Risk Factors include: advanced age (>66men, >65 women);dyslipidemia     Objective:     Vitals: BP 124/72 (BP Location: Left Arm, Patient Position: Sitting, Cuff Size: Normal)   Pulse 70   Temp 97.9 F (36.6 C) (Oral)   Ht 5\' 3"  (1.6 m)   Wt 138 lb 8 oz (62.8 kg)   LMP 12/03/1988   SpO2 98%   BMI 24.53 kg/m   Body mass index is 24.53 kg/m.   Tobacco History  Smoking Status  . Never Smoker  Smokeless Tobacco  . Never Used     Counseling given: No   Past Medical History:  Diagnosis Date  . Allergy   . Anxiety   . Breast cancer ([place]) 7/12   Right  . Cellulitis   . Diverticulosis of colon   . GERD (gastroesophageal reflux disease)   . Headache    sinus  . History of hiatal hernia   . Hx of radiation therapy 06/20/11 - 07/11/11   right breast  . Hypothyroid   . Osteoporosis   . PONV (postoperative nausea and vomiting)   . Skin cancer    basal and squamous cell  . Vertigo    Past Surgical History:  Procedure Laterality Date  . ABDOMINAL HYSTERECTOMY N/A 11/08/2016   Procedure: HYSTERECTOMY TOTAL  ABDOMINAL;  Surgeon: Dian Queen, MD;  Location: [facility];  Service: Gynecology;  Laterality: N/A;  . APPENDECTOMY  07/2007  . back sugery  1970  . BREAST BIOPSY    . BREAST LUMPECTOMY Right 05/16/2011  . COLONOSCOPY    . DILATION AND CURETTAGE OF UTERUS N/A 05/31/2014   Procedure: DILATATION AND CURETTAGE WITH ULTRASOUND GUIDANCE;  Surgeon: Cyril Mourning, MD;  Location: [facility];  Service: Gynecology;  Laterality: N/A;  . [place]  . right eye surgery    . SALPINGOOPHORECTOMY Bilateral 11/08/2016   Procedure: BILATERAL SALPINGO OOPHORECTOMY;  Surgeon: Dian Queen, MD;  Location: [facility];  Service: Gynecology;  Laterality: Bilateral;  . UPPER GI ENDOSCOPY     Family History  Problem Relation  Age of Onset  . Heart attack Father   . Hypertension Father   . Atrial fibrillation Mother   . Coronary artery disease Mother   . Diabetes Unknown        Grandmother  . Coronary artery disease Unknown        Grandmother  . Uterine cancer Unknown        Grandmother  . Leukemia Unknown        Aunt  . Cancer Maternal Aunt        leukemia  . Cancer Maternal Uncle        colon   History  Sexual Activity  . Sexual activity: No    Outpatient Encounter Prescriptions as of 05/20/2017  Medication Sig  . alendronate (FOSAMAX) 70 MG tablet ONE TABLET BY MOUTH EVERY 7 DAYS TAKE WITH FULL GLASS OF WATER DO NOTLIE DOWNFOR THE NEXT 30 MIN  . ALPRAZolam (XANAX) 0.25 MG tablet TAKE ONE TABLET BY MOUTH AS NEEDED FOR ANXIETY OR SLEEP  . amoxicillin-clavulanate (AUGMENTIN) 875-125 MG tablet Take by mouth.  Marland Kitchen aspirin 81 MG tablet Take 81 mg by mouth daily.   . benzonatate (TESSALON) 200 MG capsule Take by mouth.  . chlorthalidone (HYGROTON) 50 MG tablet Take 1 tablet (50  mg total) by mouth daily as needed. For swelling.  . Cholecalciferol (VITAMIN D) 2000 UNITS CAPS Take 1 capsule by mouth daily.  . diclofenac sodium (VOLTAREN) 1 % GEL Apply 2 g topically 4 (four) times daily. Rub into affected area of foot 2 to 4 times daily (Patient taking differently: Apply 2 g topically 4 (four) times daily. Rub into affected area of foot 2 to 4 times daily as needed pain)  . dicyclomine (BENTYL) 10 MG capsule Take 1 capsule (10 mg total) by mouth 2 (two) times daily as needed. (Patient taking differently: Take 10 mg by mouth 2 (two) times daily as needed for spasms. )  . fluticasone (FLONASE) 50 MCG/ACT nasal spray 2 SPRAYS IN EACH NOSTRIL EVERY DAY  . levothyroxine (SYNTHROID, LEVOTHROID) 100 MCG tablet TAKE ONE TABLET BY MOUTH EVERY DAY  . Multiple Vitamin (MULTIVITAMIN) capsule Take 1 capsule by mouth daily.    . Naproxen Sodium (ALEVE PO) Take 1 tablet by mouth as needed.  . niacin 500 MG tablet Take 1,000 mg  by mouth at bedtime.   Marland Kitchen OVER THE COUNTER MEDICATION Take 1 tablet by mouth daily as needed (allergies). Over the counter allergy medication  . ranitidine (ZANTAC) 150 MG tablet Take 150 mg by mouth as needed for heartburn.   . [DISCONTINUED] diphenhydrAMINE (BENADRYL) 25 MG tablet Take 25 mg by mouth every 6 (six) hours as needed for allergies (1-2 per day).  . [DISCONTINUED] ibuprofen (ADVIL,MOTRIN) 600 MG tablet Take 1 tablet (600 mg total) by mouth every 6 (six) hours as needed (mild pain).  . [DISCONTINUED] oseltamivir (TAMIFLU) 75 MG capsule Take 1 capsule (75 mg total) by mouth daily.   No facility-administered encounter medications on file as of 05/20/2017.     Activities of Daily Living In your present state of health, do you have any difficulty performing the following activities: 05/20/2017 11/08/2016  Hearing? [name]  Vision? N N  Difficulty concentrating or making decisions? N N  Walking or climbing stairs? N N  Dressing or bathing? N N  Doing errands, shopping? N N  Preparing Food and eating ? N -  Using the Toilet? N -  In the past six months, have you accidently leaked urine? N -  Do you have problems with loss of bowel control? N -  Managing your Medications? N -  Managing your Finances? N -  Housekeeping or managing your Housekeeping? N -  Some recent data might be hidden    Patient Care Team: Tower, Wynelle Fanny, MD as PCP - General Magrinat, Virgie Dad, MD as Consulting Physician (Oncology) Dian Queen, MD as Consulting Physician (Obstetrics and Gynecology) Eula Flax, OD as Referring Physician (Ophthalmology) Clyde Canterbury, MD as Referring Physician (Otolaryngology) Oneta Rack, MD as Consulting Physician (Dermatology) Ocie Cornfield, DDS as Consulting Physician (Dentistry)    Assessment:     Hearing Screening   125Hz  250Hz  500Hz  1000Hz  2000Hz  3000Hz  4000Hz  6000Hz  8000Hz   Right ear:   40 40 40  40    Left ear:           Comments: Left ear -  deaf  Vision Screening Comments: Last vision exam in 2017 with Dr. Glennon Mac   Exercise Activities and Dietary recommendations Current Exercise Habits: Home exercise routine, Type of exercise: calisthenics, Time (Minutes): 15, Frequency (Times/Week): 3, Weekly Exercise (Minutes/Week): 45, Intensity: Mild, Exercise limited by: None identified  Goals    . Increase physical activity  Starting 05/20/2017, I will continue to exercise 15 min every other day.       Fall Risk Fall Risk  05/20/2017 05/11/2016 05/13/2015 05/11/2014 05/07/2013  Falls in the past year? No No No No No   Depression Screen PHQ 2/9 Scores 05/20/2017 05/11/2016 05/13/2015 05/11/2014  PHQ - 2 Score 0 0 0 0     Cognitive Function MMSE - Mini Mental State Exam 05/20/2017 05/11/2016  Orientation to time 5 5  Orientation to Place 5 5  Registration 3 3  Attention/ Calculation 0 0  Recall 3 3  Language- name 2 objects 0 0  Language- repeat 1 1  Language- follow 3 step command 3 3  Language- read & follow direction 0 0  Write a sentence 0 0  Copy design 0 0  Total score 20 20       PLEASE NOTE: A Mini-Cog screen was completed. Maximum score is 20. A value of 0 denotes this part of Folstein MMSE was not completed or the patient failed this part of the Mini-Cog screening.   Mini-Cog Screening Orientation to Time - Max 5 pts Orientation to Place - Max 5 pts Registration - Max 3 pts Recall - Max 3 pts Language Repeat - Max 1 pts Language Follow 3 Step Command - Max 3 pts  Immunization History  Administered Date(s) Administered  . Influenza Split 09/17/2011  . Influenza Whole 10/17/2007, 09/27/2008, 09/23/2009  . Influenza, High Dose Seasonal PF 09/19/2015  . Influenza-Unspecified 09/01/2013, 10/19/2014, 08/24/2016  . Pneumococcal Conjugate-13 05/13/2015  . Pneumococcal Polysaccharide-23 12/16/2006, 05/11/2014  . Td 12/03/2005  . Zoster 12/03/2007   Screening Tests Health Maintenance  Topic Date Due  .  TETANUS/TDAP  05/20/2018 (Originally 12/04/2015)  . PAP SMEAR  06/06/2018 (Originally 05/12/2017)  . MAMMOGRAM  06/21/2018 (Originally 12/23/2016)  . INFLUENZA VACCINE  07/03/2017  . COLONOSCOPY  07/14/2023  . DEXA SCAN  Completed  . PNA vac Low Risk Adult  Completed      Plan:     I have personally reviewed and addressed the Medicare Annual Wellness questionnaire and have noted the following in the patient's chart:  A. Medical and social history B. Use of alcohol, tobacco or illicit drugs  C. Current medications and supplements D. Functional ability and status E.  Nutritional status F.  Physical activity G. Advance directives H. List of other physicians I.  Hospitalizations, surgeries, and ER visits in previous 12 months J.  [place] to include hearing, vision, cognitive, depression L. Referrals and appointments - none  In addition, I have reviewed and discussed with patient certain preventive protocols, quality metrics, and best practice recommendations. A written personalized care plan for preventive services as well as general preventive health recommendations were provided to patient.  See attached scanned questionnaire for additional information.   Signed,   Lindell Noe, MHA, BS, LPN Health Coach

## 2017-05-20 NOTE — Telephone Encounter (Signed)
-----   Message from Eustace Pen, LPN sent at 9/61/1643 12:43 PM EDT ----- Regarding: Labs 6/18 Please place lab orders.   [employer]

## 2017-05-20 NOTE — Progress Notes (Signed)
PCP notes:   Health maintenance:  Tetanus - PCP please address at next appt Pap Smear - per pt, GYN will address at next appt Mammogram - per pt, a future appt will be scheduled  Abnormal screenings:   None  Patient concerns:   Pt reports increased anxiety since hysterectomy.   Nurse concerns:  None  Next PCP appt:   05/22/17 @ 1030  I reviewed health advisor's note, was available for consultation, and agree with documentation and plan. Loura Pardon MD

## 2017-05-21 ENCOUNTER — Telehealth: Payer: Self-pay

## 2017-05-21 ENCOUNTER — Other Ambulatory Visit: Payer: Self-pay | Admitting: Oncology

## 2017-05-21 NOTE — Telephone Encounter (Signed)
Patient was contacted to communicate the information below. Patient verbalized understanding.   RE: Future lab appt 07/03/17  Received: Today  Message Contents  Magrinat, Virgie Dad, MD  Eustace Pen, LPN        Those labs will be fine-- no ned to repeat-- will cancel labs here   Thanks!   Previous Messages    ----- Message -----  From: Eustace Pen, LPN  Sent: 9/75/3005  9:20 PM  To: Chauncey Cruel, MD  Subject: Future lab appt 07/03/17              Dr. Jana Hakim,   Ms. Franqui completed a CBC and CMP today as part of her CPE labs for Dr. Glori Bickers.   Pt has upcoming lab appt with your office on 07/03/17 for the same labs.   A) Will these labs be sufficient for your visit in August?   B) If not, can patient complete labs here at [facility] because it is closer to her residence?   Please advise.   Thank you, Katha Cabal

## 2017-05-22 ENCOUNTER — Encounter: Payer: Self-pay | Admitting: Family Medicine

## 2017-05-22 ENCOUNTER — Ambulatory Visit (INDEPENDENT_AMBULATORY_CARE_PROVIDER_SITE_OTHER): Payer: PPO | Admitting: Family Medicine

## 2017-05-22 VITALS — BP 112/68 | HR 66 | Temp 97.7°F | Ht 63.0 in | Wt 140.0 lb

## 2017-05-22 DIAGNOSIS — Z9079 Acquired absence of other genital organ(s): Secondary | ICD-10-CM

## 2017-05-22 DIAGNOSIS — F419 Anxiety disorder, unspecified: Secondary | ICD-10-CM | POA: Diagnosis not present

## 2017-05-22 DIAGNOSIS — C50411 Malignant neoplasm of upper-outer quadrant of right female breast: Secondary | ICD-10-CM | POA: Diagnosis not present

## 2017-05-22 DIAGNOSIS — R5383 Other fatigue: Secondary | ICD-10-CM | POA: Insufficient documentation

## 2017-05-22 DIAGNOSIS — Z90722 Acquired absence of ovaries, bilateral: Secondary | ICD-10-CM

## 2017-05-22 DIAGNOSIS — E7800 Pure hypercholesterolemia, unspecified: Secondary | ICD-10-CM

## 2017-05-22 DIAGNOSIS — E039 Hypothyroidism, unspecified: Secondary | ICD-10-CM

## 2017-05-22 DIAGNOSIS — Z0000 Encounter for general adult medical examination without abnormal findings: Secondary | ICD-10-CM | POA: Diagnosis not present

## 2017-05-22 DIAGNOSIS — Z1211 Encounter for screening for malignant neoplasm of colon: Secondary | ICD-10-CM | POA: Diagnosis not present

## 2017-05-22 DIAGNOSIS — R5382 Chronic fatigue, unspecified: Secondary | ICD-10-CM

## 2017-05-22 DIAGNOSIS — Z90710 Acquired absence of both cervix and uterus: Secondary | ICD-10-CM | POA: Diagnosis not present

## 2017-05-22 DIAGNOSIS — M810 Age-related osteoporosis without current pathological fracture: Secondary | ICD-10-CM | POA: Diagnosis not present

## 2017-05-22 DIAGNOSIS — R0789 Other chest pain: Secondary | ICD-10-CM

## 2017-05-22 MED ORDER — CHLORTHALIDONE 50 MG PO TABS: 50.0000 mg | ORAL_TABLET | Freq: Every day | ORAL | 1 refills | Status: DC | PRN

## 2017-05-22 MED ORDER — ALENDRONATE SODIUM 70 MG PO TABS: ORAL_TABLET | ORAL | 3 refills | Status: DC

## 2017-05-22 MED ORDER — LEVOTHYROXINE SODIUM 100 MCG PO TABS: 100.0000 ug | ORAL_TABLET | Freq: Every day | ORAL | 3 refills | Status: DC

## 2017-05-22 MED ORDER — DICYCLOMINE HCL 10 MG PO CAPS: 10.0000 mg | ORAL_CAPSULE | Freq: Two times a day (BID) | ORAL | 2 refills | Status: DC | PRN

## 2017-05-22 NOTE — Progress Notes (Signed)
Subjective:    Patient ID: Gabrielle Webb, female    DOB: 08/14/1941, 76 y.o.   MRN: [id number]  HPI Here for health maintenance exam and to review chronic medical problems    Has not generally felt well since her surgery (hysterectomy 12/17)  occ she has some pain in L chest  Woke up with it once and took a baby asa  Has happened at rest at home / lasts just a few moments  It feels like a mild pain / dull - once it rad to L arm Not sob - but weak all over  Also deconditioned-not a lot of exercise due to her surgery and caring for her mother   EKG today NSR with rate of 66 -no acute changes   Also staying anxious all the time  Nervous  Not sad as a rule/ not tearful  Xanax prn- uses very sparingly  Stays busy in church/family  Declines counseling  Unsure if interested in addnl med     Had amw  Hearing : deaf in L ear baseline No concerns  Tetanus shot 1/07-she may have had one /unsure -will find out at pharmacy or at gyn  Pap 6/16 negative  She had endo cells in pap 2 y ago-then endo bx and then nl pap  She has had a hysterectomy in 2017 Has f/u with Dr Helane Rima in July Tamoxifen in the past  Mammogram 7/17-nl  Hx of breast cancer/ R lumpectomy and tamoxifen and radiation  Self breast exam  Sees Dr Jana Hakim in aug   Colonoscopy 8/14 small polyp- 5 y recall   dexa 12/16 OP (ordered by Dr Jana Hakim) On fosamax - tolerating it fine  No falls or fx  Takes her ca and d but not a lot of exercise    zostavax 08  Hypothyroidism  Pt has no clinical changes No change in energy level/ hair or skin/ edema and no tremor Lab Results  Component Value Date   TSH 1.18 05/20/2017     Hx of hyperlipidemia Lab Results  Component Value Date   CHOL 210 (H) 05/20/2017   CHOL 221 (H) 05/11/2016   CHOL 174 05/06/2015   Lab Results  Component Value Date   HDL 60.40 05/20/2017   HDL 64.30 05/11/2016   HDL 53.30 05/06/2015   Lab Results  Component Value Date   LDLCALC 128 (H) 05/20/2017   LDLCALC 137 (H) 05/11/2016   LDLCALC 99 05/06/2015   Lab Results  Component Value Date   TRIG 105.0 05/20/2017   TRIG 102.0 05/11/2016   TRIG 107.0 05/06/2015   Lab Results  Component Value Date   CHOLHDL 3 05/20/2017   CHOLHDL 3 05/11/2016   CHOLHDL 3 05/06/2015   Lab Results  Component Value Date   LDLDIRECT 142.0 03/19/2011   LDLDIRECT 125.4 12/08/2009   LDLDIRECT 172.0 09/19/2009  not a lot of exercise lately  Diet is fair/ does not eat bad  Red meat very infrequently     Results for orders placed or performed in visit on 05/20/17  CBC with Differential/Platelet  Result Value Ref Range   WBC 7.7 4.0 - 10.5 K/uL   RBC 4.72 3.87 - 5.11 Mil/uL   Hemoglobin 14.2 12.0 - 15.0 g/dL   HCT 42.5 36.0 - 46.0 %   MCV 89.9 78.0 - 100.0 fl   MCHC 33.4 30.0 - 36.0 g/dL   RDW 13.9 11.5 - 15.5 %   Platelets 204.0 150.0 - 400.0 K/uL  Neutrophils Relative % 53.4 43.0 - 77.0 %   Lymphocytes Relative 35.2 12.0 - 46.0 %   Monocytes Relative 8.7 3.0 - 12.0 %   Eosinophils Relative 2.4 0.0 - 5.0 %   Basophils Relative 0.3 0.0 - 3.0 %   Neutro Abs 4.1 1.4 - 7.7 K/uL   Lymphs Abs 2.7 0.7 - 4.0 K/uL   Monocytes Absolute 0.7 0.1 - 1.0 K/uL   Eosinophils Absolute 0.2 0.0 - 0.7 K/uL   Basophils Absolute 0.0 0.0 - 0.1 K/uL  Comprehensive metabolic panel  Result Value Ref Range   Sodium 139 135 - 145 mEq/L   Potassium 3.9 3.5 - 5.1 mEq/L   Chloride 103 96 - 112 mEq/L   CO2 31 19 - 32 mEq/L   Glucose, Bld 89 70 - 99 mg/dL   BUN 18 6 - 23 mg/dL   Creatinine, Ser 0.71 0.40 - 1.20 mg/dL   Total Bilirubin 0.5 0.2 - 1.2 mg/dL   Alkaline Phosphatase 59 39 - 117 U/L   AST 20 0 - 37 U/L   ALT 12 0 - 35 U/L   Total Protein 6.9 6.0 - 8.3 g/dL   Albumin 4.3 3.5 - 5.2 g/dL   Calcium 9.8 8.4 - 10.5 mg/dL   GFR 85.53 >60.00 mL/min  Lipid panel  Result Value Ref Range   Cholesterol 210 (H) 0 - 200 mg/dL   Triglycerides 105.0 0.0 - 149.0 mg/dL   HDL 60.40 >39.00  mg/dL   VLDL 21.0 0.0 - 40.0 mg/dL   LDL Cholesterol 128 (H) 0 - 99 mg/dL   Total CHOL/HDL Ratio 3    NonHDL 149.42   TSH  Result Value Ref Range   TSH 1.18 0.35 - 4.50 uIU/mL    Patient Active Problem List   Diagnosis Date Noted  . Fatigue 05/22/2017  . Chest discomfort 05/22/2017  . S/P TAH-BSO 11/08/2016  . Routine general medical examination at a health care facility 05/06/2016  . Breast cancer of upper-outer quadrant of right female breast ([place]) 03/21/2015  . History of tamoxifen therapy 05/18/2014  . Encounter for routine gynecological examination 05/06/2013  . Colon cancer screening 05/06/2013  . Encounter for Medicare annual wellness exam 04/28/2013  . Hx of radiation therapy   . Hematuria 09/12/2012  . Gynecological examination 12/25/2011  . Hx of Breast cancer, stage 1, Right, UOQ, Receptor+,Her2- 05/29/2011    Class: Stage 1  . Other screening mammogram 03/21/2011  . Post-menopausal 03/21/2011  . Hypothyroidism 01/23/2008  . Pure hypercholesterolemia 01/23/2008  . Anxious mood 01/23/2008  . ALLERGIC RHINITIS 01/23/2008  . GERD 01/23/2008  . DIVERTICULOSIS, COLON 01/23/2008  . IBS 01/23/2008  . FIBROCYSTIC BREAST DISEASE 01/23/2008  . Osteoporosis 10/17/2007   Past Medical History:  Diagnosis Date  . Allergy   . Anxiety   . Breast cancer ([place]) 7/12   Right  . Cellulitis   . Diverticulosis of colon   . GERD (gastroesophageal reflux disease)   . Headache    sinus  . History of hiatal hernia   . Hx of radiation therapy 06/20/11 - 07/11/11   right breast  . Hypothyroid   . Osteoporosis   . PONV (postoperative nausea and vomiting)   . Skin cancer    basal and squamous cell  . Vertigo    Past Surgical History:  Procedure Laterality Date  . ABDOMINAL HYSTERECTOMY N/A 11/08/2016   Procedure: HYSTERECTOMY TOTAL  ABDOMINAL;  Surgeon: Dian Queen, MD;  Location: [facility];  Service: Gynecology;  Laterality: N/A;  . APPENDECTOMY  07/2007  . back sugery  1970    . BREAST BIOPSY    . BREAST LUMPECTOMY Right 05/16/2011  . COLONOSCOPY    . DILATION AND CURETTAGE OF UTERUS N/A 05/31/2014   Procedure: DILATATION AND CURETTAGE WITH ULTRASOUND GUIDANCE;  Surgeon: Cyril Mourning, MD;  Location: [facility];  Service: Gynecology;  Laterality: N/A;  . [place]  . right eye surgery    . SALPINGOOPHORECTOMY Bilateral 11/08/2016   Procedure: BILATERAL SALPINGO OOPHORECTOMY;  Surgeon: Dian Queen, MD;  Location: [facility];  Service: Gynecology;  Laterality: Bilateral;  . UPPER GI ENDOSCOPY     Social History  Substance Use Topics  . Smoking status: Never Smoker  . Smokeless tobacco: Never Used  . Alcohol use No   Family History  Problem Relation Age of Onset  . Heart attack Father   . Hypertension Father   . Atrial fibrillation Mother   . Coronary artery disease Mother   . Diabetes Unknown        Grandmother  . Coronary artery disease Unknown        Grandmother  . Uterine cancer Unknown        Grandmother  . Leukemia Unknown        Aunt  . Cancer Maternal Aunt        leukemia  . Cancer Maternal Uncle        colon   Allergies  Allergen Reactions  . Codeine Nausea And Vomiting  . Fentanyl Nausea And Vomiting  . Hydromorphone Nausea And Vomiting       . Morphine Nausea And Vomiting  . Prednisone Palpitations    Rapid hear beat   Current Outpatient Prescriptions on File Prior to Visit  Medication Sig Dispense Refill  . ALPRAZolam (XANAX) 0.25 MG tablet TAKE ONE TABLET BY MOUTH AS NEEDED FOR ANXIETY OR SLEEP 30 tablet 0  . amoxicillin-clavulanate (AUGMENTIN) 875-125 MG tablet Take by mouth.    Marland Kitchen aspirin 81 MG tablet Take 81 mg by mouth daily.     . benzonatate (TESSALON) 200 MG capsule Take by mouth.    . Cholecalciferol (VITAMIN D) 2000 UNITS CAPS Take 1 capsule by mouth daily.    . diclofenac sodium (VOLTAREN) 1 % GEL Apply 2 g topically 4 (four) times daily. Rub into affected area of foot 2 to 4 times daily (Patient taking  differently: Apply 2 g topically 4 (four) times daily. Rub into affected area of foot 2 to 4 times daily as needed pain) 100 g 2  . fluticasone (FLONASE) 50 MCG/ACT nasal spray 2 SPRAYS IN EACH NOSTRIL EVERY DAY 16 g 11  . Multiple Vitamin (MULTIVITAMIN) capsule Take 1 capsule by mouth daily.      . Naproxen Sodium (ALEVE PO) Take 1 tablet by mouth as needed.    . niacin 500 MG tablet Take 1,000 mg by mouth at bedtime.     Marland Kitchen OVER THE COUNTER MEDICATION Take 1 tablet by mouth daily as needed (allergies). Over the counter allergy medication    . ranitidine (ZANTAC) 150 MG tablet Take 150 mg by mouth as needed for heartburn.      No current facility-administered medications on file prior to visit.      Review of Systems    Review of Systems  Constitutional: Negative for fever, appetite change, fatigue and unexpected weight change.  Eyes: Negative for pain and visual disturbance.  Respiratory: Negative for cough and shortness of breath.  Cardiovascular: Negative for pedal edema/ pnd/ orthopnea or palpitations    Gastrointestinal: Negative for nausea, diarrhea and constipation.  Genitourinary: Negative for urgency and frequency.  Skin: Negative for pallor or rash   Neurological: Negative for weakness, light-headedness, numbness and headaches.  Hematological: Negative for adenopathy. Does not bruise/bleed easily.  Psychiatric/Behavioral: Negative for dysphoric mood.  Pos for anxious mood with caregiver stress     Objective:   Physical Exam  Constitutional: She appears well-developed and well-nourished. No distress.  Well appearing   HENT:  Head: Normocephalic and atraumatic.  Right Ear: External ear normal.  Left Ear: External ear normal.  Mouth/Throat: Oropharynx is clear and moist.  Eyes: Conjunctivae and EOM are normal. Pupils are equal, round, and reactive to light. No scleral icterus.  Neck: Normal range of motion. Neck supple. No JVD present. Carotid bruit is not present. No  thyromegaly present.  Cardiovascular: Normal rate, regular rhythm, normal heart sounds and intact distal pulses.  Exam reveals no gallop.   Pulmonary/Chest: Effort normal and breath sounds normal. No respiratory distress. She has no wheezes. She exhibits no tenderness.  Abdominal: Soft. Bowel sounds are normal. She exhibits no distension, no abdominal bruit and no mass. There is no tenderness.  Genitourinary: No breast swelling, tenderness, discharge or bleeding.  Genitourinary Comments: Breast exam: No mass, nodules, thickening, tenderness, bulging, retraction, inflamation, nipple discharge or skin changes noted.  No axillary or clavicular LA.    Baseline surgical changes in R breast   Musculoskeletal: Normal range of motion. She exhibits no edema or tenderness.  Lymphadenopathy:    She has no cervical adenopathy.  Neurological: She is alert. She has normal reflexes. No cranial nerve deficit. She exhibits normal muscle tone. Coordination normal.  Skin: Skin is warm and dry. No rash noted. No erythema. No pallor.  Solar lentigines diffusely    Psychiatric: Her speech is normal and behavior is normal. Thought content normal. Her mood appears anxious. Her affect is not blunt and not labile. Cognition and memory are normal. She does not exhibit a depressed mood.  Pt seems generally fatigued and stressed Mildly anxious  Good insight          Assessment & Plan:   Problem List Items Addressed This Visit      Endocrine   Hypothyroidism    Hypothyroidism  Pt has no clinical changes besides fatigue No change in  hair or skin/ edema and no tremor Lab Results  Component Value Date   TSH 1.18 05/20/2017          Relevant Medications   levothyroxine (SYNTHROID, LEVOTHROID) 100 MCG tablet     Musculoskeletal and Integument   Osteoporosis    Followed by oncology On fosamax Due  dexa 12/18 No falls or fx On ca and D Past-had femara and also tamoxifen      Relevant Medications    alendronate (FOSAMAX) 70 MG tablet     Other   Anxious mood    Suspect exac by caregiver stress and her own medical problems  Reviewed stressors/ coping techniques/symptoms/ support sources/ tx options and side effects in detail today Xanax prn  She does not want addn med at this time Declines counseling Enc finding time for self care       Breast cancer of upper-outer quadrant of right female breast ([place])    Off tamoxifen/femara Watching bone density  For oncol f/u and mm this summer      Chest discomfort    Atypical  EKG  NSR rate 66 no acute changes fam hx of CAD Fair cholesterol  Ref to cardiology       Relevant Orders   EKG 12-Lead (Completed)   Ambulatory referral to Cardiology   Colon cancer screening    Polyp 2014- 5 y recall      Fatigue    New -with c/o of gen weakness Since her surgery in dec  Labs are ok  Some anxiety and caregiver stress      Pure hypercholesterolemia    Disc goals for lipids and reasons to control them Rev labs with pt Rev low sat fat diet in detail HDL is 60s LDL is uner 130 Watch closely      Relevant Medications   chlorthalidone (HYGROTON) 50 MG tablet   Routine general medical examination at a health care facility - Primary    Reviewed health habits including diet and exercise and skin cancer prevention Reviewed appropriate screening tests for age  Also reviewed health mt list, fam hx and immunization status , as well as social and family history   See HPI Rev AMW She will see if she is utd on tetanus shot  F/u for mammo and oncol and gyn upcoming  Disc caregiver stress  Labs rev      S/P TAH-BSO    For thick endometrium in setting of breast cancer tx Pt has c/o fatigue since surgery in dec  F/u with gyn is next mo

## 2017-05-22 NOTE — Assessment & Plan Note (Signed)
For thick endometrium in setting of breast cancer tx Pt has c/o fatigue since surgery in dec  F/u with gyn is next mo

## 2017-05-22 NOTE — Patient Instructions (Addendum)
Find out if you have had a tetanus shot since 07  Follow up with Dr Helane Rima in July as planned   Don't forget to schedule your mammogram   We will refer you to cardiology  EKG is reassuring today   Let me know if you want to take additional medicine for anxiety   For cholesterol  : Avoid red meat/ fried foods/ egg yolks/ fatty breakfast meats/ butter, cheese and high fat dairy/ and shellfish

## 2017-05-22 NOTE — Assessment & Plan Note (Signed)
Polyp 2014- 5 y recall

## 2017-05-22 NOTE — Assessment & Plan Note (Signed)
Reviewed health habits including diet and exercise and skin cancer prevention Reviewed appropriate screening tests for age  Also reviewed health mt list, fam hx and immunization status , as well as social and family history   See HPI Rev AMW She will see if she is utd on tetanus shot  F/u for mammo and oncol and gyn upcoming  Disc caregiver stress  Labs rev

## 2017-05-22 NOTE — Assessment & Plan Note (Signed)
Hypothyroidism  Pt has no clinical changes besides fatigue No change in  hair or skin/ edema and no tremor Lab Results  Component Value Date   TSH 1.18 05/20/2017

## 2017-05-22 NOTE — Assessment & Plan Note (Signed)
Disc goals for lipids and reasons to control them Rev labs with pt Rev low sat fat diet in detail HDL is 60s LDL is uner 130 Watch closely

## 2017-05-22 NOTE — Assessment & Plan Note (Signed)
Suspect exac by caregiver stress and her own medical problems  Reviewed stressors/ coping techniques/symptoms/ support sources/ tx options and side effects in detail today Xanax prn  She does not want addn med at this time Declines counseling Enc finding time for self care

## 2017-05-22 NOTE — Assessment & Plan Note (Signed)
Atypical  EKG NSR rate 66 no acute changes fam hx of CAD Fair cholesterol  Ref to cardiology

## 2017-05-22 NOTE — Assessment & Plan Note (Signed)
New -with c/o of gen weakness Since her surgery in dec  Labs are ok  Some anxiety and caregiver stress

## 2017-05-22 NOTE — Assessment & Plan Note (Signed)
Followed by oncology On fosamax Due  dexa 12/18 No falls or fx On ca and D Past-had femara and also tamoxifen

## 2017-05-22 NOTE — Assessment & Plan Note (Signed)
Off tamoxifen/femara Watching bone density  For oncol f/u and mm this summer

## 2017-05-24 ENCOUNTER — Telehealth: Payer: Self-pay | Admitting: Oncology

## 2017-05-24 NOTE — Telephone Encounter (Signed)
sw pt to inform of cxl lab appt 8/1 per sch msg

## 2017-06-03 ENCOUNTER — Other Ambulatory Visit: Payer: Self-pay

## 2017-06-03 ENCOUNTER — Other Ambulatory Visit: Payer: Self-pay | Admitting: Oncology

## 2017-06-03 DIAGNOSIS — Z853 Personal history of malignant neoplasm of breast: Secondary | ICD-10-CM

## 2017-06-06 DIAGNOSIS — Z124 Encounter for screening for malignant neoplasm of cervix: Secondary | ICD-10-CM | POA: Diagnosis not present

## 2017-06-06 DIAGNOSIS — Z1272 Encounter for screening for malignant neoplasm of vagina: Secondary | ICD-10-CM | POA: Diagnosis not present

## 2017-06-06 DIAGNOSIS — Z6824 Body mass index (BMI) 24.0-24.9, adult: Secondary | ICD-10-CM | POA: Diagnosis not present

## 2017-06-14 NOTE — Progress Notes (Signed)
Cardiology Office Note  Date:  06/17/2017   ID:  Gabrielle Webb, Gabrielle Webb 06/02/1941, MRN [id number]  PCP:  Abner Greenspan, MD   Chief Complaint  Patient presents with  . other    Ref by Dr. Glori Bickers for chest pain. Meds reviewed  by the pt. verbally. Pt. c/o shortness of breath, fatigue and chest pain that radiates to the left arm.     HPI:  Ms. Stauffer is a pleasant 76 year old woman with history of Anxiety, uses Xanax as needed Nonsmoker Family history of coronary artery disease History of breast cancer on the right, XRT on the right, lumpectomy Who presents by referral from Dr. Glori Bickers for evaluation of chest pain and shortness of breath symptoms  She reports having occasional episodes of left-sided chest pain, Recently woke up with one episode took a baby asa  with improvement of her symptoms Has happened at rest at home, short in duration Pain around the left breast, above and the pectoral region No significant shortness of breath, Feels weak ever since her hysterectomy Wonders if it was from the anesthesia No regular exercise, caring for her mother  Fatigue, caregiver stress Notes indicating history of anxiety, nervous Busy in church with family  EKG personally reviewed by myself on todays visit Shows NSR with rate of 76 no acute changes   Lab work reviewed with her in detail Total chol 210, LDL 128     PMH:   has a past medical history of Allergy; Anxiety; Breast cancer ([place]) (7/12); Cellulitis; Diverticulosis of colon; GERD (gastroesophageal reflux disease); Headache; History of hiatal hernia; radiation therapy (06/20/11 - 07/11/11); Hypothyroid; Osteoporosis; PONV (postoperative nausea and vomiting); Skin cancer; and Vertigo.  PSH:    Past Surgical History:  Procedure Laterality Date  . ABDOMINAL HYSTERECTOMY N/A 11/08/2016   Procedure: HYSTERECTOMY TOTAL  ABDOMINAL;  Surgeon: Dian Queen, MD;  Location: [facility];  Service: Gynecology;  Laterality: N/A;  .  APPENDECTOMY  07/2007  . back sugery  1970  . BREAST BIOPSY    . BREAST LUMPECTOMY Right 05/16/2011  . COLONOSCOPY    . DILATION AND CURETTAGE OF UTERUS N/A 05/31/2014   Procedure: DILATATION AND CURETTAGE WITH ULTRASOUND GUIDANCE;  Surgeon: Cyril Mourning, MD;  Location: [facility];  Service: Gynecology;  Laterality: N/A;  . [place]  . right eye surgery    . SALPINGOOPHORECTOMY Bilateral 11/08/2016   Procedure: BILATERAL SALPINGO OOPHORECTOMY;  Surgeon: Dian Queen, MD;  Location: [facility];  Service: Gynecology;  Laterality: Bilateral;  . UPPER GI ENDOSCOPY      Current Outpatient Prescriptions  Medication Sig Dispense Refill  . alendronate (FOSAMAX) 70 MG tablet ONE TABLET BY MOUTH EVERY 7 DAYS TAKE WITH FULL GLASS OF WATER DO NOTLIE DOWNFOR THE NEXT 30 MIN 12 tablet 3  . ALPRAZolam (XANAX) 0.25 MG tablet TAKE ONE TABLET BY MOUTH AS NEEDED FOR ANXIETY OR SLEEP 30 tablet 0  . aspirin 81 MG tablet Take 81 mg by mouth daily.     . chlorthalidone (HYGROTON) 50 MG tablet Take 1 tablet (50 mg total) by mouth daily as needed. For swelling. 90 tablet 1  . Cholecalciferol (VITAMIN D) 2000 UNITS CAPS Take 1 capsule by mouth daily.    . diclofenac sodium (VOLTAREN) 1 % GEL Apply 2 g topically 4 (four) times daily. Rub into affected area of foot 2 to 4 times daily (Patient taking differently: Apply 2 g topically 4 (four) times daily. Rub into affected area of foot 2  to 4 times daily as needed pain) 100 g 2  . dicyclomine (BENTYL) 10 MG capsule Take 1 capsule (10 mg total) by mouth 2 (two) times daily as needed for spasms. 30 capsule 2  . fluticasone (FLONASE) 50 MCG/ACT nasal spray 2 SPRAYS IN EACH NOSTRIL EVERY DAY 16 g 11  . levothyroxine (SYNTHROID, LEVOTHROID) 100 MCG tablet Take 1 tablet (100 mcg total) by mouth daily. 90 tablet 3  . Multiple Vitamin (MULTIVITAMIN) capsule Take 1 capsule by mouth daily.      . Naproxen Sodium (ALEVE PO) Take 1 tablet by mouth as needed.    . niacin 500  MG tablet Take 1,000 mg by mouth at bedtime.     Marland Kitchen OVER THE COUNTER MEDICATION Take 1 tablet by mouth daily as needed (allergies). Over the counter allergy medication    . ranitidine (ZANTAC) 150 MG tablet Take 150 mg by mouth as needed for heartburn.      No current facility-administered medications for this visit.      Allergies:   Codeine; Fentanyl; Hydromorphone; Morphine; and Prednisone   Social History:  The patient  reports that she has never smoked. She has never used smokeless tobacco. She reports that she does not drink alcohol or use drugs.   Family History:   family history includes Atrial fibrillation in her mother; Cancer in her maternal aunt and maternal uncle; Coronary artery disease in her mother and unknown relative; Diabetes in her unknown relative; Heart attack (age of onset: 61) in her father; Hypertension in her father; Leukemia in her unknown relative; Uterine cancer in her unknown relative.    Review of Systems: Review of Systems  Constitutional: Negative.   Respiratory: Positive for shortness of breath.   Cardiovascular: Positive for chest pain.  Gastrointestinal: Negative.   Musculoskeletal: Negative.   Neurological: Negative.   Psychiatric/Behavioral: The patient is nervous/anxious.   All other systems reviewed and are negative.    PHYSICAL EXAM: VS:  BP 110/78 (BP Location: Left Arm, Patient Position: Sitting, Cuff Size: Normal)   Pulse 76   Ht 5\' 3"  (1.6 m)   Wt 139 lb 8 oz (63.3 kg)   LMP 12/03/1988   BMI 24.71 kg/m  , BMI Body mass index is 24.71 kg/m. GEN: Well nourished, well developed, in no acute distress  HEENT: normal  Neck: no JVD, carotid bruits, or masses Cardiac: RRR; no murmurs, rubs, or gallops,no edema  Respiratory:  clear to auscultation bilaterally, normal work of breathing GI: soft, nontender, nondistended, + BS MS: no deformity or atrophy  Skin: warm and dry, no rash Neuro:  Strength and sensation are intact Psych: euthymic  mood, full affect    Recent Labs: 05/20/2017: ALT 12; BUN 18; Creatinine, Ser 0.71; Hemoglobin 14.2; Platelets 204.0; Potassium 3.9; Sodium 139; TSH 1.18    Lipid Panel Lab Results  Component Value Date   CHOL 210 (H) 05/20/2017   HDL 60.40 05/20/2017   LDLCALC 128 (H) 05/20/2017   TRIG 105.0 05/20/2017      Wt Readings from Last 3 Encounters:  06/17/17 139 lb 8 oz (63.3 kg)  05/22/17 140 lb (63.5 kg)  05/20/17 138 lb 8 oz (62.8 kg)       ASSESSMENT AND PLAN:  Chest discomfort - Plan: EKG 12-Lead, CT CARDIAC SCORING Atypical in nature Few risk factors, nonsmoker, no diabetes Cholesterol recently well controlled until the past 2 years Discussed various options for her including stress testing, other risk stratification testing such as CT coronary  calcium scoring She prefers to order CT coronary calcium score.  If scores low, would likely be low risk of underlying ischemia Markedly elevated score would likely need additional workup  Chronic fatigue - Plan: EKG 12-Lead Was previously walking on a regular basis with good energy, now no longer doing any exercise Recommended she restart her walking program  Anxious mood - Plan: EKG 12-Lead Significant caretaker burnout Takes care of mother.  Pure hypercholesterolemia - Plan: EKG 12-Lead For now will not start a cholesterol medication unless CT coronary calcium score is elevated Recommended she try to restart her regular walking program  Disposition:   F/U  As needed We will call her with the results of her CT coronary calcium score   Total encounter time more than 45 minutes  Greater than 50% was spent in counseling and coordination of care with the patient    Orders Placed This Encounter  Procedures  . CT CARDIAC SCORING  . EKG 12-Lead     Signed, Esmond Plants, M.D., Ph.D. 06/17/2017  [place], [place]

## 2017-06-17 ENCOUNTER — Encounter: Payer: Self-pay | Admitting: Cardiovascular Disease

## 2017-06-17 ENCOUNTER — Telehealth: Payer: Self-pay | Admitting: Cardiovascular Disease

## 2017-06-17 ENCOUNTER — Ambulatory Visit (INDEPENDENT_AMBULATORY_CARE_PROVIDER_SITE_OTHER): Payer: PPO | Admitting: Cardiovascular Disease

## 2017-06-17 VITALS — BP 110/78 | HR 76 | Ht 63.0 in | Wt 139.5 lb

## 2017-06-17 DIAGNOSIS — E7800 Pure hypercholesterolemia, unspecified: Secondary | ICD-10-CM

## 2017-06-17 DIAGNOSIS — R5382 Chronic fatigue, unspecified: Secondary | ICD-10-CM

## 2017-06-17 DIAGNOSIS — R0789 Other chest pain: Secondary | ICD-10-CM | POA: Diagnosis not present

## 2017-06-17 DIAGNOSIS — F419 Anxiety disorder, unspecified: Secondary | ICD-10-CM

## 2017-06-17 NOTE — Patient Instructions (Signed)
Medication Instructions:   No medication changes made  Labwork:  No new labs needed  Testing/Procedures:  We will order a CT coronary calcium score $150 For chest pain   Follow-Up: It was a pleasure seeing you in the office today. Please call us if you have new issues that need to be addressed before your next appt.  [phone]  Your physician wants you to follow-up in:  As needed  If you need a refill on your cardiac medications before your next appointment, please call your pharmacy.

## 2017-06-17 NOTE — Telephone Encounter (Signed)
Pt would like records sent to [facility] Dr Helane Rima

## 2017-06-18 NOTE — Telephone Encounter (Signed)
Office visit note with Dr Rockey Situ from 06/18/17 routed via EPIC to Dr Helane Rima as patient requested.

## 2017-06-21 ENCOUNTER — Ambulatory Visit (INDEPENDENT_AMBULATORY_CARE_PROVIDER_SITE_OTHER)
Admission: RE | Admit: 2017-06-21 | Discharge: 2017-06-21 | Disposition: A | Discharge status: Home / Self Care | Payer: Self-pay | Source: Ambulatory Visit | Attending: Cardiovascular Disease | Admitting: Cardiovascular Disease

## 2017-06-21 DIAGNOSIS — R0789 Other chest pain: Secondary | ICD-10-CM

## 2017-06-24 ENCOUNTER — Telehealth: Payer: Self-pay | Admitting: Cardiovascular Disease

## 2017-06-24 NOTE — Telephone Encounter (Signed)
Pt calling wanting to know results from CT Scan she had in [place] Please give a call back on cell

## 2017-06-25 NOTE — Telephone Encounter (Signed)
Please see CT calcium score result note.  Pt viewed results via MyChart and is concerned about incidental findings.  Forwarded to PCP, oncologist and women's doctor.

## 2017-06-28 ENCOUNTER — Ambulatory Visit
Admission: RE | Admit: 2017-06-28 | Discharge: 2017-06-28 | Disposition: A | Discharge status: Home / Self Care | Payer: PPO | Source: Ambulatory Visit | Attending: Oncology | Admitting: Oncology

## 2017-06-28 DIAGNOSIS — Z853 Personal history of malignant neoplasm of breast: Secondary | ICD-10-CM

## 2017-06-28 DIAGNOSIS — R922 Inconclusive mammogram: Secondary | ICD-10-CM | POA: Diagnosis not present

## 2017-06-28 HISTORY — DX: Personal history of irradiation: Z92.3

## 2017-07-03 ENCOUNTER — Other Ambulatory Visit: Payer: PPO

## 2017-07-10 ENCOUNTER — Ambulatory Visit (HOSPITAL_BASED_OUTPATIENT_CLINIC_OR_DEPARTMENT_OTHER): Payer: PPO | Admitting: Oncology

## 2017-07-10 ENCOUNTER — Telehealth: Payer: Self-pay | Admitting: Oncology

## 2017-07-10 VITALS — BP 105/59 | HR 79 | Temp 97.7°F | Resp 18 | Ht 63.0 in | Wt 139.3 lb

## 2017-07-10 DIAGNOSIS — E7800 Pure hypercholesterolemia, unspecified: Secondary | ICD-10-CM

## 2017-07-10 DIAGNOSIS — Z90710 Acquired absence of both cervix and uterus: Secondary | ICD-10-CM

## 2017-07-10 DIAGNOSIS — M810 Age-related osteoporosis without current pathological fracture: Secondary | ICD-10-CM | POA: Diagnosis not present

## 2017-07-10 DIAGNOSIS — C50411 Malignant neoplasm of upper-outer quadrant of right female breast: Secondary | ICD-10-CM

## 2017-07-10 DIAGNOSIS — Z853 Personal history of malignant neoplasm of breast: Secondary | ICD-10-CM | POA: Diagnosis not present

## 2017-07-10 DIAGNOSIS — Z170 Estrogen receptor positive status [ER+]: Principal | ICD-10-CM

## 2017-07-10 NOTE — Progress Notes (Signed)
[place]  [phone] [phone]     ID: ONEAL BIGLOW DOB: 1941-10-19  MR#: [id number]  JFH#:[id number]  Patient Care Team: Abner Greenspan, MD as PCP - General Magrinat, Virgie Dad, MD as Consulting Physician (Oncology) Dian Queen, MD as Consulting Physician (Obstetrics and Gynecology) Eula Flax, OD as Referring Physician (Ophthalmology) Clyde Canterbury, MD as Referring Physician (Otolaryngology) Oneta Rack, MD as Consulting Physician (Dermatology) Ocie Cornfield, DDS as Consulting Physician (Dentistry) Rockey Situ, Kathlene November, MD as Consulting Physician (Cardiology) PCP: Abner Greenspan, MD OTHER MD: Deitra Mayo MD  CHIEF COMPLAINT: Estrogen receptor positive breast cancer  CURRENT TREATMENT:   BREAST CANCER HISTORY: From Dr. Eusebio Me initial intake note 05/10/2011:  "Ms. Fendrick is a pleasant 76 year old female.  She underwent a screening mammogram on 04/20/2011 and was found to have an asymmetric spiculated mass in the upper outer quadrant.  Ultrasound confirmed the presence of this mass, and a biopsy on 05/02/2011 revealed a low-grade invasive ductal carcinoma.  This measured 11 mm and was ER, PR positive, HER2 negative and Ki-67 was 15%.  An MRI of the bilateral breasts was performed on 05/07/2011.  This showed a 1.5 x 1.0 cm mass.  Ms. Dematteo notes a single episode of possible stinging in her right breast prior to her mammogram.  She has a long history of fibrocystic disease though, so she did not feel anything was greatly out of the ordinary.  Interestingly, she did have a history of several breast biopsies and excisions in 1987 and 1988.  She brings these old clinic records with her, and it looks like some fibrocystic disease was removed as well as an area of atypical cells.  The differential is quite broad, but it is possible given that she has developed this in a very similar area that this was LCIS.  Regardless, at that  time when she had those biopsies, she had been on hormone replacement therapy for about a month and took herself off."  Her subsequent history is as detailed below  INTERVAL HISTORY: Kameren returns today for follow-up of her history of estrogen receptor positive breast cancer. She is currently under observation alone. Since her last visit here she underwent a total abdominal hysterectomy with bilateral salpingo-oophorectomy under Dr. Helane Rima  11/08/2016. The patient had a thickened endometrium, and cervical stenosis, with her history of breast cancer as indications. Pathology was benign. She did well with the surgery but has not yet been able to get back to her usual exercise routine and as a result is concerned that she is gaining some weight.  She also had coronary artery calcification studies which were very favorable.   REVIEW OF SYSTEMS: Janeann continues to be very involved in the care of her mother, who has a significant history of congestive heart failure at age 57. The mother has a DO NOT RESUSCITATE in place and so does the patient. She has some variable abdominal symptoms which she attributes to a spastic gallbladder. A detailed review of systems today was otherwise stable  PAST MEDICAL HISTORY: Past Medical History:  Diagnosis Date  . Allergy   . Anxiety   . Breast cancer ([place]) 7/12   Right  . Cellulitis   . Diverticulosis of colon   . GERD (gastroesophageal reflux disease)   . Headache    sinus  . History of hiatal hernia   . Hx of radiation therapy 06/20/11 - 07/11/11   right breast  . Hypothyroid   . Osteoporosis   .  Personal history of radiation therapy 2012   Right Breast Cancer  . PONV (postoperative nausea and vomiting)   . Skin cancer    basal and squamous cell  . Vertigo     PAST SURGICAL HISTORY: Past Surgical History:  Procedure Laterality Date  . ABDOMINAL HYSTERECTOMY N/A 11/08/2016   Procedure: HYSTERECTOMY TOTAL  ABDOMINAL;  Surgeon: Dian Queen,  MD;  Location: [facility];  Service: Gynecology;  Laterality: N/A;  . APPENDECTOMY  07/2007  . back sugery  1970  . BREAST BIOPSY Left    Benign  . BREAST EXCISIONAL BIOPSY Left 1983  . BREAST EXCISIONAL BIOPSY Left 1983  . BREAST LUMPECTOMY Right 05/16/2011  . COLONOSCOPY    . DILATION AND CURETTAGE OF UTERUS N/A 05/31/2014   Procedure: DILATATION AND CURETTAGE WITH ULTRASOUND GUIDANCE;  Surgeon: Cyril Mourning, MD;  Location: [facility];  Service: Gynecology;  Laterality: N/A;  . [place]  . right eye surgery    . SALPINGOOPHORECTOMY Bilateral 11/08/2016   Procedure: BILATERAL SALPINGO OOPHORECTOMY;  Surgeon: Dian Queen, MD;  Location: [facility];  Service: Gynecology;  Laterality: Bilateral;  . UPPER GI ENDOSCOPY      FAMILY HISTORY Family History  Problem Relation Age of Onset  . Heart attack Father 31  . Hypertension Father   . Atrial fibrillation Mother   . Coronary artery disease Mother   . Diabetes Unknown        Grandmother  . Coronary artery disease Unknown        Grandmother  . Uterine cancer Unknown        Grandmother  . Leukemia Unknown        Aunt  . Cancer Maternal Aunt        leukemia  . Cancer Maternal Uncle        colon   the patient's father died at the age of 66 from a myocardial infarction. The patient's mother is 41 years old currently. She has atrial fibrillation. She lives at [facility]. The patient is an only child. There is no history of breast or ovarian cancer in the family to her knowledge  GYNECOLOGIC HISTORY:  Patient's last menstrual period was 12/03/1988. Menarche age 50 first live birth age 28. She stopped having periods in her late 36s. 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 Her husband died from widely metastatic cancer of unknown primary (it was never biopsied) approximately a year ago. She lives alone in a [place], with 2 cats. Her  son [name] "[name] "Careers adviser lives in [place]. He works out of his home for [employer]. The patient has 3 grandchildren, the oldest 79, 61, his micro-cephalic, the others are 55 and 74 years old. The patient attends a local church in [place]: In place. The patient's son [name] "[name] " [name] is her healthcare power of attorney. He can be reached at [phone].   HEALTH MAINTENANCE: Social History  Substance Use Topics  . Smoking status: Never Smoker  . Smokeless tobacco: Never Used  . Alcohol use No     Colonoscopy:  PAP:  Bone density:  Lipid panel:  Allergies  Allergen Reactions  . Codeine Nausea And Vomiting  . Fentanyl Nausea And Vomiting  . Hydromorphone Nausea And Vomiting       . Morphine Nausea And Vomiting  . Prednisone Palpitations    Rapid hear beat  Current Outpatient Prescriptions  Medication Sig Dispense Refill  . alendronate (FOSAMAX) 70 MG tablet ONE TABLET BY MOUTH EVERY 7 DAYS TAKE WITH FULL GLASS OF WATER DO NOTLIE DOWNFOR THE NEXT 30 MIN 12 tablet 3  . ALPRAZolam (XANAX) 0.25 MG tablet TAKE ONE TABLET BY MOUTH AS NEEDED FOR ANXIETY OR SLEEP 30 tablet 0  . aspirin 81 MG tablet Take 81 mg by mouth daily.     . chlorthalidone (HYGROTON) 50 MG tablet Take 1 tablet (50 mg total) by mouth daily as needed. For swelling. 90 tablet 1  . Cholecalciferol (VITAMIN D) 2000 UNITS CAPS Take 1 capsule by mouth daily.    . diclofenac sodium (VOLTAREN) 1 % GEL Apply 2 g topically 4 (four) times daily. Rub into affected area of foot 2 to 4 times daily (Patient taking differently: Apply 2 g topically 4 (four) times daily. Rub into affected area of foot 2 to 4 times daily as needed pain) 100 g 2  . dicyclomine (BENTYL) 10 MG capsule Take 1 capsule (10 mg total) by mouth 2 (two) times daily as needed for spasms. 30 capsule 2  . fluticasone (FLONASE) 50 MCG/ACT nasal spray 2 SPRAYS IN EACH NOSTRIL EVERY DAY 16 g 11  . levothyroxine (SYNTHROID,  LEVOTHROID) 100 MCG tablet Take 1 tablet (100 mcg total) by mouth daily. 90 tablet 3  . Multiple Vitamin (MULTIVITAMIN) capsule Take 1 capsule by mouth daily.      . Naproxen Sodium (ALEVE PO) Take 1 tablet by mouth as needed.    . niacin 500 MG tablet Take 1,000 mg by mouth at bedtime.     Marland Kitchen OVER THE COUNTER MEDICATION Take 1 tablet by mouth daily as needed (allergies). Over the counter allergy medication    . ranitidine (ZANTAC) 150 MG tablet Take 150 mg by mouth as needed for heartburn.      No current facility-administered medications for this visit.     OBJECTIVE: Middle-aged white womanIn no acute distress  Vitals:   07/10/17 1214  BP: (!) 105/59  Pulse: 79  Resp: 18  Temp: 97.7 F (36.5 C)     Body mass index is 24.68 kg/m.    ECOG FS:0 - Asymptomatic  Sclerae unicteric, EOMs intact Oropharynx clear and moist No cervical or supraclavicular adenopathy Lungs no rales or rhonchi Heart regular rate and rhythm Abd soft, nontender, positive bowel sounds MSK no focal spinal tenderness, no upper extremity lymphedema Neuro: nonfocal, well oriented, appropriate affect Breasts: The right breast is status post lumpectomy followed by radiation with no evidence of local recurrence. The left breast is benign. Both axillae are benign.  LAB RESULTS:  CMP     Component Value Date/Time   NA 139 05/20/2017 1018   NA 142 07/03/2016 0957   K 3.9 05/20/2017 1018   K 4.1 07/03/2016 0957   CL 103 05/20/2017 1018   CL 109 (H) 09/18/2012 0849   CO2 31 05/20/2017 1018   CO2 27 07/03/2016 0957   GLUCOSE 89 05/20/2017 1018   GLUCOSE 86 07/03/2016 0957   GLUCOSE 79 09/18/2012 0849   BUN 18 05/20/2017 1018   BUN 11.8 07/03/2016 0957   CREATININE 0.71 05/20/2017 1018   CREATININE 0.7 07/03/2016 0957   CALCIUM 9.8 05/20/2017 1018   CALCIUM 9.4 07/03/2016 0957   PROT 6.9 05/20/2017 1018   PROT 6.7 07/03/2016 0957   ALBUMIN 4.3 05/20/2017 1018   ALBUMIN 3.8 07/03/2016 0957   AST 20  05/20/2017 1018   AST  19 07/03/2016 0957   ALT 12 05/20/2017 1018   ALT 12 07/03/2016 0957   ALKPHOS 59 05/20/2017 1018   ALKPHOS 55 07/03/2016 0957   BILITOT 0.5 05/20/2017 1018   BILITOT 0.57 07/03/2016 0957   GFRNONAA >60 11/09/2016 0622   GFRAA >60 11/09/2016 0622    INo results found for: SPEP, UPEP  Lab Results  Component Value Date   WBC 7.7 05/20/2017   NEUTROABS 4.1 05/20/2017   HGB 14.2 05/20/2017   HCT 42.5 05/20/2017   MCV 89.9 05/20/2017   PLT 204.0 05/20/2017      Chemistry      Component Value Date/Time   NA 139 05/20/2017 1018   NA 142 07/03/2016 0957   K 3.9 05/20/2017 1018   K 4.1 07/03/2016 0957   CL 103 05/20/2017 1018   CL 109 (H) 09/18/2012 0849   CO2 31 05/20/2017 1018   CO2 27 07/03/2016 0957   BUN 18 05/20/2017 1018   BUN 11.8 07/03/2016 0957   CREATININE 0.71 05/20/2017 1018   CREATININE 0.7 07/03/2016 0957      Component Value Date/Time   CALCIUM 9.8 05/20/2017 1018   CALCIUM 9.4 07/03/2016 0957   ALKPHOS 59 05/20/2017 1018   ALKPHOS 55 07/03/2016 0957   AST 20 05/20/2017 1018   AST 19 07/03/2016 0957   ALT 12 05/20/2017 1018   ALT 12 07/03/2016 0957   BILITOT 0.5 05/20/2017 1018   BILITOT 0.57 07/03/2016 0957       Lab Results  Component Value Date   LABCA2 17 05/09/2011    No components found for: [id number]  No results for input(s): INR in the last 168 hours.  Urinalysis    Component Value Date/Time   BILIRUBINUR 1+ 06/25/2014 1208   PROTEINUR 30+ 06/25/2014 1208   UROBILINOGEN 0.2 06/25/2014 1208   NITRITE neg. 06/25/2014 1208   LEUKOCYTESUR moderate (2+) 06/25/2014 1208    STUDIES: Ct Cardiac Scoring  Addendum Date: 06/21/2017   ADDENDUM REPORT: 06/21/2017 11:51 CLINICAL DATA:  Risk stratification EXAM: Coronary Calcium Score TECHNIQUE: The patient was scanned on a Siemens Somatom 64 slice scanner. Axial non-contrast 3 mm slices were carried out through the heart. The data set was analyzed on a dedicated work  station and scored using the [place]. FINDINGS: Non-cardiac: See separate report from [facility]. Ascending Aorta:  Normal size, no calcifications. Pericardium: Normal. Coronary arteries:  Normal origin. IMPRESSION: Coronary calcium score of 0. This was 0 percentile for age and sex matched control. Ena Dawley Electronically Signed   By: Ena Dawley   On: 06/21/2017 11:51   Result Date: 06/21/2017 EXAM: OVER-READ INTERPRETATION  CT CHEST The following report is an over-read performed by radiologist Dr. Rebekah [facility] on 06/21/2017. This over-read does not include interpretation of cardiac or coronary anatomy or pathology. The coronary calcium score interpretation by the cardiologist is attached. COMPARISON:  None. FINDINGS: Within the visualized portions of the thorax there are no suspicious appearing pulmonary nodules or masses, there is no acute consolidative airspace disease, no pleural effusions, no pneumothorax and no lymphadenopathy. Several densely calcified right hilar and mediastinal lymph nodes are incidentally noted. Visualized portions of the upper abdomen are unremarkable. There are no aggressive appearing lytic or blastic lesions noted in the visualized portions of the skeleton. IMPRESSION: 1. No significant incidental noncardiac findings are noted. Electronically Signed: By: Vinnie Langton M.D. On: 06/21/2017 09:09   Mm Diag Breast Tomo Bilateral  Result Date: 06/28/2017 CLINICAL  DATA:  Status post right lumpectomy with radiation in 2012. EXAM: 2D DIGITAL DIAGNOSTIC BILATERAL MAMMOGRAM WITH CAD AND ADJUNCT TOMO COMPARISON:  06/22/2016 and earlier ACR Breast Density Category c: The breast tissue is heterogeneously dense, which may obscure small masses. FINDINGS: Post operative changes are seen in the rightbreast. No suspicious mass, distortion, or microcalcifications are identified to suggest presence of malignancy. Mammographic images were  processed with CAD. IMPRESSION: No mammographic evidence for malignancy. RECOMMENDATION: Diagnostic mammogram is suggested in 1 year. (Code:[id number]-B-01Y) I have discussed the findings and recommendations with the patient. Results were also provided in writing at the conclusion of the visit. If applicable, a reminder letter will be sent to the patient regarding the next appointment. BI-RADS CATEGORY  2: Benign. Electronically Signed   By: Nolon Nations M.D.   On: 06/28/2017 09:24    ASSESSMENT: 76 y.o.  [place] woman status post right breast upper outer quadrant biopsy 05/02/2011 for a low-grade invasive ductal carcinoma which was estrogen receptor 100% positive, progesterone receptor 22% positive, with an MIB-1 of 17% and no HER-2 amplification, the signals ratio being 1.68  ([id number])  2. Status post right lumpectomy and sentinel lymph node sampling 05/16/2011 for a pT1c pN0, stage IA invasive ductal carcinoma, low-grade, with negative margins  3. Oncotype DX score of 13 predicted an 8% risk of recurrence outside the breast within 10 years if the patient's only systemic therapy is tamoxifen for 5 years. He also predicted no benefit from chemotherapy.  3. Adjuvant radiation completed 07/23/2011  4. Tamoxifen started August 2012, discontinued June 2015  5. Letrozole started August 2015, discontinued August 2017  (a) alendronate started August 2015  (b) bone [facility] at the [facility] 12/01/2015 shows osteoporosis with a T score of -2.5  PLAN: Melani is now 6 years out from definitive surgery for her breast cancer with no evidence of disease recurrence. He completed 5 years of aromatase inhibitors August 2017.  We reviewed her hysterectomy results which were benign and the results of her recent cardiac studies which also were favorable. She has had a slightly elevated LDL and is considering statins. I encouraged her to give that a try. Finally we reviewed her 06/28/2017 mammography  results, also very favorable  Her mood initially seemed to me somewhat guarded and I attributed that to stress regarding her mother's situation. However it turns out the room in which I saw her today is the room in which her husband was told by my former partner Dr. Caryl Comes that he had widely metastatic disease. He died a few weeks after that. We discussed her feelings once that became clear  She wishes to be followed here on a yearly basis so I am very glad to accommodate that. She will see me again in one year. She knows to call for any problems that may develop before that visit.   Chauncey Cruel, MD   07/10/2017 12:26 PM Medical Oncology and Hematology [facility] [address] [place],  [address] Tel. [phone]    Fax. [phone]

## 2017-07-10 NOTE — Telephone Encounter (Signed)
Gave patient avs and calendar for Aug. 2019.

## 2017-08-02 ENCOUNTER — Ambulatory Visit: Payer: PPO | Admitting: Cardiovascular Disease

## 2017-08-29 DIAGNOSIS — L814 Other melanin hyperpigmentation: Secondary | ICD-10-CM | POA: Diagnosis not present

## 2017-08-29 DIAGNOSIS — D485 Neoplasm of uncertain behavior of skin: Secondary | ICD-10-CM | POA: Diagnosis not present

## 2017-08-29 DIAGNOSIS — D045 Carcinoma in situ of skin of trunk: Secondary | ICD-10-CM | POA: Diagnosis not present

## 2017-08-29 DIAGNOSIS — D1801 Hemangioma of skin and subcutaneous tissue: Secondary | ICD-10-CM | POA: Diagnosis not present

## 2017-08-29 DIAGNOSIS — Z85828 Personal history of other malignant neoplasm of skin: Secondary | ICD-10-CM | POA: Diagnosis not present

## 2017-08-29 DIAGNOSIS — X32XXXA Exposure to sunlight, initial encounter: Secondary | ICD-10-CM | POA: Diagnosis not present

## 2017-09-09 DIAGNOSIS — H2513 Age-related nuclear cataract, bilateral: Secondary | ICD-10-CM | POA: Diagnosis not present

## 2017-10-16 ENCOUNTER — Telehealth: Payer: Self-pay

## 2017-10-16 NOTE — Telephone Encounter (Signed)
Copied from [place] [id number]. Topic: Inquiry >> Oct 16, 2017 10:09 AM Bea Graff, NT wrote: Reason for CRM: Patient calling to check and see if she needs to be scheduled for the new pneumonia vaccine (2 part series).   >> Oct 16, 2017 10:24 AM Helene Shoe, LPN wrote: Pt has had prevnar and pneumovax; do you want pt have the shingrix. [facility]. Pt request cb.

## 2017-10-16 NOTE — Telephone Encounter (Signed)
shingrix is a good idea but I do not think it is currently available   She can call her pharmacy to see when she is able to reserve a shingrix and the booster  Let me know when and I can send the px in at that time

## 2017-10-17 DIAGNOSIS — D045 Carcinoma in situ of skin of trunk: Secondary | ICD-10-CM | POA: Diagnosis not present

## 2017-10-17 NOTE — Telephone Encounter (Signed)
Pt notified of Dr. Tower's comments and verbalized understanding  

## 2017-11-13 DIAGNOSIS — H04123 Dry eye syndrome of bilateral lacrimal glands: Secondary | ICD-10-CM | POA: Diagnosis not present

## 2017-11-29 ENCOUNTER — Telehealth: Payer: Self-pay | Admitting: Family Medicine

## 2017-11-29 NOTE — Telephone Encounter (Signed)
Copied from [place]. Topic: Quick Communication - See Telephone Encounter >> Nov 29, 2017  1:59 PM Vernona Rieger wrote: CRM for notification. See Telephone encounter for:   11/29/17.   Pt is stating that her 47 year mom is now living with her & that her nerves are tore up & she wants to know what Dr. Glori Bickers could do for her? She said she already takes xanax every once in a while but not often as it is habit forming. Call back is [phone]

## 2017-11-29 NOTE — Telephone Encounter (Signed)
I will see her then  

## 2017-11-29 NOTE — Telephone Encounter (Signed)
Counseling would be my first choice but I can imagine getting out to do that would be impossible right now with care of her mother  We do not want to create dependence with xanax-not a good daily choice  I would like her to try buspar  (in the ssri family)  - 7.5 mg one pill twice daily (we can raise dose if needed)  If it causes any significant side effects please alert me - or if she feels worse instead of better (depressed or anxious) I also recommend picking up a book or recording or app for meditation (it helps also)   Please call in buspar 7.5 mg 1 po bid #[phone] ref

## 2017-11-29 NOTE — Telephone Encounter (Signed)
Pt wanted to come in for an OV to discuss issues and medication directly with Dr. Glori Bickers. appt scheduled on 12/06/17, pt doesn't want to start any med until she see's Dr. Glori Bickers so med wasn't sent into pharmacy   FYI to Dr. Glori Bickers so she is aware

## 2017-12-06 ENCOUNTER — Encounter: Payer: Self-pay | Admitting: Family Medicine

## 2017-12-06 ENCOUNTER — Ambulatory Visit (INDEPENDENT_AMBULATORY_CARE_PROVIDER_SITE_OTHER): Payer: PPO | Admitting: Family Medicine

## 2017-12-06 VITALS — BP 122/70 | HR 64 | Temp 97.6°F | Ht 63.0 in | Wt 138.5 lb

## 2017-12-06 DIAGNOSIS — Z636 Dependent relative needing care at home: Secondary | ICD-10-CM

## 2017-12-06 DIAGNOSIS — C50411 Malignant neoplasm of upper-outer quadrant of right female breast: Secondary | ICD-10-CM

## 2017-12-06 DIAGNOSIS — Z170 Estrogen receptor positive status [ER+]: Secondary | ICD-10-CM

## 2017-12-06 IMAGING — CT CT HEART SCORING
2 series · 16 of 20 positions shown, 18 images · non-contrast
Comparison: None.

CLINICAL DATA: Risk stratification

EXAM:
Coronary Calcium Score
TECHNIQUE: The patient was scanned on a Siemens Somatom 64 slice scanner. Axial
non-contrast 3 mm slices were carried out through the heart. The
data set was analyzed on a dedicated work station and scored using
the [place] method.

[Series 2: casc 3.0 i36f 2 bestdiast 71 % · axial · 0.32mm/px · z∈[+1165,+1270]mm · 8 of 45 slices shown, 10 images]
[im 5/45  vessel]
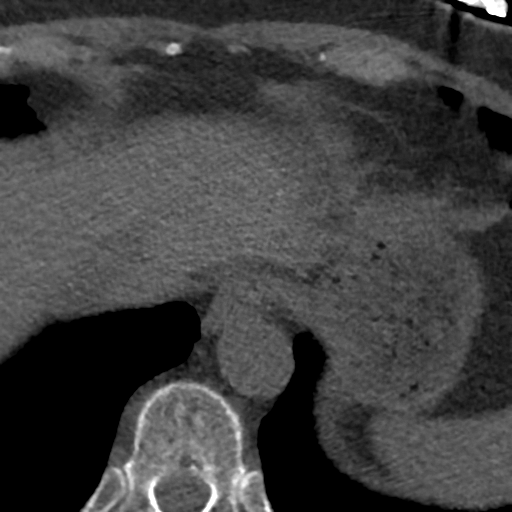
[im 5/45  lung]
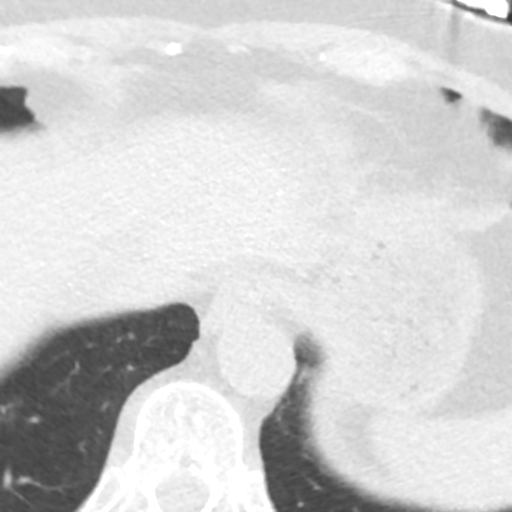
[im 10/45  vessel]
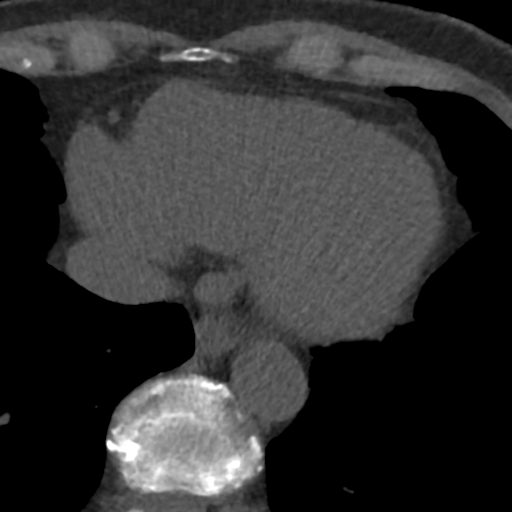
[im 15/45  vessel]
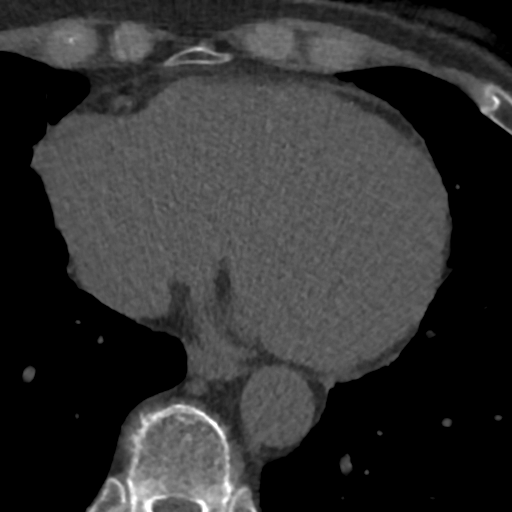
[im 20/45  vessel]
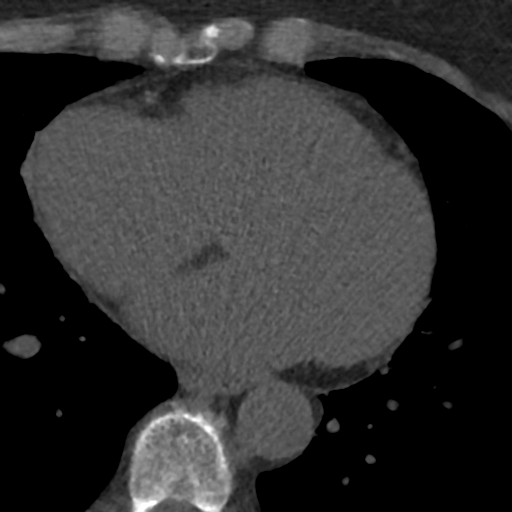
[im 25/45  vessel]
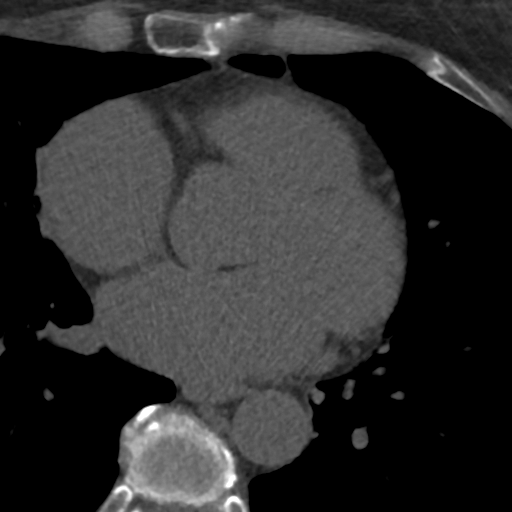
[im 25/45  lung]
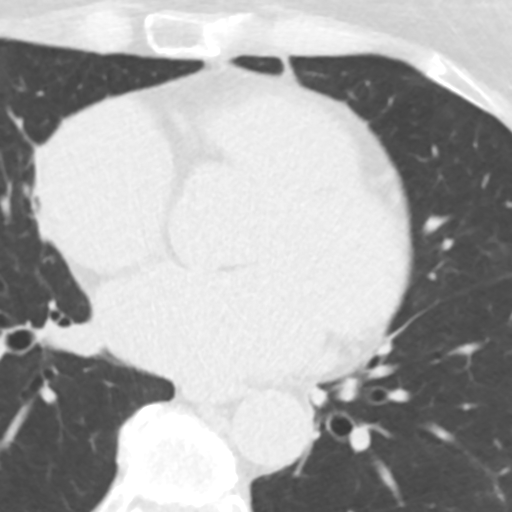
[im 30/45  vessel]
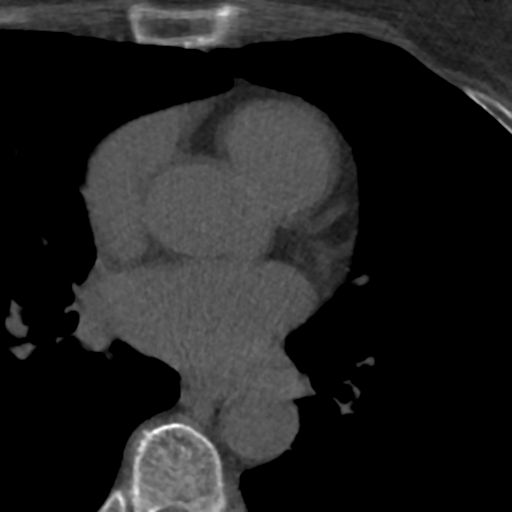
[im 35/45  vessel]
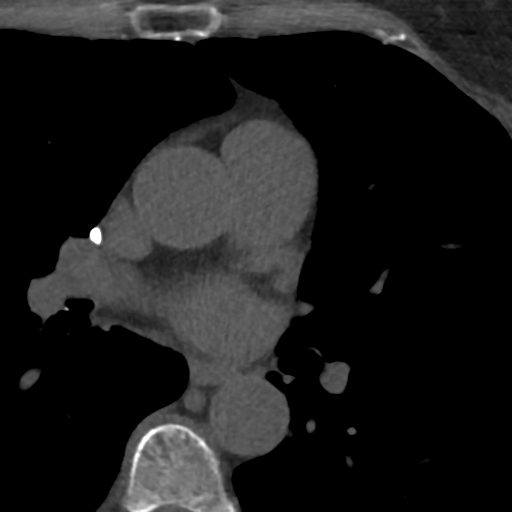
[im 40/45  vessel]
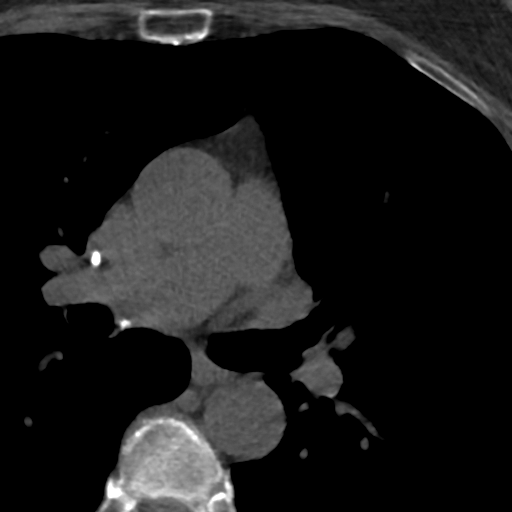

[Series 4: lung st 70 % · axial · 0.65mm/px · z∈[+1166,+1270]mm · 8 of 45 slices shown]
[im 5/45  lung]
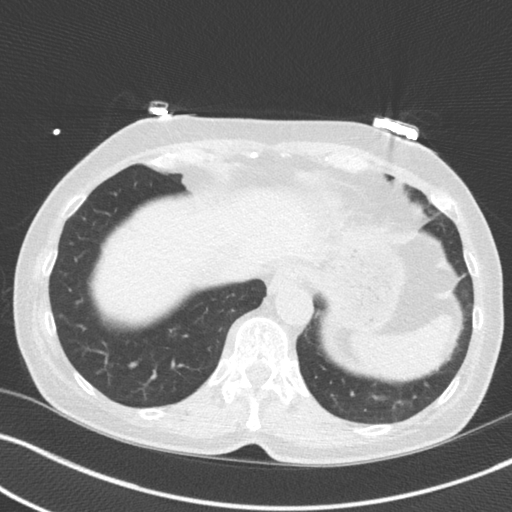
[im 10/45  lung]
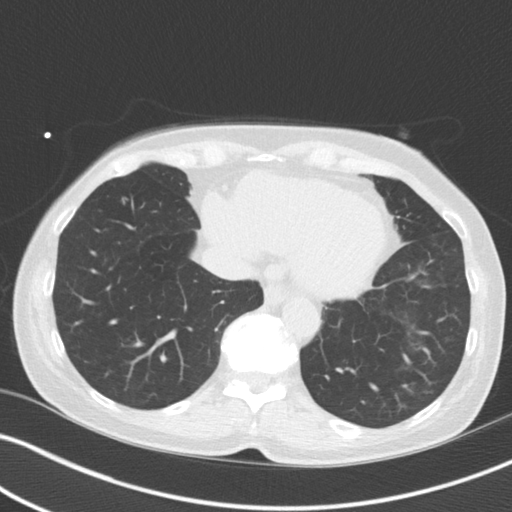
[im 15/45  lung]
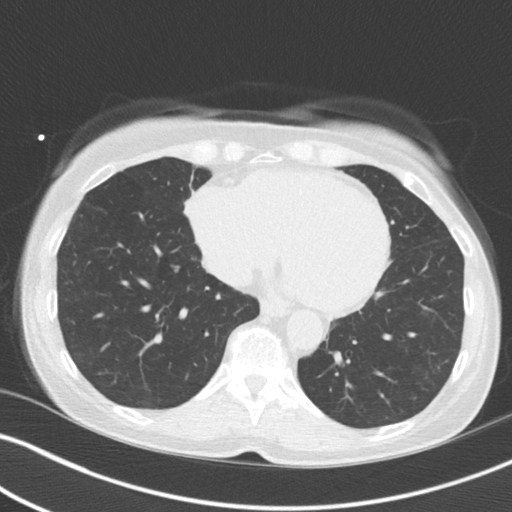
[im 20/45  lung]
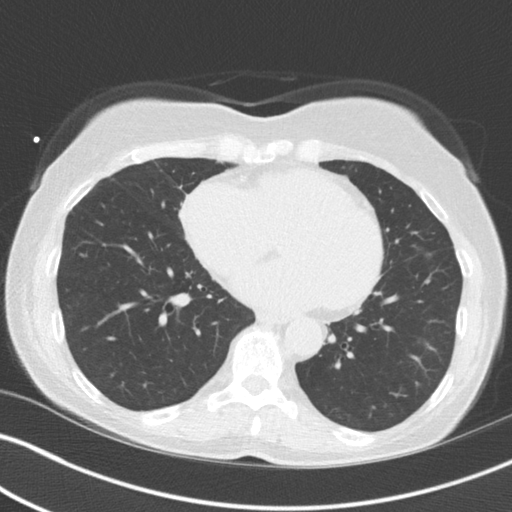
[im 25/45  lung]
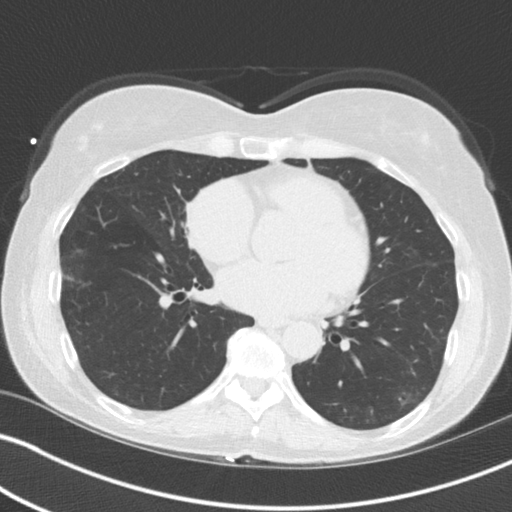
[im 30/45  lung]
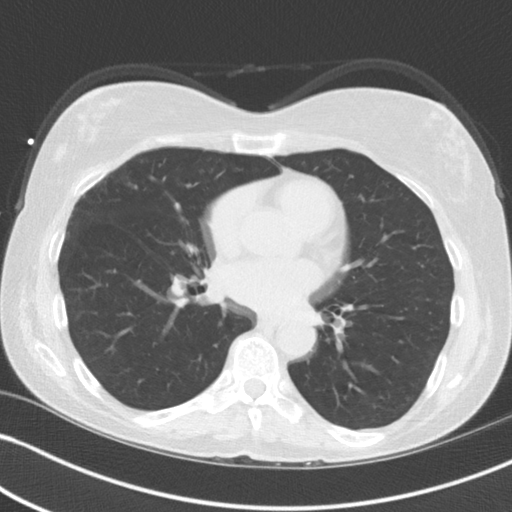
[im 35/45  lung]
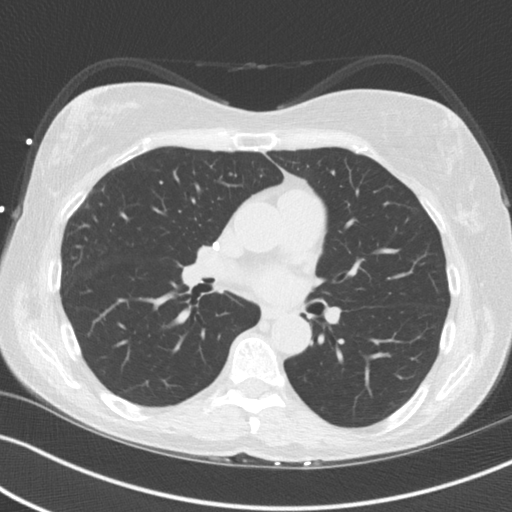
[im 40/45  lung]
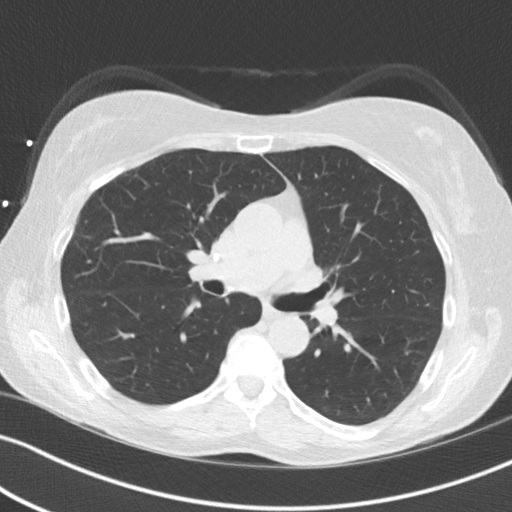

[16 of 20 positions shown; findings below may reference images not displayed]

FINDINGS: Non-cardiac: See separate report from [REDACTED].

Ascending Aorta:  Normal size, no calcifications.

Pericardium: Normal.

Coronary arteries:  Normal origin.
IMPRESSION: Coronary calcium score of 0. This was 0 percentile for age and sex
matched control.

Blain Jumper

EXAM:
OVER-READ INTERPRETATION  CT CHEST

The following report is an over-read performed by radiologist Dr.
over-read does not include interpretation of cardiac or coronary
anatomy or pathology. The coronary calcium score interpretation by
the cardiologist is attached.
FINDINGS: Within the visualized portions of the thorax there are no suspicious
appearing pulmonary nodules or masses, there is no acute
consolidative airspace disease, no pleural effusions, no
pneumothorax and no lymphadenopathy. Several densely calcified right
hilar and mediastinal lymph nodes are incidentally noted. Visualized
portions of the upper abdomen are unremarkable. There are no
aggressive appearing lytic or blastic lesions noted in the
visualized portions of the skeleton.
IMPRESSION: 1. No significant incidental noncardiac findings are noted.

## 2017-12-06 MED ORDER — SERTRALINE HCL 25 MG PO TABS: 25.0000 mg | ORAL_TABLET | Freq: Every day | ORAL | 11 refills | Status: DC

## 2017-12-06 NOTE — Progress Notes (Signed)
Subjective:    Patient ID: Gabrielle Webb, female    DOB: [birth year], 77 y.o.   MRN: [id number]  HPI  Here for stress reaction / caregiver stress  Absolutely exhausted    Wt Readings from Last 3 Encounters:  12/06/17 138 lb 8 oz (62.8 kg)  07/10/17 139 lb 4.8 oz (63.2 kg)  06/17/17 139 lb 8 oz (63.3 kg)   24.53 kg/m   Caring for mother in hospice (elderly)  Had atc sitters to begin with - due to $ had to go down to 4d per week Has not looked into applying for medicaid    She is hallucinating /dreaming/ talking in her sleep -this is frightening  She is ok overall (better doing the day) -and these do not worry her (just the caregiver)   She does have access to respite care when she needs it    She has not talked to a counselor yet  Very nervous to have her at night by herself    Has both depressive and anxious feelings/emotions  Gets both angry and sad (doubts herself quite a lot)  Cries a lot- which helps  Has good support  Has xanax- she does not take it often   Her main reservation regarding medication - is getting hooked on something   Has trouble sleeping  Appetite is up and down   Patient Active Problem List   Diagnosis Date Noted  . Caregiver stress 12/06/2017  . Fatigue 05/22/2017  . Chest discomfort 05/22/2017  . S/P TAH-BSO 11/08/2016  . Routine general medical examination at a health care facility 05/06/2016  . Malignant neoplasm of upper-outer quadrant of right breast in female, estrogen receptor positive ([place]) 03/21/2015  . History of tamoxifen therapy 05/18/2014  . Encounter for routine gynecological examination 05/06/2013  . Colon cancer screening 05/06/2013  . Encounter for Medicare annual wellness exam 04/28/2013  . Hx of radiation therapy   . Gynecological examination 12/25/2011  . Hx of Breast cancer, stage 1, Right, UOQ, Receptor+,Her2- 05/29/2011    Class: Stage 1  . Other screening mammogram 03/21/2011  . Post-menopausal 03/21/2011   . Hypothyroidism 01/23/2008  . Pure hypercholesterolemia 01/23/2008  . Anxious mood 01/23/2008  . ALLERGIC RHINITIS 01/23/2008  . GERD 01/23/2008  . DIVERTICULOSIS, COLON 01/23/2008  . IBS 01/23/2008  . FIBROCYSTIC BREAST DISEASE 01/23/2008  . Osteoporosis 10/17/2007   Past Medical History:  Diagnosis Date  . Allergy   . Anxiety   . Breast cancer ([place]) 7/12   Right  . Cellulitis   . Diverticulosis of colon   . GERD (gastroesophageal reflux disease)   . Headache    sinus  . History of hiatal hernia   . Hx of radiation therapy 06/20/11 - 07/11/11   right breast  . Hypothyroid   . Osteoporosis   . Personal history of radiation therapy 2012   Right Breast Cancer  . PONV (postoperative nausea and vomiting)   . Skin cancer    basal and squamous cell  . Vertigo    Past Surgical History:  Procedure Laterality Date  . ABDOMINAL HYSTERECTOMY N/A 11/08/2016   Procedure: HYSTERECTOMY TOTAL  ABDOMINAL;  Surgeon: Dian Queen, MD;  Location: [facility];  Service: Gynecology;  Laterality: N/A;  . APPENDECTOMY  07/2007  . back sugery  1970  . BREAST BIOPSY Left    Benign  . BREAST EXCISIONAL BIOPSY Left 1983  . BREAST EXCISIONAL BIOPSY Left 1983  . BREAST LUMPECTOMY Right 05/16/2011  . COLONOSCOPY    .  DILATION AND CURETTAGE OF UTERUS N/A 05/31/2014   Procedure: DILATATION AND CURETTAGE WITH ULTRASOUND GUIDANCE;  Surgeon: Cyril Mourning, MD;  Location: [facility];  Service: Gynecology;  Laterality: N/A;  . [place]  . right eye surgery    . SALPINGOOPHORECTOMY Bilateral 11/08/2016   Procedure: BILATERAL SALPINGO OOPHORECTOMY;  Surgeon: Dian Queen, MD;  Location: [facility];  Service: Gynecology;  Laterality: Bilateral;  . UPPER GI ENDOSCOPY     Social History   Tobacco Use  . Smoking status: Never Smoker  . Smokeless tobacco: Never Used  Substance Use Topics  . Alcohol use: No    Alcohol/week: 0.0 oz  . Drug use: No   Family History  Problem Relation Age of Onset    . Heart attack Father 42  . Hypertension Father   . Atrial fibrillation Mother   . Coronary artery disease Mother   . Diabetes Unknown        Grandmother  . Coronary artery disease Unknown        Grandmother  . Uterine cancer Unknown        Grandmother  . Leukemia Unknown        Aunt  . Cancer Maternal Aunt        leukemia  . Cancer Maternal Uncle        colon   Allergies  Allergen Reactions  . Codeine Nausea And Vomiting  . Fentanyl Nausea And Vomiting  . Hydromorphone Nausea And Vomiting       . Morphine Nausea And Vomiting  . Prednisone Palpitations    Rapid hear beat   Current Outpatient Medications on File Prior to Visit  Medication Sig Dispense Refill  . alendronate (FOSAMAX) 70 MG tablet ONE TABLET BY MOUTH EVERY 7 DAYS TAKE WITH FULL GLASS OF WATER DO NOTLIE DOWNFOR THE NEXT 30 MIN 12 tablet 3  . ALPRAZolam (XANAX) 0.25 MG tablet TAKE ONE TABLET BY MOUTH AS NEEDED FOR ANXIETY OR SLEEP 30 tablet 0  . aspirin 81 MG tablet Take 81 mg by mouth daily.     . chlorthalidone (HYGROTON) 50 MG tablet Take 1 tablet (50 mg total) by mouth daily as needed. For swelling. 90 tablet 1  . Cholecalciferol (VITAMIN D) 2000 UNITS CAPS Take 1 capsule by mouth daily.    Marland Kitchen dicyclomine (BENTYL) 10 MG capsule Take 1 capsule (10 mg total) by mouth 2 (two) times daily as needed for spasms. 30 capsule 2  . fluticasone (FLONASE) 50 MCG/ACT nasal spray 2 SPRAYS IN EACH NOSTRIL EVERY DAY 16 g 11  . levothyroxine (SYNTHROID, LEVOTHROID) 100 MCG tablet Take 1 tablet (100 mcg total) by mouth daily. 90 tablet 3  . Multiple Vitamin (MULTIVITAMIN) capsule Take 1 capsule by mouth daily.      . Naproxen Sodium (ALEVE PO) Take 1 tablet by mouth as needed.    . niacin 500 MG tablet Take 1,000 mg by mouth at bedtime.     Marland Kitchen OVER THE COUNTER MEDICATION Take 1 tablet by mouth daily as needed (allergies). Over the counter allergy medication    . ranitidine (ZANTAC) 150 MG tablet Take 150 mg by mouth as needed  for heartburn.     . diclofenac sodium (VOLTAREN) 1 % GEL Apply 2 g topically 4 (four) times daily. Rub into affected area of foot 2 to 4 times daily (Patient not taking: Reported on 12/06/2017) 100 g 2   No current facility-administered medications on file prior to visit.  Review of Systems  Constitutional: Positive for fatigue. Negative for activity change, appetite change, fever and unexpected weight change.  HENT: Negative for congestion, ear pain, rhinorrhea, sinus pressure and sore throat.   Eyes: Negative for pain, redness and visual disturbance.  Respiratory: Negative for cough, shortness of breath and wheezing.   Cardiovascular: Negative for chest pain and palpitations.  Gastrointestinal: Negative for abdominal pain, blood in stool, constipation and diarrhea.  Endocrine: Negative for polydipsia and polyuria.  Genitourinary: Negative for dysuria, frequency and urgency.  Musculoskeletal: Negative for arthralgias, back pain and myalgias.  Skin: Negative for pallor and rash.  Allergic/Immunologic: Negative for environmental allergies.  Neurological: Negative for dizziness, syncope and headaches.  Hematological: Negative for adenopathy. Does not bruise/bleed easily.  Psychiatric/Behavioral: Positive for decreased concentration, dysphoric mood and sleep disturbance. Negative for suicidal ideas. The patient is nervous/anxious.        Objective:   Physical Exam  Constitutional: She appears well-developed and well-nourished. No distress.  Well appearing but seems fatigued   HENT:  Head: Normocephalic and atraumatic.  Mouth/Throat: Oropharynx is clear and moist.  Eyes: Conjunctivae and EOM are normal. Pupils are equal, round, and reactive to light.  Neck: Normal range of motion. Neck supple. No JVD present. Carotid bruit is not present. No thyromegaly present.  Cardiovascular: Normal rate, regular rhythm, normal heart sounds and intact distal pulses. Exam reveals no gallop.    Pulmonary/Chest: Effort normal and breath sounds normal. No respiratory distress. She has no wheezes. She has no rales.  No crackles  Abdominal: She exhibits no abdominal bruit.  Musculoskeletal: She exhibits no edema.  Lymphadenopathy:    She has no cervical adenopathy.  Neurological: She is alert. She has normal reflexes. She displays no tremor. No cranial nerve deficit. She exhibits normal muscle tone. Coordination normal.  Skin: Skin is warm and dry. No rash noted.  Psychiatric: Her speech is normal and behavior is normal. Thought content normal. Her mood appears not anxious. Her affect is not blunt, not labile and not inappropriate. Cognition and memory are normal. She exhibits a depressed mood.  Seems fatigued and generally down Talks candidly about her stressors and care giving challenges  Mentally sharp Attentive           Assessment & Plan:  . Problem List Items Addressed This Visit      Other   Caregiver stress - Primary    With anx/dep symptoms Reviewed stressors/ coping techniques/symptoms/ support sources/ tx options and side effects in detail today  Recommended getting 7 night help with her mother so she can sleep  Also reaching out to social worker with hospice to disc process of medicaid for her mother/ poss of SNF if needed, inquire about frequency of respite allowed, and to get counseling for herself  Disc imp of self care / rest and nutrition Does not desire med at this time (seldom takes xanax) - but given px printed for sertraline if she changes her mind   (Discussed expectations of SSRI medication including time to effectiveness and mechanism of action, also poss of side effects (early and late)- including mental fuzziness, weight or appetite change, nausea and poss of worse dep or anxiety (even suicidal thoughts)  Pt voiced understanding and will stop med and update if this occurs ) She will update [name] if she fills it  >25 minutes spent in face to face time  with patient, >50% spent in counselling or coordination of care- incl need for counseling and antic guidance for  ongoing care for her mother and things she can do      Malignant neoplasm of upper-outer quadrant of right breast in female, estrogen receptor positive ([place])    No changes-doing well

## 2017-12-06 NOTE — Patient Instructions (Addendum)
Respite is helpful - find out how often you can do can take advantage to this   Also talk to your social worker about the process of applying for medicaid for your mom   I think you need to return to 7 night a week care as soon as possible   Look into counseling with hospice for yourself - please let me know if you need a referral or help with this   If you can line up some help/resolve some issues I think counseling alone would help Getting time for self care is paramount   At that point if you are still struggling at that point- the medication may be helpful -here is a px for zoloft to hold onto   If you start it and take it daily in the evening  Any intolerable side effects or if it makes you worse instead of better please let me know   Keep me posted

## 2017-12-08 NOTE — Assessment & Plan Note (Signed)
With anx/dep symptoms Reviewed stressors/ coping techniques/symptoms/ support sources/ tx options and side effects in detail today  Recommended getting 7 night help with her mother so she can sleep  Also reaching out to Education officer, museum with hospice to disc process of medicaid for her mother/ poss of SNF if needed, inquire about frequency of respite allowed, and to get counseling for herself  Disc imp of self care / rest and nutrition Does not desire med at this time (seldom takes xanax) - but given px printed for sertraline if she changes her mind   (Discussed expectations of SSRI medication including time to effectiveness and mechanism of action, also poss of side effects (early and late)- including mental fuzziness, weight or appetite change, nausea and poss of worse dep or anxiety (even suicidal thoughts)  Pt voiced understanding and will stop med and update if this occurs ) She will update [name] if she fills it  >25 minutes spent in face to face time with patient, >50% spent in counselling or coordination of care- incl need for counseling and antic guidance for ongoing care for her mother and things she can do

## 2017-12-08 NOTE — Assessment & Plan Note (Signed)
No changes - doing well.

## 2017-12-09 ENCOUNTER — Ambulatory Visit: Payer: PPO | Admitting: Family Medicine

## 2018-04-04 DIAGNOSIS — H00012 Hordeolum externum right lower eyelid: Secondary | ICD-10-CM | POA: Diagnosis not present

## 2018-04-24 DIAGNOSIS — Z08 Encounter for follow-up examination after completed treatment for malignant neoplasm: Secondary | ICD-10-CM | POA: Diagnosis not present

## 2018-04-24 DIAGNOSIS — C44712 Basal cell carcinoma of skin of right lower limb, including hip: Secondary | ICD-10-CM | POA: Diagnosis not present

## 2018-04-24 DIAGNOSIS — D485 Neoplasm of uncertain behavior of skin: Secondary | ICD-10-CM | POA: Diagnosis not present

## 2018-04-24 DIAGNOSIS — Z85828 Personal history of other malignant neoplasm of skin: Secondary | ICD-10-CM | POA: Diagnosis not present

## 2018-04-24 DIAGNOSIS — R202 Paresthesia of skin: Secondary | ICD-10-CM | POA: Diagnosis not present

## 2018-04-24 DIAGNOSIS — X32XXXA Exposure to sunlight, initial encounter: Secondary | ICD-10-CM | POA: Diagnosis not present

## 2018-04-24 DIAGNOSIS — L570 Actinic keratosis: Secondary | ICD-10-CM | POA: Diagnosis not present

## 2018-04-24 DIAGNOSIS — C44719 Basal cell carcinoma of skin of left lower limb, including hip: Secondary | ICD-10-CM | POA: Diagnosis not present

## 2018-04-30 ENCOUNTER — Other Ambulatory Visit: Payer: Self-pay | Admitting: Family Medicine

## 2018-05-26 ENCOUNTER — Ambulatory Visit (INDEPENDENT_AMBULATORY_CARE_PROVIDER_SITE_OTHER): Payer: PPO

## 2018-05-26 ENCOUNTER — Other Ambulatory Visit: Payer: Self-pay | Admitting: Oncology

## 2018-05-26 ENCOUNTER — Telehealth: Payer: Self-pay

## 2018-05-26 ENCOUNTER — Telehealth: Payer: Self-pay | Admitting: Family Medicine

## 2018-05-26 VITALS — BP 110/78 | HR 80 | Temp 98.0°F | Ht 63.0 in | Wt 138.5 lb

## 2018-05-26 DIAGNOSIS — E038 Other specified hypothyroidism: Secondary | ICD-10-CM

## 2018-05-26 DIAGNOSIS — Z0000 Encounter for general adult medical examination without abnormal findings: Secondary | ICD-10-CM

## 2018-05-26 DIAGNOSIS — E7800 Pure hypercholesterolemia, unspecified: Secondary | ICD-10-CM | POA: Diagnosis not present

## 2018-05-26 DIAGNOSIS — Z1231 Encounter for screening mammogram for malignant neoplasm of breast: Secondary | ICD-10-CM

## 2018-05-26 DIAGNOSIS — M810 Age-related osteoporosis without current pathological fracture: Secondary | ICD-10-CM

## 2018-05-26 DIAGNOSIS — E2839 Other primary ovarian failure: Secondary | ICD-10-CM | POA: Diagnosis not present

## 2018-05-26 LAB — CBC WITH DIFFERENTIAL/PLATELET
BASOS ABS: 0 10*3/uL (ref 0.0–0.1)
Basophils Relative: 0.2 % (ref 0.0–3.0)
EOS ABS: 0.1 10*3/uL (ref 0.0–0.7)
Eosinophils Relative: 1.8 % (ref 0.0–5.0)
HCT: 42.7 % (ref 36.0–46.0)
Hemoglobin: 14.5 g/dL (ref 12.0–15.0)
LYMPHS ABS: 2.5 10*3/uL (ref 0.7–4.0)
Lymphocytes Relative: 33.8 % (ref 12.0–46.0)
MCHC: 34 g/dL (ref 30.0–36.0)
MCV: 89.9 fl (ref 78.0–100.0)
MONO ABS: 0.8 10*3/uL (ref 0.1–1.0)
Monocytes Relative: 10.3 % (ref 3.0–12.0)
NEUTROS ABS: 4 10*3/uL (ref 1.4–7.7)
NEUTROS PCT: 53.9 % (ref 43.0–77.0)
PLATELETS: 205 10*3/uL (ref 150.0–400.0)
RBC: 4.75 Mil/uL (ref 3.87–5.11)
RDW: 14.1 % (ref 11.5–15.5)
WBC: 7.4 10*3/uL (ref 4.0–10.5)

## 2018-05-26 LAB — COMPREHENSIVE METABOLIC PANEL
ALT: 14 U/L (ref 0–35)
AST: 21 U/L (ref 0–37)
Albumin: 4.5 g/dL (ref 3.5–5.2)
Alkaline Phosphatase: 66 U/L (ref 39–117)
BILIRUBIN TOTAL: 0.6 mg/dL (ref 0.2–1.2)
BUN: 17 mg/dL (ref 6–23)
CALCIUM: 9.8 mg/dL (ref 8.4–10.5)
CO2: 31 meq/L (ref 19–32)
CREATININE: 0.85 mg/dL (ref 0.40–1.20)
Chloride: 101 mEq/L (ref 96–112)
GFR: 69.29 mL/min (ref >60.00)
GLUCOSE: 89 mg/dL (ref 70–99)
Potassium: 3.9 mEq/L (ref 3.5–5.1)
SODIUM: 139 meq/L (ref 135–145)
Total Protein: 7.4 g/dL (ref 6.0–8.3)

## 2018-05-26 LAB — LIPID PANEL
Cholesterol: 224 mg/dL — ABNORMAL HIGH (ref 0–200)
HDL: 62 mg/dL (ref >39.00)
LDL Cholesterol: 142 mg/dL — ABNORMAL HIGH (ref 0–99)
NONHDL: 161.57
TRIGLYCERIDES: 100 mg/dL (ref 0.0–149.0)
Total CHOL/HDL Ratio: 4
VLDL: 20 mg/dL (ref 0.0–40.0)

## 2018-05-26 LAB — TSH: TSH: 0.87 u[IU]/mL (ref 0.35–4.50)

## 2018-05-26 NOTE — Patient Instructions (Signed)
Gabrielle Webb , Thank you for taking time to come for your Medicare Wellness Visit. I appreciate your ongoing commitment to your health goals. Please review the following plan we discussed and let me know if I can assist you in the future.   These are the goals we discussed: Goals    . Patient Stated     Starting 05/26/2018, I will continue to take medications as prescribed.        This is a list of the screening recommended for you and due dates:  Health Maintenance  Topic Date Due  . Pap Smear  06/06/2018*  . Mammogram  05/27/2019*  . Tetanus Vaccine  05/27/2019*  . Flu Shot  07/03/2018  . Colon Cancer Screening  07/14/2023  . DEXA scan (bone density measurement)  Completed  . Pneumonia vaccines  Completed  *Topic was postponed. The date shown is not the original due date.   Preventive Care for Adults  A healthy lifestyle and preventive care can promote health and wellness. Preventive health guidelines for adults include the following key practices.  . A routine yearly physical is a good way to check with your health care provider about your health and preventive screening. It is a chance to share any concerns and updates on your health and to receive a thorough exam.  . Visit your dentist for a routine exam and preventive care every 6 months. Brush your teeth twice a day and floss once a day. Good oral hygiene prevents tooth decay and gum disease.  . The frequency of eye exams is based on your age, health, family medical history, use  of contact lenses, and other factors. Follow your health care provider's recommendations for frequency of eye exams.  . Eat a healthy diet. Foods like vegetables, fruits, whole grains, low-fat dairy products, and lean protein foods contain the nutrients you need without too many calories. Decrease your intake of foods high in solid fats, added sugars, and salt. Eat the right amount of calories for you. Get information about a proper diet from your  health care provider, if necessary.  . Regular physical exercise is one of the most important things you can do for your health. Most adults should get at least 150 minutes of moderate-intensity exercise (any activity that increases your heart rate and causes you to sweat) each week. In addition, most adults need muscle-strengthening exercises on 2 or more days a week.  Silver Sneakers may be a benefit available to you. To determine eligibility, you may visit the website: www.silversneakers.com or contact program at (406)729-5386 Mon-Fri between 8AM-8PM.   . Maintain a healthy weight. The body mass index (BMI) is a screening tool to identify possible weight problems. It provides an estimate of body fat based on height and weight. Your health care provider can find your BMI and can help you achieve or maintain a healthy weight.   For adults 20 years and older: ? A BMI below 18.5 is considered underweight. ? A BMI of 18.5 to 24.9 is normal. ? A BMI of 25 to 29.9 is considered overweight. ? A BMI of 30 and above is considered obese.   . Maintain normal blood lipids and cholesterol levels by exercising and minimizing your intake of saturated fat. Eat a balanced diet with plenty of fruit and vegetables. Blood tests for lipids and cholesterol should begin at age 13 and be repeated every 5 years. If your lipid or cholesterol levels are high, you are over 50, or you  are at high risk for heart disease, you may need your cholesterol levels checked more frequently. Ongoing high lipid and cholesterol levels should be treated with medicines if diet and exercise are not working.  . If you smoke, find out from your health care provider how to quit. If you do not use tobacco, please do not start.  . If you choose to drink alcohol, please do not consume more than 2 drinks per day. One drink is considered to be 12 ounces (355 mL) of beer, 5 ounces (148 mL) of wine, or 1.5 ounces (44 mL) of liquor.  . If you are  30-35 years old, ask your health care provider if you should take aspirin to prevent strokes.  . Use sunscreen. Apply sunscreen liberally and repeatedly throughout the day. You should seek shade when your shadow is shorter than you. Protect yourself by wearing long sleeves, pants, a wide-brimmed hat, and sunglasses year round, whenever you are outdoors.  . Once a month, do a whole body skin exam, using a mirror to look at the skin on your back. Tell your health care provider of new moles, moles that have irregular borders, moles that are larger than a pencil eraser, or moles that have changed in shape or color.

## 2018-05-26 NOTE — Telephone Encounter (Signed)
Copied from [place] [id number]. Topic: General - Other >> May 26, 2018 12:52 PM Lennox Solders wrote: Reason for CRM:pt is calling she had her tdap on 12-10-2017 at [facility] in [place]. Pt had her wellness visit today

## 2018-05-26 NOTE — Telephone Encounter (Signed)
Copied from [place] [id number]. Topic: Referral - Request >> May 26, 2018  1:52 PM Synthia Innocent wrote: Reason for CRM: Requesting order for Bone density for [place]

## 2018-05-26 NOTE — Telephone Encounter (Signed)
Gabrielle Webb  Can you put orders in for bone density?  Thanks  [name]

## 2018-05-26 NOTE — Telephone Encounter (Signed)
Order placed for bone density  

## 2018-05-26 NOTE — Telephone Encounter (Signed)
-----   Message from Eustace Pen, LPN sent at 08/06/91  1:24 PM EDT ----- Regarding: Labs 6/24 Lab orders needed. Thank you.  Insurance:  [employer]

## 2018-05-27 NOTE — Telephone Encounter (Signed)
Appointment 8/7 pt aware

## 2018-05-28 ENCOUNTER — Ambulatory Visit: Payer: PPO | Admitting: Family Medicine

## 2018-05-28 DIAGNOSIS — E2839 Other primary ovarian failure: Secondary | ICD-10-CM | POA: Insufficient documentation

## 2018-05-28 DIAGNOSIS — Z0000 Encounter for general adult medical examination without abnormal findings: Secondary | ICD-10-CM | POA: Insufficient documentation

## 2018-05-28 NOTE — Progress Notes (Signed)
PCP notes:   Health maintenance:  PAP smear - pt will discuss with PCP  Mammogram - scheduled 07/09/18 Bone density - scheduled 07/09/18  Abnormal screenings:   Hearing - failed (right ear)  Hearing Screening   125Hz  250Hz  500Hz  1000Hz  2000Hz  3000Hz  4000Hz  6000Hz  8000Hz   Right ear:   40 40 40  0    Left ear:           Comments: Deaf in left ear  Patient concerns:   None  Nurse concerns:  None  Next PCP appt:   06/03/18 @ 1430

## 2018-05-28 NOTE — Progress Notes (Signed)
Subjective:   Gabrielle Webb is a 77 y.o. female who presents for Medicare Annual (Subsequent) preventive examination.  Review of Systems:  N/A Cardiac Risk Factors include: advanced age (>15men, >1 women);dyslipidemia     Objective:     Vitals: BP 110/78 (BP Location: Right Arm, Patient Position: Sitting, Cuff Size: Normal)   Pulse 80   Temp 98 F (36.7 C) (Oral)   Ht 5\' 3"  (1.6 m) Comment: no shoes  Wt 138 lb 8 oz (62.8 kg)   LMP 12/03/1988   SpO2 97%   BMI 24.53 kg/m   Body mass index is 24.53 kg/m.  Advanced Directives 05/26/2018 05/20/2017 11/08/2016 11/08/2016 10/29/2016 07/10/2016 05/11/2016  Does Patient Have a Medical Advance Directive? Yes Yes Yes Yes Yes Yes Yes  Type of Paramedic of [place];Living will [place];Living will - Healthcare Power of [place];Living will Healthcare Power of [place];Living will  Does patient want to make changes to medical advance directive? - - No - Patient declined - - - No - Patient declined  Copy of [place] in Chart? Yes No - copy requested - Yes Yes - No - copy requested  Would patient like information on creating a medical advance directive? - - No - Patient declined - - - -    Tobacco Social History   Tobacco Use  Smoking Status Never Smoker  Smokeless Tobacco Never Used     Counseling given: No   Clinical Intake:  Pre-visit preparation completed: Yes  Pain : No/denies pain Pain Score: 0-No pain     Nutritional Status: BMI 25 -29 Overweight Nutritional Risks: None Diabetes: No  How often do you need to have someone help you when you read instructions, pamphlets, or other written materials from your doctor or pharmacy?: 1 - Never What is the last grade level you completed in school?: 12th grade  Interpreter Needed?: No  Information entered by :: LPinson, LPN  Past Medical History:    Diagnosis Date  . Allergy   . Anxiety   . Breast cancer ([place]) 7/12   Right  . Cellulitis   . Diverticulosis of colon   . GERD (gastroesophageal reflux disease)   . Headache    sinus  . History of hiatal hernia   . Hx of radiation therapy 06/20/11 - 07/11/11   right breast  . Hypothyroid   . Osteoporosis   . Personal history of radiation therapy 2012   Right Breast Cancer  . PONV (postoperative nausea and vomiting)   . Skin cancer    basal and squamous cell  . Vertigo    Past Surgical History:  Procedure Laterality Date  . ABDOMINAL HYSTERECTOMY N/A 11/08/2016   Procedure: HYSTERECTOMY TOTAL  ABDOMINAL;  Surgeon: Dian Queen, MD;  Location: [facility];  Service: Gynecology;  Laterality: N/A;  . APPENDECTOMY  07/2007  . back sugery  1970  . BREAST BIOPSY Left    Benign  . BREAST EXCISIONAL BIOPSY Left 1983  . BREAST EXCISIONAL BIOPSY Left 1983  . BREAST LUMPECTOMY Right 05/16/2011  . COLONOSCOPY    . DILATION AND CURETTAGE OF UTERUS N/A 05/31/2014   Procedure: DILATATION AND CURETTAGE WITH ULTRASOUND GUIDANCE;  Surgeon: Cyril Mourning, MD;  Location: [facility];  Service: Gynecology;  Laterality: N/A;  . [place]  . right eye surgery    . SALPINGOOPHORECTOMY Bilateral 11/08/2016   Procedure: BILATERAL SALPINGO OOPHORECTOMY;  Surgeon: Dian Queen, MD;  Location: [facility];  Service: Gynecology;  Laterality: Bilateral;  . UPPER GI ENDOSCOPY     Family History  Problem Relation Age of Onset  . Heart attack Father 23  . Hypertension Father   . Atrial fibrillation Mother   . Coronary artery disease Mother   . Diabetes Unknown        Grandmother  . Coronary artery disease Unknown        Grandmother  . Uterine cancer Unknown        Grandmother  . Leukemia Unknown        Aunt  . Cancer Maternal Aunt        leukemia  . Cancer Maternal Uncle        colon   Social History   Socioeconomic History  . Marital status: Widowed    Spouse name: Not on file  . Number  of children: 1  . Years of education: Not on file  . Highest education level: Not on file  Occupational History  . Occupation: Human resources officer  Social Needs  . Financial resource strain: Not on file  . Food insecurity:    Worry: Not on file    Inability: Not on file  . Transportation needs:    Medical: Not on file    Non-medical: Not on file  Tobacco Use  . Smoking status: Never Smoker  . Smokeless tobacco: Never Used  Substance and Sexual Activity  . Alcohol use: No    Alcohol/week: 0.0 oz  . Drug use: No  . Sexual activity: Not Currently    Birth control/protection: Post-menopausal  Lifestyle  . Physical activity:    Days per week: Not on file    Minutes per session: Not on file  . Stress: Not on file  Relationships  . Social connections:    Talks on phone: Not on file    Gets together: Not on file    Attends religious service: Not on file    Active member of club or organization: Not on file    Attends meetings of clubs or organizations: Not on file    Relationship status: Not on file  Other Topics Concern  . Not on file  Social History Narrative   Exercises on [employer] elliptical    Outpatient Encounter Medications as of 05/26/2018  Medication Sig  . alendronate (FOSAMAX) 70 MG tablet ONE TABLET BY MOUTH EVERY 7 DAYS TAKE WITH FULL GLASS OF WATER DO NOTLIE DOWNFOR THE NEXT 30 MIN  . aspirin 81 MG tablet Take 81 mg by mouth daily.   Marland Kitchen BIOTIN PO Take by mouth daily.  Marland Kitchen CALCIUM PO Take 1,500 mg by mouth daily.  . chlorthalidone (HYGROTON) 50 MG tablet Take 1 tablet (50 mg total) by mouth daily as needed. For swelling.  . Cholecalciferol (VITAMIN D) 2000 UNITS CAPS Take 1 capsule by mouth daily.  . Cyanocobalamin (B-12 PO) Take 2,500 mcg by mouth daily.  . diclofenac sodium (VOLTAREN) 1 % GEL Apply 2 g topically 4 (four) times daily. Rub into affected area of foot 2 to 4 times daily (Patient taking differently: Apply 2 g topically as needed. Rub into affected area  of foot 2 to 4 times daily)  . dicyclomine (BENTYL) 10 MG capsule Take 1 capsule (10 mg total) by mouth 2 (two) times daily as needed for spasms.  . fluticasone (FLONASE) 50 MCG/ACT nasal spray TWO SPRAYS IN EACH NOSTRIL EVERY DAY  . levothyroxine (SYNTHROID, LEVOTHROID) 100  MCG tablet Take 1 tablet (100 mcg total) by mouth daily.  . LUTEIN PO Take 6 mg by mouth daily.  . Multiple Vitamin (MULTIVITAMIN) capsule Take 1 capsule by mouth daily.    . Naproxen Sodium (ALEVE PO) Take 1 tablet by mouth as needed.  . niacin 500 MG tablet Take 1,000 mg by mouth at bedtime.   Marland Kitchen OVER THE COUNTER MEDICATION Take 1 tablet by mouth daily as needed (allergies). Over the counter allergy medication  . ranitidine (ZANTAC) 150 MG tablet Take 150 mg by mouth as needed for heartburn.   . sertraline (ZOLOFT) 25 MG tablet Take 1 tablet (25 mg total) by mouth daily.  . [DISCONTINUED] ALPRAZolam (XANAX) 0.25 MG tablet TAKE ONE TABLET BY MOUTH AS NEEDED FOR ANXIETY OR SLEEP   No facility-administered encounter medications on file as of 05/26/2018.     Activities of Daily Living In your present state of health, do you have any difficulty performing the following activities: 05/26/2018  Hearing? N  Vision? N  Difficulty concentrating or making decisions? N  Walking or climbing stairs? N  Dressing or bathing? N  Doing errands, shopping? N  Preparing Food and eating ? N  Using the Toilet? N  In the past six months, have you accidently leaked urine? N  Do you have problems with loss of bowel control? N  Managing your Medications? N  Managing your Finances? N  Housekeeping or managing your Housekeeping? N  Some recent data might be hidden    Patient Care Team: Tower, Wynelle Fanny, MD as PCP - General Magrinat, Virgie Dad, MD as Consulting Physician (Oncology) Dian Queen, MD as Consulting Physician (Obstetrics and Gynecology) Eula Flax, OD as Referring Physician (Ophthalmology) Clyde Canterbury, MD as Referring  Physician (Otolaryngology) Oneta Rack, MD as Consulting Physician (Dermatology) Ocie Cornfield, DDS as Consulting Physician (Dentistry) Rockey Situ Kathlene November, MD as Consulting Physician (Cardiology)    Assessment:   This is a routine wellness examination for Talent.   Hearing Screening   125Hz  250Hz  500Hz  1000Hz  2000Hz  3000Hz  4000Hz  6000Hz  8000Hz   Right ear:   40 40 40  0    Left ear:           Comments: Deaf in left ear  Vision Screening Comments: Vision exam in May 2019 @ [facility]    Exercise Activities and Dietary recommendations Current Exercise Habits: The patient does not participate in regular exercise at present, Exercise limited by: None identified  Goals    . Patient Stated     Starting 05/26/2018, I will continue to take medications as prescribed.        Fall Risk Fall Risk  05/26/2018 05/20/2017 05/11/2016 05/13/2015 05/11/2014  Falls in the past year? No No No No No   Depression Screen PHQ 2/9 Scores 05/26/2018 05/20/2017 05/11/2016 05/13/2015  PHQ - 2 Score 0 0 0 0  PHQ- 9 Score 0 - - -     Cognitive Function MMSE - Mini Mental State Exam 05/26/2018 05/20/2017 05/11/2016  Orientation to time 5 5 5   Orientation to Place 5 5 5   Registration 3 3 3   Attention/ Calculation 0 0 0  Recall 3 3 3   Language- name 2 objects 0 0 0  Language- repeat 1 1 1   Language- follow 3 step command 3 3 3   Language- read & follow direction 0 0 0  Write a sentence 0 0 0  Copy design 0 0 0  Total score 20 20 20  PLEASE NOTE: A Mini-Cog screen was completed. Maximum score is 20. A value of 0 denotes this part of [place] MMSE was not completed or the patient failed this part of the Mini-Cog screening.   Mini-Cog Screening Orientation to Time - Max 5 pts Orientation to Place - Max 5 pts Registration - Max 3 pts Recall - Max 3 pts Language Repeat - Max 1 pts Language Follow 3 Step Command - Max 3 pts     Immunization History  Administered Date(s) Administered  .  Influenza Split 09/17/2011  . Influenza Whole 10/17/2007, 09/27/2008, 09/23/2009  . Influenza, High Dose Seasonal PF 09/19/2015, 08/26/2017  . Influenza-Unspecified 09/01/2013, 10/19/2014, 08/24/2016  . Pneumococcal Conjugate-13 05/13/2015  . Pneumococcal Polysaccharide-23 12/16/2006, 05/11/2014  . Td 12/03/2005  . Tdap 12/10/2017  . Zoster 12/03/2007  . Zoster Recombinat (Shingrix) 05/21/2018   Screening Tests Health Maintenance  Topic Date Due  . PAP SMEAR  06/06/2018 (Originally 05/12/2017)  . MAMMOGRAM  05/27/2019 (Originally 12/29/2017)  . INFLUENZA VACCINE  07/03/2018  . COLONOSCOPY  07/14/2023  . TETANUS/TDAP  12/11/2027  . DEXA SCAN  Completed  . PNA vac Low Risk Adult  Completed      Plan:     I have personally reviewed, addressed, and noted the following in the patient's chart:  A. Medical and social history B. Use of alcohol, tobacco or illicit drugs  C. Current medications and supplements D. Functional ability and status E.  Nutritional status F.  Physical activity G. Advance directives H. List of other physicians I.  Hospitalizations, surgeries, and ER visits in previous 12 months J.  [place] to include hearing, vision, cognitive, depression L. Referrals and appointments - none  In addition, I have reviewed and discussed with patient certain preventive protocols, quality metrics, and best practice recommendations. A written personalized care plan for preventive services as well as general preventive health recommendations were provided to patient.  See attached scanned questionnaire for additional information.   Signed,   Lindell Noe, MHA, BS, LPN Health Coach

## 2018-06-03 ENCOUNTER — Encounter: Payer: Self-pay | Admitting: Family Medicine

## 2018-06-03 ENCOUNTER — Ambulatory Visit (INDEPENDENT_AMBULATORY_CARE_PROVIDER_SITE_OTHER): Payer: PPO | Admitting: Family Medicine

## 2018-06-03 VITALS — BP 126/68 | HR 73 | Temp 97.8°F | Ht 63.0 in | Wt 140.0 lb

## 2018-06-03 DIAGNOSIS — F419 Anxiety disorder, unspecified: Secondary | ICD-10-CM | POA: Diagnosis not present

## 2018-06-03 DIAGNOSIS — E038 Other specified hypothyroidism: Secondary | ICD-10-CM | POA: Diagnosis not present

## 2018-06-03 DIAGNOSIS — Z636 Dependent relative needing care at home: Secondary | ICD-10-CM

## 2018-06-03 DIAGNOSIS — M810 Age-related osteoporosis without current pathological fracture: Secondary | ICD-10-CM | POA: Diagnosis not present

## 2018-06-03 DIAGNOSIS — Z0000 Encounter for general adult medical examination without abnormal findings: Secondary | ICD-10-CM

## 2018-06-03 DIAGNOSIS — E7800 Pure hypercholesterolemia, unspecified: Secondary | ICD-10-CM | POA: Diagnosis not present

## 2018-06-03 MED ORDER — CHLORTHALIDONE 50 MG PO TABS: 50.0000 mg | ORAL_TABLET | Freq: Every day | ORAL | 3 refills | Status: DC | PRN

## 2018-06-03 MED ORDER — ALENDRONATE SODIUM 70 MG PO TABS: ORAL_TABLET | ORAL | 3 refills | Status: DC

## 2018-06-03 MED ORDER — LEVOTHYROXINE SODIUM 100 MCG PO TABS: 100.0000 ug | ORAL_TABLET | Freq: Every day | ORAL | 3 refills | Status: DC

## 2018-06-03 NOTE — Assessment & Plan Note (Signed)
Hypothyroidism  Pt has no clinical changes No change in energy level/ hair or skin/ edema and no tremor Lab Results  Component Value Date   TSH 0.87 05/26/2018

## 2018-06-03 NOTE — Patient Instructions (Addendum)
To stop zoloft you can go ahead and stop it   For cholesterol Avoid red meat/ fried foods/ egg yolks/ fatty breakfast meats/ butter, cheese and high fat dairy/ and shellfish    Continue taking good care of yourself   Follow up for your mammogram and gyn follow up and colonoscopy   Keep me posted

## 2018-06-03 NOTE — Assessment & Plan Note (Signed)
Reviewed health habits including diet and exercise and skin cancer prevention Reviewed appropriate screening tests for age  Also reviewed health mt list, fam hx and immunization status , as well as social and family history   See HPI Labs rev  amw rev Overall doing well  Mammogram and dexa planned Colonoscopy planned Gyn f/u and oncology f/u planned  Will continue to work on diet for cholesterol

## 2018-06-03 NOTE — Assessment & Plan Note (Signed)
Much improved now that her mother is in SNF Plans to come off zoloft

## 2018-06-03 NOTE — Assessment & Plan Note (Signed)
Much improved now that mother is in SNF

## 2018-06-03 NOTE — Assessment & Plan Note (Signed)
Has dexa scheduled for aug On fosamax since 2016 No falls or fx On vit d and exercising

## 2018-06-03 NOTE — Progress Notes (Signed)
Subjective:    Patient ID: Gabrielle Webb, female    DOB: 10-06-1941, 77 y.o.   MRN: [id number]  HPI Here for health maintenance exam and to review chronic medical problems    She is taking care of herself  Feels ready to stop zoloft- takes it every other night  She is ready to come off of it   Her mother is in nursing home- that is going very well   Going to see Dr Helane Rima this month -gyn  Wt Readings from Last 3 Encounters:  06/03/18 140 lb (63.5 kg)  05/26/18 138 lb 8 oz (62.8 kg)  12/06/17 138 lb 8 oz (62.8 kg)  stable  [facility] [employer] [facility]/ just started walking every other day also  Also makes effort to eat healthy  24.80 kg/m   amw on 6/24 Missed high Hz in R ear-- she was exposed to loud noise recently  Is deaf in L ear baseline   Mammogram 7/18-has next one planned  Self breast exam - no lumps  Hx of breast cancer with tamoxifen in the past ([place])  Dr Jana Hakim f/u is later this month   Had hysterectomy in 2017    Colonoscopy 8/14 with 5 y recall for small polyp- consult for this upcoming with Dr Tiffany Kocher   dexa 12/16 -is scheduled for re check in aug On fosamax -no problems  Gets enough vit D Started exercising    Had shingrix 6/19- has repeat in 5 months   Hypothyroidism  Pt has no clinical changes No change in energy level/ hair or skin/ edema and no tremor Lab Results  Component Value Date   TSH 0.87 05/26/2018     Hyperlipidemia Lab Results  Component Value Date   CHOL 224 (H) 05/26/2018   CHOL 210 (H) 05/20/2017   CHOL 221 (H) 05/11/2016   Lab Results  Component Value Date   HDL 62.00 05/26/2018   HDL 60.40 05/20/2017   HDL 64.30 05/11/2016   Lab Results  Component Value Date   LDLCALC 142 (H) 05/26/2018   LDLCALC 128 (H) 05/20/2017   LDLCALC 137 (H) 05/11/2016   Lab Results  Component Value Date   TRIG 100.0 05/26/2018   TRIG 105.0 05/20/2017   TRIG 102.0 05/11/2016   Lab Results  Component Value Date   CHOLHDL 4  05/26/2018   CHOLHDL 3 05/20/2017   CHOLHDL 3 05/11/2016   Lab Results  Component Value Date   LDLDIRECT 142.0 03/19/2011   LDLDIRECT 125.4 12/08/2009   LDLDIRECT 172.0 09/19/2009   ? What brought up her LDL  occ eggs  Uses non fat products  No fat yogurt  ? Genetic high cholesterol    Other labs Results for orders placed or performed in visit on 05/26/18  TSH  Result Value Ref Range   TSH 0.87 0.35 - 4.50 uIU/mL  Lipid Panel  Result Value Ref Range   Cholesterol 224 (H) 0 - 200 mg/dL   Triglycerides 100.0 0.0 - 149.0 mg/dL   HDL 62.00 >39.00 mg/dL   VLDL 20.0 0.0 - 40.0 mg/dL   LDL Cholesterol 142 (H) 0 - 99 mg/dL   Total CHOL/HDL Ratio 4    NonHDL 161.57   Comprehensive metabolic panel  Result Value Ref Range   Sodium 139 135 - 145 mEq/L   Potassium 3.9 3.5 - 5.1 mEq/L   Chloride 101 96 - 112 mEq/L   CO2 31 19 - 32 mEq/L   Glucose, Bld 89 70 -  99 mg/dL   BUN 17 6 - 23 mg/dL   Creatinine, Ser 0.85 0.40 - 1.20 mg/dL   Total Bilirubin 0.6 0.2 - 1.2 mg/dL   Alkaline Phosphatase 66 39 - 117 U/L   AST 21 0 - 37 U/L   ALT 14 0 - 35 U/L   Total Protein 7.4 6.0 - 8.3 g/dL   Albumin 4.5 3.5 - 5.2 g/dL   Calcium 9.8 8.4 - 10.5 mg/dL   GFR 69.29 >60.00 mL/min  CBC with Differential/Platelet  Result Value Ref Range   WBC 7.4 4.0 - 10.5 K/uL   RBC 4.75 3.87 - 5.11 Mil/uL   Hemoglobin 14.5 12.0 - 15.0 g/dL   HCT 42.7 36.0 - 46.0 %   MCV 89.9 78.0 - 100.0 fl   MCHC 34.0 30.0 - 36.0 g/dL   RDW 14.1 11.5 - 15.5 %   Platelets 205.0 150.0 - 400.0 K/uL   Neutrophils Relative % 53.9 43.0 - 77.0 %   Lymphocytes Relative 33.8 12.0 - 46.0 %   Monocytes Relative 10.3 3.0 - 12.0 %   Eosinophils Relative 1.8 0.0 - 5.0 %   Basophils Relative 0.2 0.0 - 3.0 %   Neutro Abs 4.0 1.4 - 7.7 K/uL   Lymphs Abs 2.5 0.7 - 4.0 K/uL   Monocytes Absolute 0.8 0.1 - 1.0 K/uL   Eosinophils Absolute 0.1 0.0 - 0.7 K/uL   Basophils Absolute 0.0 0.0 - 0.1 K/uL    Patient Active Problem List    Diagnosis Date Noted  . Medicare annual wellness visit, subsequent 05/28/2018  . Estrogen deficiency 05/28/2018  . Caregiver stress 12/06/2017  . Fatigue 05/22/2017  . Chest discomfort 05/22/2017  . S/P TAH-BSO 11/08/2016  . Routine general medical examination at a health care facility 05/06/2016  . Malignant neoplasm of upper-outer quadrant of right breast in female, estrogen receptor positive ([place]) 03/21/2015  . History of tamoxifen therapy 05/18/2014  . Encounter for routine gynecological examination 05/06/2013  . Colon cancer screening 05/06/2013  . Encounter for Medicare annual wellness exam 04/28/2013  . Hx of radiation therapy   . Gynecological examination 12/25/2011  . Hx of Breast cancer, stage 1, Right, UOQ, Receptor+,Her2- 05/29/2011    Class: Stage 1  . Other screening mammogram 03/21/2011  . Post-menopausal 03/21/2011  . Hypothyroidism 01/23/2008  . Pure hypercholesterolemia 01/23/2008  . ALLERGIC RHINITIS 01/23/2008  . GERD 01/23/2008  . DIVERTICULOSIS, COLON 01/23/2008  . IBS 01/23/2008  . FIBROCYSTIC BREAST DISEASE 01/23/2008  . Osteoporosis 10/17/2007   Past Medical History:  Diagnosis Date  . Allergy   . Anxiety   . Breast cancer ([place]) 7/12   Right  . Cellulitis   . Diverticulosis of colon   . GERD (gastroesophageal reflux disease)   . Headache    sinus  . History of hiatal hernia   . Hx of radiation therapy 06/20/11 - 07/11/11   right breast  . Hypothyroid   . Osteoporosis   . Personal history of radiation therapy 2012   Right Breast Cancer  . PONV (postoperative nausea and vomiting)   . Skin cancer    basal and squamous cell  . Vertigo    Past Surgical History:  Procedure Laterality Date  . ABDOMINAL HYSTERECTOMY N/A 11/08/2016   Procedure: HYSTERECTOMY TOTAL  ABDOMINAL;  Surgeon: Dian Queen, MD;  Location: [facility];  Service: Gynecology;  Laterality: N/A;  . APPENDECTOMY  07/2007  . back sugery  1970  . BREAST BIOPSY Left    Benign  .  BREAST EXCISIONAL BIOPSY Left 1983  . BREAST EXCISIONAL BIOPSY Left 1983  . BREAST LUMPECTOMY Right 05/16/2011  . COLONOSCOPY    . DILATION AND CURETTAGE OF UTERUS N/A 05/31/2014   Procedure: DILATATION AND CURETTAGE WITH ULTRASOUND GUIDANCE;  Surgeon: Cyril Mourning, MD;  Location: [facility];  Service: Gynecology;  Laterality: N/A;  . [place]  . right eye surgery    . SALPINGOOPHORECTOMY Bilateral 11/08/2016   Procedure: BILATERAL SALPINGO OOPHORECTOMY;  Surgeon: Dian Queen, MD;  Location: [facility];  Service: Gynecology;  Laterality: Bilateral;  . UPPER GI ENDOSCOPY     Social History   Tobacco Use  . Smoking status: Never Smoker  . Smokeless tobacco: Never Used  Substance Use Topics  . Alcohol use: No    Alcohol/week: 0.0 oz  . Drug use: No   Family History  Problem Relation Age of Onset  . Heart attack Father 6  . Hypertension Father   . Atrial fibrillation Mother   . Coronary artery disease Mother   . Diabetes Unknown        Grandmother  . Coronary artery disease Unknown        Grandmother  . Uterine cancer Unknown        Grandmother  . Leukemia Unknown        Aunt  . Cancer Maternal Aunt        leukemia  . Cancer Maternal Uncle        colon   Allergies  Allergen Reactions  . Codeine Nausea And Vomiting  . Fentanyl Nausea And Vomiting  . Hydromorphone Nausea And Vomiting       . Morphine Nausea And Vomiting  . Tape     Surgical tape  . Prednisone Palpitations    Rapid hear beat   Current Outpatient Medications on File Prior to Visit  Medication Sig Dispense Refill  . aspirin 81 MG tablet Take 81 mg by mouth daily.     Marland Kitchen BIOTIN PO Take by mouth daily.    Marland Kitchen CALCIUM PO Take 1,500 mg by mouth daily.    . Cholecalciferol (VITAMIN D) 2000 UNITS CAPS Take 1 capsule by mouth daily.    . Cyanocobalamin (B-12 PO) Take 2,500 mcg by mouth daily.    . diclofenac sodium (VOLTAREN) 1 % GEL Apply 2 g topically 4 (four) times daily. Rub into affected area  of foot 2 to 4 times daily (Patient taking differently: Apply 2 g topically as needed. Rub into affected area of foot 2 to 4 times daily) 100 g 2  . dicyclomine (BENTYL) 10 MG capsule Take 1 capsule (10 mg total) by mouth 2 (two) times daily as needed for spasms. 30 capsule 2  . fluticasone (FLONASE) 50 MCG/ACT nasal spray TWO SPRAYS IN EACH NOSTRIL EVERY DAY 18 g 5  . LUTEIN PO Take 6 mg by mouth daily.    . Multiple Vitamin (MULTIVITAMIN) capsule Take 1 capsule by mouth daily.      . Naproxen Sodium (ALEVE PO) Take 1 tablet by mouth as needed.    . niacin 500 MG tablet Take 1,000 mg by mouth at bedtime.     Marland Kitchen OVER THE COUNTER MEDICATION Take 1 tablet by mouth daily as needed (allergies). Over the counter allergy medication    . ranitidine (ZANTAC) 150 MG tablet Take 150 mg by mouth as needed for heartburn.      No current facility-administered medications on file prior to visit.     Review  of Systems  Constitutional: Negative for activity change, appetite change, fatigue, fever and unexpected weight change.  HENT: Negative for congestion, ear pain, rhinorrhea, sinus pressure and sore throat.   Eyes: Negative for pain, redness and visual disturbance.  Respiratory: Negative for cough, shortness of breath and wheezing.   Cardiovascular: Negative for chest pain and palpitations.  Gastrointestinal: Negative for abdominal pain, blood in stool, constipation and diarrhea.  Endocrine: Negative for polydipsia and polyuria.  Genitourinary: Negative for dysuria, frequency and urgency.  Musculoskeletal: Negative for arthralgias, back pain and myalgias.  Skin: Negative for pallor and rash.  Allergic/Immunologic: Negative for environmental allergies.  Neurological: Negative for dizziness, syncope and headaches.  Hematological: Negative for adenopathy. Does not bruise/bleed easily.  Psychiatric/Behavioral: Negative for decreased concentration and dysphoric mood. The patient is not nervous/anxious.         Objective:   Physical Exam  Constitutional: She appears well-developed and well-nourished. No distress.  Well appearing   HENT:  Head: Normocephalic and atraumatic.  Right Ear: External ear normal.  Left Ear: External ear normal.  Nose: Nose normal.  Mouth/Throat: Oropharynx is clear and moist.  Eyes: Pupils are equal, round, and reactive to light. Conjunctivae and EOM are normal. Right eye exhibits no discharge. Left eye exhibits no discharge. No scleral icterus.  Neck: Normal range of motion. Neck supple. No JVD present. Carotid bruit is not present. No thyromegaly present.  Cardiovascular: Normal rate, regular rhythm, normal heart sounds and intact distal pulses. Exam reveals no gallop.  Pulmonary/Chest: Effort normal and breath sounds normal. No respiratory distress. She has no wheezes. She has no rales.  Abdominal: Soft. Bowel sounds are normal. She exhibits no distension and no mass. There is no tenderness.  Musculoskeletal: She exhibits no edema or tenderness.  No kyphosis   Lymphadenopathy:    She has no cervical adenopathy.  Neurological: She is alert. She has normal reflexes. No cranial nerve deficit. She exhibits normal muscle tone. Coordination normal.  Skin: Skin is warm and dry. No rash noted. No erythema. No pallor.  Solar lentigines diffusely SKs on trunk   Psychiatric: She has a normal mood and affect.  Very good mood  Mentally sharp           Assessment & Plan:   Problem List Items Addressed This Visit      Endocrine   Hypothyroidism    Hypothyroidism  Pt has no clinical changes No change in energy level/ hair or skin/ edema and no tremor Lab Results  Component Value Date   TSH 0.87 05/26/2018          Relevant Medications   levothyroxine (SYNTHROID, LEVOTHROID) 100 MCG tablet     Musculoskeletal and Integument   Osteoporosis    Has dexa scheduled for aug On fosamax since 2016 No falls or fx On vit d and exercising       Relevant  Medications   alendronate (FOSAMAX) 70 MG tablet     Other   RESOLVED: Anxious mood    Much improved now that her mother is in SNF Plans to come off zoloft      Caregiver stress    Much improved now that mother is in SNF      Pure hypercholesterolemia    Cholesterol is up  Disc goals for lipids and reasons to control them Rev last labs with pt Rev low sat fat diet in detail Diet is excellent -handouts given Consider statin in the future if no imp  Relevant Medications   chlorthalidone (HYGROTON) 50 MG tablet   Routine general medical examination at a health care facility - Primary    Reviewed health habits including diet and exercise and skin cancer prevention Reviewed appropriate screening tests for age  Also reviewed health mt list, fam hx and immunization status , as well as social and family history   See HPI Labs rev  amw rev Overall doing well  Mammogram and dexa planned Colonoscopy planned Gyn f/u and oncology f/u planned  Will continue to work on diet for cholesterol

## 2018-06-03 NOTE — Assessment & Plan Note (Signed)
Cholesterol is up  Disc goals for lipids and reasons to control them Rev last labs with pt Rev low sat fat diet in detail Diet is excellent -handouts given Consider statin in the future if no imp

## 2018-06-06 DIAGNOSIS — C44719 Basal cell carcinoma of skin of left lower limb, including hip: Secondary | ICD-10-CM | POA: Diagnosis not present

## 2018-06-06 DIAGNOSIS — C44712 Basal cell carcinoma of skin of right lower limb, including hip: Secondary | ICD-10-CM | POA: Diagnosis not present

## 2018-06-11 ENCOUNTER — Telehealth: Payer: Self-pay | Admitting: Oncology

## 2018-06-11 NOTE — Progress Notes (Signed)
I reviewed health advisor's note, was available for consultation, and agree with documentation and plan.  

## 2018-06-11 NOTE — Telephone Encounter (Signed)
Called regarding date change she may need to call back

## 2018-06-17 ENCOUNTER — Telehealth: Payer: Self-pay | Admitting: Family Medicine

## 2018-06-17 DIAGNOSIS — Z01419 Encounter for gynecological examination (general) (routine) without abnormal findings: Secondary | ICD-10-CM | POA: Diagnosis not present

## 2018-06-17 DIAGNOSIS — Z6824 Body mass index (BMI) 24.0-24.9, adult: Secondary | ICD-10-CM | POA: Diagnosis not present

## 2018-06-17 MED ORDER — ATORVASTATIN CALCIUM 10 MG PO TABS: 10.0000 mg | ORAL_TABLET | Freq: Every day | ORAL | 3 refills | Status: DC

## 2018-06-17 NOTE — Telephone Encounter (Signed)
[facility]  Take 1 pill daily in evening with a low fat snack   Re check lipid/ast/alt in about 6 wk please  Thanks

## 2018-06-17 NOTE — Telephone Encounter (Signed)
Pt notified Rx sent and advised of Dr. Marliss Coots comments and instructions and lab appt scheduled

## 2018-06-17 NOTE — Telephone Encounter (Signed)
Copied from [place] [phone]. Topic: General - Other >> Jun 17, 2018 10:31 AM Lennox Solders wrote: Reason for CRM: Pt is calling and due to elevated chole was told to eating certain foods etc. Pt has IBS and would like to try low dose chole med. [facility] in [place]. Pt also wanted md to know her appointment with dr Helane Rima everything went well

## 2018-06-24 DIAGNOSIS — Z86010 Personal history of colonic polyps: Secondary | ICD-10-CM | POA: Diagnosis not present

## 2018-07-07 ENCOUNTER — Other Ambulatory Visit: Payer: Self-pay | Admitting: Family Medicine

## 2018-07-09 ENCOUNTER — Ambulatory Visit
Admission: RE | Admit: 2018-07-09 | Discharge: 2018-07-09 | Disposition: A | Discharge status: Home / Self Care | Payer: PPO | Source: Ambulatory Visit | Attending: Oncology | Admitting: Oncology

## 2018-07-09 ENCOUNTER — Ambulatory Visit
Admission: RE | Admit: 2018-07-09 | Discharge: 2018-07-09 | Disposition: A | Discharge status: Home / Self Care | Payer: PPO | Source: Ambulatory Visit | Attending: Family Medicine | Admitting: Family Medicine

## 2018-07-09 ENCOUNTER — Other Ambulatory Visit: Payer: Self-pay | Admitting: Family Medicine

## 2018-07-09 DIAGNOSIS — Z853 Personal history of malignant neoplasm of breast: Secondary | ICD-10-CM

## 2018-07-09 DIAGNOSIS — E2839 Other primary ovarian failure: Secondary | ICD-10-CM

## 2018-07-09 DIAGNOSIS — M810 Age-related osteoporosis without current pathological fracture: Secondary | ICD-10-CM | POA: Diagnosis not present

## 2018-07-09 DIAGNOSIS — Z1231 Encounter for screening mammogram for malignant neoplasm of breast: Secondary | ICD-10-CM

## 2018-07-09 DIAGNOSIS — Z9889 Other specified postprocedural states: Secondary | ICD-10-CM

## 2018-07-14 ENCOUNTER — Ambulatory Visit
Admission: RE | Admit: 2018-07-14 | Discharge: 2018-07-14 | Disposition: A | Discharge status: Home / Self Care | Payer: PPO | Source: Ambulatory Visit | Attending: Family Medicine | Admitting: Family Medicine

## 2018-07-14 DIAGNOSIS — R922 Inconclusive mammogram: Secondary | ICD-10-CM | POA: Diagnosis not present

## 2018-07-14 DIAGNOSIS — Z9889 Other specified postprocedural states: Secondary | ICD-10-CM

## 2018-07-14 DIAGNOSIS — Z853 Personal history of malignant neoplasm of breast: Secondary | ICD-10-CM

## 2018-07-24 ENCOUNTER — Ambulatory Visit: Payer: PPO | Admitting: Oncology

## 2018-07-28 ENCOUNTER — Other Ambulatory Visit (INDEPENDENT_AMBULATORY_CARE_PROVIDER_SITE_OTHER): Payer: PPO

## 2018-07-28 DIAGNOSIS — M810 Age-related osteoporosis without current pathological fracture: Secondary | ICD-10-CM

## 2018-07-28 DIAGNOSIS — E7800 Pure hypercholesterolemia, unspecified: Secondary | ICD-10-CM

## 2018-07-28 DIAGNOSIS — Z0000 Encounter for general adult medical examination without abnormal findings: Secondary | ICD-10-CM | POA: Diagnosis not present

## 2018-07-28 DIAGNOSIS — E038 Other specified hypothyroidism: Secondary | ICD-10-CM

## 2018-07-28 LAB — LIPID PANEL
CHOLESTEROL: 165 mg/dL (ref 0–200)
HDL: 65.4 mg/dL (ref >39.00)
LDL CALC: 82 mg/dL (ref 0–99)
NonHDL: 99.31
TRIGLYCERIDES: 89 mg/dL (ref 0.0–149.0)
Total CHOL/HDL Ratio: 3
VLDL: 17.8 mg/dL (ref 0.0–40.0)

## 2018-07-28 LAB — COMPREHENSIVE METABOLIC PANEL
ALBUMIN: 4.5 g/dL (ref 3.5–5.2)
ALK PHOS: 56 U/L (ref 39–117)
ALT: 12 U/L (ref 0–35)
AST: 19 U/L (ref 0–37)
BUN: 19 mg/dL (ref 6–23)
CALCIUM: 10.4 mg/dL (ref 8.4–10.5)
CHLORIDE: 100 meq/L (ref 96–112)
CO2: 32 mEq/L (ref 19–32)
Creatinine, Ser: 0.93 mg/dL (ref 0.40–1.20)
GFR: 62.43 mL/min (ref >60.00)
Glucose, Bld: 94 mg/dL (ref 70–99)
POTASSIUM: 3.5 meq/L (ref 3.5–5.1)
SODIUM: 140 meq/L (ref 135–145)
TOTAL PROTEIN: 7.4 g/dL (ref 6.0–8.3)
Total Bilirubin: 0.9 mg/dL (ref 0.2–1.2)

## 2018-07-28 LAB — CBC WITH DIFFERENTIAL/PLATELET
BASOS PCT: 0.3 % (ref 0.0–3.0)
Basophils Absolute: 0 10*3/uL (ref 0.0–0.1)
EOS ABS: 0.1 10*3/uL (ref 0.0–0.7)
EOS PCT: 1.2 % (ref 0.0–5.0)
HEMATOCRIT: 43 % (ref 36.0–46.0)
HEMOGLOBIN: 14.7 g/dL (ref 12.0–15.0)
LYMPHS PCT: 34.8 % (ref 12.0–46.0)
Lymphs Abs: 2.6 10*3/uL (ref 0.7–4.0)
MCHC: 34.1 g/dL (ref 30.0–36.0)
MCV: 89.8 fl (ref 78.0–100.0)
MONO ABS: 0.7 10*3/uL (ref 0.1–1.0)
Monocytes Relative: 9.6 % (ref 3.0–12.0)
Neutro Abs: 4 10*3/uL (ref 1.4–7.7)
Neutrophils Relative %: 54.1 % (ref 43.0–77.0)
Platelets: 197 10*3/uL (ref 150.0–400.0)
RBC: 4.79 Mil/uL (ref 3.87–5.11)
RDW: 13.9 % (ref 11.5–15.5)
WBC: 7.5 10*3/uL (ref 4.0–10.5)

## 2018-07-28 LAB — VITAMIN D 25 HYDROXY (VIT D DEFICIENCY, FRACTURES): VITD: 64.26 ng/mL (ref 30.00–100.00)

## 2018-07-28 LAB — TSH: TSH: 0.72 u[IU]/mL (ref 0.35–4.50)

## 2018-07-29 ENCOUNTER — Other Ambulatory Visit: Payer: PPO

## 2018-07-29 ENCOUNTER — Ambulatory Visit: Payer: PPO | Admitting: Oncology

## 2018-08-05 ENCOUNTER — Telehealth: Payer: Self-pay | Admitting: Oncology

## 2018-08-05 NOTE — Telephone Encounter (Signed)
GM PAL - per GM moved 9/12 f/u to 9/9 with lab. Spoke with patient she is aware.

## 2018-08-08 ENCOUNTER — Other Ambulatory Visit: Payer: Self-pay | Admitting: *Deleted

## 2018-08-08 DIAGNOSIS — Z853 Personal history of malignant neoplasm of breast: Secondary | ICD-10-CM

## 2018-08-10 NOTE — Progress Notes (Signed)
[place]  [phone] [phone]     ID: Gabrielle Webb DOB: 04-Oct-1941  MR#: [id number]  WCB#:[id number]  Patient Care Team: Abner Greenspan, MD as PCP - General (Family Medicine) Magrinat, Virgie Dad, MD as Consulting Physician (Oncology) Dian Queen, MD as Consulting Physician (Obstetrics and Gynecology) Eula Flax, OD as Referring Physician (Ophthalmology) Clyde Canterbury, MD as Referring Physician (Otolaryngology) Oneta Rack, MD as Consulting Physician (Dermatology) Ocie Cornfield, DDS as Consulting Physician (Dentistry) Rockey Situ, Kathlene November, MD as Consulting Physician (Cardiology) PCP: Abner Greenspan, MD OTHER MD: Deitra Mayo MD  CHIEF COMPLAINT: Estrogen receptor positive breast cancer  CURRENT TREATMENT: Observation   BREAST CANCER HISTORY: From Dr. Eusebio Me initial intake note 05/10/2011:  "Gabrielle Webb is a pleasant 77 year old female.  She underwent a screening mammogram on 04/20/2011 and was found to have an asymmetric spiculated mass in the upper outer quadrant.  Ultrasound confirmed the presence of this mass, and a biopsy on 05/02/2011 revealed a low-grade invasive ductal carcinoma.  This measured 11 mm and was ER, PR positive, HER2 negative and Ki-67 was 15%.  An MRI of the bilateral breasts was performed on 05/07/2011.  This showed a 1.5 x 1.0 cm mass.  Gabrielle Webb notes a single episode of possible stinging in her right breast prior to her mammogram.  She has a long history of fibrocystic disease though, so she did not feel anything was greatly out of the ordinary.  Interestingly, she did have a history of several breast biopsies and excisions in 1987 and 1988.  She brings these old clinic records with her, and it looks like some fibrocystic disease was removed as well as an area of atypical cells.  The differential is quite broad, but it is possible given that she has developed this in a very similar area that this  was LCIS.  Regardless, at that time when she had those biopsies, she had been on hormone replacement therapy for about a month and took herself off."  Her subsequent history is as detailed below  INTERVAL HISTORY: Gabrielle Webb returns today for follow-up of her history of estrogen receptor positive breast cancer. She continues under observation.   Since her last visit, she underwent diagnostic bilateral mammography with CAD and tomography on 07/14/2018 at [facility] showing: breast density category C. There was no evidence of malignancy.   She also completed a bone density on 07/09/2018 which showed a T score of -2.8 osteoporosis  She also had a repeat lipid panel by Dr. Glori Bickers.  The patient is very pleased with the results    REVIEW OF SYSTEMS: Gabrielle Webb has started walking at the gym. She tries to go 3 days per week when she can. During this year, she was taking care of her 58 y.o. mother who sustained injuries from a fall. Her mother is now living at [employer]. She denies unusual headaches, visual changes, nausea, vomiting, or dizziness. There has been no unusual cough, phlegm production, or pleurisy. There has been no change in bowel or bladder habits. She denies unexplained fatigue or unexplained weight loss, bleeding, rash, or fever. A detailed review of systems was otherwise stable.    PAST MEDICAL HISTORY: Past Medical History:  Diagnosis Date  . Allergy   . Anxiety   . Breast cancer ([place]) 7/12   Right  . Cellulitis   . Diverticulosis of colon   . GERD (gastroesophageal reflux disease)   . Headache    sinus  .  History of hiatal hernia   . Hx of radiation therapy 06/20/11 - 07/11/11   right breast  . Hypothyroid   . Osteoporosis   . Personal history of radiation therapy 2012   Right Breast Cancer  . PONV (postoperative nausea and vomiting)   . Skin cancer    basal and squamous cell  . Vertigo     PAST SURGICAL HISTORY: Past Surgical History:    Procedure Laterality Date  . ABDOMINAL HYSTERECTOMY N/A 11/08/2016   Procedure: HYSTERECTOMY TOTAL  ABDOMINAL;  Surgeon: Dian Queen, MD;  Location: [facility];  Service: Gynecology;  Laterality: N/A;  . APPENDECTOMY  07/2007  . back sugery  1970  . BREAST BIOPSY Left    Benign  . BREAST EXCISIONAL BIOPSY Left 1983  . BREAST EXCISIONAL BIOPSY Left 1983  . BREAST LUMPECTOMY Right 05/16/2011  . COLONOSCOPY    . DILATION AND CURETTAGE OF UTERUS N/A 05/31/2014   Procedure: DILATATION AND CURETTAGE WITH ULTRASOUND GUIDANCE;  Surgeon: Cyril Mourning, MD;  Location: [facility];  Service: Gynecology;  Laterality: N/A;  . [place]  . right eye surgery    . SALPINGOOPHORECTOMY Bilateral 11/08/2016   Procedure: BILATERAL SALPINGO OOPHORECTOMY;  Surgeon: Dian Queen, MD;  Location: [facility];  Service: Gynecology;  Laterality: Bilateral;  . UPPER GI ENDOSCOPY      FAMILY HISTORY Family History  Problem Relation Age of Onset  . Heart attack Father 87  . Hypertension Father   . Atrial fibrillation Mother   . Coronary artery disease Mother   . Diabetes Unknown        Grandmother  . Coronary artery disease Unknown        Grandmother  . Uterine cancer Unknown        Grandmother  . Leukemia Unknown        Aunt  . Cancer Maternal Aunt        leukemia  . Cancer Maternal Uncle        colon   the patient's father died at the age of 10 from a myocardial infarction. The patient's mother is 69 years old currently. She has atrial fibrillation. She lives at [facility]. The patient is an only child. There is no history of breast or ovarian cancer in the family to her knowledge  GYNECOLOGIC HISTORY:  Patient's last menstrual period was 12/03/1988. Menarche age 88 first live birth age 89. She stopped having periods in her late 53s. 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 Her husband died from  widely metastatic cancer of unknown primary (it was never biopsied) approximately a year ago. She lives alone in a [place], with 2 cats. Her son Gabrielle Webb lives in [place]. He works out of his home for [employer]. The patient has 3 grandchildren, the oldest 66, 65, his micro-cephalic, the others are 39 and 75 years old. The patient attends a local church in [place]: In place. The patient's son Gabrielle Fortis "[name] " [name] is her healthcare power of attorney. He can be reached at [phone].   HEALTH MAINTENANCE: Social History   Tobacco Use  . Smoking status: Never Smoker  . Smokeless tobacco: Never Used  Substance Use Topics  . Alcohol use: No    Alcohol/week: 0.0 standard drinks  . Drug use: No     Colonoscopy: planned for 08/27/2018 under Dr. Vira Agar  PAP:  Bone density: 07/09/2018 showed a T score of -2.8 osteoporosis  Lipid panel:  Allergies  Allergen Reactions  . Codeine Nausea And Vomiting  . Fentanyl Nausea And Vomiting  . Hydromorphone Nausea And Vomiting       . Morphine Nausea And Vomiting  . Tape     Surgical tape  . Prednisone Palpitations    Rapid hear beat    Current Outpatient Medications  Medication Sig Dispense Refill  . alendronate (FOSAMAX) 70 MG tablet TAKE 1 TABLET EVERY 7 DAYS WITH A FULL GLASS OF WATER ON AN EMPTY STOMACH DO NOT LIE DOWN FOR AT LEAST 30 MIN 12 tablet 3  . aspirin 81 MG tablet Take 81 mg by mouth daily.     Marland Kitchen atorvastatin (LIPITOR) 10 MG tablet Take 1 tablet (10 mg total) by mouth daily. 90 tablet 3  . BIOTIN PO Take by mouth daily.    Marland Kitchen CALCIUM PO Take 1,500 mg by mouth daily.    . chlorthalidone (HYGROTON) 50 MG tablet Take 1 tablet (50 mg total) by mouth daily as needed. For swelling. 90 tablet 3  . Cholecalciferol (VITAMIN D) 2000 UNITS CAPS Take 1 capsule by mouth daily.    . Cyanocobalamin (B-12 PO) Take 2,500 mcg by mouth daily.    . diclofenac sodium (VOLTAREN) 1 % GEL Apply 2 g topically  4 (four) times daily. Rub into affected area of foot 2 to 4 times daily (Patient taking differently: Apply 2 g topically as needed. Rub into affected area of foot 2 to 4 times daily) 100 g 2  . dicyclomine (BENTYL) 10 MG capsule Take 1 capsule (10 mg total) by mouth 2 (two) times daily as needed for spasms. 30 capsule 2  . fluticasone (FLONASE) 50 MCG/ACT nasal spray TWO SPRAYS IN EACH NOSTRIL EVERY DAY 18 g 5  . levothyroxine (SYNTHROID, LEVOTHROID) 100 MCG tablet TAKE ONE TABLET BY MOUTH EVERY DAY 90 tablet 3  . LUTEIN PO Take 6 mg by mouth daily.    . Multiple Vitamin (MULTIVITAMIN) capsule Take 1 capsule by mouth daily.      . Naproxen Sodium (ALEVE PO) Take 1 tablet by mouth as needed.    . niacin 500 MG tablet Take 1,000 mg by mouth at bedtime.     Marland Kitchen OVER THE COUNTER MEDICATION Take 1 tablet by mouth daily as needed (allergies). Over the counter allergy medication    . ranitidine (ZANTAC) 150 MG tablet Take 150 mg by mouth as needed for heartburn.      No current facility-administered medications for this visit.     OBJECTIVE: Middle-aged white woman who appears well  Vitals:   08/11/18 1540  BP: 113/67  Pulse: 73  Resp: 18  Temp: (!) 97.4 F (36.3 C)  SpO2: 100%     Body mass index is 24.73 kg/m.    ECOG FS:0 - Asymptomatic  Sclerae unicteric, pupils round and equal Oropharynx clear and moist No cervical or supraclavicular adenopathy Lungs no rales or rhonchi Heart regular rate and rhythm Abd soft, nontender, positive bowel sounds MSK no focal spinal tenderness, no upper extremity lymphedema Neuro: nonfocal, well oriented, appropriate affect Breasts: The right breast is status post lumpectomy and radiation.  There is no evidence of local recurrence.  The left breast is benign.  Both axillae are benign.  LAB RESULTS:  CMP     Component Value Date/Time   NA 143 08/11/2018 1531   NA 142 07/03/2016 0957   K  3.8 08/11/2018 1531   K 4.1 07/03/2016 0957   CL 105  08/11/2018 1531   CL 109 (H) 09/18/2012 0849   CO2 29 08/11/2018 1531   CO2 27 07/03/2016 0957   GLUCOSE 91 08/11/2018 1531   GLUCOSE 86 07/03/2016 0957   GLUCOSE 79 09/18/2012 0849   BUN 13 08/11/2018 1531   BUN 11.8 07/03/2016 0957   CREATININE 0.83 08/11/2018 1531   CREATININE 0.7 07/03/2016 0957   CALCIUM 10.1 08/11/2018 1531   CALCIUM 9.4 07/03/2016 0957   PROT 7.2 08/11/2018 1531   PROT 6.7 07/03/2016 0957   ALBUMIN 4.2 08/11/2018 1531   ALBUMIN 3.8 07/03/2016 0957   AST 19 08/11/2018 1531   AST 19 07/03/2016 0957   ALT 11 08/11/2018 1531   ALT 12 07/03/2016 0957   ALKPHOS 67 08/11/2018 1531   ALKPHOS 55 07/03/2016 0957   BILITOT 0.5 08/11/2018 1531   BILITOT 0.57 07/03/2016 0957   GFRNONAA >60 08/11/2018 1531   GFRAA >60 08/11/2018 1531    INo results found for: SPEP, UPEP  Lab Results  Component Value Date   WBC 8.1 08/11/2018   NEUTROABS 4.1 08/11/2018   HGB 14.1 08/11/2018   HCT 41.4 08/11/2018   MCV 90.4 08/11/2018   PLT 159 08/11/2018      Chemistry      Component Value Date/Time   NA 143 08/11/2018 1531   NA 142 07/03/2016 0957   K 3.8 08/11/2018 1531   K 4.1 07/03/2016 0957   CL 105 08/11/2018 1531   CL 109 (H) 09/18/2012 0849   CO2 29 08/11/2018 1531   CO2 27 07/03/2016 0957   BUN 13 08/11/2018 1531   BUN 11.8 07/03/2016 0957   CREATININE 0.83 08/11/2018 1531   CREATININE 0.7 07/03/2016 0957      Component Value Date/Time   CALCIUM 10.1 08/11/2018 1531   CALCIUM 9.4 07/03/2016 0957   ALKPHOS 67 08/11/2018 1531   ALKPHOS 55 07/03/2016 0957   AST 19 08/11/2018 1531   AST 19 07/03/2016 0957   ALT 11 08/11/2018 1531   ALT 12 07/03/2016 0957   BILITOT 0.5 08/11/2018 1531   BILITOT 0.57 07/03/2016 0957       Lab Results  Component Value Date   LABCA2 17 05/09/2011    No components found for: [id number]  No results for input(s): INR in the last 168 hours.  Urinalysis    Component Value Date/Time   BILIRUBINUR 1+ 06/25/2014 1208    PROTEINUR 30+ 06/25/2014 1208   UROBILINOGEN 0.2 06/25/2014 1208   NITRITE neg. 06/25/2014 1208   LEUKOCYTESUR moderate (2+) 06/25/2014 1208    STUDIES: Mm Diag Breast Tomo Bilateral  Result Date: 07/14/2018 CLINICAL DATA:  History of right breast cancer status post lumpectomy in 2012. EXAM: DIGITAL DIAGNOSTIC BILATERAL MAMMOGRAM WITH CAD AND TOMO COMPARISON:  Previous exam(s). ACR Breast Density Category c: The breast tissue is heterogeneously dense, which may obscure small masses. FINDINGS: Stable lumpectomy changes are seen in the right breast. No suspicious mass or microcalcifications identified in either breast. Mammographic images were processed with CAD. IMPRESSION: No evidence of malignancy in either breast. RECOMMENDATION: Bilateral screening mammogram in 1 year is recommended. I have discussed the findings and recommendations with the patient. Results were also provided in writing at the conclusion of the visit. If applicable, a reminder letter will be sent to the patient regarding the next appointment. BI-RADS CATEGORY  2: Benign. Electronically Signed   By: Georgiana Shore.D.  On: 07/14/2018 08:24    ASSESSMENT: 77 y.o.  [place] woman status post right breast upper outer quadrant biopsy 05/02/2011 for a low-grade invasive ductal carcinoma which was estrogen receptor 100% positive, progesterone receptor 22% positive, with an MIB-1 of 17% and no HER-2 amplification, the signals ratio being 1.68  ([id number])  2. Status post right lumpectomy and sentinel lymph node sampling 05/16/2011 for a pT1c pN0, stage IA invasive ductal carcinoma, low-grade, with negative margins  3. Oncotype DX score of 13 predicted an 8% risk of recurrence outside the breast within 10 years if the patient's only systemic therapy is tamoxifen for 5 years. He also predicted no benefit from chemotherapy.  3. Adjuvant radiation completed 07/23/2011  4. Tamoxifen started August 2012, discontinued June 2015  5.  Letrozole started August 2015, discontinued August 2017  (a) alendronate started August 2015  (b) [facility] density at the [facility] 12/01/2015 shows osteoporosis with a T score of -2.5  (c) [facility] 07/09/2018 showed a T score of -2.8 osteoporosis  6. S/p TAH-BSO December 2017, with benign pathology.  PLAN: Gabrielle Webb is now a little over 7 years out from definitive surgery for breast cancer with no evidence of disease recurrence.  This is very favorable.  She was very concerned about her cholesterol and we went over that in detail.  She is having an excellent response to statins.  She did not want her mammogram switched from screening to diagnostic.  We discussed that at length today.  It is important that she get tomography with which have her mammogram she decides to get this coming year.  Her bone density is a little lower.  She is already on good vitamin D supplementation but she had not been walking regularly.  She is now doing that.  That will certainly help.  The question is whether she should switch from Fosamax to Prolia or Reclast.  She will discuss that with Dr. Glori Bickers at the next visit with her.  Gabrielle Webb will return to see me in a year.  She knows to call for any problems that may develop before the next visit.  Magrinat, Virgie Dad, MD  08/11/18 4:15 PM Medical Oncology and Hematology [facility] [address] [place], [place] [address] Tel. [phone]    Fax. [phone]  Alice Rieger, am acting as scribe for Chauncey Cruel MD.  I, Lurline Del MD, have reviewed the above documentation for accuracy and completeness, and I agree with the above.

## 2018-08-11 ENCOUNTER — Inpatient Hospital Stay (HOSPITAL_BASED_OUTPATIENT_CLINIC_OR_DEPARTMENT_OTHER): Payer: PPO | Admitting: Oncology

## 2018-08-11 ENCOUNTER — Telehealth: Payer: Self-pay | Admitting: Oncology

## 2018-08-11 ENCOUNTER — Inpatient Hospital Stay: Payer: PPO | Attending: Oncology

## 2018-08-11 VITALS — BP 113/67 | HR 73 | Temp 97.4°F | Resp 18 | Ht 63.0 in | Wt 139.6 lb

## 2018-08-11 DIAGNOSIS — Z8049 Family history of malignant neoplasm of other genital organs: Secondary | ICD-10-CM | POA: Diagnosis not present

## 2018-08-11 DIAGNOSIS — Z853 Personal history of malignant neoplasm of breast: Secondary | ICD-10-CM | POA: Insufficient documentation

## 2018-08-11 DIAGNOSIS — Z806 Family history of leukemia: Secondary | ICD-10-CM

## 2018-08-11 DIAGNOSIS — Z923 Personal history of irradiation: Secondary | ICD-10-CM

## 2018-08-11 DIAGNOSIS — Z800 Family history of malignant neoplasm of digestive organs: Secondary | ICD-10-CM | POA: Insufficient documentation

## 2018-08-11 DIAGNOSIS — Z85828 Personal history of other malignant neoplasm of skin: Secondary | ICD-10-CM | POA: Diagnosis not present

## 2018-08-11 DIAGNOSIS — M810 Age-related osteoporosis without current pathological fracture: Secondary | ICD-10-CM | POA: Insufficient documentation

## 2018-08-11 DIAGNOSIS — C50411 Malignant neoplasm of upper-outer quadrant of right female breast: Secondary | ICD-10-CM

## 2018-08-11 DIAGNOSIS — Z90710 Acquired absence of both cervix and uterus: Secondary | ICD-10-CM | POA: Insufficient documentation

## 2018-08-11 DIAGNOSIS — Z170 Estrogen receptor positive status [ER+]: Principal | ICD-10-CM

## 2018-08-11 LAB — CBC WITH DIFFERENTIAL (CANCER CENTER ONLY)
Basophils Absolute: 0 10*3/uL (ref 0.0–0.1)
Basophils Relative: 0 %
EOS ABS: 0.1 10*3/uL (ref 0.0–0.5)
Eosinophils Relative: 2 %
HEMATOCRIT: 41.4 % (ref 34.8–46.6)
HEMOGLOBIN: 14.1 g/dL (ref 11.6–15.9)
LYMPHS ABS: 3.3 10*3/uL (ref 0.9–3.3)
LYMPHS PCT: 40 %
MCH: 30.8 pg (ref 25.1–34.0)
MCHC: 34.1 g/dL (ref 31.5–36.0)
MCV: 90.4 fL (ref 79.5–101.0)
MONOS PCT: 8 %
Monocytes Absolute: 0.7 10*3/uL (ref 0.1–0.9)
NEUTROS PCT: 50 %
Neutro Abs: 4.1 10*3/uL (ref 1.5–6.5)
Platelet Count: 159 10*3/uL (ref 145–400)
RBC: 4.58 MIL/uL (ref 3.70–5.45)
RDW: 13.1 % (ref 11.2–14.5)
WBC: 8.1 10*3/uL (ref 3.9–10.3)

## 2018-08-11 LAB — CMP (CANCER CENTER ONLY)
ALT: 11 U/L (ref 0–44)
AST: 19 U/L (ref 15–41)
Albumin: 4.2 g/dL (ref 3.5–5.0)
Alkaline Phosphatase: 67 U/L (ref 38–126)
Anion gap: 9 (ref 5–15)
BUN: 13 mg/dL (ref 8–23)
CHLORIDE: 105 mmol/L (ref 98–111)
CO2: 29 mmol/L (ref 22–32)
Calcium: 10.1 mg/dL (ref 8.9–10.3)
Creatinine: 0.83 mg/dL (ref 0.44–1.00)
Glucose, Bld: 91 mg/dL (ref 70–99)
Potassium: 3.8 mmol/L (ref 3.5–5.1)
SODIUM: 143 mmol/L (ref 135–145)
Total Bilirubin: 0.5 mg/dL (ref 0.3–1.2)
Total Protein: 7.2 g/dL (ref 6.5–8.1)

## 2018-08-11 NOTE — Telephone Encounter (Signed)
Gave avs and calendar ° °

## 2018-08-14 ENCOUNTER — Other Ambulatory Visit: Payer: PPO

## 2018-08-14 ENCOUNTER — Ambulatory Visit: Payer: PPO | Admitting: Oncology

## 2018-08-20 ENCOUNTER — Telehealth: Payer: Self-pay | Admitting: Family Medicine

## 2018-08-20 NOTE — Telephone Encounter (Signed)
She can try taking it during the day Please let Gabrielle Webb know about the desire for prolia

## 2018-08-20 NOTE — Telephone Encounter (Signed)
Copied from [place] [id number]. Topic: General - Other >> Aug 20, 2018 11:45 AM Bea Graff, NT wrote: Reason for CRM: Pt states when she went to see Dr. Young Berry that he told her to talk with Dr. Glori Bickers about reclast or prolia injections to see which one she would recommend for the patient to receive. Pt requesting a call back to discuss.

## 2018-08-20 NOTE — Telephone Encounter (Signed)
reclast is in the bisphosphonate class-she has already had a full course of it  I prefer Prolia  We have to see if we can get it covered  I will message Earl Lagos if she is interested

## 2018-08-20 NOTE — Telephone Encounter (Signed)
Pt notified of Dr. Marliss Webb comments and she is okay with [name] seeing if she qualifies for Prolia, I advise her Gabrielle Webb will f/u with her once we get the info back from her insurance.   Pt did have a question for Dr. Glori Webb, she said that since starting the Lipitor she has had more GERD at night, pt said that the pharmacist advise her to take med at night but she wanted to see if it's okay to take it during the day so she isn't taking med then laying down soon after that.

## 2018-08-20 NOTE — Telephone Encounter (Signed)
Pt notified of Dr. Marliss Coots comments and verbalized understanding.  Message routed to [facility] so she can start the process for Prolia

## 2018-08-22 ENCOUNTER — Telehealth: Payer: Self-pay | Admitting: *Deleted

## 2018-08-22 NOTE — Telephone Encounter (Signed)
See additional 9/20 phone note

## 2018-08-22 NOTE — Telephone Encounter (Signed)
Information has been submitted to pts insurance for verification of benefits. Awaiting response for coverage  

## 2018-08-26 ENCOUNTER — Encounter: Payer: Self-pay | Admitting: *Deleted

## 2018-08-27 ENCOUNTER — Ambulatory Visit: Payer: PPO | Admitting: Certified Registered Nurse Anesthetist

## 2018-08-27 ENCOUNTER — Encounter
Admission: RE | Disposition: A | Discharge status: Home / Self Care | Payer: Self-pay | Source: Ambulatory Visit | Attending: Unknown Physician Specialty

## 2018-08-27 ENCOUNTER — Ambulatory Visit
Admission: RE | Admit: 2018-08-27 | Discharge: 2018-08-27 | Disposition: A | Discharge status: Home / Self Care | Payer: PPO | Source: Ambulatory Visit | Attending: Unknown Physician Specialty | Admitting: Unknown Physician Specialty

## 2018-08-27 DIAGNOSIS — E039 Hypothyroidism, unspecified: Secondary | ICD-10-CM | POA: Diagnosis not present

## 2018-08-27 DIAGNOSIS — K635 Polyp of colon: Secondary | ICD-10-CM | POA: Diagnosis not present

## 2018-08-27 DIAGNOSIS — Z7983 Long term (current) use of bisphosphonates: Secondary | ICD-10-CM | POA: Diagnosis not present

## 2018-08-27 DIAGNOSIS — Z79890 Hormone replacement therapy (postmenopausal): Secondary | ICD-10-CM | POA: Insufficient documentation

## 2018-08-27 DIAGNOSIS — Z791 Long term (current) use of non-steroidal anti-inflammatories (NSAID): Secondary | ICD-10-CM | POA: Insufficient documentation

## 2018-08-27 DIAGNOSIS — Z7982 Long term (current) use of aspirin: Secondary | ICD-10-CM | POA: Insufficient documentation

## 2018-08-27 DIAGNOSIS — Z7951 Long term (current) use of inhaled steroids: Secondary | ICD-10-CM | POA: Insufficient documentation

## 2018-08-27 DIAGNOSIS — Z79899 Other long term (current) drug therapy: Secondary | ICD-10-CM | POA: Diagnosis not present

## 2018-08-27 DIAGNOSIS — Z85828 Personal history of other malignant neoplasm of skin: Secondary | ICD-10-CM | POA: Insufficient documentation

## 2018-08-27 DIAGNOSIS — D122 Benign neoplasm of ascending colon: Secondary | ICD-10-CM | POA: Insufficient documentation

## 2018-08-27 DIAGNOSIS — K219 Gastro-esophageal reflux disease without esophagitis: Secondary | ICD-10-CM | POA: Insufficient documentation

## 2018-08-27 DIAGNOSIS — Z09 Encounter for follow-up examination after completed treatment for conditions other than malignant neoplasm: Secondary | ICD-10-CM | POA: Diagnosis not present

## 2018-08-27 DIAGNOSIS — D120 Benign neoplasm of cecum: Secondary | ICD-10-CM | POA: Insufficient documentation

## 2018-08-27 DIAGNOSIS — Z86010 Personal history of colonic polyps: Secondary | ICD-10-CM | POA: Diagnosis not present

## 2018-08-27 DIAGNOSIS — K5730 Diverticulosis of large intestine without perforation or abscess without bleeding: Secondary | ICD-10-CM | POA: Insufficient documentation

## 2018-08-27 DIAGNOSIS — Z923 Personal history of irradiation: Secondary | ICD-10-CM | POA: Diagnosis not present

## 2018-08-27 DIAGNOSIS — K5790 Diverticulosis of intestine, part unspecified, without perforation or abscess without bleeding: Secondary | ICD-10-CM | POA: Diagnosis not present

## 2018-08-27 DIAGNOSIS — Z8049 Family history of malignant neoplasm of other genital organs: Secondary | ICD-10-CM | POA: Insufficient documentation

## 2018-08-27 DIAGNOSIS — M810 Age-related osteoporosis without current pathological fracture: Secondary | ICD-10-CM | POA: Diagnosis not present

## 2018-08-27 DIAGNOSIS — K640 First degree hemorrhoids: Secondary | ICD-10-CM | POA: Diagnosis not present

## 2018-08-27 DIAGNOSIS — Z853 Personal history of malignant neoplasm of breast: Secondary | ICD-10-CM | POA: Insufficient documentation

## 2018-08-27 DIAGNOSIS — Z1211 Encounter for screening for malignant neoplasm of colon: Secondary | ICD-10-CM | POA: Diagnosis not present

## 2018-08-27 HISTORY — PX: COLONOSCOPY WITH PROPOFOL: SHX5780

## 2018-08-27 SURGERY — COLONOSCOPY WITH PROPOFOL
Anesthesia: General

## 2018-08-27 MED ORDER — LIDOCAINE HCL (CARDIAC) PF 100 MG/5ML IV SOSY
PREFILLED_SYRINGE | INTRAVENOUS | Status: DC | PRN
  Administered 2018-08-27: 50 mg via INTRAVENOUS

## 2018-08-27 MED ORDER — SODIUM CHLORIDE 0.9 % IV SOLN: INTRAVENOUS | Status: DC

## 2018-08-27 MED ORDER — PROPOFOL 10 MG/ML IV BOLUS
INTRAVENOUS | Status: DC | PRN
  Administered 2018-08-27: 50 mg via INTRAVENOUS

## 2018-08-27 MED ORDER — SODIUM CHLORIDE 0.9 % IV SOLN
INTRAVENOUS | Status: DC
  Administered 2018-08-27: 08:00:00 via INTRAVENOUS

## 2018-08-27 MED ORDER — PROPOFOL 500 MG/50ML IV EMUL
INTRAVENOUS | Status: AC
  Filled 2018-08-27: qty 50

## 2018-08-27 MED ORDER — PROPOFOL 500 MG/50ML IV EMUL
INTRAVENOUS | Status: DC | PRN
  Administered 2018-08-27: 175 ug/kg/min via INTRAVENOUS

## 2018-08-27 NOTE — Op Note (Signed)
[facility] Gastroenterology Patient Name: Gabrielle Webb Procedure Date: 08/27/2018 8:04 AM MRN: [id number] Account #: [id number] Date of Birth: [birth year] Admit Type: Outpatient Age: 77 Room: [facility] ENDO ROOM 3 Gender: Female Note Status: Finalized Procedure:            Colonoscopy Indications:          High risk colon cancer surveillance: Personal history                        of colonic polyps Providers:            Manya Silvas, MD Referring MD:         Wynelle Fanny. [place] (Referring MD) Medicines:            Propofol per Anesthesia Complications:        No immediate complications. Procedure:            Pre-Anesthesia Assessment:                       - After reviewing the risks and benefits, the patient                        was deemed in satisfactory condition to undergo the                        procedure.                       After obtaining informed consent, the colonoscope was                        passed under direct vision. Throughout the procedure,                        the patient's blood pressure, pulse, and oxygen                        saturations were monitored continuously. The was                        introduced through the anus and advanced to the the                        cecum, identified by appendiceal orifice and ileocecal                        valve. The colonoscopy was performed without                        difficulty. The patient tolerated the procedure well.                        The quality of the bowel preparation was good. Findings:      A diminutive polyp was found in the cecum. The polyp was sessile. The       polyp was removed with a jumbo cold forceps. Resection and retrieval       were complete. To prevent bleeding after the polypectomy, one hemostatic       clip was successfully placed. There was no bleeding at the end of the       procedure.  Two sessile polyps were found in the ascending colon. The polyps  were       diminutive in size. These polyps were removed with a jumbo cold forceps.       Resection and retrieval were complete.      Multiple small-mouthed diverticula were found in the sigmoid colon and       descending colon.      Internal hemorrhoids were found during endoscopy. The hemorrhoids were       small and Grade I (internal hemorrhoids that do not prolapse). Impression:           - One diminutive polyp in the cecum, removed with a                        jumbo cold forceps. Resected and retrieved. Clip was                        placed.                       - Two diminutive polyps in the ascending colon, removed                        with a jumbo cold forceps. Resected and retrieved.                       - Diverticulosis in the sigmoid colon and in the                        descending colon.                       - Internal hemorrhoids. Recommendation:       - Await pathology results. Manya Silvas, MD 08/27/2018 8:52:47 AM This report has been signed electronically. Number of Addenda: 0 Note Initiated On: 08/27/2018 8:04 AM Scope Withdrawal Time: 0 hours 18 minutes 36 seconds  Total Procedure Duration: 0 hours 28 minutes 32 seconds       [facility]

## 2018-08-27 NOTE — Anesthesia Post-op Follow-up Note (Signed)
Anesthesia QCDR form completed.        

## 2018-08-27 NOTE — Transfer of Care (Signed)
Immediate Anesthesia Transfer of Care Note  Patient: Gabrielle Webb  Procedure(s) Performed: COLONOSCOPY WITH PROPOFOL (N/A )  Patient Location: PACU  Anesthesia Type:General  Level of Consciousness: sedated  Airway & Oxygen Therapy: Patient Spontanous Breathing and Patient connected to nasal cannula oxygen  Post-op Assessment: Report given to RN and Post -op Vital signs reviewed and stable  Post vital signs: Reviewed and stable  Last Vitals:  Vitals Value Taken Time  BP 89/46 08/27/2018  8:54 AM  Temp 36.1 C 08/27/2018  8:52 AM  Pulse 58 08/27/2018  8:54 AM  Resp 14 08/27/2018  8:54 AM  SpO2 98 % 08/27/2018  8:54 AM  Vitals shown include unvalidated device data.  Last Pain:  Vitals:   08/27/18 0852  TempSrc: Tympanic  PainSc: 0-No pain         Complications: No apparent anesthesia complications

## 2018-08-27 NOTE — Anesthesia Preprocedure Evaluation (Signed)
Anesthesia Evaluation  Patient identified by MRN, date of birth, ID band Patient awake    Reviewed: Allergy & Precautions, H&P , NPO status , Patient's Chart, lab work & pertinent test results, reviewed documented beta blocker date and time   History of Anesthesia Complications (+) PONV and history of anesthetic complications  Airway Mallampati: I  TM Distance: >3 FB Neck ROM: full    Dental  (+) Caps, Dental Advidsory Given, Teeth Intact   Pulmonary neg pulmonary ROS,           Cardiovascular Exercise Tolerance: Good negative cardio ROS       Neuro/Psych PSYCHIATRIC DISORDERS Anxiety negative neurological ROS     GI/Hepatic Neg liver ROS, hiatal hernia, GERD  ,  Endo/Other  neg diabetesHypothyroidism   Renal/GU negative Renal ROS  negative genitourinary   Musculoskeletal   Abdominal   Peds  Hematology negative hematology ROS (+)   Anesthesia Other Findings Past Medical History: No date: Allergy No date: Anxiety 7/12: Breast cancer ([place])     Comment:  Right No date: Cellulitis No date: Diverticulosis of colon No date: GERD (gastroesophageal reflux disease) No date: Headache     Comment:  sinus No date: History of hiatal hernia 06/20/11 - 07/11/11: Hx of radiation therapy     Comment:  right breast No date: Hypothyroid No date: Osteoporosis 2012: Personal history of radiation therapy     Comment:  Right Breast Cancer No date: PONV (postoperative nausea and vomiting) No date: Skin cancer     Comment:  basal and squamous cell No date: Vertigo   Reproductive/Obstetrics negative OB ROS                             Anesthesia Physical Anesthesia Plan  ASA: II  Anesthesia Plan: General   Post-op Pain Management:    Induction: Intravenous  PONV Risk Score and Plan: 4 or greater and Propofol infusion and TIVA  Airway Management Planned: Natural Airway and Nasal  Cannula  Additional Equipment:   Intra-op Plan:   Post-operative Plan:   Informed Consent: I have reviewed the patients History and Physical, chart, labs and discussed the procedure including the risks, benefits and alternatives for the proposed anesthesia with the patient or authorized representative who has indicated his/her understanding and acceptance.   Dental Advisory Given  Plan Discussed with: Anesthesiologist, CRNA and Surgeon  Anesthesia Plan Comments:         Anesthesia Quick Evaluation

## 2018-08-27 NOTE — H&P (Signed)
Primary Care Physician:  Tower, Wynelle Fanny, MD Primary Gastroenterologist:  Dr. Vira Agar  Pre-Procedure History & Physical: HPI:  Gabrielle Webb is a 77 y.o. female is here for an colonoscopy.  This for [place] colon polyps.   Past Medical History:  Diagnosis Date  . Allergy   . Anxiety   . Breast cancer ([place]) 7/12   Right  . Cellulitis   . Diverticulosis of colon   . GERD (gastroesophageal reflux disease)   . Headache    sinus  . History of hiatal hernia   . Hx of radiation therapy 06/20/11 - 07/11/11   right breast  . Hypothyroid   . Osteoporosis   . Personal history of radiation therapy 2012   Right Breast Cancer  . PONV (postoperative nausea and vomiting)   . Skin cancer    basal and squamous cell  . Vertigo     Past Surgical History:  Procedure Laterality Date  . ABDOMINAL HYSTERECTOMY N/A 11/08/2016   Procedure: HYSTERECTOMY TOTAL  ABDOMINAL;  Surgeon: Dian Queen, MD;  Location: [facility];  Service: Gynecology;  Laterality: N/A;  . APPENDECTOMY  07/2007  . back sugery  1970  . BREAST BIOPSY Left    Benign  . BREAST EXCISIONAL BIOPSY Left 1983  . BREAST EXCISIONAL BIOPSY Left 1983  . BREAST LUMPECTOMY Right 05/16/2011  . COLONOSCOPY    . DILATION AND CURETTAGE OF UTERUS N/A 05/31/2014   Procedure: DILATATION AND CURETTAGE WITH ULTRASOUND GUIDANCE;  Surgeon: Cyril Mourning, MD;  Location: [facility];  Service: Gynecology;  Laterality: N/A;  . [place]  . right eye surgery    . SALPINGOOPHORECTOMY Bilateral 11/08/2016   Procedure: BILATERAL SALPINGO OOPHORECTOMY;  Surgeon: Dian Queen, MD;  Location: [facility];  Service: Gynecology;  Laterality: Bilateral;  . UPPER GI ENDOSCOPY      Prior to Admission medications   Medication Sig Start Date End Date Taking? Authorizing Provider  chlorthalidone (HYGROTON) 50 MG tablet Take 1 tablet (50 mg total) by mouth daily as needed. For swelling. 06/03/18  Yes Tower, Wynelle Fanny, MD  dicyclomine (BENTYL) 10 MG capsule Take 1  capsule (10 mg total) by mouth 2 (two) times daily as needed for spasms. 05/22/17  Yes Tower, Wynelle Fanny, MD  fluticasone (FLONASE) 50 MCG/ACT nasal spray TWO SPRAYS IN EACH NOSTRIL EVERY DAY 04/30/18  Yes Tower, Marne A, MD  Naproxen Sodium (ALEVE PO) Take 1 tablet by mouth as needed.   Yes [provider]  OVER THE COUNTER MEDICATION Take 1 tablet by mouth daily as needed (allergies). Over the counter allergy medication   Yes [provider]  ranitidine (ZANTAC) 150 MG tablet Take 150 mg by mouth as needed for heartburn.    Yes [provider]  alendronate (FOSAMAX) 70 MG tablet TAKE 1 TABLET EVERY 7 DAYS WITH A FULL GLASS OF WATER ON AN EMPTY STOMACH DO NOT LIE DOWN FOR AT LEAST 30 MIN 07/07/18   Tower, Wynelle Fanny, MD  aspirin 81 MG tablet Take 81 mg by mouth daily.     [provider]  atorvastatin (LIPITOR) 10 MG tablet Take 1 tablet (10 mg total) by mouth daily. 06/17/18   Tower, Wynelle Fanny, MD  BIOTIN PO Take by mouth daily.    [provider]  CALCIUM PO Take 1,500 mg by mouth daily.    [provider]  Cholecalciferol (VITAMIN D) 2000 UNITS CAPS Take 1 capsule by mouth daily.    [provider]  Cyanocobalamin (B-12 PO) Take 2,500 mcg by mouth daily.    [provider]  diclofenac sodium (VOLTAREN) 1 % GEL Apply 2 g topically 4 (four) times daily. Rub into affected area of foot 2 to 4 times daily Patient taking differently: Apply 2 g topically as needed. Rub into affected area of foot 2 to 4 times daily 12/21/14   Trula Slade, DPM  levothyroxine (SYNTHROID, LEVOTHROID) 100 MCG tablet TAKE ONE TABLET BY MOUTH EVERY DAY 07/07/18   Tower, Wynelle Fanny, MD  LUTEIN PO Take 6 mg by mouth daily.    [provider]  Multiple Vitamin (MULTIVITAMIN) capsule Take 1 capsule by mouth daily.      [provider]  niacin 500 MG tablet Take 1,000 mg by mouth at bedtime.     [provider]    Allergies as of 06/30/2018 -  Review Complete 06/03/2018  Allergen Reaction Noted  . Codeine Nausea And Vomiting 05/29/2011  . Fentanyl Nausea And Vomiting 11/30/2016  . Hydromorphone Nausea And Vomiting 11/30/2016  . Morphine Nausea And Vomiting 01/23/2008  . Tape  05/26/2018  . Prednisone Palpitations 12/21/2014    Family History  Problem Relation Age of Onset  . Heart attack Father 5  . Hypertension Father   . Atrial fibrillation Mother   . Coronary artery disease Mother   . Diabetes Unknown        Grandmother  . Coronary artery disease Unknown        Grandmother  . Uterine cancer Unknown        Grandmother  . Leukemia Unknown        Aunt  . Cancer Maternal Aunt        leukemia  . Cancer Maternal Uncle        colon    Social History   Socioeconomic History  . Marital status: Widowed    Spouse name: Not on file  . Number of children: 1  . Years of education: Not on file  . Highest education level: Not on file  Occupational History  . Occupation: Human resources officer  Social Needs  . Financial resource strain: Not on file  . Food insecurity:    Worry: Not on file    Inability: Not on file  . Transportation needs:    Medical: Not on file    Non-medical: Not on file  Tobacco Use  . Smoking status: Never Smoker  . Smokeless tobacco: Never Used  Substance and Sexual Activity  . Alcohol use: No    Alcohol/week: 0.0 standard drinks  . Drug use: No  . Sexual activity: Not Currently    Birth control/protection: Post-menopausal  Lifestyle  . Physical activity:    Days per week: Not on file    Minutes per session: Not on file  . Stress: Not on file  Relationships  . Social connections:    Talks on phone: Not on file    Gets together: Not on file    Attends religious service: Not on file    Active member of club or organization: Not on file    Attends meetings of clubs or organizations: Not on file    Relationship status: Not on file  . Intimate partner violence:    Fear of current or ex  partner: Not on file    Emotionally abused: Not on file    Physically abused: Not on file    Forced sexual activity: Not on file  Other Topics Concern  .  Not on file  Social History Narrative   Exercises on [employer] elliptical    Review of Systems: See HPI, otherwise negative ROS  Physical Exam: BP 126/70   Pulse 73   Temp (!) 97 F (36.1 C) (Tympanic)   Resp 16   Ht 5\' 3"  (1.6 m)   Wt 63 kg   LMP 12/03/1988   SpO2 100%   BMI 24.62 kg/m  General:   Alert,  pleasant and cooperative in NAD Head:  Normocephalic and atraumatic. Neck:  Supple; no masses or thyromegaly. Lungs:  Clear throughout to auscultation.    Heart:  Regular rate and rhythm. Abdomen:  Soft, nontender and nondistended. Normal bowel sounds, without guarding, and without rebound.   Neurologic:  Alert and  oriented x4;  grossly normal neurologically.  Impression/Plan: STEHANIE EKSTROM is here for an colonoscopy to be performed for [facility] colon polyps.  Risks, benefits, limitations, and alternatives regarding  colonoscopy have been reviewed with the patient.  Questions have been answered.  All parties agreeable.   Gaylyn Cheers, MD  08/27/2018, 8:13 AM

## 2018-08-27 NOTE — Anesthesia Procedure Notes (Signed)
Date/Time: 08/27/2018 8:18 AM Performed by: Johnna Acosta, CRNA Pre-anesthesia Checklist: Patient identified, Emergency Drugs available, Suction available, Patient being monitored and Timeout performed Patient Re-evaluated:Patient Re-evaluated prior to induction Oxygen Delivery Method: Nasal cannula Preoxygenation: Pre-oxygenation with 100% oxygen Induction Type: IV induction

## 2018-08-28 ENCOUNTER — Encounter: Payer: Self-pay | Admitting: Unknown Physician Specialty

## 2018-08-28 LAB — SURGICAL PATHOLOGY

## 2018-08-28 NOTE — Anesthesia Postprocedure Evaluation (Signed)
Anesthesia Post Note  Patient: JARISSA SHERIFF  Procedure(s) Performed: COLONOSCOPY WITH PROPOFOL (N/A )  Patient location during evaluation: Endoscopy Anesthesia Type: General Level of consciousness: awake and alert Pain management: pain level controlled Vital Signs Assessment: post-procedure vital signs reviewed and stable Respiratory status: spontaneous breathing, nonlabored ventilation, respiratory function stable and patient connected to nasal cannula oxygen Cardiovascular status: blood pressure returned to baseline and stable Postop Assessment: no apparent nausea or vomiting Anesthetic complications: no     Last Vitals:  Vitals:   08/27/18 0902 08/27/18 0922  BP: (!) 102/51 118/73  Pulse:    Resp:    Temp:    SpO2:  100%    Last Pain:  Vitals:   08/28/18 0800  TempSrc:   PainSc: 0-No pain                 Martha Clan

## 2018-09-02 ENCOUNTER — Telehealth: Payer: Self-pay | Admitting: *Deleted

## 2018-09-02 MED ORDER — FAMOTIDINE 20 MG PO TABS: 20.0000 mg | ORAL_TABLET | Freq: Every day | ORAL | 11 refills | Status: DC | PRN

## 2018-09-02 NOTE — Telephone Encounter (Signed)
I will send [facility]ep[facility]id to [facility] to replace zantac   If she thinks the statin is worsening gerd- she may have to try a week or two off and on it to compare and see what she thinks Let me know

## 2018-09-02 NOTE — Telephone Encounter (Signed)
Pt spoke to [place] about setting up Prolia and mentioned that she thinks that the atorvastatin is causing her GERD to flare up more but she doesn't know what to take because she is taking Zantac and there is a Conservation officer, nature recall of med. Please advise or send in another med for GERD and also advise pt if she should keep taking atorvastatin since she thinks it's causing worsening GERD

## 2018-09-02 NOTE — Telephone Encounter (Signed)
Verification of benefits have been processed and an approval has been received for pts prolia injection. Pts estimated cost are appx $50. This is only an estimate and cannot be confirmed until benefits are paid. Please advise pt and schedule if needed. If scheduled, once the injection is received, pls contact me back with the date it was received so that I am able to update prolia folder. thanks

## 2018-09-02 NOTE — Telephone Encounter (Signed)
Spoke to pt and advised. Pt is agreeable to out of pocket expense. Per Dr Glori Bickers, ok to proceed with 9/9 labs; repeat not required. Prolia injection scheduled.

## 2018-09-02 NOTE — Telephone Encounter (Signed)
Pt notified of Dr. Marliss Coots instructions and recommendations and verbalized understanding. She will keep [name] updated

## 2018-09-15 NOTE — Telephone Encounter (Signed)
Patient called and re-scheduled her proila injection from 10/22 to 11/14. Patient would like a call back if that is not okay.

## 2018-09-18 DIAGNOSIS — J039 Acute tonsillitis, unspecified: Secondary | ICD-10-CM | POA: Diagnosis not present

## 2018-09-23 ENCOUNTER — Ambulatory Visit: Payer: PPO

## 2018-09-30 ENCOUNTER — Other Ambulatory Visit: Payer: Self-pay | Admitting: Family Medicine

## 2018-09-30 NOTE — Telephone Encounter (Signed)
CPE was on 06/03/18 and next years CPE is already scheduled, last filled on 05/22/17 #30 caps with 2 refills, please advise

## 2018-10-15 DIAGNOSIS — D485 Neoplasm of uncertain behavior of skin: Secondary | ICD-10-CM | POA: Diagnosis not present

## 2018-10-15 DIAGNOSIS — Z85828 Personal history of other malignant neoplasm of skin: Secondary | ICD-10-CM | POA: Diagnosis not present

## 2018-10-15 DIAGNOSIS — C44712 Basal cell carcinoma of skin of right lower limb, including hip: Secondary | ICD-10-CM | POA: Diagnosis not present

## 2018-10-15 DIAGNOSIS — L570 Actinic keratosis: Secondary | ICD-10-CM | POA: Diagnosis not present

## 2018-10-15 DIAGNOSIS — X32XXXA Exposure to sunlight, initial encounter: Secondary | ICD-10-CM | POA: Diagnosis not present

## 2018-10-15 DIAGNOSIS — L821 Other seborrheic keratosis: Secondary | ICD-10-CM | POA: Diagnosis not present

## 2018-10-15 DIAGNOSIS — Z08 Encounter for follow-up examination after completed treatment for malignant neoplasm: Secondary | ICD-10-CM | POA: Diagnosis not present

## 2018-10-15 DIAGNOSIS — C44519 Basal cell carcinoma of skin of other part of trunk: Secondary | ICD-10-CM | POA: Diagnosis not present

## 2018-10-16 ENCOUNTER — Ambulatory Visit (INDEPENDENT_AMBULATORY_CARE_PROVIDER_SITE_OTHER): Payer: PPO

## 2018-10-16 DIAGNOSIS — M810 Age-related osteoporosis without current pathological fracture: Secondary | ICD-10-CM | POA: Diagnosis not present

## 2018-10-16 MED ORDER — DENOSUMAB 60 MG/ML ~~LOC~~ SOSY
60.0000 mg | PREFILLED_SYRINGE | Freq: Once | SUBCUTANEOUS | Status: AC
  Administered 2018-10-16: 60 mg via SUBCUTANEOUS

## 2018-10-23 ENCOUNTER — Telehealth: Payer: Self-pay | Admitting: Family Medicine

## 2018-10-23 NOTE — Telephone Encounter (Signed)
Chart updated

## 2018-10-23 NOTE — Telephone Encounter (Signed)
Pt called office stating she received her flu shot on 10-21-18 at [place].

## 2018-10-24 DIAGNOSIS — C44712 Basal cell carcinoma of skin of right lower limb, including hip: Secondary | ICD-10-CM | POA: Diagnosis not present

## 2018-10-24 DIAGNOSIS — C44519 Basal cell carcinoma of skin of other part of trunk: Secondary | ICD-10-CM | POA: Diagnosis not present

## 2018-11-07 ENCOUNTER — Other Ambulatory Visit: Payer: Self-pay | Admitting: Family Medicine

## 2018-11-11 DIAGNOSIS — H25813 Combined forms of age-related cataract, bilateral: Secondary | ICD-10-CM | POA: Diagnosis not present

## 2018-11-20 NOTE — Telephone Encounter (Signed)
Spoke to pt who states she received a bill for $231 and was unaware that she would be charge an [employer] of [phone] which was included. She states that she has spoken with [employer] and her pharmacy and they have advised the pt that she can have it administered at [place] for $180. I discussed with pt that this is a specialty medication and may still be delivered to [facility] through her insurance's mail order, although it is sent to [facility]. She does not believe that she afford to pay the $231 every 85mos. She is wanting to know if there are any alternates that may be available to her, and wanted information on reclast. pls advise

## 2018-11-20 NOTE — Telephone Encounter (Signed)
reclast is another bisphosphonate (like fosamax and boniva) which she has already taken (5 year course maximum is recommended) -it is just admin IV instead of PO Not an options since she has already taken that class   I do not blame her to stop prolia with that cost however

## 2018-11-20 NOTE — Telephone Encounter (Signed)
Patient advised.

## 2018-12-24 ENCOUNTER — Telehealth: Payer: Self-pay

## 2018-12-24 NOTE — Telephone Encounter (Signed)
On 12/23/18 pt started with head and chest congestion; non prod cough,fever on 12/23/18, did not take temp but felt warm. H/A, no ST or earache. Taking mucinex DM and OTC sinus med. Pt requesting Zpak. Pt cannot come to office for appt due to transportation.Please advise. [facility] and ask for med to be delivererd. Pt request cb.

## 2018-12-24 NOTE — Telephone Encounter (Signed)
I cannot px abx w/o an examination  Also since symptoms just started-it is likely viral instead of bacterial  Continue what you are doing and keep a check on temperature to make sure that is not flu  F/u if no imp

## 2018-12-24 NOTE — Telephone Encounter (Signed)
Pt notified of Dr. Tower's comments and verbalized understanding  

## 2018-12-29 DIAGNOSIS — H25812 Combined forms of age-related cataract, left eye: Secondary | ICD-10-CM | POA: Diagnosis not present

## 2018-12-29 DIAGNOSIS — Z01818 Encounter for other preprocedural examination: Secondary | ICD-10-CM | POA: Diagnosis not present

## 2019-01-13 DIAGNOSIS — H00024 Hordeolum internum left upper eyelid: Secondary | ICD-10-CM | POA: Diagnosis not present

## 2019-01-21 DIAGNOSIS — H2512 Age-related nuclear cataract, left eye: Secondary | ICD-10-CM | POA: Diagnosis not present

## 2019-01-21 DIAGNOSIS — H25812 Combined forms of age-related cataract, left eye: Secondary | ICD-10-CM | POA: Diagnosis not present

## 2019-02-04 DIAGNOSIS — H25811 Combined forms of age-related cataract, right eye: Secondary | ICD-10-CM | POA: Diagnosis not present

## 2019-02-04 DIAGNOSIS — H2511 Age-related nuclear cataract, right eye: Secondary | ICD-10-CM | POA: Diagnosis not present

## 2019-03-13 DIAGNOSIS — C44629 Squamous cell carcinoma of skin of left upper limb, including shoulder: Secondary | ICD-10-CM | POA: Diagnosis not present

## 2019-03-26 ENCOUNTER — Telehealth: Payer: Self-pay | Admitting: Family Medicine

## 2019-03-26 NOTE — Telephone Encounter (Signed)
In review for med refill, I note that patient's awv needs to be extended out due to billing coverage.  Awv needs to be 1 year and 1 day from last and appears this years is scheduled too early.  Will forward this to [facility] who manages Dr. Alba Cory schedule to contact patient and get appointment moved out a little farther.   Thanks.

## 2019-03-26 NOTE — Telephone Encounter (Signed)
Refill on Levothyroxine and atorvastatin  LAST OV 06/03/18 Annual exam Next OV: 06/03/19 Annual exam  Last refill: levothyroxine: #90, 3 refills on 07/07/18                 Atorvastatin #90, 3 refills on 06/17/18  Patient should have plenty refills left to carry her through her July appointment.  Too early for fill at the present.

## 2019-03-27 ENCOUNTER — Telehealth: Payer: Self-pay | Admitting: Family Medicine

## 2019-03-27 NOTE — Telephone Encounter (Signed)
Patient rescheduled her appointment to 06/16/19.  Patient said she's due for her Prolia on May 14,2020 or after. Please call patient about making appointment at [phone].

## 2019-03-27 NOTE — Telephone Encounter (Signed)
Opened in error

## 2019-03-30 NOTE — Telephone Encounter (Signed)
Will forward to [place] to initiate insurance benefits review and to move forward with scheduling patient for prolia as appropriate.   Please verify patient is up to date with calcium level lab draws as well.  Thanks.

## 2019-04-02 NOTE — Telephone Encounter (Signed)
Called to schedule patient for prolia injection. Patient states she can get it for $180 from [facility] but they are unable to administer it. She is wondering if it is possible and would it be cheaper to get it from [facility] and have someone in our office administer. Pt is requesting a call back.

## 2019-04-02 NOTE — Telephone Encounter (Signed)
Will forward to [facility] for further review and discussion.

## 2019-04-08 ENCOUNTER — Other Ambulatory Visit: Payer: Self-pay | Admitting: Family Medicine

## 2019-04-08 DIAGNOSIS — Z1231 Encounter for screening mammogram for malignant neoplasm of breast: Secondary | ICD-10-CM

## 2019-04-10 ENCOUNTER — Other Ambulatory Visit: Payer: Self-pay | Admitting: Family Medicine

## 2019-04-10 DIAGNOSIS — M810 Age-related osteoporosis without current pathological fracture: Secondary | ICD-10-CM

## 2019-04-10 NOTE — Telephone Encounter (Signed)
Pt to order Prolia from [place] and call us to schedule administration.  Benefits discussed

## 2019-04-16 NOTE — Telephone Encounter (Signed)
Best number [phone] [facility] rx sent to them

## 2019-04-17 MED ORDER — DENOSUMAB 60 MG/ML ~~LOC~~ SOSY: 60.0000 mg | PREFILLED_SYRINGE | Freq: Once | SUBCUTANEOUS | 0 refills | Status: AC

## 2019-04-17 NOTE — Addendum Note (Signed)
Addended by: Loura Pardon A on: 04/17/2019 12:28 PM   Modules accepted: Orders

## 2019-04-17 NOTE — Telephone Encounter (Signed)
Px sent

## 2019-04-17 NOTE — Telephone Encounter (Signed)
Patient will be picking up denosumab injection from [facility] and bringing to our office for administration.    Will pend denosumab rx order for Dr. Glori Bickers to approve if okay with moving forward with administration.   Patient's insurance coverage is better if she obtains medication through her local pharmacy versus [name] ordering through [place].

## 2019-04-17 NOTE — Addendum Note (Signed)
Addended by: Magdalen Spatz C on: 04/17/2019 12:18 PM   Modules accepted: Orders

## 2019-04-28 ENCOUNTER — Ambulatory Visit (INDEPENDENT_AMBULATORY_CARE_PROVIDER_SITE_OTHER): Payer: PPO

## 2019-04-28 DIAGNOSIS — M810 Age-related osteoporosis without current pathological fracture: Secondary | ICD-10-CM | POA: Diagnosis not present

## 2019-04-28 MED ORDER — DENOSUMAB 60 MG/ML ~~LOC~~ SOSY
60.0000 mg | PREFILLED_SYRINGE | Freq: Once | SUBCUTANEOUS | Status: AC
  Administered 2019-04-28: 60 mg via SUBCUTANEOUS

## 2019-04-28 NOTE — Progress Notes (Signed)
Pt received Prolia injection in Left arm today.

## 2019-05-28 DIAGNOSIS — H43811 Vitreous degeneration, right eye: Secondary | ICD-10-CM | POA: Diagnosis not present

## 2019-06-01 ENCOUNTER — Other Ambulatory Visit: Payer: PPO

## 2019-06-02 ENCOUNTER — Telehealth: Payer: Self-pay | Admitting: Family Medicine

## 2019-06-02 DIAGNOSIS — E7800 Pure hypercholesterolemia, unspecified: Secondary | ICD-10-CM

## 2019-06-02 DIAGNOSIS — Z0000 Encounter for general adult medical examination without abnormal findings: Secondary | ICD-10-CM

## 2019-06-02 DIAGNOSIS — E039 Hypothyroidism, unspecified: Secondary | ICD-10-CM

## 2019-06-02 DIAGNOSIS — M810 Age-related osteoporosis without current pathological fracture: Secondary | ICD-10-CM

## 2019-06-02 NOTE — Telephone Encounter (Signed)
-----   Message from Ellamae Sia sent at 05/28/2019  3:54 PM EDT ----- Regarding: Lab orders for Wednesday, 7.1.20 Patient is scheduled for CPX labs, please order future labs, Thanks , Karna Christmas

## 2019-06-03 ENCOUNTER — Other Ambulatory Visit (INDEPENDENT_AMBULATORY_CARE_PROVIDER_SITE_OTHER): Payer: PPO

## 2019-06-03 ENCOUNTER — Encounter: Payer: PPO | Admitting: Family Medicine

## 2019-06-03 ENCOUNTER — Ambulatory Visit: Payer: PPO

## 2019-06-03 ENCOUNTER — Other Ambulatory Visit: Payer: Self-pay

## 2019-06-03 DIAGNOSIS — Z0000 Encounter for general adult medical examination without abnormal findings: Secondary | ICD-10-CM

## 2019-06-03 DIAGNOSIS — E7800 Pure hypercholesterolemia, unspecified: Secondary | ICD-10-CM | POA: Diagnosis not present

## 2019-06-03 DIAGNOSIS — M810 Age-related osteoporosis without current pathological fracture: Secondary | ICD-10-CM | POA: Diagnosis not present

## 2019-06-03 DIAGNOSIS — E039 Hypothyroidism, unspecified: Secondary | ICD-10-CM

## 2019-06-03 LAB — COMPREHENSIVE METABOLIC PANEL
ALT: 11 U/L (ref 0–35)
AST: 18 U/L (ref 0–37)
Albumin: 4.5 g/dL (ref 3.5–5.2)
Alkaline Phosphatase: 50 U/L (ref 39–117)
BUN: 14 mg/dL (ref 6–23)
CO2: 30 mEq/L (ref 19–32)
Calcium: 9.4 mg/dL (ref 8.4–10.5)
Chloride: 104 mEq/L (ref 96–112)
Creatinine, Ser: 0.81 mg/dL (ref 0.40–1.20)
GFR: 68.74 mL/min (ref >60.00)
Glucose, Bld: 84 mg/dL (ref 70–99)
Potassium: 4 mEq/L (ref 3.5–5.1)
Sodium: 141 mEq/L (ref 135–145)
Total Bilirubin: 0.7 mg/dL (ref 0.2–1.2)
Total Protein: 6.9 g/dL (ref 6.0–8.3)

## 2019-06-03 LAB — CBC WITH DIFFERENTIAL/PLATELET
Basophils Absolute: 0 10*3/uL (ref 0.0–0.1)
Basophils Relative: 0.5 % (ref 0.0–3.0)
Eosinophils Absolute: 0.1 10*3/uL (ref 0.0–0.7)
Eosinophils Relative: 1.3 % (ref 0.0–5.0)
HCT: 40.9 % (ref 36.0–46.0)
Hemoglobin: 13.9 g/dL (ref 12.0–15.0)
Lymphocytes Relative: 37 % (ref 12.0–46.0)
Lymphs Abs: 2.6 10*3/uL (ref 0.7–4.0)
MCHC: 34 g/dL (ref 30.0–36.0)
MCV: 90.4 fl (ref 78.0–100.0)
Monocytes Absolute: 0.6 10*3/uL (ref 0.1–1.0)
Monocytes Relative: 8.6 % (ref 3.0–12.0)
Neutro Abs: 3.7 10*3/uL (ref 1.4–7.7)
Neutrophils Relative %: 52.6 % (ref 43.0–77.0)
Platelets: 186 10*3/uL (ref 150.0–400.0)
RBC: 4.52 Mil/uL (ref 3.87–5.11)
RDW: 13.8 % (ref 11.5–15.5)
WBC: 7.1 10*3/uL (ref 4.0–10.5)

## 2019-06-03 LAB — LIPID PANEL
Cholesterol: 169 mg/dL (ref 0–200)
HDL: 62.2 mg/dL (ref >39.00)
LDL Cholesterol: 87 mg/dL (ref 0–99)
NonHDL: 106.64
Total CHOL/HDL Ratio: 3
Triglycerides: 96 mg/dL (ref 0.0–149.0)
VLDL: 19.2 mg/dL (ref 0.0–40.0)

## 2019-06-03 LAB — TSH: TSH: 1.89 u[IU]/mL (ref 0.35–4.50)

## 2019-06-03 LAB — VITAMIN D 25 HYDROXY (VIT D DEFICIENCY, FRACTURES): VITD: 49.56 ng/mL (ref 30.00–100.00)

## 2019-06-08 ENCOUNTER — Other Ambulatory Visit: Payer: PPO

## 2019-06-12 DIAGNOSIS — L570 Actinic keratosis: Secondary | ICD-10-CM | POA: Diagnosis not present

## 2019-06-12 DIAGNOSIS — C44729 Squamous cell carcinoma of skin of left lower limb, including hip: Secondary | ICD-10-CM | POA: Diagnosis not present

## 2019-06-12 DIAGNOSIS — Z85828 Personal history of other malignant neoplasm of skin: Secondary | ICD-10-CM | POA: Diagnosis not present

## 2019-06-12 DIAGNOSIS — D485 Neoplasm of uncertain behavior of skin: Secondary | ICD-10-CM | POA: Diagnosis not present

## 2019-06-12 DIAGNOSIS — Z08 Encounter for follow-up examination after completed treatment for malignant neoplasm: Secondary | ICD-10-CM | POA: Diagnosis not present

## 2019-06-12 DIAGNOSIS — C44719 Basal cell carcinoma of skin of left lower limb, including hip: Secondary | ICD-10-CM | POA: Diagnosis not present

## 2019-06-16 ENCOUNTER — Other Ambulatory Visit: Payer: Self-pay

## 2019-06-16 ENCOUNTER — Ambulatory Visit (INDEPENDENT_AMBULATORY_CARE_PROVIDER_SITE_OTHER): Payer: PPO | Admitting: Family Medicine

## 2019-06-16 ENCOUNTER — Encounter: Payer: Self-pay | Admitting: Family Medicine

## 2019-06-16 VITALS — BP 110/68 | HR 70 | Temp 97.9°F | Ht 63.0 in | Wt 139.1 lb

## 2019-06-16 DIAGNOSIS — Z0000 Encounter for general adult medical examination without abnormal findings: Secondary | ICD-10-CM | POA: Diagnosis not present

## 2019-06-16 DIAGNOSIS — Z170 Estrogen receptor positive status [ER+]: Secondary | ICD-10-CM | POA: Diagnosis not present

## 2019-06-16 DIAGNOSIS — E7800 Pure hypercholesterolemia, unspecified: Secondary | ICD-10-CM

## 2019-06-16 DIAGNOSIS — Z853 Personal history of malignant neoplasm of breast: Secondary | ICD-10-CM | POA: Diagnosis not present

## 2019-06-16 DIAGNOSIS — M810 Age-related osteoporosis without current pathological fracture: Secondary | ICD-10-CM | POA: Diagnosis not present

## 2019-06-16 DIAGNOSIS — E039 Hypothyroidism, unspecified: Secondary | ICD-10-CM

## 2019-06-16 DIAGNOSIS — C50411 Malignant neoplasm of upper-outer quadrant of right female breast: Secondary | ICD-10-CM | POA: Diagnosis not present

## 2019-06-16 MED ORDER — ATORVASTATIN CALCIUM 10 MG PO TABS: 10.0000 mg | ORAL_TABLET | Freq: Every day | ORAL | 3 refills | Status: DC

## 2019-06-16 MED ORDER — FLUTICASONE PROPIONATE 50 MCG/ACT NA SUSP: NASAL | 5 refills | Status: DC

## 2019-06-16 MED ORDER — LEVOTHYROXINE SODIUM 100 MCG PO TABS: 100.0000 ug | ORAL_TABLET | Freq: Every day | ORAL | 3 refills | Status: DC

## 2019-06-16 NOTE — Assessment & Plan Note (Signed)
Hypothyroidism  Pt has no clinical changes No change in energy level/ hair or skin/ edema and no tremor Lab Results  Component Value Date   TSH 1.89 06/03/2019

## 2019-06-16 NOTE — Assessment & Plan Note (Signed)
Continues prolia Tolerating well  dexa 8/19  Past h/o tamoxifen Disc need for calcium/ vitamin D/ wt bearing exercise and bone density test every 2 y to monitor Disc safety/ fracture risk in detail

## 2019-06-16 NOTE — Progress Notes (Signed)
Subjective:    Patient ID: Gabrielle Webb, female    DOB: Aug 09, 1941, 78 y.o.   MRN: [id number]  HPI  I have personally reviewed the Medicare Annual Wellness questionnaire and have noted 1. The patient's medical and social history 2. Their use of alcohol, tobacco or illicit drugs 3. Their current medications and supplements 4. The patient's functional ability including ADL's, fall risks, home safety risks and hearing or visual             impairment. 5. Diet and physical activities 6. Evidence for depression or mood disorders  The patients weight, height, BMI have been recorded in the chart and visual acuity is per eye clinic.  I have made referrals, counseling and provided education to the patient based review of the above and I have provided the pt with a written personalized care plan for preventive services. Reviewed and updated provider list, see scanned forms.  See scanned forms.  Routine anticipatory guidance given to patient.  See health maintenance. Colon cancer screening -colonoscopy 9/19 -one tiny polyp Breast cancer screening mammogram 8/19 -has it scheduled next mo Self breast exam-no changes  Personal hx of breast cancer Took tamoxifen - and it caused endomet hyperplasia / she has had a hysterectomy Gyn care =sees Dr Helane Rima next week  Saw derm-several bx on legs -pend results  Flu vaccine- 11/19 Tetanus vaccine Tdap 1/19 Pneumovax= up to date on both Zoster vaccine-had both shingrix vaccines dexa -stable osteopenia 8/19  Taking prolia  Falls-none  Fractures -none  Supplements - taking D and Ca D level is 45  Advance directive- she has living will and POA- son is POA Cognitive function addressed- see scanned forms- and if abnormal then additional documentation follows.  No concerns - short term memory/misplacing/things/ nothing severe   PMH and SH reviewed  Meds, vitals, and allergies reviewed.   ROS: See HPI.  Otherwise negative.    Wt Readings from  Last 3 Encounters:  06/16/19 139 lb 2 oz (63.1 kg)  08/27/18 139 lb (63 kg)  08/11/18 139 lb 9.6 oz (63.3 kg)  staying active  Walking and doing aerobics 24.64 kg/m   Mother is in SNF-cannot visit her    Hearing Screening   125Hz 250Hz 500Hz 1000Hz 2000Hz 3000Hz 4000Hz 6000Hz 8000Hz  Right ear:   40 40 40  0    Left ear:   0 0 0  0    Vision Screening Comments: Pt had eye exam today with Dr. 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    BP Readings from Last 3 Encounters:  06/16/19 110/68  08/27/18 118/73  08/11/18 113/67   Pulse Readings from Last 3 Encounters:  06/16/19 70  08/27/18 60  08/11/18 73    Hypothyroidism  Pt has no clinical changes No change in energy level/ hair or skin/ edema and no tremor Lab Results  Component Value Date   TSH 1.89 06/03/2019      Hyperlipidemia Lab Results  Component Value Date   CHOL 169 06/03/2019   CHOL 165 07/28/2018   CHOL 224 (H) 05/26/2018   Lab Results  Component Value Date   HDL 62.20 06/03/2019   HDL 65.40 07/28/2018   HDL 62.00 05/26/2018   Lab Results  Component Value Date   LDLCALC 87 06/03/2019   [place] 82 07/28/2018   [place] 142 (  H) 05/26/2018   Lab Results  Component Value Date   TRIG 96.0 06/03/2019   TRIG 89.0 07/28/2018   TRIG 100.0 05/26/2018   Lab Results  Component Value Date   CHOLHDL 3 06/03/2019   CHOLHDL 3 07/28/2018   CHOLHDL 4 05/26/2018   Lab Results  Component Value Date   LDLDIRECT 142.0 03/19/2011   LDLDIRECT 125.4 12/08/2009   LDLDIRECT 172.0 09/19/2009   atorvastatin and diet  Also walking   Lab Results  Component Value Date   WBC 7.1 06/03/2019   HGB 13.9 06/03/2019   HCT 40.9 06/03/2019   MCV 90.4 06/03/2019   PLT 186.0 06/03/2019    Lab Results  Component Value Date   CREATININE 0.81 06/03/2019   BUN 14 06/03/2019   NA 141 06/03/2019    K 4.0 06/03/2019   CL 104 06/03/2019   CO2 30 06/03/2019   Lab Results  Component Value Date   ALT 11 06/03/2019   AST 18 06/03/2019   ALKPHOS 50 06/03/2019   BILITOT 0.7 06/03/2019      Patient Active Problem List   Diagnosis Date Noted  . Medicare annual wellness visit, subsequent 05/28/2018  . Estrogen deficiency 05/28/2018  . Fatigue 05/22/2017  . Chest discomfort 05/22/2017  . S/P TAH-BSO 11/08/2016  . Routine general medical examination at a health care facility 05/06/2016  . Malignant neoplasm of upper-outer quadrant of right breast in female, estrogen receptor positive ([place]) 03/21/2015  . History of tamoxifen therapy 05/18/2014  . Encounter for routine gynecological examination 05/06/2013  . Colon cancer screening 05/06/2013  . Encounter for Medicare annual wellness exam 04/28/2013  . Hx of radiation therapy   . Gynecological examination 12/25/2011  . Hx of Breast cancer, stage 1, Right, UOQ, Receptor+,Her2- 05/29/2011    Class: Stage 1  . Other screening mammogram 03/21/2011  . Post-menopausal 03/21/2011  . Hypothyroidism 01/23/2008  . Pure hypercholesterolemia 01/23/2008  . ALLERGIC RHINITIS 01/23/2008  . GERD 01/23/2008  . DIVERTICULOSIS, COLON 01/23/2008  . IBS 01/23/2008  . FIBROCYSTIC BREAST DISEASE 01/23/2008  . Osteoporosis 10/17/2007   Past Medical History:  Diagnosis Date  . Allergy   . Anxiety   . Breast cancer ([place]) 7/12   Right  . Cellulitis   . Diverticulosis of colon   . GERD (gastroesophageal reflux disease)   . Headache    sinus  . History of hiatal hernia   . Hx of radiation therapy 06/20/11 - 07/11/11   right breast  . Hypothyroid   . Osteoporosis   . Personal history of radiation therapy 2012   Right Breast Cancer  . PONV (postoperative nausea and vomiting)   . Skin cancer    basal and squamous cell  . Vertigo    Past Surgical History:  Procedure Laterality Date  . ABDOMINAL HYSTERECTOMY N/A 11/08/2016   Procedure:  HYSTERECTOMY TOTAL  ABDOMINAL;  Surgeon: Dian Queen, MD;  Location: [facility];  Service: Gynecology;  Laterality: N/A;  . APPENDECTOMY  07/2007  . back sugery  1970  . BREAST BIOPSY Left    Benign  . BREAST EXCISIONAL BIOPSY Left 1983  . BREAST EXCISIONAL BIOPSY Left 1983  . BREAST LUMPECTOMY Right 05/16/2011  . COLONOSCOPY    . COLONOSCOPY WITH PROPOFOL N/A 08/27/2018   Procedure: COLONOSCOPY WITH PROPOFOL;  Surgeon: Manya Silvas, MD;  Location: [facility];  Service: Endoscopy;  Laterality: N/A;  . DILATION AND CURETTAGE OF UTERUS N/A 05/31/2014   Procedure: DILATATION AND CURETTAGE WITH ULTRASOUND GUIDANCE;  Surgeon: Cyril Mourning, MD;  Location: [facility];  Service: Gynecology;  Laterality: N/A;  . [place]  . right eye surgery    . SALPINGOOPHORECTOMY Bilateral 11/08/2016   Procedure: BILATERAL SALPINGO OOPHORECTOMY;  Surgeon: Dian Queen, MD;  Location: [facility];  Service: Gynecology;  Laterality: Bilateral;  . UPPER GI ENDOSCOPY     Social History   Tobacco Use  . Smoking status: Never Smoker  . Smokeless tobacco: Never Used  Substance Use Topics  . Alcohol use: No    Alcohol/week: 0.0 standard drinks  . Drug use: No   Family History  Problem Relation Age of Onset  . Heart attack Father 53  . Hypertension Father   . Atrial fibrillation Mother   . Coronary artery disease Mother   . Diabetes Unknown        Grandmother  . Coronary artery disease Unknown        Grandmother  . Uterine cancer Unknown        Grandmother  . Leukemia Unknown        Aunt  . Cancer Maternal Aunt        leukemia  . Cancer Maternal Uncle        colon   Allergies  Allergen Reactions  . Codeine Nausea And Vomiting  . Fentanyl Nausea And Vomiting  . Hydromorphone Nausea And Vomiting       . Morphine Nausea And Vomiting  . Tape     Surgical tape  . Prednisone Palpitations    Rapid hear beat   Current Outpatient Medications on File Prior to Visit  Medication Sig  Dispense Refill  . alendronate (FOSAMAX) 70 MG tablet TAKE 1 TABLET EVERY 7 DAYS WITH A FULL GLASS OF WATER ON AN EMPTY STOMACH DO NOT LIE DOWN FOR AT LEAST 30 MIN 12 tablet 3  . aspirin 81 MG tablet Take 81 mg by mouth daily.     Marland Kitchen BIOTIN PO Take by mouth daily.    Marland Kitchen CALCIUM PO Take 1,500 mg by mouth daily.    . chlorthalidone (HYGROTON) 50 MG tablet TAKE 1 TABLET BY MOUTH DAILY AS NEEDED FOR SWELLING 90 tablet 2  . Cholecalciferol (VITAMIN D) 2000 UNITS CAPS Take 1 capsule by mouth daily.    . Cyanocobalamin (B-12 PO) Take 2,500 mcg by mouth daily.    . diclofenac sodium (VOLTAREN) 1 % GEL Apply 2 g topically 4 (four) times daily. Rub into affected area of foot 2 to 4 times daily (Patient taking differently: Apply 2 g topically as needed. Rub into affected area of foot 2 to 4 times daily) 100 g 2  . dicyclomine (BENTYL) 10 MG capsule TAKE 1 CAPSULE BY MOUTH TWICE DAILY AS NEEDED FO SPASMS 30 capsule 3  . famotidine (PEPCID) 20 MG tablet Take 1 tablet (20 mg total) by mouth daily as needed for heartburn or indigestion. 30 tablet 11  . LUTEIN PO Take 6 mg by mouth daily.    . Multiple Vitamin (MULTIVITAMIN) capsule Take 1 capsule by mouth daily.      . Naproxen Sodium (ALEVE PO) Take 1 tablet by mouth as needed.    . niacin 500 MG tablet Take 1,000 mg by mouth at bedtime.     Marland Kitchen OVER THE COUNTER MEDICATION Take 1 tablet by mouth daily as needed (allergies). Over the counter allergy medication     No current facility-administered medications on file prior to visit.      Review of Systems  Constitutional: Negative for activity change, appetite change, fatigue, fever and unexpected weight change.  HENT: Negative for congestion, ear pain, rhinorrhea, sinus pressure and sore throat.   Eyes: Negative for pain, redness and visual disturbance.  Respiratory: Negative for cough, shortness of breath and wheezing.   Cardiovascular: Negative for chest pain and palpitations.  Gastrointestinal: Negative  for abdominal pain, blood in stool, constipation and diarrhea.  Endocrine: Negative for polydipsia and polyuria.  Genitourinary: Negative for dysuria, frequency and urgency.  Musculoskeletal: Positive for arthralgias. Negative for back pain and myalgias.  Skin: Negative for pallor and rash.  Allergic/Immunologic: Negative for environmental allergies.  Neurological: Negative for dizziness, syncope and headaches.  Hematological: Negative for adenopathy. Does not bruise/bleed easily.  Psychiatric/Behavioral: Negative for decreased concentration and dysphoric mood. The patient is not nervous/anxious.        Stressors  Mother in nsg home       Objective:   Physical Exam Constitutional:      General: She is not in acute distress.    Appearance: Normal appearance. She is well-developed and normal weight. She is not ill-appearing or diaphoretic.  HENT:     Head: Normocephalic and atraumatic.     Right Ear: Tympanic membrane, ear canal and external ear normal.     Left Ear: Tympanic membrane, ear canal and external ear normal.     Nose: Nose normal.     Mouth/Throat:     Mouth: Mucous membranes are moist.     Pharynx: Oropharynx is clear. No posterior oropharyngeal erythema.  Eyes:     General: No scleral icterus.       Right eye: No discharge.        Left eye: No discharge.     Conjunctiva/sclera: Conjunctivae normal.     Pupils: Pupils are equal, round, and reactive to light.  Neck:     Musculoskeletal: Normal range of motion and neck supple. No neck rigidity or muscular tenderness.     Thyroid: No thyromegaly.     Vascular: No carotid bruit or JVD.  Cardiovascular:     Rate and Rhythm: Normal rate and regular rhythm.     Pulses: Normal pulses.     Heart sounds: Normal heart sounds. No gallop.   Pulmonary:     Effort: Pulmonary effort is normal. No respiratory distress.     Breath sounds: Normal breath sounds. No wheezing, rhonchi or rales.  Abdominal:     General: Bowel sounds  are normal. There is no distension.     Palpations: Abdomen is soft. There is no mass.     Tenderness: There is no abdominal tenderness.     Hernia: No hernia is present.  Genitourinary:    Comments: Sees gyn for this  Musculoskeletal:        General: No tenderness.     Right lower leg: No edema.     Left lower leg: No edema.  Lymphadenopathy:     Cervical: No cervical adenopathy.  Skin:    General: Skin is warm and dry.     Coloration: Skin is not pale.     Findings: No erythema or rash.     Comments: Solar lentigines diffusely   Neurological:     General: No focal deficit present.     Mental Status: She is alert.     Cranial Nerves: No cranial nerve deficit.     Motor: No abnormal muscle tone.     Coordination: Coordination normal.     Gait: Gait  normal.     Deep Tendon Reflexes: Reflexes are normal and symmetric. Reflexes normal.  Psychiatric:        Mood and Affect: Mood normal.        Cognition and Memory: Cognition and memory normal.           Assessment & Plan:   Problem List Items Addressed This Visit      Endocrine   Hypothyroidism    Hypothyroidism  Pt has no clinical changes No change in energy level/ hair or skin/ edema and no tremor Lab Results  Component Value Date   TSH 1.89 06/03/2019          Relevant Medications   levothyroxine (SYNTHROID) 100 MCG tablet     Musculoskeletal and Integument   Osteoporosis    Continues prolia Tolerating well  dexa 8/19  Past h/o tamoxifen Disc need for calcium/ vitamin D/ wt bearing exercise and bone density test every 2 y to monitor Disc safety/ fracture risk in detail          Other   Pure hypercholesterolemia    Disc goals for lipids and reasons to control them Rev last labs with pt Rev low sat fat diet in detail  Stable on atorvastatin and diet       Relevant Medications   atorvastatin (LIPITOR) 10 MG tablet   Hx of Breast cancer, stage 1, Right, UOQ, Receptor+,Her2-    Doing well  Past tx  with tamoxifen until endometrial side eff req TAH Mammogram planned  Continue oncol f/u        Malignant neoplasm of upper-outer quadrant of right breast in female, estrogen receptor positive ([place])    Doing well s/p tamoxifen treatment      Routine general medical examination at a health care facility    Reviewed health habits including diet and exercise and skin cancer prevention Reviewed appropriate screening tests for age  Also reviewed health mt list, fam hx and immunization status , as well as social and family history   See HPI Labs reviewed  Enc flu shot in the fall  Rev cognitive status as well as adv directive  Vision/hearing screens discussed  Enc good self care Continue prolia for OP  See gyn as planned for f/u Mammogram planned soon -also continue oncol f/u       Medicare annual wellness visit, subsequent - Primary    Reviewed health habits including diet and exercise and skin cancer prevention Reviewed appropriate screening tests for age  Also reviewed health mt list, fam hx and immunization status , as well as social and family history   See HPI Labs reviewed  Enc flu shot in the fall  Rev cognitive status as well as adv directive  Vision/hearing screens discussed  Enc good self care Continue prolia for OP  See gyn as planned for f/u Mammogram planned soon -also continue oncol f/u

## 2019-06-16 NOTE — Patient Instructions (Signed)
Keep up the good work with self care and diet and exercise  Labs look good   Keep walking Get a flu shot in the fall

## 2019-06-16 NOTE — Assessment & Plan Note (Signed)
Doing well s/p tamoxifen treatment

## 2019-06-16 NOTE — Assessment & Plan Note (Signed)
Reviewed health habits including diet and exercise and skin cancer prevention Reviewed appropriate screening tests for age  Also reviewed health mt list, fam hx and immunization status , as well as social and family history   See HPI Labs reviewed  Enc flu shot in the fall  Rev cognitive status as well as adv directive  Vision/hearing screens discussed  Enc good self care Continue prolia for OP  See gyn as planned for f/u Mammogram planned soon -also continue oncol f/u

## 2019-06-16 NOTE — Assessment & Plan Note (Signed)
Doing well  Past tx with tamoxifen until endometrial side eff req TAH Mammogram planned  Continue oncol f/u

## 2019-06-16 NOTE — Assessment & Plan Note (Signed)
Disc goals for lipids and reasons to control them Rev last labs with pt Rev low sat fat diet in detail  Stable on atorvastatin and diet

## 2019-06-19 DIAGNOSIS — Z124 Encounter for screening for malignant neoplasm of cervix: Secondary | ICD-10-CM | POA: Diagnosis not present

## 2019-06-19 DIAGNOSIS — Z853 Personal history of malignant neoplasm of breast: Secondary | ICD-10-CM | POA: Diagnosis not present

## 2019-06-19 DIAGNOSIS — Z23 Encounter for immunization: Secondary | ICD-10-CM | POA: Diagnosis not present

## 2019-06-19 DIAGNOSIS — K5900 Constipation, unspecified: Secondary | ICD-10-CM | POA: Diagnosis not present

## 2019-06-19 DIAGNOSIS — Z01419 Encounter for gynecological examination (general) (routine) without abnormal findings: Secondary | ICD-10-CM | POA: Diagnosis not present

## 2019-06-19 DIAGNOSIS — Z6824 Body mass index (BMI) 24.0-24.9, adult: Secondary | ICD-10-CM | POA: Diagnosis not present

## 2019-06-29 ENCOUNTER — Other Ambulatory Visit: Payer: Self-pay | Admitting: Family Medicine

## 2019-06-30 NOTE — Telephone Encounter (Signed)
CPE was on 06/16/19, last filled on 09/30/18 #30 caps with 3 refills, please advise

## 2019-07-08 DIAGNOSIS — C44719 Basal cell carcinoma of skin of left lower limb, including hip: Secondary | ICD-10-CM | POA: Diagnosis not present

## 2019-07-08 DIAGNOSIS — D485 Neoplasm of uncertain behavior of skin: Secondary | ICD-10-CM | POA: Diagnosis not present

## 2019-07-08 DIAGNOSIS — C44722 Squamous cell carcinoma of skin of right lower limb, including hip: Secondary | ICD-10-CM | POA: Diagnosis not present

## 2019-07-08 DIAGNOSIS — C44729 Squamous cell carcinoma of skin of left lower limb, including hip: Secondary | ICD-10-CM | POA: Diagnosis not present

## 2019-07-17 ENCOUNTER — Ambulatory Visit
Admission: RE | Admit: 2019-07-17 | Discharge: 2019-07-17 | Disposition: A | Discharge status: Home / Self Care | Payer: PPO | Source: Ambulatory Visit | Attending: Family Medicine | Admitting: Family Medicine

## 2019-07-17 ENCOUNTER — Other Ambulatory Visit: Payer: Self-pay

## 2019-07-17 DIAGNOSIS — Z1231 Encounter for screening mammogram for malignant neoplasm of breast: Secondary | ICD-10-CM | POA: Diagnosis not present

## 2019-07-23 DIAGNOSIS — H43811 Vitreous degeneration, right eye: Secondary | ICD-10-CM | POA: Diagnosis not present

## 2019-08-12 ENCOUNTER — Other Ambulatory Visit: Payer: Self-pay | Admitting: *Deleted

## 2019-08-12 DIAGNOSIS — C50411 Malignant neoplasm of upper-outer quadrant of right female breast: Secondary | ICD-10-CM
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
## 2019-08-13 ENCOUNTER — Inpatient Hospital Stay: Payer: PPO | Attending: Oncology | Admitting: Oncology

## 2019-08-13 ENCOUNTER — Other Ambulatory Visit: Payer: Self-pay

## 2019-08-13 ENCOUNTER — Inpatient Hospital Stay: Payer: PPO

## 2019-08-13 VITALS — BP 106/64 | HR 71 | Temp 98.3°F | Resp 18 | Ht 63.0 in | Wt 140.3 lb

## 2019-08-13 DIAGNOSIS — C50411 Malignant neoplasm of upper-outer quadrant of right female breast: Secondary | ICD-10-CM

## 2019-08-13 DIAGNOSIS — Z170 Estrogen receptor positive status [ER+]: Secondary | ICD-10-CM

## 2019-08-13 DIAGNOSIS — Z90722 Acquired absence of ovaries, bilateral: Secondary | ICD-10-CM | POA: Insufficient documentation

## 2019-08-13 DIAGNOSIS — Z923 Personal history of irradiation: Secondary | ICD-10-CM | POA: Diagnosis not present

## 2019-08-13 DIAGNOSIS — M810 Age-related osteoporosis without current pathological fracture: Secondary | ICD-10-CM | POA: Diagnosis not present

## 2019-08-13 DIAGNOSIS — Z90710 Acquired absence of both cervix and uterus: Secondary | ICD-10-CM | POA: Diagnosis not present

## 2019-08-13 DIAGNOSIS — Z806 Family history of leukemia: Secondary | ICD-10-CM | POA: Insufficient documentation

## 2019-08-13 DIAGNOSIS — Z853 Personal history of malignant neoplasm of breast: Secondary | ICD-10-CM | POA: Diagnosis not present

## 2019-08-13 DIAGNOSIS — M818 Other osteoporosis without current pathological fracture: Secondary | ICD-10-CM

## 2019-08-13 DIAGNOSIS — Z8049 Family history of malignant neoplasm of other genital organs: Secondary | ICD-10-CM | POA: Diagnosis not present

## 2019-08-13 LAB — CMP (CANCER CENTER ONLY)
ALT: 12 U/L (ref 0–44)
AST: 20 U/L (ref 15–41)
Albumin: 4.2 g/dL (ref 3.5–5.0)
Alkaline Phosphatase: 56 U/L (ref 38–126)
Anion gap: 8 (ref 5–15)
BUN: 13 mg/dL (ref 8–23)
CO2: 28 mmol/L (ref 22–32)
Calcium: 9.6 mg/dL (ref 8.9–10.3)
Chloride: 104 mmol/L (ref 98–111)
Creatinine: 0.81 mg/dL (ref 0.44–1.00)
GFR, Est AFR Am: >60 mL/min (ref >60)
GFR, Estimated: >60 mL/min (ref >60)
Glucose, Bld: 71 mg/dL (ref 70–99)
Potassium: 3.8 mmol/L (ref 3.5–5.1)
Sodium: 140 mmol/L (ref 135–145)
Total Bilirubin: 0.5 mg/dL (ref 0.3–1.2)
Total Protein: 7 g/dL (ref 6.5–8.1)

## 2019-08-13 LAB — CBC WITH DIFFERENTIAL (CANCER CENTER ONLY)
Abs Immature Granulocytes: 0.02 10*3/uL (ref 0.00–0.07)
Basophils Absolute: 0 10*3/uL (ref 0.0–0.1)
Basophils Relative: 0 %
Eosinophils Absolute: 0.2 10*3/uL (ref 0.0–0.5)
Eosinophils Relative: 2 %
HCT: 40.5 % (ref 36.0–46.0)
Hemoglobin: 13.4 g/dL (ref 12.0–15.0)
Immature Granulocytes: 0 %
Lymphocytes Relative: 39 %
Lymphs Abs: 2.8 10*3/uL (ref 0.7–4.0)
MCH: 29.7 pg (ref 26.0–34.0)
MCHC: 33.1 g/dL (ref 30.0–36.0)
MCV: 89.8 fL (ref 80.0–100.0)
Monocytes Absolute: 0.6 10*3/uL (ref 0.1–1.0)
Monocytes Relative: 9 %
Neutro Abs: 3.6 10*3/uL (ref 1.7–7.7)
Neutrophils Relative %: 50 %
Platelet Count: 176 10*3/uL (ref 150–400)
RBC: 4.51 MIL/uL (ref 3.87–5.11)
RDW: 12.7 % (ref 11.5–15.5)
WBC Count: 7.2 10*3/uL (ref 4.0–10.5)
nRBC: 0 % (ref 0.0–0.2)

## 2019-08-14 LAB — VITAMIN D 25 HYDROXY (VIT D DEFICIENCY, FRACTURES): Vit D, 25-Hydroxy: 43.3 ng/mL (ref 30.0–100.0)

## 2019-08-20 DIAGNOSIS — L820 Inflamed seborrheic keratosis: Secondary | ICD-10-CM | POA: Diagnosis not present

## 2019-08-20 DIAGNOSIS — L538 Other specified erythematous conditions: Secondary | ICD-10-CM | POA: Diagnosis not present

## 2019-09-28 ENCOUNTER — Telehealth: Payer: Self-pay

## 2019-09-28 ENCOUNTER — Other Ambulatory Visit: Payer: Self-pay | Admitting: Family Medicine

## 2019-09-28 NOTE — Telephone Encounter (Signed)
Discussed benefits w/pt.  Pt picks up her Prolia from [facility].  Administration fee is approximately $25.  She will check w/pharmacy to see if she has any refills, if not she will c/b.  She will also [email or web address]  about copay card.

## 2019-10-01 NOTE — Telephone Encounter (Signed)
Please clarify with her  Thanks

## 2019-10-01 NOTE — Telephone Encounter (Signed)
Last injection given was on 04/28/19 but she has been getting inj at office, we haven't sent in a Rx for med. Please advise

## 2019-10-02 NOTE — Telephone Encounter (Signed)
Pt picks Rx up from pharmacy and then we given it to her here. appt is already scheduled to get the injection here med refilled

## 2019-10-28 ENCOUNTER — Other Ambulatory Visit: Payer: Self-pay

## 2019-10-28 DIAGNOSIS — Z20822 Contact with and (suspected) exposure to covid-19: Secondary | ICD-10-CM

## 2019-10-29 LAB — NOVEL CORONAVIRUS, NAA: SARS-CoV-2, NAA: NOT DETECTED

## 2019-11-04 ENCOUNTER — Other Ambulatory Visit: Payer: Self-pay

## 2019-11-04 ENCOUNTER — Ambulatory Visit (INDEPENDENT_AMBULATORY_CARE_PROVIDER_SITE_OTHER): Payer: PPO

## 2019-11-04 DIAGNOSIS — M810 Age-related osteoporosis without current pathological fracture: Secondary | ICD-10-CM

## 2019-11-04 MED ORDER — DENOSUMAB 60 MG/ML ~~LOC~~ SOSY
60.0000 mg | PREFILLED_SYRINGE | Freq: Once | SUBCUTANEOUS | Status: AC
  Administered 2019-11-04: 15:00:00 60 mg via SUBCUTANEOUS

## 2019-11-10 NOTE — Progress Notes (Signed)
I reviewed health advisor's note, was available for consultation, and agree with documentation and plan. Marne Tower MD  

## 2019-11-12 ENCOUNTER — Other Ambulatory Visit: Payer: Self-pay | Admitting: Family Medicine

## 2019-12-08 ENCOUNTER — Telehealth: Payer: Self-pay

## 2019-12-08 NOTE — Telephone Encounter (Signed)
Patient returned Gabrielle Webb's call. Patient said she received the Prolia shot on 11/04/19. She wants to know if that will affect the covid vaccine.  Patient, also,  wants to know if Dr.Tower recommends Audiological scientist.

## 2019-12-08 NOTE — Telephone Encounter (Signed)
That sounds ok.   They will ask if she has a h/o anaphylaxis with anything in the past (I do not see any in her chart).  If not she should be good to get it

## 2019-12-08 NOTE — Telephone Encounter (Signed)
The prolia should not be a problem and I do not have a preference of vaccines

## 2019-12-08 NOTE — Telephone Encounter (Signed)
Left VM letting pt know Dr. Tower's comments  

## 2019-12-08 NOTE — Telephone Encounter (Signed)
Pt notified of Dr. Tower's comments  

## 2019-12-08 NOTE — Telephone Encounter (Signed)
Patient contacted the office and states she has an opportunity to receive the COVID vaccine, and she wants to make sure with her health history that Dr. Glori Bickers thinks this is okay? Please advise.  [phone]

## 2019-12-09 ENCOUNTER — Other Ambulatory Visit: Payer: Self-pay | Admitting: Family Medicine

## 2019-12-09 DIAGNOSIS — Z1231 Encounter for screening mammogram for malignant neoplasm of breast: Secondary | ICD-10-CM

## 2019-12-16 ENCOUNTER — Telehealth: Payer: Self-pay | Admitting: *Deleted

## 2019-12-16 NOTE — Telephone Encounter (Signed)
Patient left a voicemail stating that she may have a sinus infection. Patient started that she has a lot of pressure behind her eyes. Patient wants to know if it is okay for her to take Sudafed? Patient stated that her head does hurt. Patient wants to know what she can take OTC?  Patient stated that on the back of the sudafed there was a warning about taking it with thyroid disease. Patient stated that this has been going on for a couple of weeks. [facility]

## 2019-12-16 NOTE — Telephone Encounter (Signed)
I generally avoid pseudoephedrine (the sudafed you have to get at the pharmacy desk) -it can interfere with things and make you hyper/increase blood pressure   There is a sudafed PE (phenylephrine) on the isle that is ok   Nasal saline is ok also   F/u for virtual visit if no improvement

## 2019-12-16 NOTE — Telephone Encounter (Signed)
Pt notified of Dr. Marliss Coots comments. Virtual appt scheduled for tomorrow due to pt dealing with her sxs so long she thinks she has a sinus inf.

## 2019-12-17 ENCOUNTER — Encounter: Payer: Self-pay | Admitting: Family Medicine

## 2019-12-17 ENCOUNTER — Ambulatory Visit (INDEPENDENT_AMBULATORY_CARE_PROVIDER_SITE_OTHER): Payer: PPO | Admitting: Family Medicine

## 2019-12-17 ENCOUNTER — Other Ambulatory Visit: Payer: Self-pay

## 2019-12-17 DIAGNOSIS — J0100 Acute maxillary sinusitis, unspecified: Secondary | ICD-10-CM | POA: Diagnosis not present

## 2019-12-17 DIAGNOSIS — J019 Acute sinusitis, unspecified: Secondary | ICD-10-CM | POA: Insufficient documentation

## 2019-12-17 MED ORDER — AMOXICILLIN-POT CLAVULANATE 875-125 MG PO TABS: 1.0000 | ORAL_TABLET | Freq: Two times a day (BID) | ORAL | 0 refills | Status: DC

## 2019-12-17 NOTE — Progress Notes (Signed)
Virtual Visit via Video Note  I connected with [name] on 12/17/19 at 10:30 AM EST by a video enabled telemedicine application and verified that I am speaking with the correct person using two identifiers.  Location: Patient: home Provider: office    I discussed the limitations of evaluation and management by telemedicine and the availability of in person appointments. The patient expressed understanding and agreed to proceed.  Parties involved in encounter  Patient: Gabrielle Webb  Provider:  Loura Pardon MD    Video attempted and failed today  Proceeded by phone   History of Present Illness: Pt presents with sinus symptoms   Congestion before xmas Facial and eye pain -both sides  Lots of pressure behind eyes  Mucous is clear to yellow /green  Ears hurt and ring - R ear   Her face is tender to the touch  No swelling or redness   At one time she had mild ST- and it is better  No cough or chest congestion  No loss of taste or smell  No GI symptoms at all   She went and got tested for covid in dec and it was negative  Had been sick for a week or two before   She does have a cat she may be allergic to   Cold/allergy pills-(no pseudoephedrine) - ? Homeopathic  Heating pad for ear pain  flonase   Exposure : she goes to the grocery store and drug store  Goes to church every once in a while   Patient Active Problem List   Diagnosis Date Noted  . Acute sinusitis 12/17/2019  . Medicare annual wellness visit, subsequent 05/28/2018  . Estrogen deficiency 05/28/2018  . Fatigue 05/22/2017  . Chest discomfort 05/22/2017  . S/P TAH-BSO 11/08/2016  . Routine general medical examination at a health care facility 05/06/2016  . Malignant neoplasm of upper-outer quadrant of right breast in female, estrogen receptor positive ([place]) 03/21/2015  . History of tamoxifen therapy 05/18/2014  . Encounter for routine gynecological examination 05/06/2013  . Colon cancer  screening 05/06/2013  . Encounter for Medicare annual wellness exam 04/28/2013  . Hx of radiation therapy   . Gynecological examination 12/25/2011  . Hx of Breast cancer, stage 1, Right, UOQ, Receptor+,Her2- 05/29/2011    Class: Stage 1  . Other screening mammogram 03/21/2011  . Post-menopausal 03/21/2011  . Hypothyroidism 01/23/2008  . Pure hypercholesterolemia 01/23/2008  . ALLERGIC RHINITIS 01/23/2008  . GERD 01/23/2008  . DIVERTICULOSIS, COLON 01/23/2008  . IBS 01/23/2008  . FIBROCYSTIC BREAST DISEASE 01/23/2008  . Osteoporosis 10/17/2007   Past Medical History:  Diagnosis Date  . Allergy   . Anxiety   . Breast cancer ([place]) 7/12   Right  . Cellulitis   . Diverticulosis of colon   . GERD (gastroesophageal reflux disease)   . Headache    sinus  . History of hiatal hernia   . Hx of radiation therapy 06/20/11 - 07/11/11   right breast  . Hypothyroid   . Osteoporosis   . Personal history of radiation therapy 2012   Right Breast Cancer  . PONV (postoperative nausea and vomiting)   . Skin cancer    basal and squamous cell  . Vertigo    Past Surgical History:  Procedure Laterality Date  . ABDOMINAL HYSTERECTOMY N/A 11/08/2016   Procedure: HYSTERECTOMY TOTAL  ABDOMINAL;  Surgeon: Dian Queen, MD;  Location: [facility];  Service: Gynecology;  Laterality: N/A;  . APPENDECTOMY  07/2007  . back  sugery  1970  . BREAST BIOPSY Left    Benign  . BREAST EXCISIONAL BIOPSY Left 1983  . BREAST EXCISIONAL BIOPSY Left 1983  . BREAST LUMPECTOMY Right 05/16/2011  . COLONOSCOPY    . COLONOSCOPY WITH PROPOFOL N/A 08/27/2018   Procedure: COLONOSCOPY WITH PROPOFOL;  Surgeon: Manya Silvas, MD;  Location: [facility]COPY;  Service: Endoscopy;  Laterality: N/A;  . DILATION AND CURETTAGE OF UTERUS N/A 05/31/2014   Procedure: DILATATION AND CURETTAGE WITH ULTRASOUND GUIDANCE;  Surgeon: Cyril Mourning, MD;  Location: [facility];  Service: Gynecology;  Laterality: N/A;  . [place]  .  right eye surgery    . SALPINGOOPHORECTOMY Bilateral 11/08/2016   Procedure: BILATERAL SALPINGO OOPHORECTOMY;  Surgeon: Dian Queen, MD;  Location: [facility];  Service: Gynecology;  Laterality: Bilateral;  . UPPER GI ENDOSCOPY     Social History   Tobacco Use  . Smoking status: Never Smoker  . Smokeless tobacco: Never Used  Substance Use Topics  . Alcohol use: No    Alcohol/week: 0.0 standard drinks  . Drug use: No   Family History  Problem Relation Age of Onset  . Heart attack Father 67  . Hypertension Father   . Atrial fibrillation Mother   . Coronary artery disease Mother   . Diabetes Other        Grandmother  . Coronary artery disease Other        Grandmother  . Uterine cancer Other        Grandmother  . Leukemia Other        Aunt  . Cancer Maternal Aunt        leukemia  . Cancer Maternal Uncle        colon   Allergies  Allergen Reactions  . Codeine Nausea And Vomiting  . Fentanyl Nausea And Vomiting  . Hydromorphone Nausea And Vomiting       . Morphine Nausea And Vomiting  . Tape     Surgical tape  . Prednisone Palpitations    Rapid hear beat   Current Outpatient Medications on File Prior to Visit  Medication Sig Dispense Refill  . aspirin 81 MG tablet Take 81 mg by mouth daily.     Marland Kitchen atorvastatin (LIPITOR) 10 MG tablet Take 1 tablet (10 mg total) by mouth daily. 90 tablet 3  . BIOTIN PO Take by mouth daily.    Marland Kitchen CALCIUM PO Take 1,500 mg by mouth daily.    . chlorthalidone (HYGROTON) 50 MG tablet TAKE 1 TABLET BY MOUTH DAILY AS NEEDED FOR SWELLING 90 tablet 2  . Cholecalciferol (VITAMIN D) 2000 UNITS CAPS Take 1 capsule by mouth daily.    . Cyanocobalamin (B-12 PO) Take 2,500 mcg by mouth daily.    . diclofenac sodium (VOLTAREN) 1 % GEL Apply 2 g topically 4 (four) times daily. Rub into affected area of foot 2 to 4 times daily (Patient taking differently: Apply 2 g topically as needed. Rub into affected area of foot 2 to 4 times daily) 100 g 2  .  dicyclomine (BENTYL) 10 MG capsule TAKE 1 CAPSULE BY MOUTH TWICE DAILY FOR SPASMS 30 capsule 5  . famotidine (PEPCID) 20 MG tablet TAKE 1 TABLET BY MOUTH DAILY AS NEEDED FOR HEARTBURN OR INDIGESTION 90 tablet 1  . fluticasone (FLONASE) 50 MCG/ACT nasal spray TWO SPRAYS IN EACH NOSTRIL EVERY DAY 18 g 5  . levothyroxine (SYNTHROID) 100 MCG tablet Take 1 tablet (100 mcg total) by mouth daily. [place]  tablet 3  . LUTEIN PO Take 6 mg by mouth daily.    . Multiple Vitamin (MULTIVITAMIN) capsule Take 1 capsule by mouth daily.      . Naproxen Sodium (ALEVE PO) Take 1 tablet by mouth as needed.    . niacin 500 MG tablet Take 1,000 mg by mouth at bedtime.     Marland Kitchen OVER THE COUNTER MEDICATION Take 1 tablet by mouth daily as needed (allergies). Over the counter allergy medication    . PROLIA 60 MG/ML SOSY injection INJECT 60 MG INTO THE SKIN ONCE FOR 1 DOSE 1 mL 0   No current facility-administered medications on file prior to visit.   Review of Systems  Constitutional: Positive for malaise/fatigue. Negative for chills and fever.  HENT: Positive for congestion, ear pain, sinus pain and tinnitus. Negative for ear discharge and sore throat.   Eyes: Negative for blurred vision, discharge and redness.  Respiratory: Negative for cough, sputum production, shortness of breath, wheezing and stridor.   Cardiovascular: Negative for chest pain, palpitations and leg swelling.  Gastrointestinal: Negative for abdominal pain, diarrhea, nausea and vomiting.  Musculoskeletal: Negative for myalgias.  Skin: Negative for rash.  Neurological: Positive for headaches. Negative for dizziness.      Observations/Objective: Pt sounds well/not distressed Slightly hoarse and congested sounding No cough or sob during the interview  Pt reports pain to touch cheeks under eyes but no facial swelling or redness  Affect/mood are normal  Nl cognition, good historian   Assessment and Plan: Problem List Items Addressed This Visit       Respiratory   Acute sinusitis    With R ear pain  S/p uri  Has had neg covid test  Px augmentin to take as directed  Nasal saline / stream  Continue flonase  inst to call if worse / also if new symptoms or not starting to improve within a week  She will continue to stay in and away from people as well          Follow Up Instructions: Take the augmentin as directed for a sinus infection (if you have an ear infection this will also help) Drink fluids and rest Continue flonase  Steam and nasal saline may also help  Update if not starting to improve in a week or if worsening   Update if any new symptoms as well   Take care    I discussed the assessment and treatment plan with the patient. The patient was provided an opportunity to ask questions and all were answered. The patient agreed with the plan and demonstrated an understanding of the instructions.   The patient was advised to call back or seek an in-person evaluation if the symptoms worsen or if the condition fails to improve as anticipated.  I provided 20 minutes of non-face-to-face time during this encounter.   Loura Pardon, MD

## 2019-12-17 NOTE — Patient Instructions (Signed)
Take the augmentin as directed for a sinus infection (if you have an ear infection this will also help) Drink fluids and rest Continue flonase  Steam and nasal saline may also help  Update if not starting to improve in a week or if worsening   Update if any new symptoms as well   Take care

## 2019-12-17 NOTE — Assessment & Plan Note (Addendum)
With R ear pain  S/p uri  Has had neg covid test  Px augmentin to take as directed  Nasal saline / stream  Continue flonase  inst to call if worse / also if new symptoms or not starting to improve within a week  She will continue to stay in and away from people as well

## 2019-12-21 ENCOUNTER — Ambulatory Visit: Payer: PPO | Attending: Internal Medicine

## 2019-12-21 DIAGNOSIS — Z20822 Contact with and (suspected) exposure to covid-19: Secondary | ICD-10-CM

## 2019-12-23 DIAGNOSIS — Z85828 Personal history of other malignant neoplasm of skin: Secondary | ICD-10-CM | POA: Diagnosis not present

## 2019-12-23 DIAGNOSIS — D2261 Melanocytic nevi of right upper limb, including shoulder: Secondary | ICD-10-CM | POA: Diagnosis not present

## 2019-12-23 DIAGNOSIS — L570 Actinic keratosis: Secondary | ICD-10-CM | POA: Diagnosis not present

## 2019-12-23 DIAGNOSIS — X32XXXA Exposure to sunlight, initial encounter: Secondary | ICD-10-CM | POA: Diagnosis not present

## 2019-12-23 DIAGNOSIS — D2262 Melanocytic nevi of left upper limb, including shoulder: Secondary | ICD-10-CM | POA: Diagnosis not present

## 2019-12-23 DIAGNOSIS — D225 Melanocytic nevi of trunk: Secondary | ICD-10-CM | POA: Diagnosis not present

## 2019-12-23 DIAGNOSIS — Z872 Personal history of diseases of the skin and subcutaneous tissue: Secondary | ICD-10-CM | POA: Diagnosis not present

## 2019-12-23 DIAGNOSIS — L821 Other seborrheic keratosis: Secondary | ICD-10-CM | POA: Diagnosis not present

## 2019-12-23 LAB — NOVEL CORONAVIRUS, NAA: SARS-CoV-2, NAA: NOT DETECTED

## 2020-01-06 DIAGNOSIS — J019 Acute sinusitis, unspecified: Secondary | ICD-10-CM | POA: Diagnosis not present

## 2020-01-06 DIAGNOSIS — H6982 Other specified disorders of Eustachian tube, left ear: Secondary | ICD-10-CM | POA: Diagnosis not present

## 2020-01-06 DIAGNOSIS — H90A22 Sensorineural hearing loss, unilateral, left ear, with restricted hearing on the contralateral side: Secondary | ICD-10-CM | POA: Diagnosis not present

## 2020-02-02 DIAGNOSIS — J019 Acute sinusitis, unspecified: Secondary | ICD-10-CM | POA: Diagnosis not present

## 2020-03-25 ENCOUNTER — Other Ambulatory Visit: Payer: Self-pay | Admitting: Family Medicine

## 2020-04-14 ENCOUNTER — Telehealth: Payer: Self-pay

## 2020-04-14 DIAGNOSIS — M810 Age-related osteoporosis without current pathological fracture: Secondary | ICD-10-CM

## 2020-04-14 NOTE — Telephone Encounter (Signed)
Last Prolia injection 11-05-2019.  Pt has HTA, no PA rqd.  Pt would owe approx.$260 ($240 for Prolia - 20% and $20 for admin fee - 20%).

## 2020-04-15 NOTE — Telephone Encounter (Signed)
Pt brought Rx Prolia from [place] last injection.

## 2020-04-18 ENCOUNTER — Telehealth: Payer: Self-pay | Admitting: Family Medicine

## 2020-04-18 ENCOUNTER — Other Ambulatory Visit: Payer: Self-pay

## 2020-04-18 DIAGNOSIS — M810 Age-related osteoporosis without current pathological fracture: Secondary | ICD-10-CM

## 2020-04-18 DIAGNOSIS — M818 Other osteoporosis without current pathological fracture: Secondary | ICD-10-CM

## 2020-04-18 NOTE — Telephone Encounter (Signed)
Added order for BMP

## 2020-04-18 NOTE — Telephone Encounter (Signed)
Scheduled pt for lab tomorrow and prolia injection on 05/10/20. Pt will need script sent in to [facility] pharmacy after labs come back. Pt requested this nurse call with lab results. Advised if anything else is needed to contact the office. Pt verbalized understanding.

## 2020-04-18 NOTE — Telephone Encounter (Signed)
-----   Message from Ellamae Sia sent at 04/18/2020  2:25 PM EDT ----- Regarding: lab orders for Tuesday, 5.18.21 Lab for prolia

## 2020-04-18 NOTE — Telephone Encounter (Signed)
LVM. Please send call to this nurse.

## 2020-04-19 ENCOUNTER — Other Ambulatory Visit (INDEPENDENT_AMBULATORY_CARE_PROVIDER_SITE_OTHER): Payer: PPO

## 2020-04-19 ENCOUNTER — Other Ambulatory Visit: Payer: Self-pay

## 2020-04-19 DIAGNOSIS — M818 Other osteoporosis without current pathological fracture: Secondary | ICD-10-CM | POA: Diagnosis not present

## 2020-04-19 LAB — BASIC METABOLIC PANEL
BUN: 16 mg/dL (ref 6–23)
CO2: 31 mEq/L (ref 19–32)
Calcium: 9.2 mg/dL (ref 8.4–10.5)
Chloride: 105 mEq/L (ref 96–112)
Creatinine, Ser: 0.74 mg/dL (ref 0.40–1.20)
GFR: 76.12 mL/min (ref >60.00)
Glucose, Bld: 90 mg/dL (ref 70–99)
Potassium: 3.9 mEq/L (ref 3.5–5.1)
Sodium: 140 mEq/L (ref 135–145)

## 2020-04-20 MED ORDER — PROLIA 60 MG/ML ~~LOC~~ SOSY: 60.0000 mg | PREFILLED_SYRINGE | SUBCUTANEOUS | 0 refills | Status: DC

## 2020-04-20 NOTE — Telephone Encounter (Signed)
Advised pt of results and sent in script. Pt verbalized understanding.

## 2020-04-20 NOTE — Addendum Note (Signed)
Addended by: Randall An on: 04/20/2020 09:42 AM   Modules accepted: Orders

## 2020-04-21 DIAGNOSIS — X32XXXA Exposure to sunlight, initial encounter: Secondary | ICD-10-CM | POA: Diagnosis not present

## 2020-04-21 DIAGNOSIS — D485 Neoplasm of uncertain behavior of skin: Secondary | ICD-10-CM | POA: Diagnosis not present

## 2020-04-21 DIAGNOSIS — D0462 Carcinoma in situ of skin of left upper limb, including shoulder: Secondary | ICD-10-CM | POA: Diagnosis not present

## 2020-04-21 DIAGNOSIS — D2262 Melanocytic nevi of left upper limb, including shoulder: Secondary | ICD-10-CM | POA: Diagnosis not present

## 2020-04-21 DIAGNOSIS — L720 Epidermal cyst: Secondary | ICD-10-CM | POA: Diagnosis not present

## 2020-04-21 DIAGNOSIS — Z85828 Personal history of other malignant neoplasm of skin: Secondary | ICD-10-CM | POA: Diagnosis not present

## 2020-04-21 DIAGNOSIS — L821 Other seborrheic keratosis: Secondary | ICD-10-CM | POA: Diagnosis not present

## 2020-04-21 DIAGNOSIS — D2271 Melanocytic nevi of right lower limb, including hip: Secondary | ICD-10-CM | POA: Diagnosis not present

## 2020-04-21 DIAGNOSIS — D2261 Melanocytic nevi of right upper limb, including shoulder: Secondary | ICD-10-CM | POA: Diagnosis not present

## 2020-04-21 DIAGNOSIS — L570 Actinic keratosis: Secondary | ICD-10-CM | POA: Diagnosis not present

## 2020-05-03 ENCOUNTER — Encounter: Payer: Self-pay | Admitting: Family Medicine

## 2020-05-03 ENCOUNTER — Telehealth: Payer: Self-pay

## 2020-05-03 ENCOUNTER — Other Ambulatory Visit: Payer: Self-pay | Admitting: Family Medicine

## 2020-05-03 ENCOUNTER — Ambulatory Visit (INDEPENDENT_AMBULATORY_CARE_PROVIDER_SITE_OTHER): Payer: PPO | Admitting: Family Medicine

## 2020-05-03 ENCOUNTER — Other Ambulatory Visit: Payer: Self-pay

## 2020-05-03 VITALS — BP 118/70 | HR 83 | Temp 97.8°F | Ht 63.0 in | Wt 142.2 lb

## 2020-05-03 DIAGNOSIS — N6313 Unspecified lump in the right breast, lower outer quadrant: Secondary | ICD-10-CM | POA: Diagnosis not present

## 2020-05-03 DIAGNOSIS — M810 Age-related osteoporosis without current pathological fracture: Secondary | ICD-10-CM

## 2020-05-03 DIAGNOSIS — Z853 Personal history of malignant neoplasm of breast: Secondary | ICD-10-CM

## 2020-05-03 NOTE — Patient Instructions (Addendum)
I placed imaging order and you will get a call to set that up   If you develop anything new in the meantime (like skin change or pain)  Take care of yourself

## 2020-05-03 NOTE — Telephone Encounter (Signed)
I spoke with pt; pt found lump size of a bee bee from a bee bee gun at an incision on rt breast; the lump is stationary and does not move. Pt would like to see Dr Glori Bickers. Pt scheduled appt today with Dr Glori Bickers at 10:30 and 11:15 appt blocked to allow time for visit. Pt voiced understanding. Pt has no covid symptoms, no travel and no known exposure to + covid.

## 2020-05-03 NOTE — Assessment & Plan Note (Signed)
Superficial lump palp under lumpectomy scar in R breast  Suspect this will be scar tissue or fibrocystic change  Ref for diag mm and [name]  Pending results

## 2020-05-03 NOTE — Progress Notes (Signed)
Subjective:    Patient ID: Gabrielle Webb, female    DOB: [birth year], 79 y.o.   MRN: [id number]  This visit occurred during the SARS-CoV-2 public health emergency.  Safety protocols were in place, including screening questions prior to the visit, additional usage of staff PPE, and extensive cleaning of exam room while observing appropriate contact time as indicated for disinfecting solutions.    HPI Pt presents for lump in R breast w/in one of her scars   Wt Readings from Last 3 Encounters:  05/03/20 142 lb 4 oz (64.5 kg)  08/13/19 140 lb 4.8 oz (63.6 kg)  06/16/19 139 lb 2 oz (63.1 kg)   25.20 kg/m   Noticed the lump (size of a BB) in her R breast near/under scar  No skin color change  No drainage or pain or nipple d/c   She is R sided  Uses R arm a lot     Has a skin cancer on L arm - getting it taken care of      Pt has h/o breast cancer- upper/outer quad of R breast that was est rec pos In 2012 tx with lumpectomy and sentinel node eval and then radiation and tamoxifen (ended 5 y of that in 2015) She also has a h/o fibrocystic breast dz    Last mammogram 8/20 MM 3D SCREEN BREAST BILATERAL (Accession [id number]) (Order [id number]) Imaging Date: 07/17/2019 Department: [facility] Released By: De Blanch Authorizing: Nitin Mckowen, Wynelle Fanny, MD  Exam Status  Status  Final [99]  Study Result  CLINICAL DATA:  Screening.  EXAM: DIGITAL SCREENING BILATERAL MAMMOGRAM WITH TOMO AND CAD  COMPARISON:  Previous exam(s).  ACR Breast Density Category c: The breast tissue is heterogeneously dense, which may obscure small masses.  FINDINGS: There are no findings suspicious for malignancy. Images were processed with CAD.  IMPRESSION: No mammographic evidence of malignancy. A result letter of this screening mammogram will be mailed directly to the patient.  RECOMMENDATION: Screening mammogram in one year. (Code:SM-B-01Y)  BI-RADS  CATEGORY  1: Negative.   Electronically Signed   By: Lajean Manes M.D.   On: 07/17/2019 16:27    Patient Active Problem List   Diagnosis Date Noted  . Breast lump on right side at 8 o'clock position 05/03/2020  . History of breast cancer 05/03/2020  . Acute sinusitis 12/17/2019  . Medicare annual wellness visit, subsequent 05/28/2018  . Estrogen deficiency 05/28/2018  . Fatigue 05/22/2017  . Chest discomfort 05/22/2017  . S/P TAH-BSO 11/08/2016  . Routine general medical examination at a health care facility 05/06/2016  . Malignant neoplasm of upper-outer quadrant of right breast in female, estrogen receptor positive ([place]) 03/21/2015  . History of tamoxifen therapy 05/18/2014  . Encounter for routine gynecological examination 05/06/2013  . Colon cancer screening 05/06/2013  . Encounter for Medicare annual wellness exam 04/28/2013  . Hx of radiation therapy   . Gynecological examination 12/25/2011  . Hx of Breast cancer, stage 1, Right, UOQ, Receptor+,Her2- 05/29/2011    Class: Stage 1  . Other screening mammogram 03/21/2011  . Post-menopausal 03/21/2011  . Hypothyroidism 01/23/2008  . Pure hypercholesterolemia 01/23/2008  . ALLERGIC RHINITIS 01/23/2008  . GERD 01/23/2008  . DIVERTICULOSIS, COLON 01/23/2008  . IBS 01/23/2008  . FIBROCYSTIC BREAST DISEASE 01/23/2008  . Osteoporosis 10/17/2007   Past Medical History:  Diagnosis Date  . Allergy   . Anxiety   . Breast cancer ([place]) 7/12   Right  . Cellulitis   .  Diverticulosis of colon   . GERD (gastroesophageal reflux disease)   . Headache    sinus  . History of hiatal hernia   . Hx of radiation therapy 06/20/11 - 07/11/11   right breast  . Hypothyroid   . Osteoporosis   . Personal history of radiation therapy 2012   Right Breast Cancer  . PONV (postoperative nausea and vomiting)   . Skin cancer    basal and squamous cell  . Vertigo    Past Surgical History:  Procedure Laterality Date  . ABDOMINAL  HYSTERECTOMY N/A 11/08/2016   Procedure: HYSTERECTOMY TOTAL  ABDOMINAL;  Surgeon: Dian Queen, MD;  Location: [facility];  Service: Gynecology;  Laterality: N/A;  . APPENDECTOMY  07/2007  . back sugery  1970  . BREAST BIOPSY Left    Benign  . BREAST EXCISIONAL BIOPSY Left 1983  . BREAST EXCISIONAL BIOPSY Left 1983  . BREAST LUMPECTOMY Right 05/16/2011  . COLONOSCOPY    . COLONOSCOPY WITH PROPOFOL N/A 08/27/2018   Procedure: COLONOSCOPY WITH PROPOFOL;  Surgeon: Manya Silvas, MD;  Location: [facility] END[facility]COPY;  Service: Endoscopy;  Laterality: N/A;  . DILATION AND CURETTAGE OF UTERUS N/A 05/31/2014   Procedure: DILATATION AND CURETTAGE WITH ULTRASOUND GUIDANCE;  Surgeon: Cyril Mourning, MD;  Location: [facility];  Service: Gynecology;  Laterality: N/A;  . [place]  . right eye surgery    . SALPINGOOPHORECTOMY Bilateral 11/08/2016   Procedure: BILATERAL SALPINGO OOPHORECTOMY;  Surgeon: Dian Queen, MD;  Location: [facility];  Service: Gynecology;  Laterality: Bilateral;  . UPPER GI ENDOSCOPY     Social History   Tobacco Use  . Smoking status: Never Smoker  . Smokeless tobacco: Never Used  Substance Use Topics  . Alcohol use: No    Alcohol/week: 0.0 standard drinks  . Drug use: No   Family History  Problem Relation Age of Onset  . Heart attack Father 93  . Hypertension Father   . Atrial fibrillation Mother   . Coronary artery disease Mother   . Diabetes Other        Grandmother  . Coronary artery disease Other        Grandmother  . Uterine cancer Other        Grandmother  . Leukemia Other        Aunt  . Cancer Maternal Aunt        leukemia  . Cancer Maternal Uncle        colon   Allergies  Allergen Reactions  . Codeine Nausea And Vomiting  . Fentanyl Nausea And Vomiting  . Hydromorphone Nausea And Vomiting       . Morphine Nausea And Vomiting  . Tape     Surgical tape  . Prednisone Palpitations    Rapid hear beat   Current Outpatient Medications on File  Prior to Visit  Medication Sig Dispense Refill  . aspirin 81 MG tablet Take 81 mg by mouth daily.     Marland Kitchen atorvastatin (LIPITOR) 10 MG tablet TAKE ONE TABLET BY MOUTH EVERY DAY 90 tablet 3  . BIOTIN PO Take by mouth daily.    Marland Kitchen CALCIUM PO Take 1,500 mg by mouth daily.    . chlorthalidone (HYGROTON) 50 MG tablet TAKE 1 TABLET BY MOUTH DAILY AS NEEDED FOR SWELLING 90 tablet 2  . Cholecalciferol (VITAMIN D) 2000 UNITS CAPS Take 1 capsule by mouth daily.    . Cyanocobalamin (B-12 PO) Take 2,500 mcg by mouth daily.    Marland Kitchen  denosumab (PROLIA) 60 MG/ML SOSY injection Inject 60 mg into the skin every 6 (six) months. 1 mL 0  . diclofenac sodium (VOLTAREN) 1 % GEL Apply 2 g topically 4 (four) times daily. Rub into affected area of foot 2 to 4 times daily (Patient taking differently: Apply 2 g topically as needed. Rub into affected area of foot 2 to 4 times daily) 100 g 2  . dicyclomine (BENTYL) 10 MG capsule TAKE 1 CAPSULE BY MOUTH TWICE DAILY FOR SPASMS 30 capsule 5  . famotidine (PEPCID) 20 MG tablet TAKE ONE TABLET EVERY DAY AS NEEDED FOR HEARTBURN OR INDIGESTION 90 tablet 1  . fluticasone (FLONASE) 50 MCG/ACT nasal spray TWO SPRAYS IN EACH NOSTRIL EVERY DAY 18 g 5  . levothyroxine (SYNTHROID) 100 MCG tablet TAKE ONE TABLET EVERY DAY 90 tablet 3  . LUTEIN PO Take 6 mg by mouth daily.    . Multiple Vitamin (MULTIVITAMIN) capsule Take 1 capsule by mouth daily.      . Naproxen Sodium (ALEVE PO) Take 1 tablet by mouth as needed.    . niacin 500 MG tablet Take 1,000 mg by mouth at bedtime.     Marland Kitchen OVER THE COUNTER MEDICATION Take 1 tablet by mouth daily as needed (allergies). Over the counter allergy medication     No current facility-administered medications on file prior to visit.    Review of Systems  Constitutional: Negative for activity change, appetite change, fatigue, fever and unexpected weight change.  HENT: Negative for congestion, ear pain, rhinorrhea, sinus pressure and sore throat.   Eyes:  Negative for pain, redness and visual disturbance.  Respiratory: Negative for cough, shortness of breath and wheezing.   Cardiovascular: Negative for chest pain and palpitations.  Gastrointestinal: Negative for abdominal pain, blood in stool, constipation and diarrhea.  Endocrine: Negative for polydipsia and polyuria.  Genitourinary: Negative for dysuria, frequency and urgency.       Breast lump   Musculoskeletal: Negative for arthralgias, back pain and myalgias.  Skin: Negative for pallor and rash.  Allergic/Immunologic: Negative for environmental allergies.  Neurological: Negative for dizziness, syncope and headaches.  Hematological: Negative for adenopathy. Does not bruise/bleed easily.  Psychiatric/Behavioral: Negative for decreased concentration and dysphoric mood. The patient is not nervous/anxious.        Objective:   Physical Exam Constitutional:      General: She is not in acute distress.    Appearance: Normal appearance. She is normal weight. She is not ill-appearing.  HENT:     Head: Normocephalic and atraumatic.  Eyes:     General: No scleral icterus.    Conjunctiva/sclera: Conjunctivae normal.     Pupils: Pupils are equal, round, and reactive to light.  Cardiovascular:     Rate and Rhythm: Normal rate and regular rhythm.  Pulmonary:     Effort: Pulmonary effort is normal. No respiratory distress.     Breath sounds: Normal breath sounds. No wheezing.  Genitourinary:    Comments: R breast- small superficial lump palpated under middle of scar (around 7:00 pos) in R breast  No skin color change or tenderness or nipple d/c   Breasts are more dense than expected for age   Musculoskeletal:     Cervical back: Normal range of motion and neck supple.     Right lower leg: No edema.     Left lower leg: No edema.  Lymphadenopathy:     Cervical: No cervical adenopathy.  Skin:    Coloration: Skin is not pale.  Findings: No erythema or rash.  Neurological:     Mental  Status: She is alert.  Psychiatric:        Mood and Affect: Mood normal.           Assessment & Plan:   Problem List Items Addressed This Visit      Other   Breast lump on right side at 8 o'clock position - Primary    Superficial lump palp under lumpectomy scar in R breast  Suspect this will be scar tissue or fibrocystic change  Ref for diag mm and [name]  Pending results       Relevant Orders   MM Digital Diagnostic Bilat   US BREAST LTD UNI RIGHT INC AXILLA   History of breast cancer    Now has a palpable lump (feels superficial) under her scar from past lumpectomy  Sent for diag mm and [name]       Relevant Orders   MM Digital Diagnostic Bilat   US BREAST LTD UNI RIGHT INC AXILLA

## 2020-05-03 NOTE — Telephone Encounter (Signed)
Aware, I will see her then  

## 2020-05-03 NOTE — Telephone Encounter (Signed)
[facility] Night - Client Nonclinical Telephone Record AccessNurse Client [facility] - Client Client Site [facility] Physician Tower, Roque Lias - MD Contact Type Call Who Is Calling Patient / Member / Family / Caregiver Caller Name [place] Phone Number [phone] Patient Name Gabrielle Webb Patient DOB Oct 17, 2041 Call Type Message Only Information Provided Reason for Call Request to Schedule Office Appointment Initial Comment Caller states she needs to know if she needs to make an appt there or at the [facility], she has found another lump. Additional Comment declines triage. Disp. Time Disposition Final User 05/03/2020 8:08:18 AM General Information Provided Yes Green, Amy Call Closed By: Philis Kendall Transaction Date/Time: 05/03/2020 8:05:28 AM (ET)

## 2020-05-03 NOTE — Assessment & Plan Note (Signed)
Now has a palpable lump (feels superficial) under her scar from past lumpectomy  Sent for diag mm and [name]

## 2020-05-10 ENCOUNTER — Other Ambulatory Visit: Payer: Self-pay

## 2020-05-10 ENCOUNTER — Ambulatory Visit (INDEPENDENT_AMBULATORY_CARE_PROVIDER_SITE_OTHER): Payer: PPO

## 2020-05-10 DIAGNOSIS — M810 Age-related osteoporosis without current pathological fracture: Secondary | ICD-10-CM | POA: Diagnosis not present

## 2020-05-10 MED ORDER — DENOSUMAB 60 MG/ML ~~LOC~~ SOSY
60.0000 mg | PREFILLED_SYRINGE | Freq: Once | SUBCUTANEOUS | Status: AC
  Administered 2020-05-10: 60 mg via SUBCUTANEOUS

## 2020-05-10 NOTE — Progress Notes (Signed)
Per orders of Dr. Glori Bickers, injection of Prolia given by Randall An. Patient tolerated injection well. Pt brought medication in from pharmacy.

## 2020-05-11 DIAGNOSIS — D0462 Carcinoma in situ of skin of left upper limb, including shoulder: Secondary | ICD-10-CM | POA: Diagnosis not present

## 2020-05-13 ENCOUNTER — Other Ambulatory Visit: Payer: Self-pay

## 2020-05-13 ENCOUNTER — Ambulatory Visit
Admission: RE | Admit: 2020-05-13 | Discharge: 2020-05-13 | Disposition: A | Discharge status: Home / Self Care | Payer: PPO | Source: Ambulatory Visit | Attending: Family Medicine | Admitting: Family Medicine

## 2020-05-13 ENCOUNTER — Other Ambulatory Visit: Payer: Self-pay | Admitting: Family Medicine

## 2020-05-13 DIAGNOSIS — N6313 Unspecified lump in the right breast, lower outer quadrant: Secondary | ICD-10-CM

## 2020-05-13 DIAGNOSIS — R928 Other abnormal and inconclusive findings on diagnostic imaging of breast: Secondary | ICD-10-CM | POA: Diagnosis not present

## 2020-05-13 DIAGNOSIS — Z853 Personal history of malignant neoplasm of breast: Secondary | ICD-10-CM

## 2020-05-13 DIAGNOSIS — N6311 Unspecified lump in the right breast, upper outer quadrant: Secondary | ICD-10-CM | POA: Diagnosis not present

## 2020-05-27 ENCOUNTER — Other Ambulatory Visit: Payer: Self-pay

## 2020-05-27 ENCOUNTER — Ambulatory Visit
Admission: RE | Admit: 2020-05-27 | Discharge: 2020-05-27 | Disposition: A | Discharge status: Home / Self Care | Payer: PPO | Source: Ambulatory Visit | Attending: Family Medicine | Admitting: Family Medicine

## 2020-05-27 ENCOUNTER — Other Ambulatory Visit: Payer: Self-pay | Admitting: Family Medicine

## 2020-05-27 DIAGNOSIS — Z853 Personal history of malignant neoplasm of breast: Secondary | ICD-10-CM

## 2020-05-27 DIAGNOSIS — N6313 Unspecified lump in the right breast, lower outer quadrant: Secondary | ICD-10-CM

## 2020-05-27 DIAGNOSIS — N6311 Unspecified lump in the right breast, upper outer quadrant: Secondary | ICD-10-CM | POA: Diagnosis not present

## 2020-05-27 DIAGNOSIS — N641 Fat necrosis of breast: Secondary | ICD-10-CM | POA: Diagnosis not present

## 2020-06-12 ENCOUNTER — Telehealth: Payer: Self-pay | Admitting: Family Medicine

## 2020-06-12 DIAGNOSIS — Z0000 Encounter for general adult medical examination without abnormal findings: Secondary | ICD-10-CM

## 2020-06-12 DIAGNOSIS — E7800 Pure hypercholesterolemia, unspecified: Secondary | ICD-10-CM

## 2020-06-12 DIAGNOSIS — M818 Other osteoporosis without current pathological fracture: Secondary | ICD-10-CM

## 2020-06-12 DIAGNOSIS — E039 Hypothyroidism, unspecified: Secondary | ICD-10-CM

## 2020-06-12 NOTE — Telephone Encounter (Signed)
-----   Message from Cloyd Stagers, RT sent at 05/31/2020  1:32 PM EDT ----- Regarding: Lab Orders for Monday 7.12.2021 Please place lab orders for Monday 7.12.2021, office visit for physical on Monday 7.19.2021 Thank you, Dyke Maes RT(R)

## 2020-06-13 ENCOUNTER — Other Ambulatory Visit: Payer: Self-pay

## 2020-06-13 ENCOUNTER — Other Ambulatory Visit (INDEPENDENT_AMBULATORY_CARE_PROVIDER_SITE_OTHER): Payer: PPO

## 2020-06-13 DIAGNOSIS — Z0000 Encounter for general adult medical examination without abnormal findings: Secondary | ICD-10-CM

## 2020-06-13 DIAGNOSIS — E7800 Pure hypercholesterolemia, unspecified: Secondary | ICD-10-CM

## 2020-06-13 DIAGNOSIS — M818 Other osteoporosis without current pathological fracture: Secondary | ICD-10-CM | POA: Diagnosis not present

## 2020-06-13 DIAGNOSIS — E039 Hypothyroidism, unspecified: Secondary | ICD-10-CM | POA: Diagnosis not present

## 2020-06-13 DIAGNOSIS — M810 Age-related osteoporosis without current pathological fracture: Secondary | ICD-10-CM

## 2020-06-13 LAB — COMPREHENSIVE METABOLIC PANEL
ALT: 10 U/L (ref 0–35)
AST: 18 U/L (ref 0–37)
Albumin: 4.6 g/dL (ref 3.5–5.2)
Alkaline Phosphatase: 50 U/L (ref 39–117)
BUN: 13 mg/dL (ref 6–23)
CO2: 29 mEq/L (ref 19–32)
Calcium: 9.6 mg/dL (ref 8.4–10.5)
Chloride: 104 mEq/L (ref 96–112)
Creatinine, Ser: 0.78 mg/dL (ref 0.40–1.20)
GFR: 71.6 mL/min (ref >60.00)
Glucose, Bld: 87 mg/dL (ref 70–99)
Potassium: 3.9 mEq/L (ref 3.5–5.1)
Sodium: 141 mEq/L (ref 135–145)
Total Bilirubin: 0.9 mg/dL (ref 0.2–1.2)
Total Protein: 6.6 g/dL (ref 6.0–8.3)

## 2020-06-13 LAB — BASIC METABOLIC PANEL
BUN: 13 mg/dL (ref 6–23)
CO2: 29 mEq/L (ref 19–32)
Calcium: 9.6 mg/dL (ref 8.4–10.5)
Chloride: 104 mEq/L (ref 96–112)
Creatinine, Ser: 0.78 mg/dL (ref 0.40–1.20)
GFR: 71.6 mL/min (ref >60.00)
Glucose, Bld: 87 mg/dL (ref 70–99)
Potassium: 3.9 mEq/L (ref 3.5–5.1)
Sodium: 141 mEq/L (ref 135–145)

## 2020-06-13 LAB — LIPID PANEL
Cholesterol: 175 mg/dL (ref 0–200)
HDL: 59.9 mg/dL (ref >39.00)
LDL Cholesterol: 94 mg/dL (ref 0–99)
NonHDL: 114.67
Total CHOL/HDL Ratio: 3
Triglycerides: 105 mg/dL (ref 0.0–149.0)
VLDL: 21 mg/dL (ref 0.0–40.0)

## 2020-06-13 LAB — CBC WITH DIFFERENTIAL/PLATELET
Basophils Absolute: 0 10*3/uL (ref 0.0–0.1)
Basophils Relative: 0.3 % (ref 0.0–3.0)
Eosinophils Absolute: 0.1 10*3/uL (ref 0.0–0.7)
Eosinophils Relative: 1.5 % (ref 0.0–5.0)
HCT: 40.2 % (ref 36.0–46.0)
Hemoglobin: 13.8 g/dL (ref 12.0–15.0)
Lymphocytes Relative: 40.7 % (ref 12.0–46.0)
Lymphs Abs: 2.7 10*3/uL (ref 0.7–4.0)
MCHC: 34.3 g/dL (ref 30.0–36.0)
MCV: 89.4 fl (ref 78.0–100.0)
Monocytes Absolute: 0.6 10*3/uL (ref 0.1–1.0)
Monocytes Relative: 9.8 % (ref 3.0–12.0)
Neutro Abs: 3.2 10*3/uL (ref 1.4–7.7)
Neutrophils Relative %: 47.7 % (ref 43.0–77.0)
Platelets: 163 10*3/uL (ref 150.0–400.0)
RBC: 4.49 Mil/uL (ref 3.87–5.11)
RDW: 13.8 % (ref 11.5–15.5)
WBC: 6.6 10*3/uL (ref 4.0–10.5)

## 2020-06-13 LAB — TSH: TSH: 1.85 u[IU]/mL (ref 0.35–4.50)

## 2020-06-13 LAB — VITAMIN D 25 HYDROXY (VIT D DEFICIENCY, FRACTURES): VITD: 60.47 ng/mL (ref 30.00–100.00)

## 2020-06-14 ENCOUNTER — Ambulatory Visit: Payer: PPO

## 2020-06-16 ENCOUNTER — Other Ambulatory Visit: Payer: Self-pay | Admitting: Family Medicine

## 2020-06-20 ENCOUNTER — Other Ambulatory Visit: Payer: Self-pay

## 2020-06-20 ENCOUNTER — Ambulatory Visit (INDEPENDENT_AMBULATORY_CARE_PROVIDER_SITE_OTHER): Payer: PPO | Admitting: Family Medicine

## 2020-06-20 ENCOUNTER — Encounter: Payer: Self-pay | Admitting: Family Medicine

## 2020-06-20 VITALS — BP 116/72 | HR 61 | Temp 96.9°F | Ht 63.0 in | Wt 141.6 lb

## 2020-06-20 DIAGNOSIS — E039 Hypothyroidism, unspecified: Secondary | ICD-10-CM | POA: Diagnosis not present

## 2020-06-20 DIAGNOSIS — M818 Other osteoporosis without current pathological fracture: Secondary | ICD-10-CM

## 2020-06-20 DIAGNOSIS — E7800 Pure hypercholesterolemia, unspecified: Secondary | ICD-10-CM | POA: Diagnosis not present

## 2020-06-20 DIAGNOSIS — Z0000 Encounter for general adult medical examination without abnormal findings: Secondary | ICD-10-CM

## 2020-06-20 DIAGNOSIS — Z853 Personal history of malignant neoplasm of breast: Secondary | ICD-10-CM

## 2020-06-20 DIAGNOSIS — N6313 Unspecified lump in the right breast, lower outer quadrant: Secondary | ICD-10-CM

## 2020-06-20 MED ORDER — FLUTICASONE PROPIONATE 50 MCG/ACT NA SUSP: NASAL | 11 refills | Status: DC

## 2020-06-20 MED ORDER — DICYCLOMINE HCL 10 MG PO CAPS: ORAL_CAPSULE | ORAL | 5 refills | Status: DC

## 2020-06-20 NOTE — Patient Instructions (Addendum)
Get back to walking when you can   Take care of yourself   Try to get in more fluids for kidney and bowel health  Optimally 64 oz per day (more water than anything else)  Also eat your fruits and veggies   Labs look good

## 2020-06-20 NOTE — Assessment & Plan Note (Signed)
Biopsy neg - scar tissue

## 2020-06-20 NOTE — Assessment & Plan Note (Signed)
Hypothyroidism  Pt has no clinical changes No change in energy level/ hair or skin/ edema and no tremor Lab Results  Component Value Date   TSH 1.85 06/13/2020

## 2020-06-20 NOTE — Assessment & Plan Note (Signed)
dexa 8/19 reviewed - next one is scheduled with upcoming mammogram  Doing well with prolia  Past tx with tamoxifen  No falls or fx  Taking ca and D (D level is good)  Good exercise habits

## 2020-06-20 NOTE — Assessment & Plan Note (Signed)
Reviewed health habits including diet and exercise and skin cancer prevention Reviewed appropriate screening tests for age  Also reviewed health mt list, fam hx and immunization status , as well as social and family history   See HPI Labs reviewed  Gyn visit planned for tomorrow  Low risk for hep C- def screening  mammogram and dexa planned next mo (continues breast cancer f/u with oncology as well as prolia)  Recall colonoscopy due 9/22 covid immunized Has had shingrix vaccines

## 2020-06-20 NOTE — Assessment & Plan Note (Signed)
Recent bx negative  Mammogram planned next mo  Done with tamoxifen Continues oncol f/u

## 2020-06-20 NOTE — Assessment & Plan Note (Signed)
Disc goals for lipids and reasons to control them Rev last labs with pt Rev low sat fat diet in detail  Controlled with atorvastatin and diet 

## 2020-06-20 NOTE — Progress Notes (Signed)
Subjective:    Patient ID: Gabrielle Webb, female    DOB: 1941/10/28, 79 y.o.   MRN: [id number]  This visit occurred during the SARS-CoV-2 public health emergency.  Safety protocols were in place, including screening questions prior to the visit, additional usage of staff PPE, and extensive cleaning of exam room while observing appropriate contact time as indicated for disinfecting solutions.    HPI Here for health maintenance exam and to review chronic medical problems    She has amw planned next week   Wt Readings from Last 3 Encounters:  06/20/20 141 lb 9 oz (64.2 kg)  05/03/20 142 lb 4 oz (64.5 kg)  08/13/19 140 lb 4.8 oz (63.6 kg)   25.08 kg/m   Feeling ok overall  Gets tired more easily   Pt sees gyn tomorrow am    Hep C screening -declined low risk    Mammogram 8/20 -has is scheduled next mo  Personal history of R breast cancer and multiple bx  Had recent R breast bx for scar tissue at lumpectomy site  Self breast exam   Colonoscopy 9/19 -small polyp  Recall is 2022    Tdap 1/19  Flu shot 9/20  pna vaccines utd covid status -immunized  Had the shingrix vaccines   dexa 8/19 -stable osteoporosis (she has that scheduled when she has her mammogram) Taking prolia-no problems  Past h/o tamoxifen Falls- none fx -none Supplements -taking her ca and D Exercise -walking and does exercises in the house  D level is good at 60.4   BP Readings from Last 3 Encounters:  06/20/20 116/72  05/03/20 118/70  08/13/19 106/64   Pulse Readings from Last 3 Encounters:  06/20/20 61  05/03/20 83  12/17/19 72    Hypothyroidism  Pt has no clinical changes No change in energy level/ hair or skin/ edema and no tremor Lab Results  Component Value Date   TSH 1.85 06/13/2020      Hyperlipidemia Lab Results  Component Value Date   CHOL 175 06/13/2020   CHOL 169 06/03/2019   CHOL 165 07/28/2018   Lab Results  Component Value Date   HDL 59.90 06/13/2020   HDL  62.20 06/03/2019   HDL 65.40 07/28/2018   Lab Results  Component Value Date   LDLCALC 94 06/13/2020   LDLCALC 87 06/03/2019   LDLCALC 82 07/28/2018   Lab Results  Component Value Date   TRIG 105.0 06/13/2020   TRIG 96.0 06/03/2019   TRIG 89.0 07/28/2018   Lab Results  Component Value Date   CHOLHDL 3 06/13/2020   CHOLHDL 3 06/03/2019   CHOLHDL 3 07/28/2018   Lab Results  Component Value Date   LDLDIRECT 142.0 03/19/2011   LDLDIRECT 125.4 12/08/2009   LDLDIRECT 172.0 09/19/2009   Taking atorvastatin   Diet is fair - does not cook a lot  Eats veggies at [employer] occas   Other labs ok Lab Results  Component Value Date   WBC 6.6 06/13/2020   HGB 13.8 06/13/2020   HCT 40.2 06/13/2020   MCV 89.4 06/13/2020   PLT 163.0 06/13/2020   Lab Results  Component Value Date   CREATININE 0.78 06/13/2020   CREATININE 0.78 06/13/2020   BUN 13 06/13/2020   BUN 13 06/13/2020   NA 141 06/13/2020   NA 141 06/13/2020   K 3.9 06/13/2020   K 3.9 06/13/2020   CL 104 06/13/2020   CL 104 06/13/2020   CO2 29 06/13/2020  CO2 29 06/13/2020   Lab Results  Component Value Date   ALT 10 06/13/2020   AST 18 06/13/2020   ALKPHOS 50 06/13/2020   BILITOT 0.9 06/13/2020    More stomach cramps/bloating/gas  Worse if heavy or spicy foods  Takes bentyl prn -needs to take more often   Has lost some hearing in R ear -sees Dr Pryor Ochoa  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 Breast lump on right side at 8 o'clock position 05/03/2020  . History of breast cancer 05/03/2020  . Medicare annual wellness visit, subsequent 05/28/2018  . Estrogen deficiency 05/28/2018  . Fatigue 05/22/2017  . Chest discomfort 05/22/2017  . S/P TAH-BSO 11/08/2016  . Routine general medical examination at a health care facility 05/06/2016  . Malignant neoplasm of upper-outer quadrant of right breast in female, estrogen receptor positive  ([place]) 03/21/2015  . History of tamoxifen therapy 05/18/2014  . Encounter for routine gynecological examination 05/06/2013  . Colon cancer screening 05/06/2013  . Encounter for Medicare annual wellness exam 04/28/2013  . Hx of radiation therapy   . Gynecological examination 12/25/2011  . Hx of Breast cancer, stage 1, Right, UOQ, Receptor+,Her2- 05/29/2011    Class: Stage 1  . Other screening mammogram 03/21/2011  . Post-menopausal 03/21/2011  . Hypothyroidism 01/23/2008  . Pure hypercholesterolemia 01/23/2008  . ALLERGIC RHINITIS 01/23/2008  . GERD 01/23/2008  . DIVERTICULOSIS, COLON 01/23/2008  . IBS 01/23/2008  . FIBROCYSTIC BREAST DISEASE 01/23/2008  . Osteoporosis 10/17/2007   Past Medical History:  Diagnosis Date  . Allergy   . Anxiety   . Breast cancer ([place]) 7/12   Right  . Cellulitis   . Diverticulosis of colon   . GERD (gastroesophageal reflux disease)   . Headache    sinus  . History of hiatal hernia   . Hx of radiation therapy 06/20/11 - 07/11/11   right breast  . Hypothyroid   . Osteoporosis   . Personal history of radiation therapy 2012   Right Breast Cancer  . PONV (postoperative nausea and vomiting)   . Skin cancer    basal and squamous cell  . Vertigo    Past Surgical History:  Procedure Laterality Date  . ABDOMINAL HYSTERECTOMY N/A 11/08/2016   Procedure: HYSTERECTOMY TOTAL  ABDOMINAL;  Surgeon: Dian Queen, MD;  Location: [facility];  Service: Gynecology;  Laterality: N/A;  . APPENDECTOMY  07/2007  . back sugery  1970  . BREAST BIOPSY Left    Benign  . BREAST EXCISIONAL BIOPSY Left 1983  . BREAST EXCISIONAL BIOPSY Left 1983  . BREAST LUMPECTOMY Right 05/16/2011  . COLONOSCOPY    . COLONOSCOPY WITH PROPOFOL N/A 08/27/2018   Procedure: COLONOSCOPY WITH PROPOFOL;  Surgeon: Manya Silvas, MD;  Location: [facility];  Service: Endoscopy;  Laterality: N/A;  . DILATION AND CURETTAGE OF UTERUS N/A 05/31/2014   Procedure: DILATATION AND CURETTAGE WITH  ULTRASOUND GUIDANCE;  Surgeon: Cyril Mourning, MD;  Location: [facility];  Service: Gynecology;  Laterality: N/A;  . [place]  . right eye surgery    . SALPINGOOPHORECTOMY Bilateral 11/08/2016   Procedure: BILATERAL SALPINGO OOPHORECTOMY;  Surgeon: Dian Queen, MD;  Location: [facility];  Service: Gynecology;  Laterality: Bilateral;  . UPPER GI ENDOSCOPY     Social History   Tobacco Use  . Smoking status: Never Smoker  . Smokeless tobacco: Never Used  Vaping  Use  . Vaping Use: Never used  Substance Use Topics  . Alcohol use: No    Alcohol/week: 0.0 standard drinks  . Drug use: No   Family History  Problem Relation Age of Onset  . Heart attack Father 26  . Hypertension Father   . Atrial fibrillation Mother   . Coronary artery disease Mother   . Diabetes Other        Grandmother  . Coronary artery disease Other        Grandmother  . Uterine cancer Other        Grandmother  . Leukemia Other        Aunt  . Cancer Maternal Aunt        leukemia  . Cancer Maternal Uncle        colon   Allergies  Allergen Reactions  . Codeine Nausea And Vomiting  . Fentanyl Nausea And Vomiting  . Hydromorphone Nausea And Vomiting       . Morphine Nausea And Vomiting  . Tape     Surgical tape  . Prednisone Palpitations    Rapid hear beat   Current Outpatient Medications on File Prior to Visit  Medication Sig Dispense Refill  . aspirin 81 MG tablet Take 81 mg by mouth daily.     Marland Kitchen atorvastatin (LIPITOR) 10 MG tablet TAKE ONE TABLET BY MOUTH EVERY DAY 90 tablet 3  . BIOTIN PO Take by mouth daily.    Marland Kitchen CALCIUM PO Take 1,500 mg by mouth daily.    . chlorthalidone (HYGROTON) 50 MG tablet TAKE 1 TABLET BY MOUTH DAILY AS NEEDED FOR SWELLING 90 tablet 2  . Cholecalciferol (VITAMIN D) 2000 UNITS CAPS Take 1 capsule by mouth daily.    . Cyanocobalamin (B-12 PO) Take 2,500 mcg by mouth daily.    Marland Kitchen denosumab (PROLIA) 60 MG/ML SOSY injection Inject 60 mg into the skin every 6 (six)  months. 1 mL 0  . diclofenac sodium (VOLTAREN) 1 % GEL Apply 2 g topically 4 (four) times daily. Rub into affected area of foot 2 to 4 times daily (Patient taking differently: Apply 2 g topically as needed. Rub into affected area of foot 2 to 4 times daily) 100 g 2  . famotidine (PEPCID) 20 MG tablet TAKE ONE TABLET EVERY DAY AS NEEDED FOR HEARTBURN OR INDIGESTION 90 tablet 1  . levothyroxine (SYNTHROID) 100 MCG tablet TAKE ONE TABLET EVERY DAY 90 tablet 3  . LUTEIN PO Take 6 mg by mouth daily.    . Multiple Vitamin (MULTIVITAMIN) capsule Take 1 capsule by mouth daily.      . Naproxen Sodium (ALEVE PO) Take 1 tablet by mouth as needed.    . niacin 500 MG tablet Take 1,000 mg by mouth at bedtime.     Marland Kitchen OVER THE COUNTER MEDICATION Take 1 tablet by mouth daily as needed (allergies). Over the counter allergy medication     No current facility-administered medications on file prior to visit.      Review of Systems  Constitutional: Positive for fatigue. Negative for activity change, appetite change, fever and unexpected weight change.  HENT: Negative for congestion, ear pain, rhinorrhea, sinus pressure and sore throat.   Eyes: Negative for pain, redness and visual disturbance.  Respiratory: Negative for cough, shortness of breath and wheezing.   Cardiovascular: Negative for chest pain and palpitations.  Gastrointestinal: Negative for abdominal pain, blood in stool, constipation and diarrhea.  Endocrine: Negative for polydipsia and polyuria.  Genitourinary: Negative for  dysuria, frequency and urgency.  Musculoskeletal: Negative for arthralgias, back pain and myalgias.  Skin: Negative for pallor and rash.  Allergic/Immunologic: Negative for environmental allergies.  Neurological: Negative for dizziness, syncope and headaches.  Hematological: Negative for adenopathy. Does not bruise/bleed easily.  Psychiatric/Behavioral: Negative for decreased concentration and dysphoric mood. The patient is not  nervous/anxious.        Objective:   Physical Exam Constitutional:      General: She is not in acute distress.    Appearance: Normal appearance. She is well-developed and normal weight. She is not ill-appearing or diaphoretic.  HENT:     Head: Normocephalic and atraumatic.     Right Ear: Tympanic membrane, ear canal and external ear normal.     Left Ear: Tympanic membrane, ear canal and external ear normal.     Nose: Nose normal. No congestion.     Mouth/Throat:     Mouth: Mucous membranes are moist.     Pharynx: Oropharynx is clear. No posterior oropharyngeal erythema.  Eyes:     General: No scleral icterus.    Extraocular Movements: Extraocular movements intact.     Conjunctiva/sclera: Conjunctivae normal.     Pupils: Pupils are equal, round, and reactive to light.  Neck:     Thyroid: No thyromegaly.     Vascular: No carotid bruit or JVD.  Cardiovascular:     Rate and Rhythm: Normal rate and regular rhythm.     Pulses: Normal pulses.     Heart sounds: Normal heart sounds. No gallop.   Pulmonary:     Effort: Pulmonary effort is normal. No respiratory distress.     Breath sounds: Normal breath sounds. No wheezing.     Comments: Good air exch Chest:     Chest wall: No tenderness.  Abdominal:     General: Bowel sounds are normal. There is no distension or abdominal bruit.     Palpations: Abdomen is soft. There is no mass.     Tenderness: There is no abdominal tenderness.     Hernia: No hernia is present.  Genitourinary:    Comments: Breast exam done by gyn and oncology   Musculoskeletal:        General: No tenderness. Normal range of motion.     Cervical back: Normal range of motion and neck supple. No rigidity. No muscular tenderness.     Right lower leg: No edema.     Left lower leg: No edema.  Lymphadenopathy:     Cervical: No cervical adenopathy.  Skin:    General: Skin is warm and dry.     Coloration: Skin is not pale.     Findings: No erythema or rash.      Comments: Solar lentigines diffusely Some sks   Neurological:     Mental Status: She is alert. Mental status is at baseline.     Cranial Nerves: No cranial nerve deficit.     Motor: No abnormal muscle tone.     Coordination: Coordination normal.     Gait: Gait normal.     Deep Tendon Reflexes: Reflexes are normal and symmetric. Reflexes normal.  Psychiatric:        Mood and Affect: Mood normal.        Cognition and Memory: Cognition and memory normal.           Assessment & Plan:   Problem List Items Addressed This Visit      Endocrine   Hypothyroidism    Hypothyroidism  Pt has  no clinical changes No change in energy level/ hair or skin/ edema and no tremor Lab Results  Component Value Date   TSH 1.85 06/13/2020            Musculoskeletal and Integument   Osteoporosis    dexa 8/19 reviewed - next one is scheduled with upcoming mammogram  Doing well with prolia  Past tx with tamoxifen  No falls or fx  Taking ca and D (D level is good)  Good exercise habits          Other   Pure hypercholesterolemia    Disc goals for lipids and reasons to control them Rev last labs with pt Rev low sat fat diet in detail  Controlled with atorvastatin and diet       Routine general medical examination at a health care facility - Primary    Reviewed health habits including diet and exercise and skin cancer prevention Reviewed appropriate screening tests for age  Also reviewed health mt list, fam hx and immunization status , as well as social and family history   See HPI Labs reviewed  Gyn visit planned for tomorrow  Low risk for hep C- def screening  mammogram and dexa planned next mo (continues breast cancer f/u with oncology as well as prolia)  Recall colonoscopy due 9/22 covid immunized Has had shingrix vaccines       Breast lump on right side at 8 o'clock position    Biopsy neg - scar tissue      History of breast cancer    Recent bx negative  Mammogram  planned next mo  Done with tamoxifen Continues oncol f/u

## 2020-06-21 ENCOUNTER — Ambulatory Visit: Payer: PPO

## 2020-06-21 DIAGNOSIS — Z01419 Encounter for gynecological examination (general) (routine) without abnormal findings: Secondary | ICD-10-CM | POA: Diagnosis not present

## 2020-06-21 DIAGNOSIS — Z6824 Body mass index (BMI) 24.0-24.9, adult: Secondary | ICD-10-CM | POA: Diagnosis not present

## 2020-06-21 DIAGNOSIS — Z853 Personal history of malignant neoplasm of breast: Secondary | ICD-10-CM | POA: Diagnosis not present

## 2020-06-21 DIAGNOSIS — Z800 Family history of malignant neoplasm of digestive organs: Secondary | ICD-10-CM | POA: Diagnosis not present

## 2020-06-21 DIAGNOSIS — Z806 Family history of leukemia: Secondary | ICD-10-CM | POA: Diagnosis not present

## 2020-06-21 DIAGNOSIS — Z86010 Personal history of colonic polyps: Secondary | ICD-10-CM | POA: Diagnosis not present

## 2020-06-22 ENCOUNTER — Other Ambulatory Visit: Payer: Self-pay

## 2020-06-22 ENCOUNTER — Ambulatory Visit (INDEPENDENT_AMBULATORY_CARE_PROVIDER_SITE_OTHER): Payer: PPO

## 2020-06-22 VITALS — BP 116/72 | HR 61 | Ht 63.0 in | Wt 141.6 lb

## 2020-06-22 DIAGNOSIS — Z0000 Encounter for general adult medical examination without abnormal findings: Secondary | ICD-10-CM | POA: Diagnosis not present

## 2020-06-22 NOTE — Progress Notes (Signed)
PCP notes:  Health Maintenance: No gaps noted    Abnormal Screenings: none   Patient concerns: none   Nurse concerns: none   Next PCP appt: none 

## 2020-06-22 NOTE — Patient Instructions (Signed)
Gabrielle Webb , Thank you for taking time to come for your Medicare Wellness Visit. I appreciate your ongoing commitment to your health goals. Please review the following plan we discussed and let me know if I can assist you in the future.   Screening recommendations/referrals: Colonoscopy: Up to date, completed 08/27/2018, no longer required Mammogram: Up to date, completed 07/17/2019, scheduled 07/18/2020 Bone Density: Up to date, completed 07/09/2018, due 07/2020 Recommended yearly ophthalmology/optometry visit for glaucoma screening and checkup Recommended yearly dental visit for hygiene and checkup  Vaccinations: Influenza vaccine: Up to date, completed 08/27/2019, due 07/2020 Pneumococcal vaccine: Completed series Tdap vaccine: Up to date, completed 12/10/2017, due 12/2027 Shingles vaccine: Up to date, completed    Covid-19:Completed series  Advanced directives: copy in chart  Conditions/risks identified: hypercholesterolemia  Next appointment: Follow up in one year for your annual wellness visit    Preventive Care 32 Years and Older, Female Preventive care refers to lifestyle choices and visits with your health care provider that can promote health and wellness. What does preventive care include?  A yearly physical exam. This is also called an annual well check.  Dental exams once or twice a year.  Routine eye exams. Ask your health care provider how often you should have your eyes checked.  Personal lifestyle choices, including:  Daily care of your teeth and gums.  Regular physical activity.  Eating a healthy diet.  Avoiding tobacco and drug use.  Limiting alcohol use.  Practicing safe sex.  Taking low-dose aspirin every day.  Taking vitamin and mineral supplements as recommended by your health care provider. What happens during an annual well check? The services and screenings done by your health care provider during your annual well check will depend on your age,  overall health, lifestyle risk factors, and family history of disease. Counseling  Your health care provider may ask you questions about your:  Alcohol use.  Tobacco use.  Drug use.  Emotional well-being.  Home and relationship well-being.  Sexual activity.  Eating habits.  History of falls.  Memory and ability to understand (cognition).  Work and work Statistician.  Reproductive health. Screening  You may have the following tests or measurements:  Height, weight, and BMI.  Blood pressure.  Lipid and cholesterol levels. These may be checked every 5 years, or more frequently if you are over 16 years old.  Skin check.  Lung cancer screening. You may have this screening every year starting at age 59 if you have a 30-pack-year history of smoking and currently smoke or have quit within the past 15 years.  Fecal occult blood test (FOBT) of the stool. You may have this test every year starting at age 43.  Flexible sigmoidoscopy or colonoscopy. You may have a sigmoidoscopy every 5 years or a colonoscopy every 10 years starting at age 22.  Hepatitis C blood test.  Hepatitis B blood test.  Sexually transmitted disease (STD) testing.  Diabetes screening. This is done by checking your blood sugar (glucose) after you have not eaten for a while (fasting). You may have this done every 1-3 years.  Bone density scan. This is done to screen for osteoporosis. You may have this done starting at age 69.  Mammogram. This may be done every 1-2 years. Talk to your health care provider about how often you should have regular mammograms. Talk with your health care provider about your test results, treatment options, and if necessary, the need for more tests. Vaccines  Your health care  provider may recommend certain vaccines, such as:  Influenza vaccine. This is recommended every year.  Tetanus, diphtheria, and acellular pertussis (Tdap, Td) vaccine. You may need a Td booster every 10  years.  Zoster vaccine. You may need this after age 10.  Pneumococcal 13-valent conjugate (PCV13) vaccine. One dose is recommended after age 20.  Pneumococcal polysaccharide (PPSV23) vaccine. One dose is recommended after age 29. Talk to your health care provider about which screenings and vaccines you need and how often you need them. This information is not intended to replace advice given to you by your health care provider. Make sure you discuss any questions you have with your health care provider. Document Released: 12/16/2015 Document Revised: 08/08/2016 Document Reviewed: 09/20/2015 Elsevier Interactive Patient Education  2017 [place] Prevention in the Home Falls can cause injuries. They can happen to people of all ages. There are many things you can do to make your home safe and to help prevent falls. What can I do on the outside of my home?  Regularly fix the edges of walkways and driveways and fix any cracks.  Remove anything that might make you trip as you walk through a door, such as a raised step or threshold.  Trim any bushes or trees on the path to your home.  Use bright outdoor lighting.  Clear any walking paths of anything that might make someone trip, such as rocks or tools.  Regularly check to see if handrails are loose or broken. Make sure that both sides of any steps have handrails.  Any raised decks and porches should have guardrails on the edges.  Have any leaves, snow, or ice cleared regularly.  Use sand or salt on walking paths during winter.  Clean up any spills in your garage right away. This includes oil or grease spills. What can I do in the bathroom?  Use night lights.  Install grab bars by the toilet and in the tub and shower. Do not use towel bars as grab bars.  Use non-skid mats or decals in the tub or shower.  If you need to sit down in the shower, use a plastic, non-slip stool.  Keep the floor dry. Clean up any water that  spills on the floor as soon as it happens.  Remove soap buildup in the tub or shower regularly.  Attach bath mats securely with double-sided non-slip rug tape.  Do not have throw rugs and other things on the floor that can make you trip. What can I do in the bedroom?  Use night lights.  Make sure that you have a light by your bed that is easy to reach.  Do not use any sheets or blankets that are too big for your bed. They should not hang down onto the floor.  Have a firm chair that has side arms. You can use this for support while you get dressed.  Do not have throw rugs and other things on the floor that can make you trip. What can I do in the kitchen?  Clean up any spills right away.  Avoid walking on wet floors.  Keep items that you use a lot in easy-to-reach places.  If you need to reach something above you, use a strong step stool that has a grab bar.  Keep electrical cords out of the way.  Do not use floor polish or wax that makes floors slippery. If you must use wax, use non-skid floor wax.  Do not have throw  rugs and other things on the floor that can make you trip. What can I do with my stairs?  Do not leave any items on the stairs.  Make sure that there are handrails on both sides of the stairs and use them. Fix handrails that are broken or loose. Make sure that handrails are as long as the stairways.  Check any carpeting to make sure that it is firmly attached to the stairs. Fix any carpet that is loose or worn.  Avoid having throw rugs at the top or bottom of the stairs. If you do have throw rugs, attach them to the floor with carpet tape.  Make sure that you have a light switch at the top of the stairs and the bottom of the stairs. If you do not have them, ask someone to add them for you. What else can I do to help prevent falls?  Wear shoes that:  Do not have high heels.  Have rubber bottoms.  Are comfortable and fit you well.  Are closed at the  toe. Do not wear sandals.  If you use a stepladder:  Make sure that it is fully opened. Do not climb a closed stepladder.  Make sure that both sides of the stepladder are locked into place.  Ask someone to hold it for you, if possible.  Clearly mark and make sure that you can see:  Any grab bars or handrails.  First and last steps.  Where the edge of each step is.  Use tools that help you move around (mobility aids) if they are needed. These include:  Canes.  Walkers.  Scooters.  Crutches.  Turn on the lights when you go into a dark area. Replace any light bulbs as soon as they burn out.  Set up your furniture so you have a clear path. Avoid moving your furniture around.  If any of your floors are uneven, fix them.  If there are any pets around you, be aware of where they are.  Review your medicines with your doctor. Some medicines can make you feel dizzy. This can increase your chance of falling. Ask your doctor what other things that you can do to help prevent falls. This information is not intended to replace advice given to you by your health care provider. Make sure you discuss any questions you have with your health care provider. Document Released: 09/15/2009 Document Revised: 04/26/2016 Document Reviewed: 12/24/2014 Elsevier Interactive Patient Education  2017 [employer].

## 2020-06-22 NOTE — Progress Notes (Signed)
Subjective:   Gabrielle Webb is a 79 y.o. female who presents for Medicare Annual (Subsequent) preventive examination.  Review of Systems: N/A     I connected with the patient today by telephone and verified that I am speaking with the correct person using two identifiers. Location patient: home Location nurse: work Persons participating in the virtual visit: patient, Marine scientist.   I discussed the limitations, risks, security and privacy concerns of performing an evaluation and management service by telephone and the availability of in person appointments. I also discussed with the patient that there may be a patient responsible charge related to this service. The patient expressed understanding and verbally consented to this telephonic visit.    Interactive audio and video telecommunications were attempted between this nurse and patient, however failed, due to patient having technical difficulties OR patient did not have access to video capability.  We continued and completed visit with audio only.     Cardiac Risk Factors include: advanced age (>64men, >105 women);Other (see comment), Risk factor comments: hypercholesterolemia     Objective:    Today's Vitals   06/22/20 1315  BP: 116/72  Pulse: 61  Weight: 141 lb 9 oz (64.2 kg)  Height: 5\' 3"  (1.6 m)   Body mass index is 25.08 kg/m.  Advanced Directives 06/22/2020 08/27/2018 05/26/2018 05/20/2017 11/08/2016 11/08/2016 10/29/2016  Does Patient Have a Medical Advance Directive? Yes Yes Yes Yes Yes Yes Yes  Type of Paramedic of [place];Living will [place];Living will [place];Living will [place];Living will - Healthcare Power of [place];Living will  Does patient want to make changes to medical advance directive? - - - - No - Patient declined - -  Copy of [place] in Chart? Yes - validated most recent  copy scanned in chart (See row information) No - copy requested Yes No - copy requested - Yes Yes  Would patient like information on creating a medical advance directive? - - - - No - Patient declined - -    Current Medications (verified) Outpatient Encounter Medications as of 06/22/2020  Medication Sig  . aspirin 81 MG tablet Take 81 mg by mouth daily.   Marland Kitchen atorvastatin (LIPITOR) 10 MG tablet TAKE ONE TABLET BY MOUTH EVERY DAY  . BIOTIN PO Take by mouth daily.  Marland Kitchen CALCIUM PO Take 1,500 mg by mouth daily.  . chlorthalidone (HYGROTON) 50 MG tablet TAKE 1 TABLET BY MOUTH DAILY AS NEEDED FOR SWELLING  . Cholecalciferol (VITAMIN D) 2000 UNITS CAPS Take 1 capsule by mouth daily.  . Cyanocobalamin (B-12 PO) Take 2,500 mcg by mouth daily.  Marland Kitchen denosumab (PROLIA) 60 MG/ML SOSY injection Inject 60 mg into the skin every 6 (six) months.  . diclofenac sodium (VOLTAREN) 1 % GEL Apply 2 g topically 4 (four) times daily. Rub into affected area of foot 2 to 4 times daily (Patient taking differently: Apply 2 g topically as needed. Rub into affected area of foot 2 to 4 times daily)  . dicyclomine (BENTYL) 10 MG capsule TAKE 1 CAPSULE BY MOUTH TWICE DAILY FOR SPASMS  . famotidine (PEPCID) 20 MG tablet TAKE ONE TABLET EVERY DAY AS NEEDED FOR HEARTBURN OR INDIGESTION  . fluticasone (FLONASE) 50 MCG/ACT nasal spray TWO SPRAYS IN EACH NOSTRIL EVERY DAY  . levothyroxine (SYNTHROID) 100 MCG tablet TAKE ONE TABLET EVERY DAY  . LUTEIN PO Take 6 mg by mouth daily.  . Multiple Vitamin (  MULTIVITAMIN) capsule Take 1 capsule by mouth daily.    . Naproxen Sodium (ALEVE PO) Take 1 tablet by mouth as needed.  . niacin 500 MG tablet Take 1,000 mg by mouth at bedtime.   Marland Kitchen OVER THE COUNTER MEDICATION Take 1 tablet by mouth daily as needed (allergies). Over the counter allergy medication   No facility-administered encounter medications on file as of 06/22/2020.    Allergies (verified) Codeine, Fentanyl, Hydromorphone, Morphine,  Tape, and Prednisone   History: Past Medical History:  Diagnosis Date  . Allergy   . Anxiety   . Breast cancer ([place]) 7/12   Right  . Cellulitis   . Diverticulosis of colon   . GERD (gastroesophageal reflux disease)   . Headache    sinus  . History of hiatal hernia   . Hx of radiation therapy 06/20/11 - 07/11/11   right breast  . Hypothyroid   . Osteoporosis   . Personal history of radiation therapy 2012   Right Breast Cancer  . PONV (postoperative nausea and vomiting)   . Skin cancer    basal and squamous cell  . Vertigo    Past Surgical History:  Procedure Laterality Date  . ABDOMINAL HYSTERECTOMY N/A 11/08/2016   Procedure: HYSTERECTOMY TOTAL  ABDOMINAL;  Surgeon: Dian Queen, MD;  Location: [facility];  Service: Gynecology;  Laterality: N/A;  . APPENDECTOMY  07/2007  . back sugery  1970  . BREAST BIOPSY Left    Benign  . BREAST EXCISIONAL BIOPSY Left 1983  . BREAST EXCISIONAL BIOPSY Left 1983  . BREAST LUMPECTOMY Right 05/16/2011  . COLONOSCOPY    . COLONOSCOPY WITH PROPOFOL N/A 08/27/2018   Procedure: COLONOSCOPY WITH PROPOFOL;  Surgeon: Manya Silvas, MD;  Location: [facility] ENDOSCOPY;  Service: Endoscopy;  Laterality: N/A;  . DILATION AND CURETTAGE OF UTERUS N/A 05/31/2014   Procedure: DILATATION AND CURETTAGE WITH ULTRASOUND GUIDANCE;  Surgeon: Cyril Mourning, MD;  Location: [facility];  Service: Gynecology;  Laterality: N/A;  . [place]  . right eye surgery    . SALPINGOOPHORECTOMY Bilateral 11/08/2016   Procedure: BILATERAL SALPINGO OOPHORECTOMY;  Surgeon: Dian Queen, MD;  Location: [facility];  Service: Gynecology;  Laterality: Bilateral;  . UPPER GI ENDOSCOPY     Family History  Problem Relation Age of Onset  . Heart attack Father 66  . Hypertension Father   . Atrial fibrillation Mother   . Coronary artery disease Mother   . Diabetes Other        Grandmother  . Coronary artery disease Other        Grandmother  . Uterine cancer Other         Grandmother  . Leukemia Other        Aunt  . Cancer Maternal Aunt        leukemia  . Cancer Maternal Uncle        colon   Social History   Socioeconomic History  . Marital status: Widowed    Spouse name: Not on file  . Number of children: 1  . Years of education: Not on file  . Highest education level: Not on file  Occupational History  . Occupation: Human resources officer  Tobacco Use  . Smoking status: Never Smoker  . Smokeless tobacco: Never Used  Vaping Use  . Vaping Use: Never used  Substance and Sexual Activity  . Alcohol use: No    Alcohol/week: 0.0 standard drinks  . Drug use: No  . Sexual activity: Not  Currently    Birth control/protection: Post-menopausal  Other Topics Concern  . Not on file  Social History Narrative   Exercises on [employer] gym elliptical   Social Determinants of Health   Financial Resource Strain: Low Risk   . Difficulty of Paying Living Expenses: Not hard at all  Food Insecurity: No Food Insecurity  . Worried About Charity fundraiser in the Last Year: Never true  . Ran Out of Food in the Last Year: Never true  Transportation Needs: No Transportation Needs  . Lack of Transportation (Medical): No  . Lack of Transportation (Non-Medical): No  Physical Activity: Sufficiently Active  . Days of Exercise per Week: 7 days  . Minutes of Exercise per Session: 60 min  Stress: No Stress Concern Present  . Feeling of Stress : Not at all  Social Connections:   . Frequency of Communication with Friends and Family:   . Frequency of Social Gatherings with Friends and Family:   . Attends Religious Services:   . Active Member of Clubs or Organizations:   . Attends Archivist Meetings:   Marland Kitchen Marital Status:     Tobacco Counseling Counseling given: Not Answered   Clinical Intake:  Pre-visit preparation completed: Yes  Pain : No/denies pain     Nutritional Status: BMI 25 -29 Overweight Nutritional Risks: None Diabetes: No  How often do  you need to have someone help you when you read instructions, pamphlets, or other written materials from your doctor or pharmacy?: 1 - Never What is the last grade level you completed in school?: some college  Diabetic: No Nutrition Risk Assessment:  Has the patient had any N/V/D within the last 2 months?  No  Does the patient have any non-healing wounds?  No  Has the patient had any unintentional weight loss or weight gain?  No   Diabetes:  Is the patient diabetic?  No  If diabetic, was a CBG obtained today?  No  Did the patient bring in their glucometer from home?  No  How often do you monitor your CBG's? N/A.   Financial Strains and Diabetes Management:  Are you having any financial strains with the device, your supplies or your medication? No .  Does the patient want to be seen by Chronic Care Management for management of their diabetes?  No  Would the patient like to be referred to a Nutritionist or for Diabetic Management?  No      Interpreter Needed?: No  Information entered by :: CJohnson, LPN   Activities of Daily Living In your present state of health, do you have any difficulty performing the following activities: 06/22/2020  Hearing? Y  Comment left ear deaf  Vision? N  Difficulty concentrating or making decisions? N  Walking or climbing stairs? N  Dressing or bathing? N  Doing errands, shopping? N  Preparing Food and eating ? N  Using the Toilet? N  In the past six months, have you accidently leaked urine? N  Do you have problems with loss of bowel control? N  Managing your Medications? N  Managing your Finances? N  Housekeeping or managing your Housekeeping? N  Some recent data might be hidden    Patient Care Team: Tower, Wynelle Fanny, MD as PCP - General (Family Medicine) Magrinat, Virgie Dad, MD as Consulting Physician (Oncology) Dian Queen, MD as Consulting Physician (Obstetrics and Gynecology) Eula Flax, OD as Referring Physician  (Ophthalmology) Clyde Canterbury, MD as Referring Physician (Otolaryngology) Oneta Rack,  MD as Consulting Physician (Dermatology) Ocie Cornfield, DDS as Consulting Physician (Dentistry) Rockey Situ, Kathlene November, MD as Consulting Physician (Cardiology)  Indicate any recent Medical Services you may have received from other than [facility] providers in the past year (date may be approximate).     Assessment:   This is a routine wellness examination for [place].  Hearing/Vision screen  Hearing Screening   125Hz  250Hz  500Hz  1000Hz  2000Hz  3000Hz  4000Hz  6000Hz  8000Hz   Right ear:           Left ear:           Vision Screening Comments: Patient gets annual eye exams  Dietary issues and exercise activities discussed: Current Exercise Habits: Home exercise routine, Type of exercise: walking, Time (Minutes): 60, Frequency (Times/Week): 7, Weekly Exercise (Minutes/Week): 420, Intensity: Moderate, Exercise limited by: None identified  Goals    . Increase physical activity     Starting 05/20/2017, I will continue to exercise 15 min every other day.     . Patient Stated     Starting 05/26/2018, I will continue to take medications as prescribed.     . Patient Stated     06/22/2020, I will continue to walk everyday for 1 hour.       Depression Screen PHQ 2/9 Scores 06/22/2020 06/16/2019 05/26/2018 05/20/2017 05/11/2016 05/13/2015 05/11/2014  PHQ - 2 Score 0 0 0 0 0 0 0  PHQ- 9 Score 0 - 0 - - - -    Fall Risk Fall Risk  06/22/2020 10/28/2019 06/16/2019 05/26/2018 05/20/2017  Falls in the past year? 0 0 0 No No  Comment - Emmi Telephone Survey: data to providers prior to load - - -  Number falls in past yr: 0 - - - -  Injury with Fall? 0 - - - -  Risk for fall due to : Medication side effect - - - -  Follow up Falls evaluation completed;Falls prevention discussed - - - -    Any stairs in or around the home? Yes  If so, are there any without handrails? No  Home free of loose throw rugs in walkways, pet beds,  electrical cords, etc? Yes  Adequate lighting in your home to reduce risk of falls? Yes   ASSISTIVE DEVICES UTILIZED TO PREVENT FALLS:  Life alert? No  Use of a cane, walker or w/c? No  Grab bars in the bathroom? No  Shower chair or bench in shower? No  Elevated toilet seat or a handicapped toilet? No   TIMED UP AND GO:  Was the test performed? N/A, telephonic visit .    Cognitive Function: MMSE - Mini Mental State Exam 06/22/2020 05/26/2018 05/20/2017 05/11/2016  Not completed: Refused - - -  Orientation to time - 5 5 5   Orientation to Place - 5 5 5   Registration - 3 3 3   Attention/ Calculation - 0 0 0  Recall - 3 3 3   Language- name 2 objects - 0 0 0  Language- repeat - 1 1 1   Language- follow 3 step command - 3 3 3   Language- read & follow direction - 0 0 0  Write a sentence - 0 0 0  Copy design - 0 0 0  Total score - 20 20 20   Mini Cog  Mini-Cog screen was not completed. Patient wanted to skip this. Maximum score is 22. A value of 0 denotes this part of the MMSE was not completed or the patient failed this part of the Mini-Cog screening.  Immunizations Immunization History  Administered Date(s) Administered  . Fluad Quad(high Dose 65+) 08/27/2019  . Influenza Split 09/17/2011  . Influenza Whole 10/17/2007, 09/27/2008, 09/23/2009  . Influenza, High Dose Seasonal PF 09/19/2015, 08/26/2017  . Influenza-Unspecified 09/01/2013, 10/19/2014, 08/24/2016, 10/21/2018  . PFIZER SARS-COV-2 Vaccination 12/29/2019, 01/19/2020  . Pneumococcal Conjugate-13 05/13/2015  . Pneumococcal Polysaccharide-23 12/16/2006, 05/11/2014  . Td 12/03/2005  . Tdap 12/10/2017  . Zoster 12/03/2007  . Zoster Recombinat (Shingrix) 05/21/2018, 08/12/2018    TDAP status: Up to date Flu Vaccine status: Up to date Pneumococcal vaccine status: Up to date Covid-19 vaccine status: Completed vaccines  Qualifies for Shingles Vaccine? Yes   Zostavax completed Yes   Shingrix Completed?:  Yes  Screening Tests Health Maintenance  Topic Date Due  . MAMMOGRAM  01/17/2020  . Hepatitis C Screening  06/20/2030 (Originally 01-Jan-1941)  . INFLUENZA VACCINE  07/03/2020  . TETANUS/TDAP  12/11/2027  . DEXA SCAN  Completed  . COVID-19 Vaccine  Completed  . PNA vac Low Risk Adult  Completed  . PAP SMEAR-Modifier  Discontinued  . URINE MICROALBUMIN  Discontinued    Health Maintenance  Health Maintenance Due  Topic Date Due  . MAMMOGRAM  01/17/2020    Colorectal cancer screening: Completed 08/27/2018.  no longer required  Mammogram status: Completed 07/17/2019. Repeat every year Bone Density status: Completed 07/09/2018. Results reflect: Bone density results: OSTEOPOROSIS. Repeat every 2 years.  Lung Cancer Screening: (Low Dose CT Chest recommended if Age 30-80 years, 30 pack-year currently smoking OR have quit w/in 15years.) does not qualify.     Additional Screening:  Hepatitis C Screening: does qualify; Completed declined  Vision Screening: Recommended annual ophthalmology exams for early detection of glaucoma and other disorders of the eye. Is the patient up to date with their annual eye exam?  Yes  Who is the provider or what is the name of the office in which the patient attends annual eye exams? Dr. Chong Sicilian  If pt is not established with a provider, would they like to be referred to a provider to establish care? No .   Dental Screening: Recommended annual dental exams for proper oral hygiene  Community Resource Referral / Chronic Care Management: CRR required this visit?  No   CCM required this visit?  No      Plan:     I have personally reviewed and noted the following in the patient's chart:   . Medical and social history . Use of alcohol, tobacco or illicit drugs  . Current medications and supplements . Functional ability and status . Nutritional status . Physical activity . Advanced directives . List of other physicians . Hospitalizations, surgeries,  and ER visits in previous 12 months . Vitals . Screenings to include cognitive, depression, and falls . Referrals and appointments  In addition, I have reviewed and discussed with patient certain preventive protocols, quality metrics, and best practice recommendations. A written personalized care plan for preventive services as well as general preventive health recommendations were provided to patient.   Due to this being a telephonic visit, the after visit summary with patients personalized plan was offered to patient via mail or my-chart. Patient preferred to pick up at office at next visit.   Andrez Grime, LPN   [id number]/99/[id number]

## 2020-07-18 ENCOUNTER — Ambulatory Visit
Admission: RE | Admit: 2020-07-18 | Discharge: 2020-07-18 | Disposition: A | Discharge status: Home / Self Care | Payer: PPO | Source: Ambulatory Visit | Attending: Family Medicine | Admitting: Family Medicine

## 2020-07-18 ENCOUNTER — Other Ambulatory Visit: Payer: Self-pay

## 2020-07-18 ENCOUNTER — Ambulatory Visit
Admission: RE | Admit: 2020-07-18 | Discharge: 2020-07-18 | Disposition: A | Discharge status: Home / Self Care | Payer: PPO | Source: Ambulatory Visit | Attending: Oncology | Admitting: Oncology

## 2020-07-18 DIAGNOSIS — M8589 Other specified disorders of bone density and structure, multiple sites: Secondary | ICD-10-CM | POA: Diagnosis not present

## 2020-07-18 DIAGNOSIS — Z1231 Encounter for screening mammogram for malignant neoplasm of breast: Secondary | ICD-10-CM

## 2020-07-18 DIAGNOSIS — C50411 Malignant neoplasm of upper-outer quadrant of right female breast: Secondary | ICD-10-CM

## 2020-07-18 DIAGNOSIS — M818 Other osteoporosis without current pathological fracture: Secondary | ICD-10-CM

## 2020-07-18 DIAGNOSIS — Z780 Asymptomatic menopausal state: Secondary | ICD-10-CM | POA: Diagnosis not present

## 2020-07-19 ENCOUNTER — Telehealth: Payer: Self-pay

## 2020-07-19 NOTE — Telephone Encounter (Signed)
Patient notified of bone density results, verbalized understanding.

## 2020-08-02 DIAGNOSIS — Z1152 Encounter for screening for COVID-19: Secondary | ICD-10-CM | POA: Diagnosis not present

## 2020-08-02 DIAGNOSIS — Z03818 Encounter for observation for suspected exposure to other biological agents ruled out: Secondary | ICD-10-CM | POA: Diagnosis not present

## 2020-08-12 ENCOUNTER — Other Ambulatory Visit: Payer: Self-pay | Admitting: *Deleted

## 2020-08-12 DIAGNOSIS — C50411 Malignant neoplasm of upper-outer quadrant of right female breast: Secondary | ICD-10-CM

## 2020-08-14 NOTE — Progress Notes (Signed)
[place]  [phone] Fax:(336) [phone]     ID: Gabrielle Webb DOB: 1941/04/24  MR#: [id number]  IEP#:[id number]  Patient Care Team: Abner Greenspan, MD as PCP - General (Family Medicine) Elo Marmolejos, Virgie Dad, MD as Consulting Physician (Oncology) Dian Queen, MD as Consulting Physician (Obstetrics and Gynecology) Eula Flax, OD as Referring Physician (Ophthalmology) Clyde Canterbury, MD as Referring Physician (Otolaryngology) Oneta Rack, MD as Consulting Physician (Dermatology) Ocie Cornfield, DDS as Consulting Physician (Dentistry) Rockey Situ Kathlene November, MD as Consulting Physician (Cardiology)  OTHER MD: Deitra Mayo MD  CHIEF COMPLAINT: Estrogen receptor positive breast cancer  CURRENT TREATMENT: Observation; denosumab/Prolia   INTERVAL HISTORY: Gabrielle Webb returns today for follow-up of her history of estrogen receptor positive breast cancer. She continues under observation.   She started Prolia in November 2019.  Her most recent dose was 05/03/2020.  She tolerates this with no side effects that she is aware of.  She goes to her dentist twice a year regularly.  In June 2021 she palpated a little BB in her right breast.  She underwent biopsy of this 05/27/2020 which showed (SAA 21-04/06/2001) fat necrosis).  07/18/2020 she [facility] showing a T score of -2.4.  This is improved from prior.  Bilateral mammography with tomography at the [facility] 07/18/2020 showed the [facility] to be category C.  There were no suspicious findings.   REVIEW OF SYSTEMS: Gabrielle Webb had the [employer] vaccine x2.  Her mother is in a nursing home and she is very careful to stay away from people except as needed.  She is a Tourist information centre manager at [employer].  She is planning to get the booster shot.  She exercises by walking 45 to 60 minutes most days and she also does aerobics.  A detailed review of systems was otherwise stable.   BREAST CANCER  HISTORY: From Dr. Eusebio Me initial intake note 05/10/2011:  "Gabrielle Webb is a pleasant 79 year old female.  She underwent a screening mammogram on 04/20/2011 and was found to have an asymmetric spiculated mass in the upper outer quadrant.  Ultrasound confirmed the presence of this mass, and a biopsy on 05/02/2011 revealed a low-grade invasive ductal carcinoma.  This measured 11 mm and was ER, PR positive, HER2 negative and Ki-67 was 15%.  An MRI of the bilateral breasts was performed on 05/07/2011.  This showed a 1.5 x 1.0 cm mass.  Gabrielle Webb notes a single episode of possible stinging in her right breast prior to her mammogram.  She has a long history of fibrocystic disease though, so she did not feel anything was greatly out of the ordinary.  Interestingly, she did have a history of several breast biopsies and excisions in 1987 and 1988.  She brings these old clinic records with her, and it looks like some fibrocystic disease was removed as well as an area of atypical cells.  The differential is quite broad, but it is possible given that she has developed this in a very similar area that this was LCIS.  Regardless, at that time when she had those biopsies, she had been on hormone replacement therapy for about a month and took herself off."  Her subsequent history is as detailed below   PAST MEDICAL HISTORY: Past Medical History:  Diagnosis Date  . Allergy   . Anxiety   . Breast cancer ([place]) 7/12   Right  . Cellulitis   . Diverticulosis of colon   . GERD (gastroesophageal reflux disease)   .  Headache    sinus  . History of hiatal hernia   . Hx of radiation therapy 06/20/11 - 07/11/11   right breast  . Hypothyroid   . Osteoporosis   . Personal history of radiation therapy 2012   Right Breast Cancer  . PONV (postoperative nausea and vomiting)   . Skin cancer    basal and squamous cell  . Vertigo     PAST SURGICAL HISTORY: Past Surgical History:  Procedure Laterality Date  .  ABDOMINAL HYSTERECTOMY N/A 11/08/2016   Procedure: HYSTERECTOMY TOTAL  ABDOMINAL;  Surgeon: Dian Queen, MD;  Location: [facility];  Service: Gynecology;  Laterality: N/A;  . APPENDECTOMY  07/2007  . back sugery  1970  . BREAST BIOPSY Left    Benign  . BREAST EXCISIONAL BIOPSY Left 1983  . BREAST EXCISIONAL BIOPSY Left 1983  . BREAST LUMPECTOMY Right 05/16/2011  . COLONOSCOPY    . COLONOSCOPY WITH PROPOFOL N/A 08/27/2018   Procedure: COLONOSCOPY WITH PROPOFOL;  Surgeon: Manya Silvas, MD;  Location: [facility];  Service: Endoscopy;  Laterality: N/A;  . DILATION AND CURETTAGE OF UTERUS N/A 05/31/2014   Procedure: DILATATION AND CURETTAGE WITH ULTRASOUND GUIDANCE;  Surgeon: Cyril Mourning, MD;  Location: [facility];  Service: Gynecology;  Laterality: N/A;  . [place]  . right eye surgery    . SALPINGOOPHORECTOMY Bilateral 11/08/2016   Procedure: BILATERAL SALPINGO OOPHORECTOMY;  Surgeon: Dian Queen, MD;  Location: [facility];  Service: Gynecology;  Laterality: Bilateral;  . UPPER GI ENDOSCOPY      FAMILY HISTORY Family History  Problem Relation Age of Onset  . Heart attack Father 62  . Hypertension Father   . Atrial fibrillation Mother   . Coronary artery disease Mother   . Diabetes Other        Grandmother  . Coronary artery disease Other        Grandmother  . Uterine cancer Other        Grandmother  . Leukemia Other        Aunt  . Cancer Maternal Aunt        leukemia  . Cancer Maternal Uncle        colon   the patient's father died at the age of 34 from a myocardial infarction. The patient's mother is 12 years old currently. She has atrial fibrillation. She lives at [facility]. The patient is an only child. There is no history of breast or ovarian cancer in the family to her knowledge   GYNECOLOGIC HISTORY:  Patient's last menstrual period was 12/03/1988. Menarche age 68 first live birth age 72. She stopped having periods in her late 70s. 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 Her husband died from widely metastatic cancer of unknown primary (it was never biopsied). She lives alone in a [place], with 2 cats. Her son [name] "[name] "Careers adviser lives in [place]. He works out of his home for [employer]. The patient has 3 grandchildren, the oldest one is micro-cephalic the other 2 are planning to work in air conditioning and heating and as physical therapist..,  The patient attends a local church in [place]: In place. The patient's son [name] "[name] " [name] is her healthcare power of attorney. He can be reached at [phone].   HEALTH MAINTENANCE: Social History   Tobacco Use  .  Smoking status: Never Smoker  . Smokeless tobacco: Never Used  Vaping Use  . Vaping Use: Never used  Substance Use Topics  . Alcohol use: No    Alcohol/week: 0.0 standard drinks  . Drug use: No     Colonoscopy: Sept 2019. [place]  PAP:  Bone density: 07/2020, -2.4   Allergies  Allergen Reactions  . Codeine Nausea And Vomiting  . Fentanyl Nausea And Vomiting  . Hydromorphone Nausea And Vomiting       . Morphine Nausea And Vomiting  . Tape     Surgical tape  . Prednisone Palpitations    Rapid hear beat    Current Outpatient Medications  Medication Sig Dispense Refill  . aspirin 81 MG tablet Take 81 mg by mouth daily.     Marland Kitchen atorvastatin (LIPITOR) 10 MG tablet TAKE ONE TABLET BY MOUTH EVERY DAY 90 tablet 3  . BIOTIN PO Take by mouth daily.    Marland Kitchen CALCIUM PO Take 1,500 mg by mouth daily.    . chlorthalidone (HYGROTON) 50 MG tablet TAKE 1 TABLET BY MOUTH DAILY AS NEEDED FOR SWELLING 90 tablet 2  . Cholecalciferol (VITAMIN D) 2000 UNITS CAPS Take 1 capsule by mouth daily.    . Cyanocobalamin (B-12 PO) Take 2,500 mcg by mouth daily.    Marland Kitchen denosumab (PROLIA) 60 MG/ML SOSY injection Inject 60 mg into the skin  every 6 (six) months. 1 mL 0  . diclofenac sodium (VOLTAREN) 1 % GEL Apply 2 g topically 4 (four) times daily. Rub into affected area of foot 2 to 4 times daily (Patient taking differently: Apply 2 g topically as needed. Rub into affected area of foot 2 to 4 times daily) 100 g 2  . dicyclomine (BENTYL) 10 MG capsule TAKE 1 CAPSULE BY MOUTH TWICE DAILY FOR SPASMS 60 capsule 5  . famotidine (PEPCID) 20 MG tablet TAKE ONE TABLET EVERY DAY AS NEEDED FOR HEARTBURN OR INDIGESTION 90 tablet 1  . fluticasone (FLONASE) 50 MCG/ACT nasal spray TWO SPRAYS IN EACH NOSTRIL EVERY DAY 18 g 11  . levothyroxine (SYNTHROID) 100 MCG tablet TAKE ONE TABLET EVERY DAY 90 tablet 3  . LUTEIN PO Take 6 mg by mouth daily.    . Multiple Vitamin (MULTIVITAMIN) capsule Take 1 capsule by mouth daily.      . Naproxen Sodium (ALEVE PO) Take 1 tablet by mouth as needed.    . niacin 500 MG tablet Take 1,000 mg by mouth at bedtime.     Marland Kitchen OVER THE COUNTER MEDICATION Take 1 tablet by mouth daily as needed (allergies). Over the counter allergy medication     No current facility-administered medications for this visit.    OBJECTIVE: White woman who appears well  Vitals:   08/15/20 0837  BP: 128/75  Pulse: 72  Resp: 16  Temp: (!) 97 F (36.1 C)  SpO2: 100%     Body mass index is 24.73 kg/m.    ECOG FS:0 - Asymptomatic  Sclerae unicteric, EOMs intact Wearing a mask No cervical or supraclavicular adenopathy Lungs no rales or rhonchi Heart regular rate and rhythm Abd soft, nontender, positive bowel sounds MSK no focal spinal tenderness, no upper extremity lymphedema Neuro: nonfocal, well oriented, appropriate affect Breasts: In the right breast laterally there is an area of induration associated with the recent biopsy.  There are no other findings of concern.  Left breast is benign.  Both axillae are benign.   LAB RESULTS:  CMP     Component Value  Date/Time   NA 141 06/13/2020 0805   NA 141 06/13/2020 0805   NA  142 07/03/2016 0957   K 3.9 06/13/2020 0805   K 3.9 06/13/2020 0805   K 4.1 07/03/2016 0957   CL 104 06/13/2020 0805   CL 104 06/13/2020 0805   CL 109 (H) 09/18/2012 0849   CO2 29 06/13/2020 0805   CO2 29 06/13/2020 0805   CO2 27 07/03/2016 0957   GLUCOSE 87 06/13/2020 0805   GLUCOSE 87 06/13/2020 0805   GLUCOSE 86 07/03/2016 0957   GLUCOSE 79 09/18/2012 0849   BUN 13 06/13/2020 0805   BUN 13 06/13/2020 0805   BUN 11.8 07/03/2016 0957   CREATININE 0.78 06/13/2020 0805   CREATININE 0.78 06/13/2020 0805   CREATININE 0.81 08/13/2019 0846   CREATININE 0.7 07/03/2016 0957   CALCIUM 9.6 06/13/2020 0805   CALCIUM 9.6 06/13/2020 0805   CALCIUM 9.4 07/03/2016 0957   PROT 6.6 06/13/2020 0805   PROT 6.7 07/03/2016 0957   ALBUMIN 4.6 06/13/2020 0805   ALBUMIN 3.8 07/03/2016 0957   AST 18 06/13/2020 0805   AST 20 08/13/2019 0846   AST 19 07/03/2016 0957   ALT 10 06/13/2020 0805   ALT 12 08/13/2019 0846   ALT 12 07/03/2016 0957   ALKPHOS 50 06/13/2020 0805   ALKPHOS 55 07/03/2016 0957   BILITOT 0.9 06/13/2020 0805   BILITOT 0.5 08/13/2019 0846   BILITOT 0.57 07/03/2016 0957   GFRNONAA >60 08/13/2019 0846   GFRAA >60 08/13/2019 0846    INo results found for: SPEP, UPEP  Lab Results  Component Value Date   WBC 7.0 08/15/2020   NEUTROABS 3.6 08/15/2020   HGB 13.4 08/15/2020   HCT 40.5 08/15/2020   MCV 90.2 08/15/2020   PLT 157 08/15/2020      Chemistry      Component Value Date/Time   NA 141 06/13/2020 0805   NA 141 06/13/2020 0805   NA 142 07/03/2016 0957   K 3.9 06/13/2020 0805   K 3.9 06/13/2020 0805   K 4.1 07/03/2016 0957   CL 104 06/13/2020 0805   CL 104 06/13/2020 0805   CL 109 (H) 09/18/2012 0849   CO2 29 06/13/2020 0805   CO2 29 06/13/2020 0805   CO2 27 07/03/2016 0957   BUN 13 06/13/2020 0805   BUN 13 06/13/2020 0805   BUN 11.8 07/03/2016 0957   CREATININE 0.78 06/13/2020 0805   CREATININE 0.78 06/13/2020 0805   CREATININE 0.81 08/13/2019 0846    CREATININE 0.7 07/03/2016 0957      Component Value Date/Time   CALCIUM 9.6 06/13/2020 0805   CALCIUM 9.6 06/13/2020 0805   CALCIUM 9.4 07/03/2016 0957   ALKPHOS 50 06/13/2020 0805   ALKPHOS 55 07/03/2016 0957   AST 18 06/13/2020 0805   AST 20 08/13/2019 0846   AST 19 07/03/2016 0957   ALT 10 06/13/2020 0805   ALT 12 08/13/2019 0846   ALT 12 07/03/2016 0957   BILITOT 0.9 06/13/2020 0805   BILITOT 0.5 08/13/2019 0846   BILITOT 0.57 07/03/2016 0957       Lab Results  Component Value Date   LABCA2 17 05/09/2011    No components found for: XIPJA250  No results for input(s): INR in the last 168 hours.  Urinalysis    Component Value Date/Time   BILIRUBINUR 1+ 06/25/2014 1208   PROTEINUR 30+ 06/25/2014 1208   UROBILINOGEN 0.2 06/25/2014 1208   NITRITE neg. 06/25/2014 1208   LEUKOCYTESUR  moderate (2+) 06/25/2014 1208    STUDIES: DG Bone Density  Result Date: 07/18/2020 EXAM: DUAL X-RAY ABSORPTIOMETRY (DXA) FOR BONE MINERAL DENSITY IMPRESSION: Referring Physician:  Chauncey Cruel Your patient completed a BMD test using Lunar IDXA DXA system ( analysis version: 16 ) manufactured by [employer]. Technologist: AW PATIENT: Name: Drinda, [place] Patient ID: [id number] Birth Date: [birth year] Height: 63.0 in. Sex: Female Measured: 07/18/2020 Weight: 144.6 lbs. Indications: Advanced Age, Bilateral Ovariectomy (65.51), Breast Cancer History, Caucasian, Estrogen Deficient, Family History of Osteoporosis, History of Osteopenia, Hypothyroid, Hysterectomy, Levothyroxine, Postmenopausal Fractures: None Treatments: Calcium (E943.0), Prolia, Vitamin D (E933.5) ASSESSMENT: The BMD measured at Femur Neck Left is 0.711 g/cm2 with a T-score of -2.4. This patient is considered osteopenic according to [facility]) criteria. The scan quality is good. L-4 was excluded due to degenerative changes. Patient does not meet criteria for FRAX due to treatment on prolia. Site Region  Measured Date Measured Age YA BMD Significant CHANGE T-score AP Spine L1-L3 07/18/2020 77.1 -2.2 0.912 g/cm2 * AP Spine L1-L3 07/09/2018 75.1 -2.10 0.841 g/cm2 * DualFemur Neck Left 07/18/2020 77.1 -2.4 0.711 g/cm2 * DualFemur Neck Left  07/09/2018    75.1         -2.6    0.671 g/cm2 DualFemur Total Mean 07/18/2020 77.1 -2.2 0.732 g/cm2 * DualFemur Total Mean 07/09/2018    75.1         -2.5    0.694 g/cm2 World Health Organization [facility]) criteria for post-menopausal, Caucasian Women: Normal       T-score at or above -1 SD Osteopenia   T-score between -1 and -2.5 SD Osteoporosis T-score at or below -2.5 SD RECOMMENDATION: 1. All patients should optimize calcium and vitamin D intake. 2. Consider FDA approved medical therapies in postmenopausal women and men aged 73 years and older, based on the following: a. A hip or vertebral (clinical or morphometric) fracture b. T- score < or = -2.5 at the femoral neck or spine after appropriate evaluation to exclude secondary causes c. Low bone mass (T-score between -1.0 and -2.5 at the femoral neck or spine) and a 10 year probability of a hip fracture > or = 3% or a 10 year probability of a major osteoporosis-related fracture > or = 20% based on the [place]-adapted WHO algorithm d. Clinician judgment and/or patient preferences may indicate treatment for people with 10-year fracture probabilities above or below these levels FOLLOW-UP: Patients with diagnosis of osteoporosis or at high risk for fracture should have regular bone mineral density tests. For patients eligible for Medicare, routine testing is allowed once every 2 years. The testing frequency can be increased to one year for patients who have rapidly progressing disease, those who are receiving or discontinuing medical therapy to restore bone mass, or have additional risk factors. I have reviewed this report and agree with the above findings. [facility] Radiology Electronically Signed   By: Lowella Grip III M.D.   On:  07/18/2020 09:08   MM 3D SCREEN BREAST BILATERAL  Result Date: 07/26/2020 CLINICAL DATA:  Screening. RIGHT lumpectomy with radiation treatment 2012. EXAM: DIGITAL SCREENING BILATERAL MAMMOGRAM WITH TOMO AND CAD COMPARISON:  Previous exam(s). ACR Breast Density Category c: The breast tissue is heterogeneously dense, which may obscure small masses. FINDINGS: There are no findings suspicious for malignancy. Images were processed with CAD. IMPRESSION: No mammographic evidence of malignancy. A result letter of this screening mammogram will be mailed directly to the patient. RECOMMENDATION: Screening mammogram in one year. (  Code:[id number]-B-01Y) BI-RADS CATEGORY  1: Negative. Electronically Signed   By: Nolon Nations M.D.   On: 07/26/2020 11:46    ASSESSMENT: 79 y.o.  [place] woman status post right breast upper outer quadrant biopsy 05/02/2011 for a low-grade invasive ductal carcinoma which was estrogen receptor 100% positive, progesterone receptor 22% positive, with an MIB-1 of 17% and no HER-2 amplification, the signals ratio being 1.68  ([id number])  2. Status post right lumpectomy and sentinel lymph node sampling 05/16/2011 for a pT1c pN0, stage IA invasive ductal carcinoma, low-grade, with negative margins  3. Oncotype DX score of 13 predicted an 8% risk of recurrence outside the breast within 10 years if the patient's only systemic therapy is tamoxifen for 5 years. He also predicted no benefit from chemotherapy.  3. Adjuvant radiation completed 07/23/2011  4. Tamoxifen started August 2012, discontinued June 2015  5. Letrozole started August 2015, discontinued August 2017  (a) alendronate started August 2015-- switched to denosumab/prolia November 2019.  (b) [facility] 12/01/2015 shows osteoporosis with a T score of -2.5  (c) bone density at the [facility] 07/09/2018 showed a T score of -2.8 osteoporosis  (d) bone density at [facility] 07/18/2020 showed a T  score of -2.4, osteopenia  6. S/p TAH-BSO December 2017, with benign pathology.   PLAN: Gabrielle Webb is now a little over 9 years out from definitive surgery for her breast cancer with no evidence of disease recurrence.  This is very favorable.  She is benefiting from the denosumab/Prolia, and is now osteopenic whereas previously she was osteoporotic.  We discussed this and she will receive Prolia at least 1 more year.  I have entered those orders.  She has a my risk genetics testing through her gynecologist and that was negative.  This is separately scanned.  She will see me again next year.  She will be 10 years out at that time.  She may consider discontinuation breast cancer follow-up then.  Total encounter time 25 minutes.*  Abdimalik Mayorquin, Virgie Dad, MD  08/15/20 9:00 AM Medical Oncology and Hematology [facility] [place], [place] [address] Tel. [phone]    Fax. [phone]   I, Wilburn Mylar, am acting as scribe for Dr. Virgie Dad. Blain Hunsucker.  I, Lurline Del MD, have reviewed the above documentation for accuracy and completeness, and I agree with the above.   *Total Encounter Time as defined by the Centers for Medicare and Medicaid Services includes, in addition to the face-to-face time of a patient visit (documented in the note above) non-face-to-face time: obtaining and reviewing outside history, ordering and reviewing medications, tests or procedures, care coordination (communications with other health care professionals or caregivers) and documentation in the medical record.

## 2020-08-15 ENCOUNTER — Telehealth: Payer: Self-pay | Admitting: Oncology

## 2020-08-15 ENCOUNTER — Other Ambulatory Visit: Payer: Self-pay

## 2020-08-15 ENCOUNTER — Inpatient Hospital Stay: Payer: PPO | Attending: Oncology | Admitting: Oncology

## 2020-08-15 ENCOUNTER — Inpatient Hospital Stay: Payer: PPO

## 2020-08-15 VITALS — BP 128/75 | HR 72 | Temp 97.0°F | Resp 16 | Ht 63.0 in | Wt 139.6 lb

## 2020-08-15 DIAGNOSIS — Z90722 Acquired absence of ovaries, bilateral: Secondary | ICD-10-CM | POA: Diagnosis not present

## 2020-08-15 DIAGNOSIS — Z9079 Acquired absence of other genital organ(s): Secondary | ICD-10-CM | POA: Diagnosis not present

## 2020-08-15 DIAGNOSIS — Z923 Personal history of irradiation: Secondary | ICD-10-CM | POA: Insufficient documentation

## 2020-08-15 DIAGNOSIS — Z800 Family history of malignant neoplasm of digestive organs: Secondary | ICD-10-CM | POA: Insufficient documentation

## 2020-08-15 DIAGNOSIS — Z79899 Other long term (current) drug therapy: Secondary | ICD-10-CM | POA: Insufficient documentation

## 2020-08-15 DIAGNOSIS — Z170 Estrogen receptor positive status [ER+]: Secondary | ICD-10-CM | POA: Diagnosis not present

## 2020-08-15 DIAGNOSIS — M810 Age-related osteoporosis without current pathological fracture: Secondary | ICD-10-CM | POA: Insufficient documentation

## 2020-08-15 DIAGNOSIS — C50411 Malignant neoplasm of upper-outer quadrant of right female breast: Secondary | ICD-10-CM

## 2020-08-15 DIAGNOSIS — Z8049 Family history of malignant neoplasm of other genital organs: Secondary | ICD-10-CM | POA: Diagnosis not present

## 2020-08-15 DIAGNOSIS — Z90710 Acquired absence of both cervix and uterus: Secondary | ICD-10-CM | POA: Insufficient documentation

## 2020-08-15 DIAGNOSIS — Z806 Family history of leukemia: Secondary | ICD-10-CM | POA: Insufficient documentation

## 2020-08-15 LAB — CBC WITH DIFFERENTIAL (CANCER CENTER ONLY)
Abs Immature Granulocytes: 0.03 10*3/uL (ref 0.00–0.07)
Basophils Absolute: 0 10*3/uL (ref 0.0–0.1)
Basophils Relative: 0 %
Eosinophils Absolute: 0.1 10*3/uL (ref 0.0–0.5)
Eosinophils Relative: 1 %
HCT: 40.5 % (ref 36.0–46.0)
Hemoglobin: 13.4 g/dL (ref 12.0–15.0)
Immature Granulocytes: 0 %
Lymphocytes Relative: 36 %
Lymphs Abs: 2.5 10*3/uL (ref 0.7–4.0)
MCH: 29.8 pg (ref 26.0–34.0)
MCHC: 33.1 g/dL (ref 30.0–36.0)
MCV: 90.2 fL (ref 80.0–100.0)
Monocytes Absolute: 0.7 10*3/uL (ref 0.1–1.0)
Monocytes Relative: 10 %
Neutro Abs: 3.6 10*3/uL (ref 1.7–7.7)
Neutrophils Relative %: 53 %
Platelet Count: 157 10*3/uL (ref 150–400)
RBC: 4.49 MIL/uL (ref 3.87–5.11)
RDW: 12.8 % (ref 11.5–15.5)
WBC Count: 7 10*3/uL (ref 4.0–10.5)
nRBC: 0 % (ref 0.0–0.2)

## 2020-08-15 LAB — CMP (CANCER CENTER ONLY)
ALT: 12 U/L (ref 0–44)
AST: 21 U/L (ref 15–41)
Albumin: 4 g/dL (ref 3.5–5.0)
Alkaline Phosphatase: 53 U/L (ref 38–126)
Anion gap: 5 (ref 5–15)
BUN: 12 mg/dL (ref 8–23)
CO2: 30 mmol/L (ref 22–32)
Calcium: 9.6 mg/dL (ref 8.9–10.3)
Chloride: 106 mmol/L (ref 98–111)
Creatinine: 0.74 mg/dL (ref 0.44–1.00)
GFR, Est AFR Am: >60 mL/min (ref >60)
GFR, Estimated: >60 mL/min (ref >60)
Glucose, Bld: 80 mg/dL (ref 70–99)
Potassium: 3.6 mmol/L (ref 3.5–5.1)
Sodium: 141 mmol/L (ref 135–145)
Total Bilirubin: 0.5 mg/dL (ref 0.3–1.2)
Total Protein: 7 g/dL (ref 6.5–8.1)

## 2020-08-15 NOTE — Telephone Encounter (Signed)
Scheduled appts per 9/13 los. Gave pt a print out of AVS.

## 2020-08-22 ENCOUNTER — Telehealth: Payer: Self-pay | Admitting: Family Medicine

## 2020-08-22 NOTE — Telephone Encounter (Signed)
It looks like a flu shot and if consensus is reached on covid booster later I would recommend it  Other vaccines utd

## 2020-08-22 NOTE — Telephone Encounter (Signed)
Sent mychart message letting pt know 

## 2020-08-22 NOTE — Telephone Encounter (Signed)
Patient called. She wants to know what immunizations she's due to get this year. She said the response can be sent to her through my chart.

## 2020-09-27 ENCOUNTER — Other Ambulatory Visit: Payer: Self-pay | Admitting: Family Medicine

## 2020-10-16 DIAGNOSIS — N390 Urinary tract infection, site not specified: Secondary | ICD-10-CM | POA: Diagnosis not present

## 2020-10-16 DIAGNOSIS — R300 Dysuria: Secondary | ICD-10-CM | POA: Diagnosis not present

## 2020-10-25 DIAGNOSIS — M79671 Pain in right foot: Secondary | ICD-10-CM | POA: Diagnosis not present

## 2020-10-31 DIAGNOSIS — N390 Urinary tract infection, site not specified: Secondary | ICD-10-CM | POA: Diagnosis not present

## 2020-11-02 ENCOUNTER — Ambulatory Visit (INDEPENDENT_AMBULATORY_CARE_PROVIDER_SITE_OTHER): Payer: PPO | Admitting: Family Medicine

## 2020-11-02 ENCOUNTER — Telehealth: Payer: Self-pay

## 2020-11-02 ENCOUNTER — Encounter: Payer: Self-pay | Admitting: Family Medicine

## 2020-11-02 ENCOUNTER — Other Ambulatory Visit: Payer: Self-pay

## 2020-11-02 DIAGNOSIS — R310 Gross hematuria: Secondary | ICD-10-CM | POA: Diagnosis not present

## 2020-11-02 DIAGNOSIS — M818 Other osteoporosis without current pathological fracture: Secondary | ICD-10-CM

## 2020-11-02 DIAGNOSIS — M810 Age-related osteoporosis without current pathological fracture: Secondary | ICD-10-CM

## 2020-11-02 DIAGNOSIS — R319 Hematuria, unspecified: Secondary | ICD-10-CM | POA: Insufficient documentation

## 2020-11-02 LAB — POC URINALSYSI DIPSTICK (AUTOMATED)
Bilirubin, UA: NEGATIVE
Blood, UA: 200
Glucose, UA: NEGATIVE
Ketones, UA: NEGATIVE
Nitrite, UA: NEGATIVE
Protein, UA: POSITIVE — AB
Spec Grav, UA: >=1.03 — AB (ref 1.010–1.025)
Urobilinogen, UA: 0.2 E.U./dL
pH, UA: 5.5 (ref 5.0–8.0)

## 2020-11-02 MED ORDER — PHENAZOPYRIDINE HCL 200 MG PO TABS: 200.0000 mg | ORAL_TABLET | Freq: Three times a day (TID) | ORAL | 0 refills | Status: DC | PRN

## 2020-11-02 NOTE — Telephone Encounter (Signed)
-----   Message from Tammi Sou, [place] sent at 11/02/2020  2:33 PM EST ----- Regarding: Prolia Pt came in for an acute UTI appt but she wanted to make sure someone is handling her Prolia stuff pt said it's due this month, I told her I would like you all know  Just an [place]

## 2020-11-02 NOTE — Assessment & Plan Note (Signed)
With dysuria/recent uti  Taking cefdinir now - from [place] in [place] for dysuria and inst to drink lots of water  Culture sent  inst pt to call [facility] and see if culture is back yet (do not want to start new abx until we see sensitivities if possible)  inst to call if symptoms worsen or change

## 2020-11-02 NOTE — Progress Notes (Signed)
Subjective:    Patient ID: Gabrielle Webb, female    DOB: 08-Jun-1941, 79 y.o.   MRN: [id number]  This visit occurred during the SARS-CoV-2 public health emergency.  Safety protocols were in place, including screening questions prior to the visit, additional usage of staff PPE, and extensive cleaning of exam room while observing appropriate contact time as indicated for disinfecting solutions.    HPI Pt presents for urinary symptoms   She was treated for uti recently at [facility]  Side eff of cipro and changed to [facility] Has a 2nd culture pending now   Saw a little blood this am  Red urine 2-3 times  Now pink  Hurts a lot to urinate   No n/v No fever  Is having frequency and urgency   No flank pain    Was also seen for foot pain - lateral R foot pain from crossing foot under leg  Tylenol   Results for orders placed or performed in visit on 11/02/20  POCT Urinalysis Dipstick (Automated)  Result Value Ref Range   Color, UA Yellow    Clarity, UA Cloudy    Glucose, UA Negative Negative   Bilirubin, UA Negative    Ketones, UA Negative    Spec Grav, UA >=1.030 (A) 1.010 - 1.025   Blood, UA 200 Ery/uL    pH, UA 5.5 5.0 - 8.0   Protein, UA Positive (A) Negative   Urobilinogen, UA 0.2 0.2 or 1.0 E.U./dL   Nitrite, UA Negative    Leukocytes, UA Trace (A) Negative     Patient Active Problem List   Diagnosis Date Noted  . Hematuria 11/02/2020  . Breast lump on right side at 8 o'clock position 05/03/2020  . History of breast cancer 05/03/2020  . Medicare annual wellness visit, subsequent 05/28/2018  . Estrogen deficiency 05/28/2018  . Fatigue 05/22/2017  . Chest discomfort 05/22/2017  . S/P TAH-BSO 11/08/2016  . Routine general medical examination at a health care facility 05/06/2016  . Malignant neoplasm of upper-outer quadrant of right breast in female, estrogen receptor positive ([place]) 03/21/2015  . History of tamoxifen therapy 05/18/2014  . Encounter for  routine gynecological examination 05/06/2013  . Colon cancer screening 05/06/2013  . Encounter for Medicare annual wellness exam 04/28/2013  . Hx of radiation therapy   . Gynecological examination 12/25/2011  . Hx of Breast cancer, stage 1, Right, UOQ, Receptor+,Her2- 05/29/2011    Class: Stage 1  . Other screening mammogram 03/21/2011  . Post-menopausal 03/21/2011  . Hypothyroidism 01/23/2008  . Pure hypercholesterolemia 01/23/2008  . ALLERGIC RHINITIS 01/23/2008  . GERD 01/23/2008  . DIVERTICULOSIS, COLON 01/23/2008  . IBS 01/23/2008  . FIBROCYSTIC BREAST DISEASE 01/23/2008  . Osteoporosis 10/17/2007   Past Medical History:  Diagnosis Date  . Allergy   . Anxiety   . Breast cancer ([place]) 7/12   Right  . Cellulitis   . Diverticulosis of colon   . GERD (gastroesophageal reflux disease)   . Headache    sinus  . History of hiatal hernia   . Hx of radiation therapy 06/20/11 - 07/11/11   right breast  . Hypothyroid   . Osteoporosis   . Personal history of radiation therapy 2012   Right Breast Cancer  . PONV (postoperative nausea and vomiting)   . Skin cancer    basal and squamous cell  . Vertigo    Past Surgical History:  Procedure Laterality Date  . ABDOMINAL HYSTERECTOMY N/A 11/08/2016   Procedure: HYSTERECTOMY TOTAL  ABDOMINAL;  Surgeon: Dian Queen, MD;  Location: [facility];  Service: Gynecology;  Laterality: N/A;  . APPENDECTOMY  07/2007  . back sugery  1970  . BREAST BIOPSY Left    Benign  . BREAST EXCISIONAL BIOPSY Left 1983  . BREAST EXCISIONAL BIOPSY Left 1983  . BREAST LUMPECTOMY Right 05/16/2011  . COLONOSCOPY    . COLONOSCOPY WITH PROPOFOL N/A 08/27/2018   Procedure: COLONOSCOPY WITH PROPOFOL;  Surgeon: Manya Silvas, MD;  Location: [facility] ENDOSCOPY;  Service: Endoscopy;  Laterality: N/A;  . DILATION AND CURETTAGE OF UTERUS N/A 05/31/2014   Procedure: DILATATION AND CURETTAGE WITH ULTRASOUND GUIDANCE;  Surgeon: Cyril Mourning, MD;  Location: [facility];   Service: Gynecology;  Laterality: N/A;  . [place]  . right eye surgery    . SALPINGOOPHORECTOMY Bilateral 11/08/2016   Procedure: BILATERAL SALPINGO OOPHORECTOMY;  Surgeon: Dian Queen, MD;  Location: [facility];  Service: Gynecology;  Laterality: Bilateral;  . UPPER GI ENDOSCOPY     Social History   Tobacco Use  . Smoking status: Never Smoker  . Smokeless tobacco: Never Used  Vaping Use  . Vaping Use: Never used  Substance Use Topics  . Alcohol use: No    Alcohol/week: 0.0 standard drinks  . Drug use: No   Family History  Problem Relation Age of Onset  . Heart attack Father 54  . Hypertension Father   . Atrial fibrillation Mother   . Coronary artery disease Mother   . Diabetes Other        Grandmother  . Coronary artery disease Other        Grandmother  . Uterine cancer Other        Grandmother  . Leukemia Other        Aunt  . Cancer Maternal Aunt        leukemia  . Cancer Maternal Uncle        colon   Allergies  Allergen Reactions  . Codeine Nausea And Vomiting  . Fentanyl Nausea And Vomiting  . Hydromorphone Nausea And Vomiting       . Morphine Nausea And Vomiting  . Ciprofloxacin     Intolerant- tingling   . Tape     Surgical tape  . Prednisone Palpitations    Rapid hear beat   Current Outpatient Medications on File Prior to Visit  Medication Sig Dispense Refill  . aspirin 81 MG tablet Take 81 mg by mouth daily.     Marland Kitchen atorvastatin (LIPITOR) 10 MG tablet TAKE ONE TABLET BY MOUTH EVERY DAY 90 tablet 3  . BIOTIN PO Take by mouth daily.    Marland Kitchen CALCIUM PO Take 1,500 mg by mouth daily.    . cefdinir (OMNICEF) 300 MG capsule Take 1 capsule by mouth daily.    . chlorthalidone (HYGROTON) 50 MG tablet TAKE 1 TABLET BY MOUTH DAILY AS NEEDED FOR SWELLING 90 tablet 2  . Cholecalciferol (VITAMIN D) 2000 UNITS CAPS Take 1 capsule by mouth daily.    . Cyanocobalamin (B-12 PO) Take 2,500 mcg by mouth daily.    Marland Kitchen denosumab (PROLIA) 60 MG/ML SOSY injection  Inject 60 mg into the skin every 6 (six) months. 1 mL 0  . diclofenac sodium (VOLTAREN) 1 % GEL Apply 2 g topically 4 (four) times daily. Rub into affected area of foot 2 to 4 times daily (Patient taking differently: Apply 2 g topically as needed. Rub into affected area of foot 2 to 4 times daily) 100 g  2  . dicyclomine (BENTYL) 10 MG capsule TAKE 1 CAPSULE BY MOUTH TWICE DAILY FOR SPASMS 180 capsule 1  . famotidine (PEPCID) 20 MG tablet TAKE ONE TABLET EVERY DAY AS NEEDED FOR HEARTBURN OR INDIGESTION 90 tablet 1  . fluticasone (FLONASE) 50 MCG/ACT nasal spray TWO SPRAYS IN EACH NOSTRIL EVERY DAY 18 g 11  . levothyroxine (SYNTHROID) 100 MCG tablet TAKE ONE TABLET EVERY DAY 90 tablet 3  . LUTEIN PO Take 6 mg by mouth daily.    . Multiple Vitamin (MULTIVITAMIN) capsule Take 1 capsule by mouth daily.      . Naproxen Sodium (ALEVE PO) Take 1 tablet by mouth as needed.    . niacin 500 MG tablet Take 1,000 mg by mouth at bedtime.     Marland Kitchen OVER THE COUNTER MEDICATION Take 1 tablet by mouth daily as needed (allergies). Over the counter allergy medication     No current facility-administered medications on file prior to visit.     Review of Systems  Constitutional: Negative for activity change, appetite change, fatigue, fever and unexpected weight change.  HENT: Negative for congestion, ear pain, rhinorrhea, sinus pressure and sore throat.   Eyes: Negative for pain, redness and visual disturbance.  Respiratory: Negative for cough, shortness of breath and wheezing.   Cardiovascular: Negative for chest pain and palpitations.  Gastrointestinal: Negative for abdominal pain, blood in stool, constipation and diarrhea.  Endocrine: Negative for polydipsia and polyuria.  Genitourinary: Positive for dysuria, frequency, hematuria and urgency. Negative for flank pain.  Musculoskeletal: Negative for arthralgias, back pain and myalgias.  Skin: Negative for pallor and rash.  Allergic/Immunologic: Negative for  environmental allergies.  Neurological: Negative for dizziness, syncope and headaches.  Hematological: Negative for adenopathy. Does not bruise/bleed easily.  Psychiatric/Behavioral: Negative for decreased concentration and dysphoric mood. The patient is not nervous/anxious.        Objective:   Physical Exam Constitutional:      General: She is not in acute distress.    Appearance: Normal appearance. She is well-developed and normal weight. She is not ill-appearing.  HENT:     Head: Normocephalic and atraumatic.  Eyes:     Conjunctiva/sclera: Conjunctivae normal.     Pupils: Pupils are equal, round, and reactive to light.  Cardiovascular:     Rate and Rhythm: Normal rate and regular rhythm.     Heart sounds: Normal heart sounds.  Pulmonary:     Effort: Pulmonary effort is normal.     Breath sounds: Normal breath sounds.  Abdominal:     General: Bowel sounds are normal. There is no distension.     Palpations: Abdomen is soft.     Tenderness: There is abdominal tenderness. There is no right CVA tenderness or rebound.     Comments: No cva tenderness  Mild suprapubic tenderness  Musculoskeletal:     Cervical back: Normal range of motion and neck supple.  Lymphadenopathy:     Cervical: No cervical adenopathy.  Skin:    Findings: No rash.  Neurological:     Mental Status: She is alert.  Psychiatric:        Mood and Affect: Mood normal.           Assessment & Plan:   Problem List Items Addressed This Visit      Other   Hematuria    With dysuria/recent uti  Taking cefdinir now - from [place] in pyridium for dysuria and inst to drink lots of water  Culture sent  inst pt to call [facility] and see if culture is back yet (do not want to start new abx until we see sensitivities if possible)  inst to call if symptoms worsen or change       Relevant Orders   POCT Urinalysis Dipstick (Automated) (Completed)   Urine Culture

## 2020-11-02 NOTE — Patient Instructions (Addendum)
Call [facility] [facility] and let them know you were here and that you have blood in your urine (visible blood)   Drink lots of water  Leave a urine sample on the way out  Continue the cefdinir for now and we will advise from there   Most importantly we need the urine culture result to know what to do next   If symptoms suddenly worsen let me know   You can try pyridium for urinary pain (take with food)

## 2020-11-03 ENCOUNTER — Telehealth: Payer: Self-pay | Admitting: Family Medicine

## 2020-11-03 ENCOUNTER — Encounter: Payer: Self-pay | Admitting: Family Medicine

## 2020-11-03 LAB — URINE CULTURE
MICRO NUMBER:: 11263508
Result:: NO GROWTH
SPECIMEN QUALITY:: ADEQUATE

## 2020-11-03 MED ORDER — NITROFURANTOIN MONOHYD MACRO 100 MG PO CAPS: 100.0000 mg | ORAL_CAPSULE | Freq: Two times a day (BID) | ORAL | 0 refills | Status: DC

## 2020-11-03 NOTE — Telephone Encounter (Signed)
Received urine cx results. Placed in PCP's inbox for review  FYI it was positive for Enterococcus faecalis  And the sensitivities are  Cipro: S Levofloxacin: S Nitrofurantoin: S Penicillin: S Tetracycline: R Vancomycin: S  (that's all that's listed)

## 2020-11-03 NOTE — Telephone Encounter (Signed)
Pt was given office email address so she can send a copy of her urine cx results to [name].  FYI to PCP once we get them I will let you know

## 2020-11-03 NOTE — Telephone Encounter (Signed)
I sent macrobid to her pharmacy -please let her know

## 2020-11-03 NOTE — Telephone Encounter (Signed)
Pt called in wanted to know about getting a phone call for lab results that she received.

## 2020-11-04 DIAGNOSIS — Z961 Presence of intraocular lens: Secondary | ICD-10-CM | POA: Diagnosis not present

## 2020-11-04 NOTE — Telephone Encounter (Signed)
Pt notified she wasn't sure if she is suppose to stop the old abx (omnicef) or take both, please advise

## 2020-11-04 NOTE — Telephone Encounter (Signed)
Stop the previous abx

## 2020-11-04 NOTE — Telephone Encounter (Signed)
Benefits submitted do not have form to document on it. Have printed face sheet to check with out of pocket with rep when I talk to her

## 2020-11-04 NOTE — Telephone Encounter (Signed)
Pt.notified

## 2020-11-21 MED ORDER — PROLIA 60 MG/ML ~~LOC~~ SOSY: 60.0000 mg | PREFILLED_SYRINGE | SUBCUTANEOUS | 0 refills | Status: DC

## 2020-11-21 NOTE — Telephone Encounter (Signed)
See note below the access nurse note; Randall An RN has already spoken with pt this morning and per note nothing further needed.

## 2020-11-21 NOTE — Telephone Encounter (Signed)
Called about getting the prolia shot

## 2020-11-21 NOTE — Telephone Encounter (Signed)
Per benefits, pt will owe $240 for prolia and $23 for admin, no PA required. Contacted pt who reports she get the prolia at [facility] and only a script needs to be sent in and then they call her when they have it and she picks it up and she lets the office know so she can pick it up and come straight here for inj.   Scheduled lab for tomorrow, 12/21 and inj on 12/23. Will contact pharmacy to make sure they can get it by 12/23. Pt must get this done this year or she will have to pay 3 co-pays of $180 each due to new year.  Placed order for BMP. Contacted [facility] and spoke with Kennyth Lose. She said once the prolia is ordered it will arrive the next day after 2pm. Advised a script would be sent.  Sent in script.  Contacted pt and advised of script and if any problems let this office know. Pt will pick up scrip on 12/22 and be in on 12/23 for inj. Pt verbalized understanding.

## 2020-11-21 NOTE — Telephone Encounter (Signed)
[facility] - Client Nonclinical Telephone Record AccessNurse Client [facility] - Client Client Site [place] Physician Loura Pardon - MD Contact Type Call Who Is Calling Patient / Member / Family / Caregiver Caller Name [place] Phone Number [phone] Patient Name Gabrielle Webb Patient DOB [birth year] Call Type Message Only Information Provided Reason for Call Request for General Office Information Initial Comment Caller states she is was to get her Prolia on 12/02. She has not heard from office. She needs to have it ordered. Additional Comment hours provided Disp. Time Disposition Final User 11/21/2020 7:58:20 AM General Information Provided Yes Idolina Primer Call Closed By: Idolina Primer Transaction Date/Time: 11/21/2020 7:54:52 AM (ET)

## 2020-11-21 NOTE — Telephone Encounter (Signed)
Pt has been contacted. This has been documented in another phone note.

## 2020-11-22 ENCOUNTER — Other Ambulatory Visit: Payer: Self-pay

## 2020-11-22 ENCOUNTER — Other Ambulatory Visit (INDEPENDENT_AMBULATORY_CARE_PROVIDER_SITE_OTHER): Payer: PPO

## 2020-11-22 DIAGNOSIS — M818 Other osteoporosis without current pathological fracture: Secondary | ICD-10-CM

## 2020-11-22 LAB — BASIC METABOLIC PANEL
BUN: 13 mg/dL (ref 6–23)
CO2: 30 mEq/L (ref 19–32)
Calcium: 9.1 mg/dL (ref 8.4–10.5)
Chloride: 105 mEq/L (ref 96–112)
Creatinine, Ser: 0.75 mg/dL (ref 0.40–1.20)
GFR: 76.74 mL/min (ref >60.00)
Glucose, Bld: 84 mg/dL (ref 70–99)
Potassium: 4.1 mEq/L (ref 3.5–5.1)
Sodium: 140 mEq/L (ref 135–145)

## 2020-11-22 NOTE — Telephone Encounter (Signed)
Labs normal, Ca normal and CrCl normal at 63.67.  Pt may get prolia inj. Schedule for tomorrow.

## 2020-11-24 ENCOUNTER — Other Ambulatory Visit: Payer: Self-pay

## 2020-11-24 ENCOUNTER — Ambulatory Visit (INDEPENDENT_AMBULATORY_CARE_PROVIDER_SITE_OTHER): Payer: PPO | Admitting: *Deleted

## 2020-11-24 DIAGNOSIS — M810 Age-related osteoporosis without current pathological fracture: Secondary | ICD-10-CM

## 2020-11-24 MED ORDER — DENOSUMAB 60 MG/ML ~~LOC~~ SOSY
60.0000 mg | PREFILLED_SYRINGE | Freq: Once | SUBCUTANEOUS | Status: AC
Start: 2020-11-24 — End: 2020-11-24
  Administered 2020-11-24: 10:00:00 60 mg via SUBCUTANEOUS

## 2020-11-24 NOTE — Progress Notes (Signed)
Per orders of Dr. Tower, injection of Prolia given by Nasean Zapf Simpson. Patient tolerated injection well. 

## 2020-11-29 ENCOUNTER — Ambulatory Visit: Payer: PPO

## 2020-12-07 ENCOUNTER — Other Ambulatory Visit: Payer: Self-pay | Admitting: Family Medicine

## 2020-12-07 DIAGNOSIS — Z1231 Encounter for screening mammogram for malignant neoplasm of breast: Secondary | ICD-10-CM

## 2020-12-08 DIAGNOSIS — X32XXXA Exposure to sunlight, initial encounter: Secondary | ICD-10-CM | POA: Diagnosis not present

## 2020-12-08 DIAGNOSIS — D2262 Melanocytic nevi of left upper limb, including shoulder: Secondary | ICD-10-CM | POA: Diagnosis not present

## 2020-12-08 DIAGNOSIS — Z85828 Personal history of other malignant neoplasm of skin: Secondary | ICD-10-CM | POA: Diagnosis not present

## 2020-12-08 DIAGNOSIS — L821 Other seborrheic keratosis: Secondary | ICD-10-CM | POA: Diagnosis not present

## 2020-12-08 DIAGNOSIS — D2261 Melanocytic nevi of right upper limb, including shoulder: Secondary | ICD-10-CM | POA: Diagnosis not present

## 2020-12-08 DIAGNOSIS — D225 Melanocytic nevi of trunk: Secondary | ICD-10-CM | POA: Diagnosis not present

## 2020-12-08 DIAGNOSIS — Z09 Encounter for follow-up examination after completed treatment for conditions other than malignant neoplasm: Secondary | ICD-10-CM | POA: Diagnosis not present

## 2020-12-08 DIAGNOSIS — L570 Actinic keratosis: Secondary | ICD-10-CM | POA: Diagnosis not present

## 2020-12-21 DIAGNOSIS — H00015 Hordeolum externum left lower eyelid: Secondary | ICD-10-CM | POA: Diagnosis not present

## 2021-01-31 DIAGNOSIS — H0011 Chalazion right upper eyelid: Secondary | ICD-10-CM | POA: Diagnosis not present

## 2021-02-24 ENCOUNTER — Other Ambulatory Visit: Payer: Self-pay

## 2021-02-24 ENCOUNTER — Encounter: Payer: Self-pay | Admitting: Emergency Medicine

## 2021-02-24 ENCOUNTER — Telehealth: Payer: Self-pay

## 2021-02-24 ENCOUNTER — Ambulatory Visit
Admission: EM | Admit: 2021-02-24 | Discharge: 2021-02-24 | Disposition: A | Discharge status: Home / Self Care | Payer: PPO | Attending: Family Medicine | Admitting: Family Medicine

## 2021-02-24 DIAGNOSIS — N3001 Acute cystitis with hematuria: Secondary | ICD-10-CM | POA: Diagnosis not present

## 2021-02-24 DIAGNOSIS — R300 Dysuria: Secondary | ICD-10-CM | POA: Diagnosis not present

## 2021-02-24 LAB — POCT URINALYSIS DIP (MANUAL ENTRY)
Bilirubin, UA: NEGATIVE
Glucose, UA: NEGATIVE mg/dL
Nitrite, UA: NEGATIVE
Protein Ur, POC: NEGATIVE mg/dL
Spec Grav, UA: >=1.03 — AB (ref 1.010–1.025)
Urobilinogen, UA: 0.2 E.U./dL
pH, UA: 6 (ref 5.0–8.0)

## 2021-02-24 MED ORDER — CEPHALEXIN 500 MG PO CAPS: 500.0000 mg | ORAL_CAPSULE | Freq: Two times a day (BID) | ORAL | 0 refills | Status: AC

## 2021-02-24 NOTE — Discharge Instructions (Addendum)
Your urine appears to be infected I am sending for culture to be sure but will treat you with some antibiotics in the meantime.  Drink plenty of water.  Follow up as needed for continued or worsening symptoms

## 2021-02-24 NOTE — Telephone Encounter (Signed)
Noted. Agree.

## 2021-02-24 NOTE — Telephone Encounter (Signed)
Pt left v/m requesting abx for beginning of UTI to [facility]. No available appt at [facility] or [place]. I spoke with pt and advised why needed appt and urine ck. Pt voiced understanding and pt has to go see her mom who is not doing well and then will go by [facility] in [place] after that. Sending note to Dr Glori Bickers who is out of office and Dr Darnell Level who is in office.

## 2021-02-24 NOTE — ED Triage Notes (Signed)
Patient c/o dysuria x 2 days.   Patient endorses "it just hurts when I urinate".   Patient denies any foul smelling urine and hematuria.   Patient endorses some ABD pain.   Patient has used cranberry juice and increased fluids with no relief of symptoms.

## 2021-02-27 LAB — URINE CULTURE: Culture: 50000 — AB

## 2021-02-27 NOTE — ED Provider Notes (Signed)
[name]    CSN: [id number] Arrival date & time: 02/24/21  1528      History   Chief Complaint Chief Complaint  Patient presents with  . Dysuria    HPI Gabrielle Webb is a 80 y.o. female.   Patient is a 80 year old female presents today with dysuria.  This is been present for the past 2 days.  Denies any hematuria or urinary frequency.  Some mild lower abdominal discomfort.  Has been drinking cranberry juice and increasing fluid with no relief.  No fevers, chills, flank pain, nausea or vomiting.   Dysuria   Past Medical History:  Diagnosis Date  . Allergy   . Anxiety   . Breast cancer ([place]) 7/12   Right  . Cellulitis   . Diverticulosis of colon   . GERD (gastroesophageal reflux disease)   . Headache    sinus  . History of hiatal hernia   . Hx of radiation therapy 06/20/11 - 07/11/11   right breast  . Hypothyroid   . Osteoporosis   . Personal history of radiation therapy 2012   Right Breast Cancer  . PONV (postoperative nausea and vomiting)   . Skin cancer    basal and squamous cell  . Vertigo     Patient Active Problem List   Diagnosis Date Noted  . Hematuria 11/02/2020  . Breast lump on right side at 8 o'clock position 05/03/2020  . History of breast cancer 05/03/2020  . Medicare annual wellness visit, subsequent 05/28/2018  . Estrogen deficiency 05/28/2018  . Fatigue 05/22/2017  . Chest discomfort 05/22/2017  . S/P TAH-BSO 11/08/2016  . Routine general medical examination at a health care facility 05/06/2016  . Malignant neoplasm of upper-outer quadrant of right breast in female, estrogen receptor positive ([place]) 03/21/2015  . History of tamoxifen therapy 05/18/2014  . Encounter for routine gynecological examination 05/06/2013  . Colon cancer screening 05/06/2013  . Encounter for Medicare annual wellness exam 04/28/2013  . Hx of radiation therapy   . Gynecological examination 12/25/2011  . Hx of Breast cancer, stage 1, Right, UOQ,  Receptor+,Her2- 05/29/2011    Class: Stage 1  . Other screening mammogram 03/21/2011  . Post-menopausal 03/21/2011  . Hypothyroidism 01/23/2008  . Pure hypercholesterolemia 01/23/2008  . ALLERGIC RHINITIS 01/23/2008  . GERD 01/23/2008  . DIVERTICULOSIS, COLON 01/23/2008  . IBS 01/23/2008  . FIBROCYSTIC BREAST DISEASE 01/23/2008  . Osteoporosis 10/17/2007    Past Surgical History:  Procedure Laterality Date  . ABDOMINAL HYSTERECTOMY N/A 11/08/2016   Procedure: HYSTERECTOMY TOTAL  ABDOMINAL;  Surgeon: Dian Queen, MD;  Location: [facility];  Service: Gynecology;  Laterality: N/A;  . APPENDECTOMY  07/2007  . back sugery  1970  . BREAST BIOPSY Left    Benign  . BREAST EXCISIONAL BIOPSY Left 1983  . BREAST EXCISIONAL BIOPSY Left 1983  . BREAST LUMPECTOMY Right 05/16/2011  . COLONOSCOPY    . COLONOSCOPY WITH PROPOFOL N/A 08/27/2018   Procedure: COLONOSCOPY WITH PROPOFOL;  Surgeon: Manya Silvas, MD;  Location: [facility];  Service: Endoscopy;  Laterality: N/A;  . DILATION AND CURETTAGE OF UTERUS N/A 05/31/2014   Procedure: DILATATION AND CURETTAGE WITH ULTRASOUND GUIDANCE;  Surgeon: Cyril Mourning, MD;  Location: [facility];  Service: Gynecology;  Laterality: N/A;  . [place]  . right eye surgery    . SALPINGOOPHORECTOMY Bilateral 11/08/2016   Procedure: BILATERAL SALPINGO OOPHORECTOMY;  Surgeon: Dian Queen, MD;  Location: [facility];  Service: Gynecology;  Laterality:  Bilateral;  . UPPER GI ENDOSCOPY      OB History   No obstetric history on file.      Home Medications    Prior to Admission medications   Medication Sig Start Date End Date Taking? Authorizing Provider  cephALEXin (KEFLEX) 500 MG capsule Take 1 capsule (500 mg total) by mouth 2 (two) times daily for 5 days. 02/24/21 03/01/21 Yes Aireona Torelli A, NP  aspirin 81 MG tablet Take 81 mg by mouth daily.    [provider]  atorvastatin (LIPITOR) 10 MG tablet TAKE ONE TABLET BY MOUTH EVERY DAY  03/29/20   Tower, Wynelle Fanny, MD  BIOTIN PO Take by mouth daily.    [provider]  CALCIUM PO Take 1,500 mg by mouth daily.    [provider]  chlorthalidone (HYGROTON) 50 MG tablet TAKE 1 TABLET BY MOUTH DAILY AS NEEDED FOR SWELLING 11/07/18   Tower, Wynelle Fanny, MD  Cholecalciferol (VITAMIN D) 2000 UNITS CAPS Take 1 capsule by mouth daily.    [provider]  Cyanocobalamin (B-12 PO) Take 2,500 mcg by mouth daily.    [provider]  denosumab (PROLIA) 60 MG/ML SOSY injection Inject 60 mg into the skin every 6 (six) months. 11/21/20   Tower, Wynelle Fanny, MD  diclofenac sodium (VOLTAREN) 1 % GEL Apply 2 g topically 4 (four) times daily. Rub into affected area of foot 2 to 4 times daily Patient taking differently: Apply 2 g topically as needed. Rub into affected area of foot 2 to 4 times daily 12/21/14   Trula Slade, DPM  dicyclomine (BENTYL) 10 MG capsule TAKE 1 CAPSULE BY MOUTH TWICE DAILY FOR SPASMS 09/28/20   Tower, Wynelle Fanny, MD  famotidine (PEPCID) 20 MG tablet TAKE ONE TABLET EVERY DAY AS NEEDED FOR HEARTBURN OR INDIGESTION 09/28/20   Tower, Wynelle Fanny, MD  fluticasone (FLONASE) 50 MCG/ACT nasal spray TWO SPRAYS IN EACH NOSTRIL EVERY DAY 06/20/20   Tower, Wynelle Fanny, MD  levothyroxine (SYNTHROID) 100 MCG tablet TAKE ONE TABLET EVERY DAY 03/29/20   Tower, Wynelle Fanny, MD  LUTEIN PO Take 6 mg by mouth daily.    [provider]  Multiple Vitamin (MULTIVITAMIN) capsule Take 1 capsule by mouth daily.    [provider]  Naproxen Sodium (ALEVE PO) Take 1 tablet by mouth as needed.    [provider]  niacin 500 MG tablet Take 1,000 mg by mouth at bedtime.    [provider]  nitrofurantoin, macrocrystal-monohydrate, (MACROBID) 100 MG capsule Take 1 capsule (100 mg total) by mouth 2 (two) times daily. 11/03/20   Tower, Wynelle Fanny, MD  OVER THE COUNTER MEDICATION Take 1 tablet by mouth daily as needed (allergies). Over the counter allergy medication     [provider]  phenazopyridine (PYRIDIUM) 200 MG tablet Take 1 tablet (200 mg total) by mouth 3 (three) times daily as needed for pain (urinary pain). 11/02/20   Tower, Wynelle Fanny, MD    Family History Family History  Problem Relation Age of Onset  . Heart attack Father 65  . Hypertension Father   . Atrial fibrillation Mother   . Coronary artery disease Mother   . Diabetes Other        Grandmother  . Coronary artery disease Other        Grandmother  . Uterine cancer Other        Grandmother  . Leukemia Other        Aunt  .  Cancer Maternal Aunt        leukemia  . Cancer Maternal Uncle        colon    Social History Social History   Tobacco Use  . Smoking status: Never Smoker  . Smokeless tobacco: Never Used  Vaping Use  . Vaping Use: Never used  Substance Use Topics  . Alcohol use: No    Alcohol/week: 0.0 standard drinks  . Drug use: No     Allergies   Codeine, Fentanyl, Hydromorphone, Morphine, Ciprofloxacin, Tape, and Prednisone   Review of Systems Review of Systems  Genitourinary: Positive for dysuria.     Physical Exam Triage Vital Signs ED Triage Vitals  Enc Vitals Group     BP 02/24/21 1544 110/69     Pulse Rate 02/24/21 1544 83     Resp 02/24/21 1544 16     Temp 02/24/21 1544 98.6 F (37 C)     Temp Source 02/24/21 1544 Oral     SpO2 02/24/21 1544 97 %     Weight --      Height --      Head Circumference --      Peak Flow --      Pain Score 02/24/21 1542 4     Pain Loc --      Pain Edu? --      Excl. in [place]? --    No data found.  Updated Vital Signs BP 110/69 (BP Location: Left Arm)   Pulse 83   Temp 98.6 F (37 C) (Oral)   Resp 16   LMP 12/03/1988   SpO2 97%   Visual Acuity Right Eye Distance:   Left Eye Distance:   Bilateral Distance:    Right Eye Near:   Left Eye Near:    Bilateral Near:     Physical Exam Vitals and nursing note reviewed.  Constitutional:      General: She is not in acute distress.     Appearance: Normal appearance. She is not ill-appearing, toxic-appearing or diaphoretic.  HENT:     Head: Normocephalic.  Eyes:     Conjunctiva/sclera: Conjunctivae normal.  Pulmonary:     Effort: Pulmonary effort is normal.  Musculoskeletal:        General: Normal range of motion.     Cervical back: Normal range of motion.  Skin:    General: Skin is warm and dry.     Findings: No rash.  Neurological:     Mental Status: She is alert.  Psychiatric:        Mood and Affect: Mood normal.      UC Treatments / Results  Labs (all labs ordered are listed, but only abnormal results are displayed) Labs Reviewed  URINE CULTURE - Abnormal; Notable for the following components:      Result Value   Culture   (*)    Value: 50,000 COLONIES/mL PROTEUS MIRABILIS SUSCEPTIBILITIES TO FOLLOW Performed at [facility], [place] [address], [place], [place] [address]    All other components within normal limits  POCT URINALYSIS DIP (MANUAL ENTRY) - Abnormal; Notable for the following components:   Clarity, UA cloudy (*)    Ketones, POC UA trace (5) (*)    Spec Grav, UA >=1.030 (*)    Blood, UA moderate (*)    Leukocytes, UA Small (1+) (*)    All other components within normal limits    EKG   Radiology No results found.  Procedures Procedures (including critical care  time)  Medications Ordered in UC Medications - No data to display  Initial Impression / Assessment and Plan / UC Course  I have reviewed the triage vital signs and the nursing notes.  Pertinent labs & imaging results that were available during my care of the patient were reviewed by me and considered in my medical decision making (see chart for details).     Acute cystitis with hematuria Urine with small leuks, moderate blood and cloudy clarity. Sending for culture. We will go and treat with antibiotics at this time based on symptoms and urinalysis.  Treating with Keflex.  Recommended push fluids. Follow up as  needed for continued or worsening symptoms  Final Clinical Impressions(s) / UC Diagnoses   Final diagnoses:  Acute cystitis with hematuria     Discharge Instructions     Your urine appears to be infected I am sending for culture to be sure but will treat you with some antibiotics in the meantime.  Drink plenty of water.  Follow up as needed for continued or worsening symptoms     ED Prescriptions    Medication Sig Dispense Auth. Provider   cephALEXin (KEFLEX) 500 MG capsule Take 1 capsule (500 mg total) by mouth 2 (two) times daily for 5 days. 10 capsule Loura Halt A, NP     PDMP not reviewed this encounter.   Orvan July, NP 02/27/21 [phone]

## 2021-03-30 ENCOUNTER — Ambulatory Visit
Admission: EM | Admit: 2021-03-30 | Discharge: 2021-03-30 | Disposition: A | Discharge status: Home / Self Care | Payer: PPO | Attending: Emergency Medicine | Admitting: Emergency Medicine

## 2021-03-30 ENCOUNTER — Other Ambulatory Visit: Payer: Self-pay

## 2021-03-30 ENCOUNTER — Telehealth: Payer: Self-pay

## 2021-03-30 DIAGNOSIS — W57XXXA Bitten or stung by nonvenomous insect and other nonvenomous arthropods, initial encounter: Secondary | ICD-10-CM

## 2021-03-30 DIAGNOSIS — L03818 Cellulitis of other sites: Secondary | ICD-10-CM | POA: Diagnosis not present

## 2021-03-30 MED ORDER — MUPIROCIN 2 % EX OINT: 1.0000 "application " | TOPICAL_OINTMENT | Freq: Two times a day (BID) | CUTANEOUS | 0 refills | Status: DC

## 2021-03-30 NOTE — ED Triage Notes (Signed)
Pt presents with complaints of two bites on the right side of her back. Reports she was outside cleaning and not sure if something bit her. She did use antibiotic cream. She first noticed the bites on Monday or Tuesday.

## 2021-03-30 NOTE — Discharge Instructions (Signed)
Keep your wound clean and dry.  Wash it gently twice a day with soap and water.  Apply the prescribed antibiotic ointment as directed.     Follow-up with your primary care provider or return here if you see signs of infection, such as increased pain, redness, pus-like drainage, warmth, fever, chills, or other concerning symptoms.

## 2021-03-30 NOTE — Telephone Encounter (Signed)
A covid booster if she has not had one (imms may not be utd in chart for that) Otherwise utd Get a flu shot every fall

## 2021-03-30 NOTE — Telephone Encounter (Addendum)
Checked state database and pt had a covid booster on 09/15/20, chart updated. Called pt and advised her of this and she verbalized understanding. She will get flu shot in the fall and she normally does

## 2021-03-30 NOTE — Telephone Encounter (Signed)
Patient called and LVM on triage line and wanted to know which immunizations she was due for this year and which ones were recommended? Please advise.

## 2021-03-30 NOTE — ED Provider Notes (Signed)
[facility]    CSN: [id number] Arrival date & time: 03/30/21  1036      History   Chief Complaint Chief Complaint  Patient presents with  . Rash  . Insect Bite    HPI Gabrielle Webb is a 80 y.o. female.   Patient presents with 2 insect bites on her right lower back which occurred 3 days ago.  She states she was cleaning in her garage and outside; she did not feel anything bite or sting her.  The wounds are nontender and mildly pruritic.  She denies drainage, fever, chills, or other symptoms.  Her medical history includes allergic rhinitis, breast cancer, GERD, diverticulosis, IBS.  The history is provided by the patient and medical records.    Past Medical History:  Diagnosis Date  . Allergy   . Anxiety   . Breast cancer ([place]) 7/12   Right  . Cellulitis   . Diverticulosis of colon   . GERD (gastroesophageal reflux disease)   . Headache    sinus  . History of hiatal hernia   . Hx of radiation therapy 06/20/11 - 07/11/11   right breast  . Hypothyroid   . Osteoporosis   . Personal history of radiation therapy 2012   Right Breast Cancer  . PONV (postoperative nausea and vomiting)   . Skin cancer    basal and squamous cell  . Vertigo     Patient Active Problem List   Diagnosis Date Noted  . Hematuria 11/02/2020  . Breast lump on right side at 8 o'clock position 05/03/2020  . History of breast cancer 05/03/2020  . Medicare annual wellness visit, subsequent 05/28/2018  . Estrogen deficiency 05/28/2018  . Fatigue 05/22/2017  . Chest discomfort 05/22/2017  . S/P TAH-BSO 11/08/2016  . Routine general medical examination at a health care facility 05/06/2016  . Malignant neoplasm of upper-outer quadrant of right breast in female, estrogen receptor positive ([place]) 03/21/2015  . History of tamoxifen therapy 05/18/2014  . Encounter for routine gynecological examination 05/06/2013  . Colon cancer screening 05/06/2013  . Encounter for Medicare annual wellness  exam 04/28/2013  . Hx of radiation therapy   . Gynecological examination 12/25/2011  . Hx of Breast cancer, stage 1, Right, UOQ, Receptor+,Her2- 05/29/2011    Class: Stage 1  . Other screening mammogram 03/21/2011  . Post-menopausal 03/21/2011  . Hypothyroidism 01/23/2008  . Pure hypercholesterolemia 01/23/2008  . ALLERGIC RHINITIS 01/23/2008  . GERD 01/23/2008  . DIVERTICULOSIS, COLON 01/23/2008  . IBS 01/23/2008  . FIBROCYSTIC BREAST DISEASE 01/23/2008  . Osteoporosis 10/17/2007    Past Surgical History:  Procedure Laterality Date  . ABDOMINAL HYSTERECTOMY N/A 11/08/2016   Procedure: HYSTERECTOMY TOTAL  ABDOMINAL;  Surgeon: Dian Queen, MD;  Location: [facility];  Service: Gynecology;  Laterality: N/A;  . APPENDECTOMY  07/2007  . back sugery  1970  . BREAST BIOPSY Left    Benign  . BREAST EXCISIONAL BIOPSY Left 1983  . BREAST EXCISIONAL BIOPSY Left 1983  . BREAST LUMPECTOMY Right 05/16/2011  . COLONOSCOPY    . COLONOSCOPY WITH PROPOFOL N/A 08/27/2018   Procedure: COLONOSCOPY WITH PROPOFOL;  Surgeon: Manya Silvas, MD;  Location: [facility];  Service: Endoscopy;  Laterality: N/A;  . DILATION AND CURETTAGE OF UTERUS N/A 05/31/2014   Procedure: DILATATION AND CURETTAGE WITH ULTRASOUND GUIDANCE;  Surgeon: Cyril Mourning, MD;  Location: [facility];  Service: Gynecology;  Laterality: N/A;  . [place]  . right eye surgery    .  SALPINGOOPHORECTOMY Bilateral 11/08/2016   Procedure: BILATERAL SALPINGO OOPHORECTOMY;  Surgeon: Dian Queen, MD;  Location: [facility];  Service: Gynecology;  Laterality: Bilateral;  . UPPER GI ENDOSCOPY      OB History   No obstetric history on file.      Home Medications    Prior to Admission medications   Medication Sig Start Date End Date Taking? Authorizing Provider  mupirocin ointment (BACTROBAN) 2 % Apply 1 application topically 2 (two) times daily. 03/30/21  Yes Sharion Balloon, NP  aspirin 81 MG tablet Take 81 mg by mouth daily.     [provider]  atorvastatin (LIPITOR) 10 MG tablet TAKE ONE TABLET BY MOUTH EVERY DAY 03/29/20   Tower, Wynelle Fanny, MD  BIOTIN PO Take by mouth daily.    [provider]  CALCIUM PO Take 1,500 mg by mouth daily.    [provider]  chlorthalidone (HYGROTON) 50 MG tablet TAKE 1 TABLET BY MOUTH DAILY AS NEEDED FOR SWELLING 11/07/18   Tower, Wynelle Fanny, MD  Cholecalciferol (VITAMIN D) 2000 UNITS CAPS Take 1 capsule by mouth daily.    [provider]  Cyanocobalamin (B-12 PO) Take 2,500 mcg by mouth daily.    [provider]  denosumab (PROLIA) 60 MG/ML SOSY injection Inject 60 mg into the skin every 6 (six) months. 11/21/20   Tower, Wynelle Fanny, MD  diclofenac sodium (VOLTAREN) 1 % GEL Apply 2 g topically 4 (four) times daily. Rub into affected area of foot 2 to 4 times daily Patient taking differently: Apply 2 g topically as needed. Rub into affected area of foot 2 to 4 times daily 12/21/14   Trula Slade, DPM  dicyclomine (BENTYL) 10 MG capsule TAKE 1 CAPSULE BY MOUTH TWICE DAILY FOR SPASMS 09/28/20   Tower, Wynelle Fanny, MD  famotidine (PEPCID) 20 MG tablet TAKE ONE TABLET EVERY DAY AS NEEDED FOR HEARTBURN OR INDIGESTION 09/28/20   Tower, Wynelle Fanny, MD  fluticasone (FLONASE) 50 MCG/ACT nasal spray TWO SPRAYS IN EACH NOSTRIL EVERY DAY 06/20/20   Tower, Wynelle Fanny, MD  levothyroxine (SYNTHROID) 100 MCG tablet TAKE ONE TABLET EVERY DAY 03/29/20   Tower, Wynelle Fanny, MD  LUTEIN PO Take 6 mg by mouth daily.    [provider]  Multiple Vitamin (MULTIVITAMIN) capsule Take 1 capsule by mouth daily.    [provider]  Naproxen Sodium (ALEVE PO) Take 1 tablet by mouth as needed.    [provider]  niacin 500 MG tablet Take 1,000 mg by mouth at bedtime.    [provider]  nitrofurantoin, macrocrystal-monohydrate, (MACROBID) 100 MG capsule Take 1 capsule (100 mg total) by mouth 2 (two) times daily. 11/03/20   Tower, Wynelle Fanny, MD  OVER THE COUNTER  MEDICATION Take 1 tablet by mouth daily as needed (allergies). Over the counter allergy medication    [provider]  phenazopyridine (PYRIDIUM) 200 MG tablet Take 1 tablet (200 mg total) by mouth 3 (three) times daily as needed for pain (urinary pain). 11/02/20   Tower, Wynelle Fanny, MD    Family History Family History  Problem Relation Age of Onset  . Heart attack Father 100  . Hypertension Father   . Atrial fibrillation Mother   . Coronary artery disease Mother   . Diabetes Other        Grandmother  . Coronary artery disease Other        Grandmother  . Uterine cancer Other  Grandmother  . Leukemia Other        Aunt  . Cancer Maternal Aunt        leukemia  . Cancer Maternal Uncle        colon    Social History Social History   Tobacco Use  . Smoking status: Never Smoker  . Smokeless tobacco: Never Used  Vaping Use  . Vaping Use: Never used  Substance Use Topics  . Alcohol use: No    Alcohol/week: 0.0 standard drinks  . Drug use: No     Allergies   Codeine, Fentanyl, Hydromorphone, Morphine, Ciprofloxacin, Tape, and Prednisone   Review of Systems Review of Systems  Constitutional: Negative for chills and fever.  HENT: Negative for ear pain and sore throat.   Eyes: Negative for pain and visual disturbance.  Respiratory: Negative for cough and shortness of breath.   Cardiovascular: Negative for chest pain and palpitations.  Gastrointestinal: Negative for abdominal pain and vomiting.  Genitourinary: Negative for dysuria and hematuria.  Musculoskeletal: Negative for arthralgias and back pain.  Skin: Positive for color change and wound.  Neurological: Negative for syncope, weakness and numbness.  All other systems reviewed and are negative.    Physical Exam Triage Vital Signs ED Triage Vitals  Enc Vitals Group     BP      Pulse      Resp      Temp      Temp src      SpO2      Weight      Height      Head Circumference      Peak Flow       Pain Score      Pain Loc      Pain Edu?      Excl. in [place]?    No data found.  Updated Vital Signs BP 108/72   Pulse 76   Temp 97.7 F (36.5 C)   Resp 19   LMP 12/03/1988   SpO2 98%   Visual Acuity Right Eye Distance:   Left Eye Distance:   Bilateral Distance:    Right Eye Near:   Left Eye Near:    Bilateral Near:     Physical Exam Vitals and nursing note reviewed.  Constitutional:      General: She is not in acute distress.    Appearance: She is well-developed. She is not ill-appearing.  HENT:     Head: Normocephalic and atraumatic.     Mouth/Throat:     Mouth: Mucous membranes are moist.  Eyes:     Conjunctiva/sclera: Conjunctivae normal.  Cardiovascular:     Rate and Rhythm: Normal rate and regular rhythm.     Heart sounds: Normal heart sounds.  Pulmonary:     Effort: Pulmonary effort is normal. No respiratory distress.     Breath sounds: Normal breath sounds.  Abdominal:     Palpations: Abdomen is soft.     Tenderness: There is no abdominal tenderness.  Musculoskeletal:        General: Normal range of motion.     Cervical back: Neck supple.  Skin:    General: Skin is warm and dry.     Capillary Refill: Capillary refill takes less than 2 seconds.     Findings: Lesion present.     Comments: Two small areas of erythema with central pustule on right lower back. See picture for details.   Neurological:     General: No focal deficit present.  Mental Status: She is alert and oriented to person, place, and time.     Gait: Gait normal.  Psychiatric:        Mood and Affect: Mood normal.        Behavior: Behavior normal.        UC Treatments / Results  Labs (all labs ordered are listed, but only abnormal results are displayed) Labs Reviewed - No data to display  EKG   Radiology No results found.  Procedures Procedures (including critical care time)  Medications Ordered in UC Medications - No data to display  Initial Impression / Assessment  and Plan / UC Course  I have reviewed the triage vital signs and the nursing notes.  Pertinent labs & imaging results that were available during my care of the patient were reviewed by me and considered in my medical decision making (see chart for details).   Insect bites with mild localized cellulitis.  Treating with mupirocin ointment.  Discussed Benadryl as needed for itching.  Wound care instructions and signs of infection discussed.  Instructed patient to follow-up with her PCP or return here if she notes signs of infection.  She agrees to plan of care.   Final Clinical Impressions(s) / UC Diagnoses   Final diagnoses:  Insect bites and stings, initial encounter  Cellulitis of other specified site     Discharge Instructions     Keep your wound clean and dry.  Wash it gently twice a day with soap and water.  Apply the prescribed antibiotic ointment as directed.     Follow-up with your primary care provider or return here if you see signs of infection, such as increased pain, redness, pus-like drainage, warmth, fever, chills, or other concerning symptoms.         ED Prescriptions    Medication Sig Dispense Auth. Provider   mupirocin ointment (BACTROBAN) 2 % Apply 1 application topically 2 (two) times daily. 22 g Sharion Balloon, NP     PDMP not reviewed this encounter.   Sharion Balloon, NP 03/30/21 1106

## 2021-04-19 ENCOUNTER — Telehealth: Payer: Self-pay

## 2021-04-19 DIAGNOSIS — E039 Hypothyroidism, unspecified: Secondary | ICD-10-CM

## 2021-04-19 DIAGNOSIS — M818 Other osteoporosis without current pathological fracture: Secondary | ICD-10-CM

## 2021-04-19 DIAGNOSIS — Z0000 Encounter for general adult medical examination without abnormal findings: Secondary | ICD-10-CM

## 2021-04-19 DIAGNOSIS — M810 Age-related osteoporosis without current pathological fracture: Secondary | ICD-10-CM

## 2021-04-19 DIAGNOSIS — E7800 Pure hypercholesterolemia, unspecified: Secondary | ICD-10-CM

## 2021-04-19 NOTE — Telephone Encounter (Signed)
Benefit verification submitted-pending decision. Prolia due after 05/26/21

## 2021-04-27 ENCOUNTER — Other Ambulatory Visit: Payer: Self-pay | Admitting: Family Medicine

## 2021-05-03 NOTE — Telephone Encounter (Signed)
Benefits received. OOP cost is $285 but per prior notes patient has been getting Prolia injection from [facility] and coming here with it to be administered. Left message with patient to see if she is still wanting to go this route and then will send RX. Also need to schedule lab and NV appointment.

## 2021-05-04 NOTE — Addendum Note (Signed)
Addended by: Loura Pardon A on: 05/04/2021 04:34 PM   Modules accepted: Orders

## 2021-05-04 NOTE — Telephone Encounter (Signed)
Spoke with patient. Patient gets Prolia at [facility] and pays $180. Will get this sent in and patient will bring it with her on 05/31/21 for nurse visit.  Dr Glori Bickers, patient is suppose to come on 06/13/21 for CPE labs but since we need to do BMP lab prior to Prolia injection on 05/31/21 patient will come in on 05/23/21 for that draw and patient would like to go ahead and get her CPE labs done at that time also so she does not have to come back again in July.  Please order if that is ok.

## 2021-05-04 NOTE — Telephone Encounter (Signed)
I put the orders in 

## 2021-05-22 MED ORDER — PROLIA 60 MG/ML ~~LOC~~ SOSY: 60.0000 mg | PREFILLED_SYRINGE | SUBCUTANEOUS | 0 refills | Status: DC

## 2021-05-22 NOTE — Addendum Note (Signed)
Addended by: Kris Mouton on: 05/22/2021 02:32 PM   Modules accepted: Orders

## 2021-05-23 ENCOUNTER — Other Ambulatory Visit: Payer: Self-pay

## 2021-05-23 ENCOUNTER — Other Ambulatory Visit (INDEPENDENT_AMBULATORY_CARE_PROVIDER_SITE_OTHER): Payer: PPO

## 2021-05-23 DIAGNOSIS — Z0000 Encounter for general adult medical examination without abnormal findings: Secondary | ICD-10-CM | POA: Diagnosis not present

## 2021-05-23 DIAGNOSIS — E039 Hypothyroidism, unspecified: Secondary | ICD-10-CM

## 2021-05-23 DIAGNOSIS — M818 Other osteoporosis without current pathological fracture: Secondary | ICD-10-CM | POA: Diagnosis not present

## 2021-05-23 DIAGNOSIS — E7800 Pure hypercholesterolemia, unspecified: Secondary | ICD-10-CM

## 2021-05-23 LAB — BASIC METABOLIC PANEL
BUN: 15 mg/dL (ref 6–23)
CO2: 29 mEq/L (ref 19–32)
Calcium: 9.2 mg/dL (ref 8.4–10.5)
Chloride: 105 mEq/L (ref 96–112)
Creatinine, Ser: 0.71 mg/dL (ref 0.40–1.20)
GFR: 81.67 mL/min (ref >60.00)
Glucose, Bld: 85 mg/dL (ref 70–99)
Potassium: 4.2 mEq/L (ref 3.5–5.1)
Sodium: 140 mEq/L (ref 135–145)

## 2021-05-23 LAB — TSH: TSH: 0.17 u[IU]/mL — ABNORMAL LOW (ref 0.35–4.50)

## 2021-05-23 LAB — CBC WITH DIFFERENTIAL/PLATELET
Basophils Absolute: 0 10*3/uL (ref 0.0–0.1)
Basophils Relative: 0.3 % (ref 0.0–3.0)
Eosinophils Absolute: 0.1 10*3/uL (ref 0.0–0.7)
Eosinophils Relative: 1.8 % (ref 0.0–5.0)
HCT: 38.3 % (ref 36.0–46.0)
Hemoglobin: 13.1 g/dL (ref 12.0–15.0)
Lymphocytes Relative: 38.2 % (ref 12.0–46.0)
Lymphs Abs: 2.4 10*3/uL (ref 0.7–4.0)
MCHC: 34.1 g/dL (ref 30.0–36.0)
MCV: 89.6 fl (ref 78.0–100.0)
Monocytes Absolute: 0.6 10*3/uL (ref 0.1–1.0)
Monocytes Relative: 9.4 % (ref 3.0–12.0)
Neutro Abs: 3.2 10*3/uL (ref 1.4–7.7)
Neutrophils Relative %: 50.3 % (ref 43.0–77.0)
Platelets: 159 10*3/uL (ref 150.0–400.0)
RBC: 4.28 Mil/uL (ref 3.87–5.11)
RDW: 13.7 % (ref 11.5–15.5)
WBC: 6.3 10*3/uL (ref 4.0–10.5)

## 2021-05-23 LAB — HEPATIC FUNCTION PANEL
ALT: 10 U/L (ref 0–35)
AST: 18 U/L (ref 0–37)
Albumin: 4.2 g/dL (ref 3.5–5.2)
Alkaline Phosphatase: 45 U/L (ref 39–117)
Bilirubin, Direct: 0.2 mg/dL (ref 0.0–0.3)
Total Bilirubin: 0.8 mg/dL (ref 0.2–1.2)
Total Protein: 6.2 g/dL (ref 6.0–8.3)

## 2021-05-23 LAB — LIPID PANEL
Cholesterol: 148 mg/dL (ref 0–200)
HDL: 59.9 mg/dL (ref >39.00)
LDL Cholesterol: 71 mg/dL (ref 0–99)
NonHDL: 88.35
Total CHOL/HDL Ratio: 2
Triglycerides: 87 mg/dL (ref 0.0–149.0)
VLDL: 17.4 mg/dL (ref 0.0–40.0)

## 2021-05-23 LAB — VITAMIN D 25 HYDROXY (VIT D DEFICIENCY, FRACTURES): VITD: 44.43 ng/mL (ref 30.00–100.00)

## 2021-05-29 NOTE — Telephone Encounter (Signed)
CrCl is 66.18 mL/min . Calcium 9.2-normal- 05/23/21 Ok to proceed with Prolia injection

## 2021-05-31 ENCOUNTER — Other Ambulatory Visit: Payer: Self-pay

## 2021-05-31 ENCOUNTER — Ambulatory Visit (INDEPENDENT_AMBULATORY_CARE_PROVIDER_SITE_OTHER): Payer: PPO

## 2021-05-31 DIAGNOSIS — M818 Other osteoporosis without current pathological fracture: Secondary | ICD-10-CM | POA: Diagnosis not present

## 2021-05-31 MED ORDER — DENOSUMAB 60 MG/ML ~~LOC~~ SOSY
60.0000 mg | PREFILLED_SYRINGE | Freq: Once | SUBCUTANEOUS | Status: AC
  Administered 2021-05-31: 60 mg via SUBCUTANEOUS

## 2021-05-31 NOTE — Progress Notes (Signed)
Patient presented for 6-month Prolia injection SQ to left arm given by Alaric Gladwin, CMA. Patient tolerated injection well. 

## 2021-06-06 ENCOUNTER — Telehealth: Payer: Self-pay

## 2021-06-06 NOTE — Telephone Encounter (Signed)
Patient called states that she is concerned with her TSH being low. States that over the weekend she had episode where she became very dizzy and weak when at her son's house. She states that she if very tired and feels. "Nervous" on the inside. Patient had a CPE appointment on 7/12 but was changed to later in the month. Labs state that you were going to review at CPE appointment. I have made an office visit with Dr. Glori Bickers on Friday to review labs and evaluation for symptoms. Wanted to make sure no further action was needed.

## 2021-06-06 NOTE — Telephone Encounter (Signed)
She takes 100 mcg of levothyroxine  Will change to 75  Please send in levothyroxine 75 mcg once daly #30 3 ref  It will take a while for the TSH to normalize (I usually wait about 6 wk to re check)  If she wants to cancel that appt that is fine (up to her)

## 2021-06-06 NOTE — Telephone Encounter (Signed)
[facility] Night - Client Nonclinical Telephone Record  [facility] Client [place] Night - Client Client Site [facility] Physician Loura Pardon - MD Contact Type Call Who Is Calling Patient / Member / Family / Caregiver Caller Name [place] Phone Number [phone] Patient Name Gabrielle Webb Patient DOB [birth year] Call Type Message Only Information Provided Reason for Call Request for General Office Information Initial Comment Caller states that she would like a message sent to Dr. Alba Cory. Yesterday she was lightheaded, shakiness. She is wondering if she needs to have her thyroid medication changed. Caller is declining triage. Additional Comment Caller declined triage, office hours provided. Disp. Time Disposition Final User 06/05/2021 11:23:58 AM General Information Provided Yes Perla-Benitez, Jocelyn Call Closed By: Claris Gladden Transaction Date/Time: 06/05/2021 11:19:06 AM (ET)

## 2021-06-07 MED ORDER — LEVOTHYROXINE SODIUM 75 MCG PO TABS: 75.0000 ug | ORAL_TABLET | Freq: Every day | ORAL | 3 refills | Status: DC

## 2021-06-07 NOTE — Telephone Encounter (Signed)
Rx sent to pharmacy. Pt notified of Dr. Marliss Coots comments.   Pt is going to keep her appt. on Friday, she thinks given her thyroid issues she needs a referral to Endo, she wants to go to [facility] and see Dr. Marcello Moores L. O'Connell.   Also pt said she is having anxiety and wants to discuss starting lexapro 5mg   She said she is having issues with anxiety, lightheaded, fatigue, shaky, and foggy headed, I did explain that some of these sxs may start to get better once she is on the right dose of thyroid med but given sxs and that she wants to discuss starting lexapro and discuss a referral to endo that she should keep appt to discuss everything with PCP this Friday. Pt verbalized understanding. Pt will keep appt and discuss her issues and concerns directly with Dr. Glori Bickers.  FYI to PCP

## 2021-06-07 NOTE — Addendum Note (Signed)
Addended by: Tammi Sou on: 06/07/2021 12:19 PM   Modules accepted: Orders

## 2021-06-09 ENCOUNTER — Other Ambulatory Visit: Payer: Self-pay

## 2021-06-09 ENCOUNTER — Encounter: Payer: Self-pay | Admitting: Family Medicine

## 2021-06-09 ENCOUNTER — Ambulatory Visit (INDEPENDENT_AMBULATORY_CARE_PROVIDER_SITE_OTHER): Payer: PPO | Admitting: Family Medicine

## 2021-06-09 VITALS — BP 126/74 | HR 64 | Temp 96.9°F | Ht 63.0 in | Wt 138.2 lb

## 2021-06-09 DIAGNOSIS — E039 Hypothyroidism, unspecified: Secondary | ICD-10-CM | POA: Diagnosis not present

## 2021-06-09 DIAGNOSIS — F4322 Adjustment disorder with anxiety: Secondary | ICD-10-CM | POA: Insufficient documentation

## 2021-06-09 DIAGNOSIS — R42 Dizziness and giddiness: Secondary | ICD-10-CM | POA: Diagnosis not present

## 2021-06-09 MED ORDER — ESCITALOPRAM OXALATE 5 MG PO TABS: 5.0000 mg | ORAL_TABLET | Freq: Every day | ORAL | 11 refills | Status: DC

## 2021-06-09 NOTE — Progress Notes (Signed)
Subjective:    Patient ID: Gabrielle Webb, female    DOB: March 17, 1941, 80 y.o.   MRN: [id number]  This visit occurred during the SARS-CoV-2 public health emergency.  Safety protocols were in place, including screening questions prior to the visit, additional usage of staff PPE, and extensive cleaning of exam room while observing appropriate contact time as indicated for disinfecting solutions.   HPI Pt presents with multiple symptoms including anxiety, dizzy, fatigue and foggy headed  Wt Readings from Last 3 Encounters:  06/09/21 138 lb 3 oz (62.7 kg)  11/02/20 141 lb 7 oz (64.2 kg)  08/15/20 139 lb 9.6 oz (63.3 kg)   24.48 kg/m  Feels poorly 20mo Tired  Wants to sleep all the time   Got light headed this weekend outdoors bending over Hands were shaky  Thinks from thyroid   Stress Mother is in nsg care- has to go daily and solve a lot of problems  She extends herself too much   Used to walk  With hot weather-not as much  Does some walking and aerobics in the home     Hypothyroid Lab Results  Component Value Date   TSH 0.17 (L) 05/23/2021   Has really struggled with this  Wants a referral to endocrinology  We did cut her thyroid dose  Jittery and anxious  Shaky    Patient Active Problem List   Diagnosis Date Noted   Light headed 06/10/2021   Anxious mood as adjustment reaction 06/09/2021   Hematuria 11/02/2020   Breast lump on right side at 8 o'clock position 05/03/2020   History of breast cancer 05/03/2020   Medicare annual wellness visit, subsequent 05/28/2018   Estrogen deficiency 05/28/2018   Fatigue 05/22/2017   Chest discomfort 05/22/2017   S/P TAH-BSO 11/08/2016   Routine general medical examination at a health care facility 05/06/2016   Malignant ne[place] of upper-outer quadrant of right breast in female, estrogen receptor positive [place] 03/21/2015   History of tamoxifen therapy 05/18/2014   Encounter for routine gynecological examination  05/06/2013   Colon cancer screening 05/06/2013   Encounter for Medicare annual wellness exam 04/28/2013   Hx of radiation therapy    Gynecological examination 12/25/2011   Hx of Breast cancer, stage 1, Right, UOQ, Receptor+,Her2- 05/29/2011    Class: Stage 1   Other screening mammogram 03/21/2011   Post-menopausal 03/21/2011   Hypothyroidism 01/23/2008   Pure hypercholesterolemia 01/23/2008   ALLERGIC RHINITIS 01/23/2008   GERD 01/23/2008   DIVERTICULOSIS, COLON 01/23/2008   IBS 01/23/2008   FIBROCYSTIC BREAST DISEASE 01/23/2008   Osteoporosis 10/17/2007   Past Medical History:  Diagnosis Date   Allergy    Anxiety    Breast [place] [place]   Right   Cellulitis    Diverticulosis of colon    GERD (gastroesophageal reflux disease)    Headache    sinus   History of hiatal hernia    Hx of radiation therapy 06/20/11 - 07/11/11   right breast   Hypothyroid    Osteoporosis    Personal history of radiation therapy 2012   Right Breast Cancer   PONV (postoperative nausea and vomiting)    Skin cancer    basal and squamous cell   Vertigo    Past Surgical History:  Procedure Laterality Date   ABDOMINAL HYSTERECTOMY N/A 11/08/2016   Procedure: HYSTERECTOMY TOTAL  ABDOMINAL;  Surgeon: MDian Queen MD;  [facility];  Service: Gynecology;  Laterality: N/A;   APPENDECTOMY  07/2007  back sugery  1970   BREAST BIOPSY Left    Benign   BREAST EXCISIONAL BIOPSY Left 1983   BREAST EXCISIONAL BIOPSY Left 1983   BREAST LUMPECTOMY Right 05/16/2011   COLONOSCOPY     COLONOSCOPY WITH PROPOFOL N/A 08/27/2018   Procedure: COLONOSCOPY WITH PROPOFOL;  Surgeon: Manya Silvas, MD;  Location[facility] ENDOSCOPY;  Service: Endoscopy;  Laterality: N/A;   DILATION AND CURETTAGE OF UTERUS N/A 05/31/2014   Procedure: DILATATION AND CURETTAGE WITH ULTRASOUND GUIDANCE;  Surgeon: Cyril Mourning, MD;  Location: [facility];  Service: Gynecology;  Laterality: N/A;   KNEE SURGERY  1962   right eye  surgery     SALPINGOOPHORECTOMY Bilateral 11/08/2016   Procedure: BILATERAL SALPINGO OOPHORECTOMY;  Surgeon: Dian Queen, MD;  Location: [facility];  Service: Gynecology;  Laterality: Bilateral;   UPPER GI ENDOSCOPY     Social History   Tobacco Use   Smoking status: Never   Smokeless tobacco: Never  Vaping Use   Vaping Use: Never used  Substance Use Topics   Alcohol use: No    Alcohol/week: 0.0 standard drinks   Drug use: No   Family History  Problem Relation Age of Onset   Heart attack Father 37   Hypertension Father    Atrial fibrillation Mother    Coronary artery disease Mother    Diabetes Other        Grandmother   Coronary artery disease Other        Grandmother   Uterine cancer Other        Grandmother   Leukemia Other        Aunt   Cancer Maternal Aunt        leukemia   Cancer Maternal Uncle        colon   Allergies  Allergen Reactions   Codeine Nausea And Vomiting   Fentanyl Nausea And Vomiting   Hydromorphone Nausea And Vomiting        Morphine Nausea And Vomiting   Ciprofloxacin     Intolerant- tingling    Tape     Surgical tape   Prednisone Palpitations    Rapid hear beat   Current Outpatient Medications on File Prior to Visit  Medication Sig Dispense Refill   aspirin 81 MG tablet Take 81 mg by mouth daily.     atorvastatin (LIPITOR) 10 MG tablet TAKE ONE TABLET BY MOUTH EVERY DAY 90 tablet 0   BIOTIN PO Take by mouth daily.     CALCIUM PO Take 1,500 mg by mouth daily.     chlorthalidone (HYGROTON) 50 MG tablet TAKE 1 TABLET BY MOUTH DAILY AS NEEDED FOR SWELLING 90 tablet 2   Cholecalciferol (VITAMIN D) 2000 UNITS CAPS Take 1 capsule by mouth daily.     Cyanocobalamin (B-12 PO) Take 2,500 mcg by mouth daily.     denosumab (PROLIA) 60 MG/ML SOSY injection Inject 60 mg into the skin every 6 (six) months. 1 mL 0   diclofenac sodium (VOLTAREN) 1 % GEL Apply 2 g topically 4 (four) times daily. Rub into affected area of foot 2 to 4 times daily (Patient  taking differently: Apply 2 g topically as needed. Rub into affected area of foot 2 to 4 times daily) 100 g 2   dicyclomine (BENTYL) 10 MG capsule TAKE 1 CAPSULE BY MOUTH TWICE DAILY FOR SPASMS 180 capsule 0   famotidine (PEPCID) 20 MG tablet TAKE ONE TABLET EVERY DAY AS NEEDED FOR HEARTBURN OR INDIGESTION 90 tablet 0  fluticasone (FLONASE) 50 MCG/ACT nasal spray TWO SPRAYS IN EACH NOSTRIL EVERY DAY 18 g 11   levothyroxine (SYNTHROID) 75 MCG tablet Take 1 tablet (75 mcg total) by mouth daily. 30 tablet 3   Multiple Vitamin (MULTIVITAMIN) capsule Take 1 capsule by mouth daily.     Naproxen Sodium (ALEVE PO) Take 1 tablet by mouth as needed.     niacin 500 MG tablet Take 1,000 mg by mouth at bedtime.     Omega-3 Fatty Acids (FISH OIL PO) Take 1 capsule by mouth daily.     OVER THE COUNTER MEDICATION Take 1 tablet by mouth daily as needed (allergies). Over the counter allergy medication     No current facility-administered medications on file prior to visit.    Review of Systems  Constitutional:  Positive for fatigue. Negative for activity change, appetite change, fever and unexpected weight change.  HENT:  Negative for congestion, ear pain, rhinorrhea, sinus pressure and sore throat.   Eyes:  Negative for pain, redness and visual disturbance.  Respiratory:  Negative for cough, shortness of breath and wheezing.   Cardiovascular:  Negative for chest pain and palpitations.  Gastrointestinal:  Negative for abdominal pain, blood in stool, constipation and diarrhea.  Endocrine: Negative for polydipsia and polyuria.  Genitourinary:  Negative for dysuria, frequency and urgency.  Musculoskeletal:  Negative for arthralgias, back pain and myalgias.  Skin:  Negative for pallor and rash.  Allergic/Immunologic: Negative for environmental allergies.  Neurological:  Positive for light-headedness. Negative for dizziness, tremors, syncope, facial asymmetry, weakness, numbness and headaches.  Hematological:   Negative for adenopathy. Does not bruise/bleed easily.  Psychiatric/Behavioral:  Positive for dysphoric mood and sleep disturbance. Negative for agitation, confusion and decreased concentration. The patient is nervous/anxious.       Objective:   Physical Exam Constitutional:      General: She is not in acute distress.    Appearance: Normal appearance. She is well-developed and normal weight. She is not ill-appearing or diaphoretic.  HENT:     Head: Normocephalic and atraumatic.  Eyes:     Conjunctiva/sclera: Conjunctivae normal.     Pupils: Pupils are equal, round, and reactive to light.  Neck:     Thyroid: No thyromegaly.     Vascular: No carotid bruit or JVD.  Cardiovascular:     Rate and Rhythm: Normal rate and regular rhythm.     Heart sounds: Normal heart sounds.    No gallop.  Pulmonary:     Effort: Pulmonary effort is normal. No respiratory distress.     Breath sounds: Normal breath sounds. No wheezing or rales.  Abdominal:     General: Bowel sounds are normal. There is no distension or abdominal bruit.     Palpations: Abdomen is soft. There is no mass.     Tenderness: There is no abdominal tenderness.  Musculoskeletal:     Cervical back: Normal range of motion and neck supple.     Right lower leg: No edema.     Left lower leg: No edema.  Lymphadenopathy:     Cervical: No cervical adenopathy.  Skin:    General: Skin is warm and dry.     Coloration: Skin is not pale.     Findings: No rash.  Neurological:     Mental Status: She is alert.     Cranial Nerves: Cranial nerves are intact.     Sensory: Sensation is intact.     Motor: Motor function is intact. No tremor or pronator drift.  Coordination: Coordination is intact. Coordination normal.     Gait: Gait is intact.     Deep Tendon Reflexes: Reflexes are normal and symmetric. Reflexes normal.  Psychiatric:        Attention and Perception: Attention normal.        Mood and Affect: Mood is anxious.         Speech: Speech normal.        Behavior: Behavior normal.        Thought Content: Thought content normal.        Cognition and Memory: Cognition and memory normal.     Comments: Anxious  Pleasant  Discusses stressors and symptoms candidly          Assessment & Plan:   Problem List Items Addressed This Visit       Endocrine   Hypothyroidism    TSH is low and pt feels jittery and also tired Lab Results  Component Value Date   TSH 0.17 (L) 05/23/2021   Levothyroxine dose cut to 75 mcg  No goiter req ref to endocrinology -done       Relevant Orders   Ambulatory referral to Endocrinology     Other   Anxious mood as adjustment reaction - Primary    Pt is unsure how much of her symptoms are due to stress/anxiety or thyroid imbalance  Not as much exercise due to the heat  Reviewed stressors/ coping techniques/symptoms/ support sources/ tx options and side effects in detail today  Disc imp of self care and fluids  Disc options for indoor exercise  Ref made for counseling for stress reaction  Disc use of medicine-she is interested in low dose lexapro (5 mg)  Discussed expectations of SSRI medication including time to effectiveness and mechanism of action, also poss of side effects (early and late)- including mental fuzziness, weight or appetite change, nausea and poss of worse dep or anxiety (even suicidal thoughts)  Pt voiced understanding and will stop med and update if this occurs  F/u planned        Relevant Orders   Ambulatory referral to Psychology   Light headed    Episode of light headedness outdoors may have been multi factorial Heat/ ? Dehydration/bending over may have prompted it  Fatigue and anxiety also  Will plan on close obs and f/u  Disc imp of changing position slowly and taking breaks from the heat and staying hydrated ER parameters discussed

## 2021-06-09 NOTE — Patient Instructions (Addendum)
Take care of yourself   I will place a referral to endocrinology Also for counseling  You will get calls   Start lexapro when you feel ready  If any intolerable side effects or if you feel worse stop it

## 2021-06-10 DIAGNOSIS — R42 Dizziness and giddiness: Secondary | ICD-10-CM | POA: Insufficient documentation

## 2021-06-10 NOTE — Assessment & Plan Note (Signed)
Pt is unsure how much of her symptoms are due to stress/anxiety or thyroid imbalance  Not as much exercise due to the heat  Reviewed stressors/ coping techniques/symptoms/ support sources/ tx options and side effects in detail today  Disc imp of self care and fluids  Disc options for indoor exercise  Ref made for counseling for stress reaction  Disc use of medicine-she is interested in low dose lexapro (5 mg)  Discussed expectations of SSRI medication including time to effectiveness and mechanism of action, also poss of side effects (early and late)- including mental fuzziness, weight or appetite change, nausea and poss of worse dep or anxiety (even suicidal thoughts)  Pt voiced understanding and will stop med and update if this occurs  F/u planned

## 2021-06-10 NOTE — Assessment & Plan Note (Signed)
Episode of light headedness outdoors may have been multi factorial Heat/ ? Dehydration/bending over may have prompted it  Fatigue and anxiety also  Will plan on close obs and f/u  Disc imp of changing position slowly and taking breaks from the heat and staying hydrated ER parameters discussed

## 2021-06-10 NOTE — Assessment & Plan Note (Signed)
TSH is low and pt feels jittery and also tired Lab Results  Component Value Date   TSH 0.17 (L) 05/23/2021   Levothyroxine dose cut to 75 mcg  No goiter req ref to endocrinology -done

## 2021-06-13 ENCOUNTER — Other Ambulatory Visit: Payer: PPO

## 2021-06-15 DIAGNOSIS — D2261 Melanocytic nevi of right upper limb, including shoulder: Secondary | ICD-10-CM | POA: Diagnosis not present

## 2021-06-15 DIAGNOSIS — D2272 Melanocytic nevi of left lower limb, including hip: Secondary | ICD-10-CM | POA: Diagnosis not present

## 2021-06-15 DIAGNOSIS — C44629 Squamous cell carcinoma of skin of left upper limb, including shoulder: Secondary | ICD-10-CM | POA: Diagnosis not present

## 2021-06-15 DIAGNOSIS — R58 Hemorrhage, not elsewhere classified: Secondary | ICD-10-CM | POA: Diagnosis not present

## 2021-06-15 DIAGNOSIS — L538 Other specified erythematous conditions: Secondary | ICD-10-CM | POA: Diagnosis not present

## 2021-06-15 DIAGNOSIS — D485 Neoplasm of uncertain behavior of skin: Secondary | ICD-10-CM | POA: Diagnosis not present

## 2021-06-15 DIAGNOSIS — L570 Actinic keratosis: Secondary | ICD-10-CM | POA: Diagnosis not present

## 2021-06-15 DIAGNOSIS — Z85828 Personal history of other malignant neoplasm of skin: Secondary | ICD-10-CM | POA: Diagnosis not present

## 2021-06-15 DIAGNOSIS — L821 Other seborrheic keratosis: Secondary | ICD-10-CM | POA: Diagnosis not present

## 2021-06-15 DIAGNOSIS — C44719 Basal cell carcinoma of skin of left lower limb, including hip: Secondary | ICD-10-CM | POA: Diagnosis not present

## 2021-06-15 DIAGNOSIS — D2262 Melanocytic nevi of left upper limb, including shoulder: Secondary | ICD-10-CM | POA: Diagnosis not present

## 2021-06-15 DIAGNOSIS — L239 Allergic contact dermatitis, unspecified cause: Secondary | ICD-10-CM | POA: Diagnosis not present

## 2021-06-21 ENCOUNTER — Telehealth: Payer: Self-pay

## 2021-06-21 NOTE — Telephone Encounter (Signed)
Brought to this nurse's attention that on pt apt note it reported pt exposure to covid. So pt apt for tomorrow was changed to VV by staff earlier today.   Contacted pt who reports she was exposed a neighbor on 3 days ago when they were sitting outside in the driveway without masks just talking for about 2 hours. They were about 5 feet away from each other. Pt denies any current symptoms and reports neighbor tested positive today. Advised pt her apt is supposed to be a physical so it will need to be RS and there is no need for a VV if she is not having any symptoms and not covid positive. She agreed and her CPE was RS for 07/03/21. Pt appreciative for the call. Advised if she developed any symptoms or tested positive to contact the office. Pt said she is going to test tomorrow. Advised pt of ER precautions. Pt verbalized understanding

## 2021-06-22 ENCOUNTER — Ambulatory Visit: Payer: PPO | Admitting: Family Medicine

## 2021-06-22 DIAGNOSIS — Z20822 Contact with and (suspected) exposure to covid-19: Secondary | ICD-10-CM | POA: Diagnosis not present

## 2021-06-22 DIAGNOSIS — Z03818 Encounter for observation for suspected exposure to other biological agents ruled out: Secondary | ICD-10-CM | POA: Diagnosis not present

## 2021-06-24 ENCOUNTER — Other Ambulatory Visit: Payer: Self-pay | Admitting: Family Medicine

## 2021-06-26 NOTE — Telephone Encounter (Signed)
Last OV was on 06/09/21, last time [place] was prescribed was 11/07/18 and it was 50 mg not 25 mg as refill request indicates, please advise

## 2021-06-26 NOTE — Telephone Encounter (Signed)
Please clarify with pt-what she is taking it for and how much?  thanks

## 2021-06-28 NOTE — Telephone Encounter (Signed)
Pt said she takes it very rarely for swelling, she likes to keep it on hand just in case and she realized the Rx she had expired so that's why she requested the refill.  [facility]

## 2021-06-29 ENCOUNTER — Telehealth: Payer: Self-pay

## 2021-06-29 NOTE — Telephone Encounter (Signed)
Pt said she has appt on 07/03/21 with Dr Glori Bickers for her annual exam. Pt said in July levothyroxine dosage was changed to 75 mcg daily. Pt has 7 pills left and wants to know if she should have labs prior to appt. [place] CMA advised that if pt needs labs Dr Glori Bickers will do at appt because thyroid testing is not fasting. Pt will continue taking her meds as directed and I advised that Dr Glori Bickers did give pt refills on the levothyroxine 75 mcg. Pt voiced understanding and appreciative. Pt wanted Dr Glori Bickers to know the first available appt with endo was 08/08/21, sending note to DR Glori Bickers and [place] CMA.

## 2021-07-03 ENCOUNTER — Encounter: Payer: Self-pay | Admitting: Family Medicine

## 2021-07-03 ENCOUNTER — Other Ambulatory Visit: Payer: Self-pay

## 2021-07-03 ENCOUNTER — Ambulatory Visit (INDEPENDENT_AMBULATORY_CARE_PROVIDER_SITE_OTHER): Payer: PPO | Admitting: Family Medicine

## 2021-07-03 VITALS — BP 112/66 | HR 67 | Temp 97.5°F | Ht 63.0 in | Wt 138.0 lb

## 2021-07-03 DIAGNOSIS — E039 Hypothyroidism, unspecified: Secondary | ICD-10-CM

## 2021-07-03 DIAGNOSIS — Z0000 Encounter for general adult medical examination without abnormal findings: Secondary | ICD-10-CM

## 2021-07-03 DIAGNOSIS — M818 Other osteoporosis without current pathological fracture: Secondary | ICD-10-CM

## 2021-07-03 DIAGNOSIS — Z1211 Encounter for screening for malignant neoplasm of colon: Secondary | ICD-10-CM | POA: Diagnosis not present

## 2021-07-03 DIAGNOSIS — F4322 Adjustment disorder with anxiety: Secondary | ICD-10-CM

## 2021-07-03 DIAGNOSIS — E7800 Pure hypercholesterolemia, unspecified: Secondary | ICD-10-CM | POA: Diagnosis not present

## 2021-07-03 DIAGNOSIS — Z853 Personal history of malignant neoplasm of breast: Secondary | ICD-10-CM | POA: Diagnosis not present

## 2021-07-03 LAB — TSH: TSH: 0.88 u[IU]/mL (ref 0.35–5.50)

## 2021-07-03 MED ORDER — ATORVASTATIN CALCIUM 10 MG PO TABS: 10.0000 mg | ORAL_TABLET | Freq: Every day | ORAL | 3 refills | Status: DC

## 2021-07-03 MED ORDER — FAMOTIDINE 20 MG PO TABS: ORAL_TABLET | ORAL | 3 refills | Status: DC

## 2021-07-03 NOTE — Assessment & Plan Note (Signed)
Some improvement with lexapro 5 mg daily  Pt changed mind about counseling and does not want to do it  Stress level is high - cares for mother in her 43s  Hopes when thyroid levels out it will help mood

## 2021-07-03 NOTE — Assessment & Plan Note (Signed)
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

## 2021-07-03 NOTE — Assessment & Plan Note (Signed)
Disc goals for lipids and reasons to control them Rev last labs with pt Rev low sat fat diet in detail Taking atorvastatin  LDL of 71  Well controlled

## 2021-07-03 NOTE — Assessment & Plan Note (Signed)
Past tamoxifen  No re occurrence

## 2021-07-03 NOTE — Assessment & Plan Note (Signed)
dexa 8/21 rev  Taking prolia (past tamoxifen) No falls or fx  Taking ca and D with therapeutic D level   Enc her to keep walking

## 2021-07-03 NOTE — Progress Notes (Signed)
Subjective:    Patient ID: Gabrielle Webb, female    DOB: [birth year], 80 y.o.   MRN: [id number]  This visit occurred during the SARS-CoV-2 public health emergency.  Safety protocols were in place, including screening questions prior to the visit, additional usage of staff PPE, and extensive cleaning of exam room while observing appropriate contact time as indicated for disinfecting solutions.   HPI Pt presents for amw and health mt exam with rev of chronic medical problems   I have personally reviewed the Medicare Annual Wellness questionnaire and have noted 1. The patient's medical and social history 2. Their use of alcohol, tobacco or illicit drugs 3. Their current medications and supplements 4. The patient's functional ability including ADL's, fall risks, home safety risks and hearing or visual             impairment. 5. Diet and physical activities 6. Evidence for depression or mood disorders  The patients weight, height, BMI have been recorded in the chart and visual acuity is per eye clinic.  I have made referrals, counseling and provided education to the patient based review of the above and I have provided the pt with a written personalized care plan for preventive services. Reviewed and updated provider list, see scanned forms.  See scanned forms.  Routine anticipatory guidance given to patient.  See health maintenance. Colon cancer screening  colonoscopy 8/19, she has appt with Dr Fleet Contras as a new pt and planning a colonoscopy  Breast cancer screening  mammogram 8/21 and scheduled for later this mo Personal h/o breast cancer with tamoxifen Self breast exam-no lumps or changes  Had a pap with Dr Jinger Neighbors last year/she has appt this month  Flu vaccine-fall Covid vaccinated Tetanus vaccine 1/19 Tdap Pneumovax completed Zoster vaccine-had shingrix  Dexa 8/21 Osteoporosis  Past tx with tamoxifen Taking prolia  Falls-none  Fractures-none  Supplements ca and D , D  level is 44.4  Exercise -walking /some aerobics at home   Advance directive-has it utd  Cognitive function addressed- see scanned forms- and if abnormal then additional documentation follows.   No concerns Runs household  Occ misplaces  No confusion and does not get lost  Does word searches   PMH and SH reviewed  Meds, vitals, and allergies reviewed.   ROS: See HPI.  Otherwise negative.    Weight : Wt Readings from Last 3 Encounters:  07/03/21 138 lb (62.6 kg)  06/09/21 138 lb 3 oz (62.7 kg)  11/02/20 141 lb 7 oz (64.2 kg)   24.45 kg/m   Hearing/vision: Hearing Screening   '500Hz'  '1000Hz'  '2000Hz'  '4000Hz'   Right ear 40 40 40 0  Left ear 0 0 0 0  Vision Screening - Comments:: Eye exam with Dr. Frederik Pear at [facility] in 11/2020 Long term hearing loss in L ear (permanent)  Sees Dr Pryor Ochoa ENT- watches her hearing  Occ sinusitis  R ear has hurt lately - uses flonase    Has taken cefdinir for sinus infection in the past   BP Readings from Last 3 Encounters:  07/03/21 112/66  06/09/21 126/74  03/30/21 108/72   Pulse Readings from Last 3 Encounters:  07/03/21 67  06/09/21 64  03/30/21 76     Care team  Kynzli Rease-pcp Magrinat-onc Grewal-gyn Richardson Landry- ENT Gollan-cardiol  Hypothyroidism  Pt has no clinical changes  Lab Results  Component Value Date   TSH 0.17 (L) 05/23/2021     Dose was cut to 75 mcg daily and ref was  made to endocrinology for thyroid symptoms   Mood/anxiety  Last visit ref made for counseling  Px low dose lexapro (5 mg ) This has helped  She declines counseling-blames it on thyroid problems    Hyperlipidemia Lab Results  Component Value Date   CHOL 148 05/23/2021   CHOL 175 06/13/2020   CHOL 169 06/03/2019   Lab Results  Component Value Date   HDL 59.90 05/23/2021   HDL 59.90 06/13/2020   HDL 62.20 06/03/2019   Lab Results  Component Value Date   LDLCALC 71 05/23/2021   LDLCALC 94 06/13/2020   LDLCALC 87 06/03/2019    Lab Results  Component Value Date   TRIG 87.0 05/23/2021   TRIG 105.0 06/13/2020   TRIG 96.0 06/03/2019   Lab Results  Component Value Date   CHOLHDL 2 05/23/2021   CHOLHDL 3 06/13/2020   CHOLHDL 3 06/03/2019   Lab Results  Component Value Date   LDLDIRECT 142.0 03/19/2011   LDLDIRECT 125.4 12/08/2009   LDLDIRECT 172.0 09/19/2009  Atorvastatin 10 mg daily  Eating well  LDL is down further   Lab Results  Component Value Date   WBC 6.3 05/23/2021   HGB 13.1 05/23/2021   HCT 38.3 05/23/2021   MCV 89.6 05/23/2021   PLT 159.0 05/23/2021   Lab Results  Component Value Date   ALT 10 05/23/2021   AST 18 05/23/2021   ALKPHOS 45 05/23/2021   BILITOT 0.8 05/23/2021   Lab Results  Component Value Date   CREATININE 0.71 05/23/2021   BUN 15 05/23/2021   NA 140 05/23/2021   K 4.2 05/23/2021   CL 105 05/23/2021   CO2 29 05/23/2021   Patient Active Problem List   Diagnosis Date Noted   Light headed 06/10/2021   Anxious mood as adjustment reaction 06/09/2021   Hematuria 11/02/2020   Breast lump on right side at 8 o'clock position 05/03/2020   History of breast cancer 05/03/2020   Medicare annual wellness visit, subsequent 05/28/2018   Estrogen deficiency 05/28/2018   Fatigue 05/22/2017   Chest discomfort 05/22/2017   S/P TAH-BSO 11/08/2016   Routine general medical examination at a health care facility 05/06/2016   Malignant neoplasm of upper-outer quadrant of right breast in female, estrogen receptor positive ([place]) 03/21/2015   History of tamoxifen therapy 05/18/2014   Encounter for routine gynecological examination 05/06/2013   Colon cancer screening 05/06/2013   Encounter for Medicare annual wellness exam 04/28/2013   Hx of radiation therapy    Gynecological examination 12/25/2011   Hx of Breast cancer, stage 1, Right, UOQ, Receptor+,Her2- 05/29/2011    Class: Stage 1   Other screening mammogram 03/21/2011   Post-menopausal 03/21/2011   Hypothyroidism  01/23/2008   Pure hypercholesterolemia 01/23/2008   ALLERGIC RHINITIS 01/23/2008   GERD 01/23/2008   DIVERTICULOSIS, COLON 01/23/2008   IBS 01/23/2008   FIBROCYSTIC BREAST DISEASE 01/23/2008   Osteoporosis 10/17/2007   Past Medical History:  Diagnosis Date   Allergy    Anxiety    Breast cancer ([place]) 7/12   Right   Cellulitis    Diverticulosis of colon    GERD (gastroesophageal reflux disease)    Headache    sinus   History of hiatal hernia    Hx of radiation therapy 06/20/11 - 07/11/11   right breast   Hypothyroid    Osteoporosis    Personal history of radiation therapy 2012   Right Breast Cancer   PONV (postoperative nausea and vomiting)    Skin  cancer    basal and squamous cell   Vertigo    Past Surgical History:  Procedure Laterality Date   ABDOMINAL HYSTERECTOMY N/A 11/08/2016   Procedure: HYSTERECTOMY TOTAL  ABDOMINAL;  Surgeon: Dian Queen, MD;  Location: [facility];  Service: Gynecology;  Laterality: N/A;   APPENDECTOMY  07/2007   back sugery  1970   BREAST BIOPSY Left    Benign   BREAST EXCISIONAL BIOPSY Left 1983   BREAST EXCISIONAL BIOPSY Left 1983   BREAST LUMPECTOMY Right 05/16/2011   COLONOSCOPY     COLONOSCOPY WITH PROPOFOL N/A 08/27/2018   Procedure: COLONOSCOPY WITH PROPOFOL;  Surgeon: Manya Silvas, MD;  Location: [facility] ENDOSCOPY;  Service: Endoscopy;  Laterality: N/A;   DILATION AND CURETTAGE OF UTERUS N/A 05/31/2014   Procedure: DILATATION AND CURETTAGE WITH ULTRASOUND GUIDANCE;  Surgeon: Cyril Mourning, MD;  Location: [facility];  Service: Gynecology;  Laterality: N/A;   KNEE SURGERY  1962   right eye surgery     SALPINGOOPHORECTOMY Bilateral 11/08/2016   Procedure: BILATERAL SALPINGO OOPHORECTOMY;  Surgeon: Dian Queen, MD;  Location: [facility];  Service: Gynecology;  Laterality: Bilateral;   UPPER GI ENDOSCOPY     Social History   Tobacco Use   Smoking status: Never   Smokeless tobacco: Never  Vaping Use   Vaping Use: Never used  Substance  Use Topics   Alcohol use: No    Alcohol/week: 0.0 standard drinks   Drug use: No   Family History  Problem Relation Age of Onset   Heart attack Father 43   Hypertension Father    Atrial fibrillation Mother    Coronary artery disease Mother    Diabetes Other        Grandmother   Coronary artery disease Other        Grandmother   Uterine cancer Other        Grandmother   Leukemia Other        Aunt   Cancer Maternal Aunt        leukemia   Cancer Maternal Uncle        colon   Allergies  Allergen Reactions   Codeine Nausea And Vomiting   Fentanyl Nausea And Vomiting   Hydromorphone Nausea And Vomiting        Morphine Nausea And Vomiting   Ciprofloxacin     Intolerant- tingling    Tape     Surgical tape   Prednisone Palpitations    Rapid hear beat   Current Outpatient Medications on File Prior to Visit  Medication Sig Dispense Refill   aspirin 81 MG tablet Take 81 mg by mouth daily.     BIOTIN PO Take by mouth daily.     CALCIUM PO Take 1,500 mg by mouth daily.     chlorthalidone (HYGROTON) 25 MG tablet TAKE TWO TABLETS EVERY DAY AS NEEDED FORSWELLING 180 tablet 0   Cholecalciferol (VITAMIN D) 2000 UNITS CAPS Take 1 capsule by mouth daily.     Cyanocobalamin (B-12 PO) Take 2,500 mcg by mouth daily.     denosumab (PROLIA) 60 MG/ML SOSY injection Inject 60 mg into the skin every 6 (six) months. 1 mL 0   diclofenac sodium (VOLTAREN) 1 % GEL Apply 2 g topically 4 (four) times daily. Rub into affected area of foot 2 to 4 times daily (Patient taking differently: Apply 2 g topically as needed. Rub into affected area of foot 2 to 4 times daily) 100 g 2   dicyclomine (BENTYL)  10 MG capsule TAKE 1 CAPSULE BY MOUTH TWICE DAILY FOR SPASMS 180 capsule 0   escitalopram (LEXAPRO) 5 MG tablet Take 1 tablet (5 mg total) by mouth daily. 30 tablet 11   fluticasone (FLONASE) 50 MCG/ACT nasal spray TWO SPRAYS IN EACH NOSTRIL EVERY DAY 18 g 11   levothyroxine (SYNTHROID) 75 MCG tablet Take 1  tablet (75 mcg total) by mouth daily. 30 tablet 3   Multiple Vitamin (MULTIVITAMIN) capsule Take 1 capsule by mouth daily.     Naproxen Sodium (ALEVE PO) Take 1 tablet by mouth as needed.     niacin 500 MG tablet Take 1,000 mg by mouth daily as needed.     Omega-3 Fatty Acids (FISH OIL PO) Take 1 capsule by mouth daily.     OVER THE COUNTER MEDICATION Take 1 tablet by mouth daily as needed (allergies). Over the counter allergy medication     No current facility-administered medications on file prior to visit.    Review of Systems  Constitutional:  Positive for fatigue. Negative for activity change, appetite change, fever and unexpected weight change.  HENT:  Positive for congestion. Negative for ear pain, rhinorrhea, sinus pressure and sore throat.   Eyes:  Negative for pain, redness and visual disturbance.  Respiratory:  Negative for cough, shortness of breath and wheezing.   Cardiovascular:  Negative for chest pain and palpitations.  Gastrointestinal:  Negative for abdominal pain, blood in stool, constipation and diarrhea.  Endocrine: Negative for polydipsia and polyuria.  Genitourinary:  Negative for dysuria, frequency and urgency.  Musculoskeletal:  Negative for arthralgias, back pain and myalgias.  Skin:  Negative for pallor and rash.  Allergic/Immunologic: Negative for environmental allergies.  Neurological:  Negative for dizziness, syncope and headaches.  Hematological:  Negative for adenopathy. Does not bruise/bleed easily.  Psychiatric/Behavioral:  Negative for decreased concentration and dysphoric mood. The patient is nervous/anxious.        Stressors       Objective:   Physical Exam Constitutional:      General: She is not in acute distress.    Appearance: Normal appearance. She is well-developed and normal weight. She is not ill-appearing or diaphoretic.  HENT:     Head: Normocephalic and atraumatic.     Right Ear: Tympanic membrane, ear canal and external ear normal.      Left Ear: Tympanic membrane, ear canal and external ear normal.     Nose: Nose normal. No congestion.     Mouth/Throat:     Mouth: Mucous membranes are moist.     Pharynx: Oropharynx is clear. No posterior oropharyngeal erythema.  Eyes:     General: No scleral icterus.    Extraocular Movements: Extraocular movements intact.     Conjunctiva/sclera: Conjunctivae normal.     Pupils: Pupils are equal, round, and reactive to light.  Neck:     Thyroid: No thyromegaly.     Vascular: No carotid bruit or JVD.  Cardiovascular:     Rate and Rhythm: Normal rate and regular rhythm.     Pulses: Normal pulses.     Heart sounds: Normal heart sounds.    No gallop.  Pulmonary:     Effort: Pulmonary effort is normal. No respiratory distress.     Breath sounds: Normal breath sounds. No wheezing.     Comments: Good air exch Chest:     Chest wall: No tenderness.  Abdominal:     General: Bowel sounds are normal. There is no distension or abdominal bruit.  Palpations: Abdomen is soft. There is no mass.     Tenderness: There is no abdominal tenderness.     Hernia: No hernia is present.  Genitourinary:    Comments: Breast and pelvic exam done by gyn Musculoskeletal:        General: No tenderness. Normal range of motion.     Cervical back: Normal range of motion and neck supple. No rigidity. No muscular tenderness.     Right lower leg: No edema.     Left lower leg: No edema.  Lymphadenopathy:     Cervical: No cervical adenopathy.  Skin:    General: Skin is warm and dry.     Coloration: Skin is not pale.     Findings: No erythema or rash.     Comments: Solar lentigines diffusely   Neurological:     Mental Status: She is alert. Mental status is at baseline.     Cranial Nerves: No cranial nerve deficit.     Motor: No abnormal muscle tone.     Coordination: Coordination normal.     Gait: Gait normal.     Deep Tendon Reflexes: Reflexes are normal and symmetric. Reflexes normal.  Psychiatric:         Mood and Affect: Mood normal.        Cognition and Memory: Cognition and memory normal.          Assessment & Plan:   Problem List Items Addressed This Visit       Endocrine   Hypothyroidism    Pt feels she is struggling with this re: energy level and anxiety  TSH today  Last time cut levothyroxine to 75 mcg daily and ref to endocrinology (that appt if upcoming)       Relevant Orders   TSH (Completed)     Musculoskeletal and Integument   Osteoporosis    dexa 8/21 rev  Taking prolia (past tamoxifen) No falls or fx  Taking ca and D with therapeutic D level   Enc her to keep walking         Other   Pure hypercholesterolemia    Disc goals for lipids and reasons to control them Rev last labs with pt Rev low sat fat diet in detail Taking atorvastatin  LDL of 71  Well controlled        Relevant Medications   atorvastatin (LIPITOR) 10 MG tablet   Encounter for Medicare annual wellness exam - Primary    Reviewed health habits including diet and exercise and skin cancer prevention Reviewed appropriate screening tests for age  Also reviewed health mt list, fam hx and immunization status , as well as social and family history   See HPI Labs reviewed  Reviewed colonoscopy and mammogram (next mammogram is scheduled this month)  utd gyn care with Dr Helane Rima covid vaccinated utd dexa with no falls or fx  Advance directive is utd No cognitive concerns (runs household)  Stable hearing screen (baseline no hearing L ear) utd eye/vision care        Colon cancer screening    Colonoscopy 8/19  Has visit planned with Dr Bary Castilla        Routine general medical examination at a health care facility    Reviewed health habits including diet and exercise and skin cancer prevention Reviewed appropriate screening tests for age  Also reviewed health mt list, fam hx and immunization status , as well as social and family history   See HPI Labs reviewed  Reviewed  colonoscopy and mammogram (next mammogram is scheduled this month)  utd gyn care with Dr Helane Rima covid vaccinated utd dexa with no falls or fx  Advance directive is utd No cognitive concerns (runs household)  Stable hearing screen (baseline no hearing L ear) utd eye/vision care        History of breast cancer    Past tamoxifen  No re occurrence        Anxious mood as adjustment reaction    Some improvement with lexapro 5 mg daily  Pt changed mind about counseling and does not want to do it  Stress level is high - cares for mother in her 90s  Hopes when thyroid levels out it will help mood

## 2021-07-03 NOTE — Assessment & Plan Note (Signed)
Colonoscopy 8/19  Has visit planned with Dr Bary Castilla

## 2021-07-03 NOTE — Assessment & Plan Note (Signed)
Pt feels she is struggling with this re: energy level and anxiety  TSH today  Last time cut levothyroxine to 75 mcg daily and ref to endocrinology (that appt if upcoming)

## 2021-07-03 NOTE — Patient Instructions (Addendum)
Get a flu shot in sept or later   Keep taking good care of yourself   Keep Gabrielle Webb- is a great book by Coral Spikes (available at book stores or on line)    Nasal saline is helpful for congestion and has less side effects   We will re check thyroid today

## 2021-07-12 DIAGNOSIS — Z6824 Body mass index (BMI) 24.0-24.9, adult: Secondary | ICD-10-CM | POA: Diagnosis not present

## 2021-07-12 DIAGNOSIS — Z90710 Acquired absence of both cervix and uterus: Secondary | ICD-10-CM | POA: Diagnosis not present

## 2021-07-12 DIAGNOSIS — Z01419 Encounter for gynecological examination (general) (routine) without abnormal findings: Secondary | ICD-10-CM | POA: Diagnosis not present

## 2021-07-12 DIAGNOSIS — Z124 Encounter for screening for malignant neoplasm of cervix: Secondary | ICD-10-CM | POA: Diagnosis not present

## 2021-07-12 DIAGNOSIS — Z1272 Encounter for screening for malignant neoplasm of vagina: Secondary | ICD-10-CM | POA: Diagnosis not present

## 2021-07-18 ENCOUNTER — Other Ambulatory Visit: Payer: Self-pay | Admitting: General Surgery

## 2021-07-18 DIAGNOSIS — Z86010 Personal history of colonic polyps: Secondary | ICD-10-CM | POA: Diagnosis not present

## 2021-07-18 NOTE — Progress Notes (Signed)
Subjective:     Patient ID: Gabrielle Webb is a 80 y.o. female.   HPI   The following portions of the patient's history were reviewed and updated as appropriate.   This a new patient is here today for: office visit. The patient is here today to discuss a colonoscopy. Patient reports her previous colonoscopy was done by Dr. Vira Agar. She had her last one done in 2019. Patient reports she moves her bowels daily. The patient denies any bleeding or mucus.    Patient reports she had a dizzy spell on July 3rd this year. Patient states her thyroid levels were low and Dr. Loura Pardon had to adjust her medication. The patient states her levels did come up with the medication change.         Chief Complaint  Patient presents with   Pre-op Exam      BP 124/60   Pulse 88   Temp 36.3 C (97.4 F)   Ht 160 cm (_0 )   Wt 61.7 kg (136 lb)   SpO2 97%   BMI 24.09 kg/m        Past Medical History:  Diagnosis Date   Anxiety     Breast cancer (CMS-HCC) 04/20/2011   Cellulitis     GERD (gastroesophageal reflux disease)     Headache     Hiatal hernia     History of radiation therapy 2012   Osteoporosis     PONV (postoperative nausea and vomiting)     Vertigo             Past Surgical History:  Procedure Laterality Date   APPENDECTOMY   2008   back surgery   1970   BREAST EXCISIONAL BIOPSY Left 1983   COLONOSCOPY   04/20/2003    Int Hemorrhoids, Diverticulosis: [facility] 04/2013   COLONOSCOPY   07/13/2013    Adenomatous Polyp: [facility] 07/2018: Recall ltr mailed 05/19/18 (kj)   COLONOSCOPY   08/27/2018    Adenomatous Polyps: [facility] 08/2021    DILATION AND CURETTAGE, DIAGNOSTIC / THERAPEUTIC   05/31/2014   EGD   04/20/2003    No repeat per RTE   HYSTERECTOMY VAGINAL N/A 11/08/2016   knee surgery   1962   LAPAROSCOPIC SALPINGOOPHERECTOMY Bilateral     MASTECTOMY PARTIAL / LUMPECTOMY Right 05/2011                OB History     Gravida  1   Para  1   Term      Preterm      AB       Living         SAB      IAB      Ectopic      Molar      Multiple      Live Births           Obstetric Comments  Age at first period 23 Age of first pregnancy 89             Social History          Socioeconomic History   Marital status: Widowed  Tobacco Use   Smoking status: Never Smoker   Smokeless tobacco: Never Used  Vaping Use   Vaping Use: Never used  Substance and Sexual Activity   Alcohol use: Not Currently   Drug use: Never   Sexual activity: Defer             Allergies  Allergen Reactions   Codeine Nausea And Vomiting   Morphine Nausea And Vomiting   Adhesive Tape-Silicones Other (See Comments)   Ciprofloxacin Other (See Comments)      Nerve tingly feeling   Fentanyl Vomiting   Hydromorphone Vomiting   Prednisone Other (See Comments)      Rapid heart beat      Current Medications        Current Outpatient Medications  Medication Sig Dispense Refill   aspirin 81 MG EC tablet Take 81 mg by mouth once daily.       atorvastatin (LIPITOR) 10 MG tablet Take 10 mg by mouth once daily       biotin 10,000 mcg Cap Take by mouth       chlorthalidone 50 MG tablet Take 50 mg by mouth once daily as needed.       cholecalciferol (VITAMIN D3) 2,000 unit capsule Take 2,000 Units by mouth once daily.       cyanocobalamin, vitamin B-12, (VITAMIN B12 ORAL) Take 2,500 mcg by mouth once daily       denosumab (PROLIA) 60 mg/mL inj syringe Inject subcutaneously once Every 6 months       diclofenac (VOLTAREN) 1 % topical gel Apply 2 g topically 2 (two) times daily.       dicyclomine (BENTYL) 10 mg capsule Take 10 mg by mouth 2 (two) times daily as needed.       docosahexaenoic acid/epa (FISH OIL ORAL) Take by mouth once daily       escitalopram oxalate (LEXAPRO) 5 MG tablet Take 5 mg by mouth once daily       famotidine (PEPCID) 20 MG tablet Take 20 mg by mouth once daily as needed for Heartburn       fluticasone (FLONASE) 50 mcg/actuation nasal spray Place 2  sprays into both nostrils once daily.       levothyroxine (SYNTHROID, LEVOTHROID) 112 MCG tablet Take 110 mcg by mouth once daily. Take on an empty stomach with a glass of water at least 30-60 minutes before breakfast.          loratadine (CLARITIN) 10 mg tablet Take 10 mg by mouth once daily       multivitamin tablet Take 1 tablet by mouth once daily.       naproxen sodium (ALEVE ORAL) Take by mouth once daily as needed       niacin (NIASPAN) 500 MG ER tablet Take 1,000 mg by mouth nightly.       alendronate (FOSAMAX) 70 MG tablet Take 70 mg by mouth every 7 (seven) days. Take with a full glass of water. Do not lie down for the next 30 min. (Patient not taking: Reported on 07/18/2021)       ALPRAZolam (XANAX) 0.25 MG tablet Take 0.25 mg by mouth once daily as needed for Sleep. (Patient not taking: Reported on 07/18/2021)       CALCIUM ORAL Take by mouth       letrozole (FEMARA) 2.5 mg tablet TAKE ONE TABLET BY MOUTH EVERY DAY (Patient not taking: No sig reported) 30 tablet 11   lutein 6 mg Cap Take by mouth (Patient not taking: Reported on 07/18/2021)       ranitidine (ZANTAC) 150 MG tablet Take 150 mg by mouth continuously as needed for Heartburn. (Patient not taking: Reported on 07/18/2021)        No current facility-administered medications for this visit.  Family History  Problem Relation Age of Onset   Congenital heart disease Mother     Atrial fibrillation (Abnormal heart rhythm sometimes requiring treatment with blood thinners) Mother     High blood pressure (Hypertension) Father     Myocardial Infarction (Heart attack) Father     Diabetes Maternal Aunt     Diabetes Maternal Uncle            Review of Systems  Constitutional: Negative for chills and fever.  Respiratory: Negative for cough.          Objective:   Physical Exam Exam conducted with a chaperone present.  Constitutional:      Appearance: Normal appearance.  Cardiovascular:     Rate and Rhythm:  Normal rate and regular rhythm.     Pulses: Normal pulses.     Heart sounds: Normal heart sounds.  Pulmonary:     Effort: Pulmonary effort is normal.     Breath sounds: Normal breath sounds.  Musculoskeletal:     Cervical back: Neck supple.  Skin:    General: Skin is warm and dry.  Neurological:     Mental Status: She is alert and oriented to person, place, and time.  Psychiatric:        Mood and Affect: Mood normal.        Behavior: Behavior normal.        Labs and Radiology:    Pathology from August 27, 2018 showed a small tubular adenoma of the cecum and reported two-tubular adenomas from the ascending colon.  Review of the associated endoscopy report showed these were very small.  The aggregate diameter of the ascending colon polyp was fairly dramatic on the pathology report.   Dr. Vira Agar recommended a 3-year follow-up.   Screening colonoscopy July 13, 2013 showed a 2 mm tubular adenoma in the rectum.   THYROID [place] 07/03/2021 Component     TSH  Free T4    Component 07/03/2021 05/23/2021 06/13/2020 06/03/2019 07/28/2018                   TSH 0.88 0.17 Low    1.85 1.89                  WBC  RBC  Hemoglobin  HCT  MCV  MCH  MCHC  RDW  Platelets  HGB  WBC Count  Platelet Count  nRBC  Component 05/23/2021 08/15/2020 06/13/2020 08/13/2019 06/03/2019 08/11/2018 07/28/2018                       WBC 6.3 -- 6.6 -- 7.1 -- 7.5 Load older lab results  RBC 4.28 4.49 4.49 4.51 4.52 4.58 4.79 Load older lab results  Hemoglobin 13.1 13.4 13.8 13.4 13.9 14.1 14.7 Load older lab results  HCT 38.3 40.5 40.2 40.5 40.9 41.4 43.0 Load older lab results  MCV 89.6 90.2 89.4 89.8 90.4 90.4 89.8 Load older lab results  MCH -- 29.8 -- 29.7 -- 30.8 -- Load older lab results  MCHC 34.1 33.1 34.3 33.1 34.0 34.1 34.1 Load older lab results  RDW 13.7 12.8 13.8 12.7 13.8 13.1 13.9 Load older lab results  Platelets 159.0 -- 163.0 -- 186.0 -- 197.0 Load older lab results   HGB -- -- -- -- -- -- -- Load older lab results  WBC Count -- 7.0 -- 7.2 -- 8.1 --    Platelet Count -- 157 -- 176 -- 159 --    nRBC --  0.0 -- 0.0            CHEM PROFILE [place] 05/23/2021 Component     Sodium  Potassium  Chloride  CO2  Glucose, Bld  BUN  Creatinine, Ser  Calcium  GFR calc non Af Amer  GFR calc Af Amer  Glucose  Creatinine  Total Bilirubin  Alkaline Phosphatase  AST  ALT  Total Protein  Albumin  Anion Gap  EGFR  GFR  Lipase  Anion gap  GFR, Estimated  GFR, Est AFR Am  Glomerular Filtration Rate, Af Am  Bilirubin, Direct  Phosphorus    Component 05/23/2021 05/23/2021 11/22/2020 08/15/2020 06/13/2020 06/13/2020 04/19/2020 08/13/2019 06/03/2019 08/11/2018 07/28/2018                               Sodium -- _0 Load older lab results  Potassium -- 4.2 4.1 3.6 3.9 3.9 3.9 3.8 4.0 3.8 3.5 Load older lab results  Chloride -- _1 Load older lab results  CO2 -- _2 32 Load older lab results  Glucose, Bld -- _3 Load older lab results  BUN -- _4 Load older lab results  Creatinine, Ser -- 0.71 0.75 -- 0.78 0.78 0.74 -- 0.81 -- 0.93 Load older lab results  Calcium -- 9.2 9.1 9.6 9.6 9.6 9.2 9.6 9.4 10.1 10.4 Load older lab results  GFR calc non Af Amer -- -- -- -- -- -- -- -- -- -- -- Load older lab results  GFR calc Af Amer -- -- -- -- -- -- -- -- -- -- -- Load older lab results  Glucose -- -- -- -- -- -- -- -- -- -- -- Load older lab results  Creatinine -- -- -- 0.74 -- -- -- 0.81 -- 0.83 -- Load older lab results  Total Bilirubin 0.8 -- -- 0.5 0.9 -- -- 0.5 0.7 0.5 0.9 Load older lab results  Alkaline Phosphatase 45 -- -- 53 50 -- -- 56 50 67 56 Load older lab results  AST 18 -- -- 21 18 -- -- _5 Load older lab results  ALT 10 -- -- 12 10 -- -- _6 Load older lab results  Total  Protein 6.2 -- -- 7.0 6.6 -- -- 7.0 6.9 7.2 7.4 Load older lab results  Albumin 4.2 -- -- 4.0                           Assessment:     History of right-sided colonic polyps.    Plan:     Recommendations for follow-up reviewed, in 2020 the [name] multisociety task for changed the recommendation for 3 or more polyps less than 10 mm to 3-5 rather than 3 years.  It was still felt appropriate to repeat exams on the old schedule if that was in the patient's best interest.  The patient reports that she is "not a procrastinator" and would like to proceed with the study at this time.   Risks associated with colonoscopy were reviewed.   Patient to be scheduled for a colonoscopy at a convenient date. The patient has been asked to stop her fish oil  one week prior.     This note is partially prepared by Ledell Noss, CMA acting as a scribe in the presence of Dr. Hervey Ard, MD.    The documentation recorded by the scribe accurately reflects the service I personally performed and the decisions made by me.    Robert Bellow, MD FACS

## 2021-07-20 ENCOUNTER — Other Ambulatory Visit: Payer: Self-pay

## 2021-07-20 ENCOUNTER — Ambulatory Visit
Admission: RE | Admit: 2021-07-20 | Discharge: 2021-07-20 | Disposition: A | Discharge status: Home / Self Care | Payer: PPO | Source: Ambulatory Visit | Attending: Family Medicine | Admitting: Family Medicine

## 2021-07-20 DIAGNOSIS — Z1231 Encounter for screening mammogram for malignant neoplasm of breast: Secondary | ICD-10-CM | POA: Diagnosis not present

## 2021-07-26 DIAGNOSIS — C44629 Squamous cell carcinoma of skin of left upper limb, including shoulder: Secondary | ICD-10-CM | POA: Diagnosis not present

## 2021-07-26 DIAGNOSIS — D0462 Carcinoma in situ of skin of left upper limb, including shoulder: Secondary | ICD-10-CM | POA: Diagnosis not present

## 2021-07-26 DIAGNOSIS — S30861A Insect bite (nonvenomous) of abdominal wall, initial encounter: Secondary | ICD-10-CM | POA: Diagnosis not present

## 2021-08-08 DIAGNOSIS — E039 Hypothyroidism, unspecified: Secondary | ICD-10-CM | POA: Diagnosis not present

## 2021-08-09 DIAGNOSIS — C44719 Basal cell carcinoma of skin of left lower limb, including hip: Secondary | ICD-10-CM | POA: Diagnosis not present

## 2021-08-10 ENCOUNTER — Other Ambulatory Visit: Payer: Self-pay | Admitting: *Deleted

## 2021-08-10 DIAGNOSIS — Z170 Estrogen receptor positive status [ER+]: Secondary | ICD-10-CM

## 2021-08-10 DIAGNOSIS — C50411 Malignant neoplasm of upper-outer quadrant of right female breast: Secondary | ICD-10-CM

## 2021-08-11 NOTE — Progress Notes (Signed)
[facility]  [phone] [phone]     ID: Gabrielle Webb DOB: Mar 01, 1941  MR#: [id number]  HMC#:[id number]  Patient Care Team: Abner Greenspan, MD as PCP - General (Family Medicine) Millie Shorb, Virgie Dad, MD as Consulting Physician (Oncology) Dian Queen, MD as Consulting Physician (Obstetrics and Gynecology) Eula Flax, OD as Referring Physician (Ophthalmology) Clyde Canterbury, MD as Referring Physician (Otolaryngology) Oneta Rack, MD as Consulting Physician (Dermatology) Gabrielle Webb, DDS as Consulting Physician (Dentistry) Gabrielle Webb Gabrielle November, MD as Consulting Physician (Cardiology)  OTHER MD: Deitra Mayo MD  CHIEF COMPLAINT: Estrogen receptor positive breast cancer  CURRENT TREATMENT: Observation   INTERVAL HISTORY: Gabrielle Webb returns today for follow-up of her history of estrogen receptor positive breast cancer. She continues under observation.   She started Prolia in Webb 2019.  Her most recent dose was 05/31/2021.  She tolerates this with no side effects that she is aware of.  She goes to her dentist twice a year regularly.  Since her last visit, she underwent bilateral screening mammography with tomography at [facility] on 07/20/2021 showing: breast density category B; no evidence of malignancy in either breast.   Her most recent bone density 07/18/2020 showed a T-score of -2.4  REVIEW OF SYSTEMS: Gabrielle Webb has been having some thyroid issues and is being followed by Dr. Honor Junes of the [facility] for this.  She is scheduled for colonoscopy under Dr.Burnett.  Overall however a detailed review of systems today was benign.   COVID 19 VACCINATION STATUS: Pfizer x3, most recently 09/2020   BREAST CANCER HISTORY: From Dr. Eusebio Me initial intake note 05/10/2011:  "Gabrielle Webb is a pleasant 80 year old female.  She underwent a screening mammogram on 04/20/2011 and was found to have an asymmetric  spiculated mass in the upper outer quadrant.  Ultrasound confirmed the presence of this mass, and a biopsy on 05/02/2011 revealed a low-grade invasive ductal carcinoma.  This measured 11 mm and was ER, PR positive, HER2 negative and Ki-67 was 15%.  An MRI of the bilateral breasts was performed on 05/07/2011.  This showed a 1.5 x 1.0 cm mass.  Gabrielle Webb notes a single episode of possible stinging in her right breast prior to her mammogram.  She has a long history of fibrocystic disease though, so she did not feel anything was greatly out of the ordinary.  Interestingly, she did have a history of several breast biopsies and excisions in 1987 and 1988.  She brings these old clinic records with her, and it looks like some fibrocystic disease was removed as well as an area of atypical cells.  The differential is quite broad, but it is possible given that she has developed this in a very similar area that this was LCIS.  Regardless, at that time when she had those biopsies, she had been on hormone replacement therapy for about a month and took herself off."  Her subsequent history is as detailed below   PAST MEDICAL HISTORY: Past Medical History:  Diagnosis Date   Allergy    Anxiety    Breast cancer ([place]) 7/12   Right   Cellulitis    Diverticulosis of colon    GERD (gastroesophageal reflux disease)    Headache    sinus   History of hiatal hernia    Hx of radiation therapy 06/20/11 - 07/11/11   right breast   Hypothyroid    Osteoporosis    Personal history of radiation therapy 2012   Right Breast Cancer  PONV (postoperative nausea and vomiting)    Skin cancer    basal and squamous cell   Vertigo     PAST SURGICAL HISTORY: Past Surgical History:  Procedure Laterality Date   ABDOMINAL HYSTERECTOMY N/A 11/08/2016   Procedure: HYSTERECTOMY TOTAL  ABDOMINAL;  Surgeon: Dian Queen, MD;  Location: [facility];  Service: Gynecology;  Laterality: N/A;   APPENDECTOMY  07/2007   back sugery  1970    BREAST BIOPSY Left    Benign   BREAST EXCISIONAL BIOPSY Left 1983   BREAST EXCISIONAL BIOPSY Left 1983   BREAST LUMPECTOMY Right 05/16/2011   COLONOSCOPY     COLONOSCOPY WITH PROPOFOL N/A 08/27/2018   Procedure: COLONOSCOPY WITH PROPOFOL;  Surgeon: Manya Silvas, MD;  [facility];  Service: Endoscopy;  Laterality: N/A;   DILATION AND CURETTAGE OF UTERUS N/A 05/31/2014   Procedure: DILATATION AND CURETTAGE WITH ULTRASOUND GUIDANCE;  Surgeon: Cyril Mourning, MD;  Location: [facility];  Service: Gynecology;  Laterality: N/A;   KNEE SURGERY  1962   right eye surgery     SALPINGOOPHORECTOMY Bilateral 11/08/2016   Procedure: BILATERAL SALPINGO OOPHORECTOMY;  Surgeon: Dian Queen, MD;  Location: [facility];  Service: Gynecology;  Laterality: Bilateral;   UPPER GI ENDOSCOPY      FAMILY HISTORY Family History  Problem Relation Age of Onset   Heart attack Father 63   Hypertension Father    Atrial fibrillation Mother    Coronary artery disease Mother    Diabetes Other        Grandmother   Coronary artery disease Other        Grandmother   Uterine cancer Other        Grandmother   Leukemia Other        Aunt   Cancer Maternal Aunt        leukemia   Cancer Maternal Uncle        colon   the patient's father died at the age of 80 from a myocardial infarction. The patient's mother is 24 years old currently. She has atrial fibrillation. She lives at [facility]. The patient is an only child. There is no history of breast or ovarian cancer in the family to her knowledge   GYNECOLOGIC HISTORY:  Patient's last menstrual period was 12/03/1988. Menarche age 89 first live birth age 78. She stopped having periods in her late 27s. 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 Her husband died from widely metastatic cancer of unknown primary (it was never biopsied). She  lives alone in a [place], with 2 cats. Her son Gabrielle Webb "Gabrielle Webb " Careers adviser lives in [place]. He works out of his home for [employer]. The patient has 3 grandchildren, the oldest one is micro-cephalic the other 2 are planning to work in air conditioning and heating and as physical therapist..,  The patient attends a local church in [place]: In place. The patient's son Gabrielle Webb "Gabrielle Webb " [name] is her healthcare power of attorney. He can be reached at [phone].   HEALTH MAINTENANCE: Social History   Tobacco Use   Smoking status: Never   Smokeless tobacco: Never  Vaping Use   Vaping Use: Never used  Substance Use Topics   Alcohol use: No    Alcohol/week: 0.0 standard drinks   Drug use: No     Colonoscopy: Sept 2019. Vira Agar  PAP:  Bone density: 07/2020, -2.4   Allergies  Allergen Reactions   Codeine Nausea And Vomiting   Fentanyl Nausea And Vomiting   Hydromorphone Nausea And Vomiting        Morphine Nausea And Vomiting   Ciprofloxacin     Intolerant- tingling    Tape     Surgical tape   Prednisone Palpitations    Rapid hear beat    Current Outpatient Medications  Medication Sig Dispense Refill   aspirin 81 MG tablet Take 81 mg by mouth daily.     atorvastatin (LIPITOR) 10 MG tablet Take 1 tablet (10 mg total) by mouth daily. 90 tablet 3   BIOTIN PO Take by mouth daily.     CALCIUM PO Take 1,500 mg by mouth daily.     chlorthalidone (HYGROTON) 25 MG tablet TAKE TWO TABLETS EVERY DAY AS NEEDED FORSWELLING 180 tablet 0   Cholecalciferol (VITAMIN D) 2000 UNITS CAPS Take 1 capsule by mouth daily.     Cyanocobalamin (B-12 PO) Take 2,500 mcg by mouth daily.     denosumab (PROLIA) 60 MG/ML SOSY injection Inject 60 mg into the skin every 6 (six) months. 1 mL 0   diclofenac sodium (VOLTAREN) 1 % GEL Apply 2 g topically 4 (four) times daily. Rub into affected area of foot 2 to 4 times daily (Patient taking differently: Apply 2 g topically as needed. Rub  into affected area of foot 2 to 4 times daily) 100 g 2   dicyclomine (BENTYL) 10 MG capsule TAKE 1 CAPSULE BY MOUTH TWICE DAILY FOR SPASMS 180 capsule 0   escitalopram (LEXAPRO) 5 MG tablet Take 1 tablet (5 mg total) by mouth daily. 30 tablet 11   famotidine (PEPCID) 20 MG tablet TAKE ONE TABLET EVERY DAY AS NEEDED FOR HEARTBURN OR INDIGESTION 90 tablet 3   fluticasone (FLONASE) 50 MCG/ACT nasal spray TWO SPRAYS IN EACH NOSTRIL EVERY DAY 18 g 11   levothyroxine (SYNTHROID) 75 MCG tablet Take 1 tablet (75 mcg total) by mouth daily. 30 tablet 3   Multiple Vitamin (MULTIVITAMIN) capsule Take 1 capsule by mouth daily.     Naproxen Sodium (ALEVE PO) Take 1 tablet by mouth as needed.     niacin 500 MG tablet Take 1,000 mg by mouth daily as needed.     Omega-3 Fatty Acids (FISH OIL PO) Take 1 capsule by mouth daily.     OVER THE COUNTER MEDICATION Take 1 tablet by mouth daily as needed (allergies). Over the counter allergy medication     No current facility-administered medications for this visit.    OBJECTIVE: White woman who appears well  Vitals:   08/14/21 0904  BP: 100/60  Pulse: 68  Resp: 18  Temp: (!) 97.5 F (36.4 C)  SpO2: 100%      Body mass index is 24.36 kg/m.    ECOG FS:0 - Asymptomatic  Sclerae unicteric, EOMs intact Wearing a mask No cervical or supraclavicular adenopathy Lungs no rales or rhonchi Heart regular rate and rhythm Abd soft, nontender, positive bowel sounds MSK no focal spinal tenderness, no upper extremity lymphedema Neuro: nonfocal, well oriented, appropriate affect Breasts: The right breast is status postlumpectomy and radiation.  There is no evidence of local recurrence.  The left breast is benign.  Both axillae are benign   LAB RESULTS:  CMP     Component Value Date/Time   NA 140 08/14/2021 0854   NA 142 07/03/2016 0957   K 3.6 08/14/2021 0854  K 4.1 07/03/2016 0957   CL 104 08/14/2021 0854   CL 109 (H) 09/18/2012 0849   CO2 27 08/14/2021  0854   CO2 27 07/03/2016 0957   GLUCOSE 89 08/14/2021 0854   GLUCOSE 86 07/03/2016 0957   GLUCOSE 79 09/18/2012 0849   BUN 16 08/14/2021 0854   BUN 11.8 07/03/2016 0957   CREATININE 0.81 08/14/2021 0854   CREATININE 0.7 07/03/2016 0957   CALCIUM 9.3 08/14/2021 0854   CALCIUM 9.4 07/03/2016 0957   PROT 6.9 08/14/2021 0854   PROT 6.7 07/03/2016 0957   ALBUMIN 4.0 08/14/2021 0854   ALBUMIN 3.8 07/03/2016 0957   AST 19 08/14/2021 0854   AST 19 07/03/2016 0957   ALT 13 08/14/2021 0854   ALT 12 07/03/2016 0957   ALKPHOS 56 08/14/2021 0854   ALKPHOS 55 07/03/2016 0957   BILITOT 0.7 08/14/2021 0854   BILITOT 0.57 07/03/2016 0957   GFRNONAA >60 08/14/2021 0854   GFRAA >60 08/15/2020 0818    INo results found for: SPEP, UPEP  Lab Results  Component Value Date   WBC 6.9 08/14/2021   NEUTROABS 3.9 08/14/2021   HGB 13.2 08/14/2021   HCT 39.1 08/14/2021   MCV 90.3 08/14/2021   PLT 169 08/14/2021      Chemistry      Component Value Date/Time   NA 140 08/14/2021 0854   NA 142 07/03/2016 0957   K 3.6 08/14/2021 0854   K 4.1 07/03/2016 0957   CL 104 08/14/2021 0854   CL 109 (H) 09/18/2012 0849   CO2 27 08/14/2021 0854   CO2 27 07/03/2016 0957   BUN 16 08/14/2021 0854   BUN 11.8 07/03/2016 0957   CREATININE 0.81 08/14/2021 0854   CREATININE 0.7 07/03/2016 0957      Component Value Date/Time   CALCIUM 9.3 08/14/2021 0854   CALCIUM 9.4 07/03/2016 0957   ALKPHOS 56 08/14/2021 0854   ALKPHOS 55 07/03/2016 0957   AST 19 08/14/2021 0854   AST 19 07/03/2016 0957   ALT 13 08/14/2021 0854   ALT 12 07/03/2016 0957   BILITOT 0.7 08/14/2021 0854   BILITOT 0.57 07/03/2016 0957       Lab Results  Component Value Date   LABCA2 17 05/09/2011    No components found for: [id number]  No results for input(s): INR in the last 168 hours.  Urinalysis    Component Value Date/Time   BILIRUBINUR negative 02/24/2021 1544   BILIRUBINUR Negative 11/02/2020 1310   KETONESUR trace (5)  (A) 02/24/2021 1544   PROTEINUR negative 02/24/2021 1544   PROTEINUR Positive (A) 11/02/2020 1310   UROBILINOGEN 0.2 02/24/2021 1544   NITRITE Negative 02/24/2021 1544   NITRITE Negative 11/02/2020 1310   LEUKOCYTESUR Small (1+) (A) 02/24/2021 1544    STUDIES: MM 3D SCREEN BREAST BILATERAL  Result Date: 07/20/2021 CLINICAL DATA:  Screening. EXAM: DIGITAL SCREENING BILATERAL MAMMOGRAM WITH TOMOSYNTHESIS AND CAD TECHNIQUE: Bilateral screening digital craniocaudal and mediolateral oblique mammograms were obtained. Bilateral screening digital breast tomosynthesis was performed. The images were evaluated with computer-aided detection. COMPARISON:  Previous exam(s). ACR Breast Density Category b: There are scattered areas of fibroglandular density. FINDINGS: There are no findings suspicious for malignancy. IMPRESSION: No mammographic evidence of malignancy. A result letter of this screening mammogram will be mailed directly to the patient. RECOMMENDATION: Screening mammogram in one year. (Code:SM-B-01Y) BI-RADS CATEGORY  1: Negative. Electronically Signed   By: Nolon Nations M.D.   On: 07/20/2021 10:30     ASSESSMENT: 80  y.o.  [place] woman status post right breast upper outer quadrant biopsy 05/02/2011 for a low-grade invasive ductal carcinoma which was estrogen receptor 100% positive, progesterone receptor 22% positive, with an MIB-1 of 17% and no HER-2 amplification, the signals ratio being 1.68  ([id number])  2.  Status post right lumpectomy and sentinel lymph node sampling 05/16/2011 for a pT1c pN0, stage IA invasive ductal carcinoma, low-grade, with negative margins  3. Oncotype DX score of 13 predicted an 8% risk of recurrence outside the breast within 10 years if the patient's only systemic therapy is tamoxifen for 5 years. He also predicted no benefit from chemotherapy.  3. Adjuvant radiation completed 07/23/2011  4. Tamoxifen started August 2012, discontinued June 2015  5.  Letrozole started August 2015, discontinued August 2017  (a) alendronate started August 2015-- switched to denosumab/prolia Webb 2019, last dose December 2022.  (b) [facility] 12/01/2015 shows osteoporosis with a T score of -2.5  (c) bone [facility] 07/09/2018 showed a T score of -2.8 osteoporosis  (d) [facility] at [facility] 07/18/2020 showed a T score of -2.4, osteopenia  6. S/p TAH-BSO December 2017, with benign pathology.   PLAN: Graclyn is now a little over 10 years out from definitive surgery for her breast cancer with no evidence of disease recurrence.  This is very favorable.  At this point I feel comfortable releasing her to her primary care physicians.  All she will need in terms of breast cancer follow-up is her yearly bilateral screening mammography and a yearly physician breast exam.  We will be glad to see Kassi again at any point in the future if and when the need arises but as of now are making no further routine appointments for her here.  Total encounter time 25 minutes.*   Donyale Berthold, Virgie Dad, MD  08/14/21 6:47 PM Medical Oncology and Hematology [facility] [place], [place] [address] Tel. [phone]    Fax. [phone]   I, Wilburn Mylar, am acting as scribe for Dr. Virgie Dad. Kymari Nuon.  I, Lurline Del MD, have reviewed the above documentation for accuracy and completeness, and I agree with the above.   *Total Encounter Time as defined by the Centers for Medicare and Medicaid Services includes, in addition to the face-to-face time of a patient visit (documented in the note above) non-face-to-face time: obtaining and reviewing outside history, ordering and reviewing medications, tests or procedures, care coordination (communications with other health care professionals or caregivers) and documentation in the medical record.

## 2021-08-14 ENCOUNTER — Inpatient Hospital Stay: Payer: PPO | Attending: Oncology | Admitting: Oncology

## 2021-08-14 ENCOUNTER — Inpatient Hospital Stay: Payer: PPO

## 2021-08-14 ENCOUNTER — Other Ambulatory Visit: Payer: Self-pay

## 2021-08-14 VITALS — BP 100/60 | HR 68 | Temp 97.5°F | Resp 18 | Ht 63.0 in | Wt 137.5 lb

## 2021-08-14 DIAGNOSIS — Z853 Personal history of malignant neoplasm of breast: Secondary | ICD-10-CM | POA: Diagnosis not present

## 2021-08-14 DIAGNOSIS — C50411 Malignant neoplasm of upper-outer quadrant of right female breast: Secondary | ICD-10-CM | POA: Diagnosis not present

## 2021-08-14 DIAGNOSIS — Z170 Estrogen receptor positive status [ER+]: Secondary | ICD-10-CM | POA: Diagnosis not present

## 2021-08-14 DIAGNOSIS — Z79899 Other long term (current) drug therapy: Secondary | ICD-10-CM | POA: Insufficient documentation

## 2021-08-14 DIAGNOSIS — Z923 Personal history of irradiation: Secondary | ICD-10-CM | POA: Insufficient documentation

## 2021-08-14 DIAGNOSIS — M818 Other osteoporosis without current pathological fracture: Secondary | ICD-10-CM | POA: Insufficient documentation

## 2021-08-14 LAB — CMP (CANCER CENTER ONLY)
ALT: 13 U/L (ref 0–44)
AST: 19 U/L (ref 15–41)
Albumin: 4 g/dL (ref 3.5–5.0)
Alkaline Phosphatase: 56 U/L (ref 38–126)
Anion gap: 9 (ref 5–15)
BUN: 16 mg/dL (ref 8–23)
CO2: 27 mmol/L (ref 22–32)
Calcium: 9.3 mg/dL (ref 8.9–10.3)
Chloride: 104 mmol/L (ref 98–111)
Creatinine: 0.81 mg/dL (ref 0.44–1.00)
GFR, Estimated: >60 mL/min (ref >60)
Glucose, Bld: 89 mg/dL (ref 70–99)
Potassium: 3.6 mmol/L (ref 3.5–5.1)
Sodium: 140 mmol/L (ref 135–145)
Total Bilirubin: 0.7 mg/dL (ref 0.3–1.2)
Total Protein: 6.9 g/dL (ref 6.5–8.1)

## 2021-08-14 LAB — CBC WITH DIFFERENTIAL (CANCER CENTER ONLY)
Abs Immature Granulocytes: 0.02 10*3/uL (ref 0.00–0.07)
Basophils Absolute: 0 10*3/uL (ref 0.0–0.1)
Basophils Relative: 0 %
Eosinophils Absolute: 0.1 10*3/uL (ref 0.0–0.5)
Eosinophils Relative: 1 %
HCT: 39.1 % (ref 36.0–46.0)
Hemoglobin: 13.2 g/dL (ref 12.0–15.0)
Immature Granulocytes: 0 %
Lymphocytes Relative: 33 %
Lymphs Abs: 2.3 10*3/uL (ref 0.7–4.0)
MCH: 30.5 pg (ref 26.0–34.0)
MCHC: 33.8 g/dL (ref 30.0–36.0)
MCV: 90.3 fL (ref 80.0–100.0)
Monocytes Absolute: 0.6 10*3/uL (ref 0.1–1.0)
Monocytes Relative: 9 %
Neutro Abs: 3.9 10*3/uL (ref 1.7–7.7)
Neutrophils Relative %: 57 %
Platelet Count: 169 10*3/uL (ref 150–400)
RBC: 4.33 MIL/uL (ref 3.87–5.11)
RDW: 12.9 % (ref 11.5–15.5)
WBC Count: 6.9 10*3/uL (ref 4.0–10.5)
nRBC: 0 % (ref 0.0–0.2)

## 2021-08-15 ENCOUNTER — Encounter: Payer: Self-pay | Admitting: General Surgery

## 2021-08-16 ENCOUNTER — Ambulatory Visit: Payer: PPO | Admitting: Certified Registered Nurse Anesthetist

## 2021-08-16 ENCOUNTER — Encounter
Admission: RE | Disposition: A | Discharge status: Home / Self Care | Payer: Self-pay | Source: Home / Self Care | Attending: General Surgery

## 2021-08-16 ENCOUNTER — Encounter: Payer: Self-pay | Admitting: General Surgery

## 2021-08-16 ENCOUNTER — Ambulatory Visit
Admission: RE | Admit: 2021-08-16 | Discharge: 2021-08-16 | Disposition: A | Discharge status: Home / Self Care | Payer: PPO | Attending: General Surgery | Admitting: General Surgery

## 2021-08-16 DIAGNOSIS — Z7982 Long term (current) use of aspirin: Secondary | ICD-10-CM | POA: Insufficient documentation

## 2021-08-16 DIAGNOSIS — Z853 Personal history of malignant neoplasm of breast: Secondary | ICD-10-CM | POA: Diagnosis not present

## 2021-08-16 DIAGNOSIS — Z79890 Hormone replacement therapy (postmenopausal): Secondary | ICD-10-CM | POA: Diagnosis not present

## 2021-08-16 DIAGNOSIS — Z881 Allergy status to other antibiotic agents status: Secondary | ICD-10-CM | POA: Diagnosis not present

## 2021-08-16 DIAGNOSIS — Z923 Personal history of irradiation: Secondary | ICD-10-CM | POA: Diagnosis not present

## 2021-08-16 DIAGNOSIS — Z79899 Other long term (current) drug therapy: Secondary | ICD-10-CM | POA: Insufficient documentation

## 2021-08-16 DIAGNOSIS — Z888 Allergy status to other drugs, medicaments and biological substances status: Secondary | ICD-10-CM | POA: Insufficient documentation

## 2021-08-16 DIAGNOSIS — Z85828 Personal history of other malignant neoplasm of skin: Secondary | ICD-10-CM | POA: Diagnosis not present

## 2021-08-16 DIAGNOSIS — Z91048 Other nonmedicinal substance allergy status: Secondary | ICD-10-CM | POA: Insufficient documentation

## 2021-08-16 DIAGNOSIS — Z86010 Personal history of colonic polyps: Secondary | ICD-10-CM | POA: Diagnosis not present

## 2021-08-16 DIAGNOSIS — Z7983 Long term (current) use of bisphosphonates: Secondary | ICD-10-CM | POA: Diagnosis not present

## 2021-08-16 DIAGNOSIS — K5730 Diverticulosis of large intestine without perforation or abscess without bleeding: Secondary | ICD-10-CM | POA: Insufficient documentation

## 2021-08-16 DIAGNOSIS — K219 Gastro-esophageal reflux disease without esophagitis: Secondary | ICD-10-CM | POA: Diagnosis not present

## 2021-08-16 DIAGNOSIS — Z09 Encounter for follow-up examination after completed treatment for conditions other than malignant neoplasm: Secondary | ICD-10-CM | POA: Diagnosis not present

## 2021-08-16 DIAGNOSIS — Z885 Allergy status to narcotic agent status: Secondary | ICD-10-CM | POA: Diagnosis not present

## 2021-08-16 DIAGNOSIS — Z1211 Encounter for screening for malignant neoplasm of colon: Secondary | ICD-10-CM | POA: Diagnosis not present

## 2021-08-16 HISTORY — PX: COLONOSCOPY WITH PROPOFOL: SHX5780

## 2021-08-16 SURGERY — COLONOSCOPY WITH PROPOFOL
Anesthesia: General

## 2021-08-16 MED ORDER — ONDANSETRON HCL 4 MG/2ML IJ SOLN
INTRAMUSCULAR | Status: AC
  Filled 2021-08-16: qty 2

## 2021-08-16 MED ORDER — SODIUM CHLORIDE 0.9 % IV SOLN: INTRAVENOUS | Status: DC

## 2021-08-16 MED ORDER — ONDANSETRON HCL 4 MG/2ML IJ SOLN
INTRAMUSCULAR | Status: DC | PRN
  Administered 2021-08-16: 4 mg via INTRAVENOUS

## 2021-08-16 MED ORDER — PROPOFOL 500 MG/50ML IV EMUL
INTRAVENOUS | Status: DC | PRN
  Administered 2021-08-16: 120 ug/kg/min via INTRAVENOUS

## 2021-08-16 MED ORDER — PROPOFOL 10 MG/ML IV BOLUS
INTRAVENOUS | Status: DC | PRN
  Administered 2021-08-16: 60 mg via INTRAVENOUS

## 2021-08-16 NOTE — Anesthesia Postprocedure Evaluation (Signed)
Anesthesia Post Note  Patient: Gabrielle Webb  Procedure(s) Performed: COLONOSCOPY WITH PROPOFOL  Patient location during evaluation: Endoscopy Anesthesia Type: General Level of consciousness: awake and alert Pain management: pain level controlled Vital Signs Assessment: post-procedure vital signs reviewed and stable Respiratory status: spontaneous breathing, nonlabored ventilation, respiratory function stable and patient connected to nasal cannula oxygen Cardiovascular status: blood pressure returned to baseline and stable Postop Assessment: no apparent nausea or vomiting Anesthetic complications: no   No notable events documented.   Last Vitals:  Vitals:   08/16/21 0927 08/16/21 0947  BP: 126/62 (!) 141/68  Pulse:    Resp:    Temp: (!) 35.8 C   SpO2:      Last Pain:  Vitals:   08/16/21 0947  TempSrc:   PainSc: 0-No pain                 [name]

## 2021-08-16 NOTE — Anesthesia Preprocedure Evaluation (Signed)
Anesthesia Evaluation  Patient identified by MRN, date of birth, ID band Patient awake    Reviewed: Allergy & Precautions, NPO status , Patient's Chart, lab work & pertinent test results  History of Anesthesia Complications (+) PONV and history of anesthetic complications  Airway Mallampati: III  TM Distance: <3 FB Neck ROM: limited    Dental  (+) Chipped   Pulmonary neg pulmonary ROS, neg shortness of breath,    Pulmonary exam normal        Cardiovascular Exercise Tolerance: Good (-) angina(-) Past MI negative cardio ROS Normal cardiovascular exam     Neuro/Psych  Headaches, PSYCHIATRIC DISORDERS    GI/Hepatic Neg liver ROS, hiatal hernia, GERD  Medicated and Controlled,  Endo/Other  Hypothyroidism   Renal/GU negative Renal ROS  negative genitourinary   Musculoskeletal   Abdominal   Peds  Hematology negative hematology ROS (+)   Anesthesia Other Findings Past Medical History: No date: Allergy No date: Anxiety 7/12: Breast cancer ([place])     Comment:  Right No date: Cellulitis No date: Diverticulosis of colon No date: GERD (gastroesophageal reflux disease) No date: Headache     Comment:  sinus No date: History of hiatal hernia 06/20/11 - 07/11/11: Hx of radiation therapy     Comment:  right breast No date: Hypothyroid No date: Osteoporosis 2012: Personal history of radiation therapy     Comment:  Right Breast Cancer No date: PONV (postoperative nausea and vomiting) No date: Skin cancer     Comment:  basal and squamous cell No date: Vertigo  Past Surgical History: 11/08/2016: ABDOMINAL HYSTERECTOMY; N/A     Comment:  Procedure: HYSTERECTOMY TOTAL  ABDOMINAL;  Surgeon:               Dian Queen, MD;  Location: [facility];  Service:               Gynecology;  Laterality: N/A; 07/2007: APPENDECTOMY 1970: back sugery No date: BREAST BIOPSY; Left     Comment:  Benign 1983: BREAST EXCISIONAL BIOPSY;  Left 1983: BREAST EXCISIONAL BIOPSY; Left 05/16/2011: BREAST LUMPECTOMY; Right No date: COLONOSCOPY 08/27/2018: COLONOSCOPY WITH PROPOFOL; N/A     Comment:  Procedure: COLONOSCOPY WITH PROPOFOL;  Surgeon: Manya Silvas, MD;  Location: [facility] ENDOSCOPY;  Service:               Endoscopy;  Laterality: N/A; No date: DIAGNOSTIC LAPAROSCOPY 05/31/2014: DILATION AND CURETTAGE OF UTERUS; N/A     Comment:  Procedure: DILATATION AND CURETTAGE WITH ULTRASOUND               GUIDANCE;  Surgeon: Cyril Mourning, MD;  Location: [facility];  Service: Gynecology;  Laterality: N/A; 04/20/2003: ESOPHAGOGASTRODUODENOSCOPY 1962: KNEE SURGERY No date: MASTECTOMY No date: right eye surgery 11/08/2016: SALPINGOOPHORECTOMY; Bilateral     Comment:  Procedure: BILATERAL SALPINGO OOPHORECTOMY;  Surgeon:               Dian Queen, MD;  Location: [facility];  Service:               Gynecology;  Laterality: Bilateral; No date: UPPER GI ENDOSCOPY  BMI    Body Mass Index: 24.45 kg/m      Reproductive/Obstetrics negative OB ROS  Anesthesia Physical Anesthesia Plan  ASA: 3  Anesthesia Plan: General   Post-op Pain Management:    Induction: Intravenous  PONV Risk Score and Plan: Propofol infusion and TIVA  Airway Management Planned: Natural Airway and Nasal Cannula  Additional Equipment:   Intra-op Plan:   Post-operative Plan:   Informed Consent: I have reviewed the patients History and Physical, chart, labs and discussed the procedure including the risks, benefits and alternatives for the proposed anesthesia with the patient or authorized representative who has indicated his/her understanding and acceptance.     Dental Advisory Given  Plan Discussed with: Anesthesiologist, CRNA and Surgeon  Anesthesia Plan Comments: (Patient consented for risks of anesthesia including but not limited to:  - adverse reactions to  medications - risk of airway placement if required - damage to eyes, teeth, lips or other oral mucosa - nerve damage due to positioning  - sore throat or hoarseness - Damage to heart, brain, nerves, lungs, other parts of body or loss of life  Patient voiced understanding.)        Anesthesia Quick Evaluation

## 2021-08-16 NOTE — Op Note (Signed)
[facility] Gastroenterology Patient Name: Gabrielle Webb Procedure Date: 08/16/2021 8:50 AM MRN: [id number] Account #: [id number] Date of Birth: [birth year] Admit Type: Outpatient Age: 80 Room: [facility] ENDO ROOM 1 Gender: Female Note Status: Finalized Instrument Name: Peds Colonoscope [id number] Procedure:             Colonoscopy Indications:           High risk colon cancer surveillance: Personal history                         of colonic polyps Providers:             Robert Bellow, MD Medicines:             Propofol per Anesthesia Complications:         No immediate complications. Procedure:             Pre-Anesthesia Assessment:                        - Prior to the procedure, a History and Physical was                         performed, and patient medications, allergies and                         sensitivities were reviewed. The patient's tolerance                         of previous anesthesia was reviewed.                        - The risks and benefits of the procedure and the                         sedation options and risks were discussed with the                         patient. All questions were answered and informed                         consent was obtained.                        After obtaining informed consent, the colonoscope was                         passed under direct vision. Throughout the procedure,                         the patient's blood pressure, pulse, and oxygen                         saturations were monitored continuously. The                         Colonoscope was introduced through the anus and                         advanced to the the cecum, identified by appendiceal  orifice and ileocecal valve. The colonoscopy was                         somewhat difficult due to restricted mobility of the                         colon. Successful completion of the procedure was                         aided  by using manual pressure. The patient tolerated                         the procedure well. The quality of the bowel                         preparation was good. Findings:      Multiple medium-mouthed diverticula were found in the sigmoid colon.      The retroflexed view of the distal rectum and anal verge was normal and       showed no anal or rectal abnormalities. Impression:            - Diverticulosis in the sigmoid colon.                        - The distal rectum and anal verge are normal on                         retroflexion view.                        - No specimens collected. Recommendation:        - Repeat colonoscopy in 5 years for surveillance. Procedure Code(s):     --- Professional ---                        [id number], Colonoscopy, flexible; diagnostic, including                         collection of specimen(s) by brushing or washing, when                         performed (separate procedure) Diagnosis Code(s):     --- Professional ---                        K57.30, Diverticulosis of large intestine without                         perforation or abscess without bleeding                        Z86.010, Personal history of colonic polyps CPT copyright 2019 American Medical Association. All rights reserved. The codes documented in this report are preliminary and upon coder review may  be revised to meet current compliance requirements. Robert Bellow, MD 08/16/2021 9:28:08 AM This report has been signed electronically. Number of Addenda: 0 Note Initiated On: 08/16/2021 8:50 AM Scope Withdrawal Time: 0 hours 9 minutes 27 seconds  Total Procedure Duration: 0 hours 18 minutes 59 seconds  Estimated Blood Loss:  Estimated blood loss: none.  [facility]

## 2021-08-16 NOTE — H&P (Signed)
Gabrielle Webb OP:[id number] June 16, 1941     HPI:  Past history of ascending colon polyps.  For repeat exam. Tolerated prep well.   Medications Prior to Admission  Medication Sig Dispense Refill Last Dose   chlorthalidone (HYGROTON) 25 MG tablet TAKE TWO TABLETS EVERY DAY AS NEEDED FORSWELLING 180 tablet 0 Past Month   Cholecalciferol (VITAMIN D) 2000 UNITS CAPS Take 1 capsule by mouth daily.   Past Week   diclofenac sodium (VOLTAREN) 1 % GEL Apply 2 g topically 4 (four) times daily. Rub into affected area of foot 2 to 4 times daily (Patient taking differently: Apply 2 g topically as needed. Rub into affected area of foot 2 to 4 times daily) 100 g 2 Past Month   dicyclomine (BENTYL) 10 MG capsule TAKE 1 CAPSULE BY MOUTH TWICE DAILY FOR SPASMS 180 capsule 0 08/15/2021   escitalopram (LEXAPRO) 5 MG tablet Take 1 tablet (5 mg total) by mouth daily. 30 tablet 11 08/15/2021   famotidine (PEPCID) 20 MG tablet TAKE ONE TABLET EVERY DAY AS NEEDED FOR HEARTBURN OR INDIGESTION 90 tablet 3 08/15/2021   fluticasone (FLONASE) 50 MCG/ACT nasal spray TWO SPRAYS IN EACH NOSTRIL EVERY DAY 18 g 11 Past Week   levothyroxine (SYNTHROID) 75 MCG tablet Take 1 tablet (75 mcg total) by mouth daily. 30 tablet 3 08/15/2021   Omega-3 Fatty Acids (FISH OIL PO) Take 1 capsule by mouth daily.   Past Week   alendronate (FOSAMAX) 70 MG tablet Take 70 mg by mouth once a week. Take with a full glass of water on an empty stomach. (Patient not taking: Reported on 08/16/2021)   Not Taking   ALPRAZolam (XANAX) 0.25 MG tablet Take 0.25 mg by mouth at bedtime as needed for anxiety. (Patient not taking: Reported on 08/16/2021)   Not Taking   aspirin 81 MG tablet Take 81 mg by mouth daily.   08/13/2021   atorvastatin (LIPITOR) 10 MG tablet Take 1 tablet (10 mg total) by mouth daily. 90 tablet 3 08/13/2021   BIOTIN PO Take by mouth daily.   08/10/2021   CALCIUM PO Take 1,500 mg by mouth daily.   08/10/2021   Cyanocobalamin (B-12 PO) Take 2,500 mcg  by mouth daily.   08/10/2021   denosumab (PROLIA) 60 MG/ML SOSY injection Inject 60 mg into the skin every 6 (six) months. 1 mL 0 05/17/2021   loratadine (CLARITIN) 10 MG tablet Take 10 mg by mouth daily. (Patient not taking: Reported on 08/16/2021)   Not Taking   Lutein 6 MG CAPS Take by mouth daily. (Patient not taking: Reported on 08/16/2021)   Not Taking   Multiple Vitamin (MULTIVITAMIN) capsule Take 1 capsule by mouth daily.   08/10/2021   Naproxen Sodium (ALEVE PO) Take 1 tablet by mouth as needed. (Patient not taking: Reported on 08/16/2021)   Not Taking   niacin 500 MG tablet Take 1,000 mg by mouth daily as needed. (Patient not taking: Reported on 08/16/2021)   Not Taking   OVER THE COUNTER MEDICATION Take 1 tablet by mouth daily as needed (allergies). Over the counter allergy medication      raNITIdine HCl (ZANTAC PO) Take 150 mg by mouth as needed. (Patient not taking: Reported on 08/16/2021)   Not Taking   Allergies  Allergen Reactions   Codeine Nausea And Vomiting   Fentanyl Nausea And Vomiting   Hydromorphone Nausea And Vomiting        Morphine Nausea And Vomiting   Ciprofloxacin  Intolerant- tingling    Tape     Surgical tape   Prednisone Palpitations    Rapid hear beat   Past Medical History:  Diagnosis Date   Allergy    Anxiety    Breast cancer ([place]) 7/12   Right   Cellulitis    Diverticulosis of colon    GERD (gastroesophageal reflux disease)    Headache    sinus   History of hiatal hernia    Hx of radiation therapy 06/20/11 - 07/11/11   right breast   Hypothyroid    Osteoporosis    Personal history of radiation therapy 2012   Right Breast Cancer   PONV (postoperative nausea and vomiting)    Skin cancer    basal and squamous cell   Vertigo    Past Surgical History:  Procedure Laterality Date   ABDOMINAL HYSTERECTOMY N/A 11/08/2016   Procedure: HYSTERECTOMY TOTAL  ABDOMINAL;  Surgeon: Dian Queen, MD;  Location: [facility];  Service: Gynecology;  Laterality:  N/A;   APPENDECTOMY  07/2007   back sugery  1970   BREAST BIOPSY Left    Benign   BREAST EXCISIONAL BIOPSY Left 1983   BREAST EXCISIONAL BIOPSY Left 1983   BREAST LUMPECTOMY Right 05/16/2011   COLONOSCOPY     COLONOSCOPY WITH PROPOFOL N/A 08/27/2018   Procedure: COLONOSCOPY WITH PROPOFOL;  Surgeon: Manya Silvas, MD;  Location: [facility] ENDOSCOPY;  Service: Endoscopy;  Laterality: N/A;   DIAGNOSTIC LAPAROSCOPY     DILATION AND CURETTAGE OF UTERUS N/A 05/31/2014   Procedure: DILATATION AND CURETTAGE WITH ULTRASOUND GUIDANCE;  Surgeon: Cyril Mourning, MD;  Location: [facility];  Service: Gynecology;  Laterality: N/A;   ESOPHAGOGASTRODUODENOSCOPY  04/20/2003   KNEE SURGERY  1962   MASTECTOMY     right eye surgery     SALPINGOOPHORECTOMY Bilateral 11/08/2016   Procedure: BILATERAL SALPINGO OOPHORECTOMY;  Surgeon: Dian Queen, MD;  Location: [facility];  Service: Gynecology;  Laterality: Bilateral;   UPPER GI ENDOSCOPY     Social History   Socioeconomic History   Marital status: Widowed    Spouse name: Not on file   Number of children: 1   Years of education: Not on file   Highest education level: Not on file  Occupational History   Occupation: insurance agency  Tobacco Use   Smoking status: Never   Smokeless tobacco: Never  Vaping Use   Vaping Use: Never used  Substance and Sexual Activity   Alcohol use: No    Alcohol/week: 0.0 standard drinks   Drug use: No   Sexual activity: Not Currently    Birth control/protection: Post-menopausal  Other Topics Concern   Not on file  Social History Narrative   Exercises on [employer] elliptical   Social Determinants of Health   Financial Resource Strain: Not on file  Food Insecurity: Not on file  Transportation Needs: Not on file  Physical Activity: Not on file  Stress: Not on file  Social Connections: Not on file  Intimate Partner Violence: Not on file   Social History   Social History Narrative   Exercises on [employer]  elliptical     ROS: Negative.   Pathology from August 27, 2018 showed a small tubular adenoma of the cecum and reported two-tubular adenomas from the ascending colon.  Review of the associated endoscopy report showed these were very small.  The aggregate diameter of the ascending colon polyp was fairly dramatic on the pathology report  PE: HEENT: Negative. Lungs: Clear.  Cardio: RR.  Assessment/Plan:  Proceed with planned endoscopy.   Forest Gleason [facility] 08/16/2021

## 2021-08-16 NOTE — Transfer of Care (Signed)
Immediate Anesthesia Transfer of Care Note  Patient: Gabrielle Webb  Procedure(s) Performed: COLONOSCOPY WITH PROPOFOL  Patient Location: PACU  Anesthesia Type:General  Level of Consciousness: drowsy  Airway & Oxygen Therapy: Patient Spontanous Breathing  Post-op Assessment: Report given to RN and Post -op Vital signs reviewed and stable  Post vital signs: Reviewed and stable  Last Vitals:  Vitals Value Taken Time  BP    Temp    Pulse 58 08/16/21 0927  Resp 14 08/16/21 0927  SpO2 100 % 08/16/21 0927  Vitals shown include unvalidated device data.  Last Pain:  Vitals:   08/16/21 0806  TempSrc: Temporal  PainSc: 0-No pain         Complications: No notable events documented.

## 2021-08-17 ENCOUNTER — Encounter: Payer: Self-pay | Admitting: General Surgery

## 2021-08-24 ENCOUNTER — Telehealth: Payer: Self-pay | Admitting: Family Medicine

## 2021-08-24 MED ORDER — LEVOTHYROXINE SODIUM 75 MCG PO TABS: 75.0000 ug | ORAL_TABLET | Freq: Every day | ORAL | 0 refills | Status: DC

## 2021-08-24 NOTE — Telephone Encounter (Signed)
Pt called asking if Dr .Glori Bickers would change her medication levothyroxine (SYNTHROID) 75 MCG tablet to a 90 day supply verses a 30 day supply

## 2021-08-24 NOTE — Addendum Note (Signed)
Addended by: Tammi Sou on: 08/24/2021 03:22 PM   Modules accepted: Orders

## 2021-08-30 DIAGNOSIS — C44629 Squamous cell carcinoma of skin of left upper limb, including shoulder: Secondary | ICD-10-CM | POA: Diagnosis not present

## 2021-09-21 ENCOUNTER — Encounter: Payer: Self-pay | Admitting: *Deleted

## 2021-09-22 ENCOUNTER — Other Ambulatory Visit: Payer: Self-pay | Admitting: Family Medicine

## 2021-09-22 NOTE — Telephone Encounter (Signed)
Last filled on 04/27/21 #180 caps with 0 refills. CPE was on 07/03/21, CPE is scheduled for 07/04/21

## 2021-10-17 ENCOUNTER — Telehealth: Payer: Self-pay

## 2021-10-17 NOTE — Telephone Encounter (Signed)
Benefits submitted, Next injection after 12/01/21

## 2021-11-03 ENCOUNTER — Telehealth: Payer: Self-pay | Admitting: Family Medicine

## 2021-11-03 DIAGNOSIS — M810 Age-related osteoporosis without current pathological fracture: Secondary | ICD-10-CM

## 2021-11-03 NOTE — Telephone Encounter (Signed)
Pt called for her prolia shot. I scheduled her appt for 11/21/21 at 3:00. Pt would like her prolia injection called in at [facility] in [place].

## 2021-11-03 NOTE — Telephone Encounter (Signed)
Aware, thanks for calling her

## 2021-11-03 NOTE — Telephone Encounter (Signed)
Called patient and left detailed message asking her to call me back. Need to discuss her RX, pharmacy, reschedule appt because she is not due until 12/30 or after, needs lab appt prior also.

## 2021-11-06 ENCOUNTER — Telehealth: Payer: Self-pay | Admitting: Family Medicine

## 2021-11-06 MED ORDER — PROLIA 60 MG/ML ~~LOC~~ SOSY: 60.0000 mg | PREFILLED_SYRINGE | SUBCUTANEOUS | 0 refills | Status: DC

## 2021-11-06 NOTE — Telephone Encounter (Signed)
/  Spoke with patient. [facility]. Lab appointment made for 11/28/21. Nurse visit will be on 12/01/21 at 10 am. Do not charge for medication.

## 2021-11-06 NOTE — Chronic Care Management (AMB) (Signed)
  Chronic Care Management   Outreach Note  11/06/2021 Name: Gabrielle Webb MRN: [id number] DOB: Feb 07, 1941  Referred by: Tower, Wynelle Fanny, MD Reason for referral : No chief complaint on file.   An unsuccessful telephone outreach was attempted today. The patient was referred to the pharmacist for assistance with care management and care coordination.   Follow Up Plan:   Tatjana Dellinger Upstream Scheduler

## 2021-11-06 NOTE — Telephone Encounter (Signed)
Gabrielle Webb called in returning a phone call  to [facility]

## 2021-11-06 NOTE — Addendum Note (Signed)
Addended by: Kris Mouton on: 11/06/2021 11:13 AM   Modules accepted: Orders

## 2021-11-07 DIAGNOSIS — E0500 Thyrotoxicosis with diffuse goiter without thyrotoxic crisis or storm: Secondary | ICD-10-CM | POA: Diagnosis not present

## 2021-11-13 ENCOUNTER — Telehealth: Payer: Self-pay | Admitting: Family Medicine

## 2021-11-13 NOTE — Chronic Care Management (AMB) (Signed)
  Chronic Care Management   Outreach Note  11/13/2021 Name: Gabrielle Webb MRN: [id number] DOB: [birth year]  Referred by: Tower, Wynelle Fanny, MD Reason for referral : No chief complaint on file.   A second unsuccessful telephone outreach was attempted today. The patient was referred to pharmacist for assistance with care management and care coordination.  Follow Up Plan:   Tatjana Dellinger Upstream Scheduler

## 2021-11-20 ENCOUNTER — Telehealth: Payer: Self-pay | Admitting: Family Medicine

## 2021-11-20 NOTE — Chronic Care Management (AMB) (Signed)
°  Chronic Care Management   Note  11/20/2021 Name: Gabrielle Webb MRN: [id number] DOB: [birth year]  Gabrielle Webb is a 80 y.o. year old female who is a primary care patient of Tower, Wynelle Fanny, MD. I reached out to Rica Koyanagi by phone today in response to a referral sent by Ms. [name]'s PCP, Tower, Wynelle Fanny, MD.   Gabrielle Webb was given information about Chronic Care Management services today including:  CCM service includes personalized support from designated clinical staff supervised by her physician, including individualized plan of care and coordination with other care providers 24/7 contact phone numbers for assistance for urgent and routine care needs. Service will only be billed when office clinical staff spend 20 minutes or more in a month to coordinate care. Only one practitioner may furnish and bill the service in a calendar month. The patient may stop CCM services at any time (effective at the end of the month) by phone call to the office staff.   Patient agreed to services and verbal consent obtained.   Follow up plan:   Tatjana Secretary/administrator

## 2021-11-21 ENCOUNTER — Ambulatory Visit: Payer: PPO

## 2021-11-22 ENCOUNTER — Other Ambulatory Visit: Payer: Self-pay | Admitting: Family Medicine

## 2021-11-22 DIAGNOSIS — M810 Age-related osteoporosis without current pathological fracture: Secondary | ICD-10-CM

## 2021-11-28 ENCOUNTER — Other Ambulatory Visit: Payer: Self-pay

## 2021-11-28 ENCOUNTER — Other Ambulatory Visit (INDEPENDENT_AMBULATORY_CARE_PROVIDER_SITE_OTHER): Payer: PPO

## 2021-11-28 DIAGNOSIS — M810 Age-related osteoporosis without current pathological fracture: Secondary | ICD-10-CM

## 2021-11-28 LAB — BASIC METABOLIC PANEL
BUN: 12 mg/dL (ref 6–23)
CO2: 29 mEq/L (ref 19–32)
Calcium: 8.9 mg/dL (ref 8.4–10.5)
Chloride: 106 mEq/L (ref 96–112)
Creatinine, Ser: 0.74 mg/dL (ref 0.40–1.20)
GFR: 77.43 mL/min (ref >60.00)
Glucose, Bld: 83 mg/dL (ref 70–99)
Potassium: 4.1 mEq/L (ref 3.5–5.1)
Sodium: 141 mEq/L (ref 135–145)

## 2021-11-29 NOTE — Telephone Encounter (Signed)
Benefits received. Patient gets RX from [facility]. RX was sent in. NV on 11/30/21 Lab done on 11/28/21 Calcium was normal at 8.9 CrCl is 61.92 mL/min  Do not charge for med

## 2021-11-30 ENCOUNTER — Ambulatory Visit (INDEPENDENT_AMBULATORY_CARE_PROVIDER_SITE_OTHER): Payer: PPO

## 2021-11-30 ENCOUNTER — Other Ambulatory Visit: Payer: Self-pay

## 2021-11-30 DIAGNOSIS — M810 Age-related osteoporosis without current pathological fracture: Secondary | ICD-10-CM

## 2021-11-30 MED ORDER — DENOSUMAB 60 MG/ML ~~LOC~~ SOSY
60.0000 mg | PREFILLED_SYRINGE | Freq: Once | SUBCUTANEOUS | Status: AC
  Administered 2021-11-30: 12:00:00 60 mg via SUBCUTANEOUS

## 2021-11-30 MED ORDER — CYANOCOBALAMIN 1000 MCG/ML IJ SOLN: 1000.0000 ug | Freq: Once | INTRAMUSCULAR | Status: DC

## 2021-11-30 NOTE — Progress Notes (Signed)
Per orders of Dr. Tower, injection of Prolia given by Zitlali Primm G Craige Patel. ?Patient tolerated injection well.  ? ?

## 2021-12-01 ENCOUNTER — Ambulatory Visit: Payer: PPO

## 2021-12-05 ENCOUNTER — Other Ambulatory Visit: Payer: Self-pay | Admitting: Family Medicine

## 2021-12-13 ENCOUNTER — Telehealth: Payer: Self-pay | Admitting: Family Medicine

## 2021-12-13 DIAGNOSIS — E2839 Other primary ovarian failure: Secondary | ICD-10-CM

## 2021-12-13 NOTE — Telephone Encounter (Signed)
Gabrielle Webb called in and stated that she tried to schedule an appointment for mammogram and bone denisty and she wanted to have it at the breast center on [address]

## 2021-12-13 NOTE — Telephone Encounter (Signed)
And wanted to know if she is caught up on her shots

## 2021-12-15 NOTE — Telephone Encounter (Signed)
Left a detailed VM (DPR) for pt telling her that she isn't due for either imaging yet and also which shots she is due for.

## 2021-12-15 NOTE — Telephone Encounter (Signed)
It looks like neither her mammogram or her dexa are due until aug 2023  Correct me if I am wrong Can't order this early  In terms of imms I do not see a recent covid booster in her chart or a flu shot

## 2021-12-18 ENCOUNTER — Telehealth: Payer: Self-pay | Admitting: Family Medicine

## 2021-12-18 DIAGNOSIS — E2839 Other primary ovarian failure: Secondary | ICD-10-CM

## 2021-12-18 DIAGNOSIS — Z1231 Encounter for screening mammogram for malignant neoplasm of breast: Secondary | ICD-10-CM | POA: Insufficient documentation

## 2021-12-18 NOTE — Telephone Encounter (Signed)
Not due for mammogram and dexa until august- I think it is too early to order them

## 2021-12-18 NOTE — Telephone Encounter (Signed)
Patent informed will call if any further questions.

## 2021-12-18 NOTE — Telephone Encounter (Signed)
Pt called stating that she needs an order for a mammogram and bone density at the [facility] in [place] on [address] states that she usually just call, but now they are asking for an order from Dr Glori Bickers. Pt is asking for a call back to let her know when the order is place so she can make her appointment. Please advise.

## 2021-12-18 NOTE — Telephone Encounter (Signed)
Last seen in office 07/03/21 Last mammogram 07/20/2021 Last dexa 07/18/2020

## 2021-12-18 NOTE — Telephone Encounter (Signed)
Patient would like to schedule for after August. She like to make out some in order to get the time/date she would like. Request that we put order in so that she can call and make for after due date.

## 2021-12-18 NOTE — Telephone Encounter (Signed)
The orders are in   Unsure if they may expire but she can try to make appt if they book out that far

## 2021-12-20 DIAGNOSIS — D0472 Carcinoma in situ of skin of left lower limb, including hip: Secondary | ICD-10-CM | POA: Diagnosis not present

## 2021-12-20 DIAGNOSIS — D485 Neoplasm of uncertain behavior of skin: Secondary | ICD-10-CM | POA: Diagnosis not present

## 2021-12-20 DIAGNOSIS — L814 Other melanin hyperpigmentation: Secondary | ICD-10-CM | POA: Diagnosis not present

## 2021-12-20 DIAGNOSIS — C44619 Basal cell carcinoma of skin of left upper limb, including shoulder: Secondary | ICD-10-CM | POA: Diagnosis not present

## 2021-12-20 DIAGNOSIS — L570 Actinic keratosis: Secondary | ICD-10-CM | POA: Diagnosis not present

## 2021-12-20 DIAGNOSIS — C44612 Basal cell carcinoma of skin of right upper limb, including shoulder: Secondary | ICD-10-CM | POA: Diagnosis not present

## 2021-12-20 DIAGNOSIS — L720 Epidermal cyst: Secondary | ICD-10-CM | POA: Diagnosis not present

## 2021-12-20 DIAGNOSIS — D229 Melanocytic nevi, unspecified: Secondary | ICD-10-CM | POA: Diagnosis not present

## 2021-12-20 DIAGNOSIS — L821 Other seborrheic keratosis: Secondary | ICD-10-CM | POA: Diagnosis not present

## 2022-01-03 ENCOUNTER — Telehealth: Payer: Self-pay

## 2022-01-03 NOTE — Chronic Care Management (AMB) (Signed)
Chronic Care Management Pharmacy Assistant   Name: CORTLYNN HOLLINSWORTH  MRN: [id number] DOB: 12/01/1941  Ermalinda Memos Byrer is an 81 y.o. year old female who presents for his initial CCM visit with the clinical pharmacist.  Reason for Encounter: Initial Questions   Conditions to be addressed/monitored: HLD   Recent office visits:  07/03/21-PCP-Marne Tower,MD- Patient presented for AWV.Discussed routine screenings, vaccines, TSH ordered, Nasal saline is helpful for congestion and has less side effects   Recent consult visits:  11/07/21-Ophthalmology-Chadwick Brasington- no data found 08/30/21-Dermatology-Arin Isenstein- no data found 08/16/21-[facility]-Jeffrey Byrnett,MD- Patient presented for colonoscopy procedure. 08/14/21-Oncology-Gustav Magrinat,MD-Patient presented for follow up estrogen receptor positive breast cancer. No evidence of disease recurrence. 08/09/21-Dermatology-Arin Isenstein-no data found 08/08/21-Endocrinology- Virgina Organ presented for consult for hypothyroidism and osteoporosis.Labs ordered(normal result) 07/26/21-Dermatology-Arin Isenstein- no data found 07/18/21-Gastroenterology-Jeffrey Byrnett,MD-Patient presented for pre-op for colonoscopy-stop fish oil for 1 week,miralax colonoscopy preparation given. 07/12/21-OB/GYN-Michelle Grewal,MD- no data found   Hospital visits:  None in previous 6 months  Medications: Outpatient Encounter Medications as of 01/03/2022  Medication Sig   alendronate (FOSAMAX) 70 MG tablet Take 70 mg by mouth once a week. Take with a full glass of water on an empty stomach. (Patient not taking: Reported on 08/16/2021)   ALPRAZolam (XANAX) 0.25 MG tablet Take 0.25 mg by mouth at bedtime as needed for anxiety. (Patient not taking: Reported on 08/16/2021)   aspirin 81 MG tablet Take 81 mg by mouth daily.   atorvastatin (LIPITOR) 10 MG tablet Take 1 tablet (10 mg total) by mouth daily.   BIOTIN PO Take by mouth daily.   CALCIUM PO Take  1,500 mg by mouth daily.   chlorthalidone (HYGROTON) 25 MG tablet TAKE TWO TABLETS EVERY DAY AS NEEDED FORSWELLING   Cholecalciferol (VITAMIN D) 2000 UNITS CAPS Take 1 capsule by mouth daily.   Cyanocobalamin (B-12 PO) Take 2,500 mcg by mouth daily.   denosumab (PROLIA) 60 MG/ML SOSY injection Inject 60 mg into the skin every 6 (six) months.   diclofenac sodium (VOLTAREN) 1 % GEL Apply 2 g topically 4 (four) times daily. Rub into affected area of foot 2 to 4 times daily (Patient taking differently: Apply 2 g topically as needed. Rub into affected area of foot 2 to 4 times daily)   dicyclomine (BENTYL) 10 MG capsule TAKE 1 CAPSULE BY MOUTH TWICE DAILY FOR SPASMS   escitalopram (LEXAPRO) 5 MG tablet Take 1 tablet (5 mg total) by mouth daily.   famotidine (PEPCID) 20 MG tablet TAKE ONE TABLET EVERY DAY AS NEEDED FOR HEARTBURN OR INDIGESTION   fluticasone (FLONASE) 50 MCG/ACT nasal spray TWO SPRAYS IN EACH NOSTRIL EVERY DAY   levothyroxine (SYNTHROID) 75 MCG tablet TAKE ONE TABLET BY MOUTH DAILY BEFORE BREAKFAST   loratadine (CLARITIN) 10 MG tablet Take 10 mg by mouth daily. (Patient not taking: Reported on 08/16/2021)   Lutein 6 MG CAPS Take by mouth daily. (Patient not taking: Reported on 08/16/2021)   Multiple Vitamin (MULTIVITAMIN) capsule Take 1 capsule by mouth daily.   Naproxen Sodium (ALEVE PO) Take 1 tablet by mouth as needed. (Patient not taking: Reported on 08/16/2021)   niacin 500 MG tablet Take 1,000 mg by mouth daily as needed. (Patient not taking: Reported on 08/16/2021)   Omega-3 Fatty Acids (FISH OIL PO) Take 1 capsule by mouth daily.   OVER THE COUNTER MEDICATION Take 1 tablet by mouth daily as needed (allergies). Over the counter allergy medication   raNITIdine HCl (ZANTAC PO) Take 150 mg by  mouth as needed. (Patient not taking: Reported on 08/16/2021)   No facility-administered encounter medications on file as of 01/03/2022.    No results found for: HGBA1C, MICROALBUR   BP Readings  from Last 3 Encounters:  08/16/21 (!) 141/68  08/14/21 100/60  07/03/21 112/66    Patient contacted to review initial questions prior to visit with Charlene Brooke.  Have you seen any other providers since your last visit with PCP? Yes  Ophthalmology, Dermatology,Oncology,Endocrinology  Any changes in your medications or health? No  Any side effects from any medications? No  Do you have an symptoms or problems not managed by your medications? No  Any concerns about your health right now? No  Has your provider asked that you check blood pressure, blood sugar, or follow special diet at home? Yes    The patient reports eating healthy meals and watching saturated fat content in foods.  Do you get any type of exercise on a regular basis?  The patient reports she walks for exercise when weather permits and some aerobics at home .  Can you think of a goal you would like to reach for your health? No  Do you have any problems getting your medications? No [facility]  Is there anything that you would like to discuss during the appointment? No   Spoke with patient and reminded them to have all medications, supplements and any blood glucose and blood pressure readings available for review with pharmacist, at their telephone visit on 01/08/22 at 9:00am.   Star Rating Drugs:  Medication:  Last Fill: Day Supply Atorvastatin 10mg  06/26/21 90 Last fill per [facility]    Care Gaps: Annual wellness visit in last year? Yes Most Recent BP reading:100/60 68-P 08/14/21   Marjo Bicker  CPP notified  [name], [place] Assistant [phone]  Total time spent for month CPA: 40 min.

## 2022-01-08 ENCOUNTER — Ambulatory Visit: Payer: PPO | Admitting: Pharmacist

## 2022-01-08 ENCOUNTER — Other Ambulatory Visit: Payer: Self-pay

## 2022-01-08 DIAGNOSIS — M810 Age-related osteoporosis without current pathological fracture: Secondary | ICD-10-CM

## 2022-01-08 DIAGNOSIS — F4322 Adjustment disorder with anxiety: Secondary | ICD-10-CM

## 2022-01-08 DIAGNOSIS — K219 Gastro-esophageal reflux disease without esophagitis: Secondary | ICD-10-CM

## 2022-01-08 DIAGNOSIS — E7800 Pure hypercholesterolemia, unspecified: Secondary | ICD-10-CM

## 2022-01-08 DIAGNOSIS — E039 Hypothyroidism, unspecified: Secondary | ICD-10-CM

## 2022-01-08 NOTE — Patient Instructions (Signed)
Visit Information  Phone number for Pharmacist: [phone]  Thank you for meeting with me to discuss your medications! I look forward to working with you to achieve your health care goals. Below is a summary of what we talked about during the visit:   Goals Addressed             This Visit's Progress    Prevent Falls and Broken Bones-Osteoporosis       Timeframe:  Long-Range Goal Priority:  Medium Start Date:     01/08/22                        Expected End Date:     01/08/23                  Follow Up Date Feb 2024   - always use handrails on the stairs - always wear shoes or slippers with non-slip sole - get at least 10 minutes of activity every day - keep a flashlight by the bed - keep cell phone with me always - pick up clutter from the floors - use a nightlight in the bathroom    Why is this important?   When you fall, there are 3 things that control if a bone breaks or not.  These are the fall itself, how hard and the direction that you fall and how fragile your bones are.  Preventing falls is very important for you because of fragile bones.     Notes:         Care Plan : CCM Pharmacy Care Plan  Updates made by Charlton Haws, RPH since 01/08/2022 12:00 AM     Problem: Hyperlipidemia, GERD, Hypothyroidism, Anxiety, Osteoporosis, and Allergic Rhinitis   Priority: High     Long-Range Goal: Disease mgmt   Start Date: 01/08/2022  Expected End Date: 01/08/2023  This Visit's Progress: On track  Priority: High  Note:   Current Barriers:  Unable to independently monitor therapeutic efficacy  Pharmacist Clinical Goal(s):  Patient will achieve adherence to monitoring guidelines and medication adherence to achieve therapeutic efficacy through collaboration with PharmD and provider.   Interventions: 1:1 collaboration with Tower, Wynelle Fanny, MD regarding development and update of comprehensive plan of care as evidenced by provider attestation and  co-signature Inter-disciplinary care team collaboration (see longitudinal plan of care) Comprehensive medication review performed; medication list updated in electronic medical record  Hyperlipidemia: (LDL goal < 100) -Controlled - LDL is at goal; pt is wondering if she should still be taking aspirin- discuss risks/benefit, given she is > 70 and has no history of ASCVD the benefit in primary prevention is limited -Current treatment: Atorvastatin 10 mg daily - Appropriate, Effective, Safe, Accessible Aspirin 81 mg daily - Query appropriate -Educated on Cholesterol goals; Benefits of statin for ASCVD risk reduction; -Advised she can stop aspirin; continue statin  Swelling (Goal: manage symptoms) -Controlled - takes diuretic PRN very rarely, once every few months -Current treatment  Chlorthalidone 25 mg - 2 tab PRN - Appropriate, Effective, Safe, Accessible -Recommended to continue current medication  Depression/Anxiety (Goal: manage symptoms) -Controlled - pt is no longer taking alprazolam; she started Lexapro in 2022 and reports it has been controlling anxiety well; pt lost her husband in 2014 and thinks some of her stress is related to being alone -PHQ9: 0 (07/2021) - no/minimal depression -GAD7: 12 (06/2021) - moderate anxiety -Current treatment: Escitalopram 5 mg daily - Appropriate, Effective, Safe, Accessible Alprazolam 0.25 mg  HS prn - not taking, removed from list -Medications previously tried/failed: n/a -Connected with PCP for mental health support -Educated on Benefits of medication for symptom control -Recommended to continue current medication  Osteoporosis  (Goal prevent fractures) -Controlled - pt is up to date with Prolia; she is not currently taking calcium -Last DEXA Scan: 07/2020 (improved since 07/2018)  T-Score femoral neck: -2.4  T-Score total hip: -2.2  T-Score lumbar spine: -2.2 -Current treatment  Prolia (10/2018 - 11/30/21) - Appropriate, Effective, Safe,  Accessible Calcium - not taking Vitamin D 2000 IU - Appropriate, Effective, Safe, Accessible -Medications previously tried: alendronate (GI) -Discussed typical initial length of therapy for Prolia is 3-5 years, she will get next DEXA scan Aug 2023 and will decide treatment course at that time -Recommend 1200 mg of calcium daily from dietary and supplemental sources. -Recommended to continue current medication  Hypothyroidism (Goal: maintain TSH in goal range) -Controlled - TSH most recently at goal; pt reports jitteriness improved with most recent dose reduction; she takes levothyroxine by itself, waits 30 min before other meds -Current treatment  Levothyroxine 75 mcg daily - Appropriate, Effective, Safe, Accessible -Discussed TSH interpretation/relation to thyroid levels;  -Recommended to continue current medication  GERD (Goal: manage symptoms) -Controlled - per pt report -Current treatment  Famotidine 20 mg daily prn -Appropriate, Effective, Safe, Accessible -Recommended to continue current medication  IBS(Goal: manage symptoms) -Controlled - pt reports she takes dicyclomine most days and it is very helpful -Current treatment  Dicyclomine 10 mg BID prn -Appropriate, Effective, Safe, Accessible -Recommended to continue current medication  Allergic rhinitis (Goal: manage symptoms) -Controlled - pt reports some OTC allergy/cough medications have made her heart race in the past; she is not currently taking any antihistamines -Current treatment  Fluticasone nasal spray -Appropriate, Effective, Safe, Accessible Loratadine 10 mg - not taking -Recommended to continue current medication  Migraines (Goal: abortive therapy) -Controlled - pt reports she gets a migraine every few months and takes Excedrin which is very helpful (she verified she has classic Excedrin with aspirin, caffeine, APAP) -Current treatment  Excedrin PRN - Appropriate, Effective, Safe, Accessible -Discussed that  since she is using Excedrin so infrequently is poses very little risk -Recommended to continue current medication  Health Maintenance -Vaccine gaps: Flu, covid booster -Pt has flu shot 09/08/21 (updated chart); discussed benefits of new bivalent covid booster, pt is still undecided -Current therapy:  Vitamin B12 Biotin Multivitamin Voltaren gel Naproxen PRN -Patient is satisfied with current therapy and denies issues -Recommended to continue current medication  Patient Goals/Self-Care Activities Patient will:  - take medications as prescribed as evidenced by patient report and record review focus on medication adherence by routine -Restart calcium 600 mg/day - can be combination with Vitamin D -Can stop aspirin       Ms. Hagin was given information about Chronic Care Management services today including:  CCM service includes personalized support from designated clinical staff supervised by her physician, including individualized plan of care and coordination with other care providers 24/7 contact phone numbers for assistance for urgent and routine care needs. Standard insurance, coinsurance, copays and deductibles apply for chronic care management only during months in which we provide at least 20 minutes of these services. Most insurances cover these services at 100%, however patients may be responsible for any copay, coinsurance and/or deductible if applicable. This service may help you avoid the need for more expensive face-to-face services. Only one practitioner may furnish and bill the service in a calendar month.  The patient may stop CCM services at any time (effective at the end of the month) by phone call to the office staff.  Patient agreed to services and verbal consent obtained.   Patient verbalizes understanding of instructions and care plan provided today and agrees to view in [place]. Active MyChart status confirmed with patient.   Telephone follow up appointment  with pharmacy team member scheduled for: 1 year  Charlene Brooke, PharmD, [facility] Clinical Pharmacist [facility] [phone]

## 2022-01-08 NOTE — Progress Notes (Signed)
Chronic Care Management Pharmacy Note  01/08/2022 Name:  Gabrielle Webb MRN:  [id number] DOB:  05/25/1941  Summary: -Initial CCM visit: pt endorses compliance with medications as prescribed -Pt wants to know if she still needs to take aspirin; per chart review she has no history of ASCVD; she is over 70 and based on ASPREE trial results there is no established benefit for aspirin in primary prevention in this population (a recent study also showed no benefit for preventing breast cancer recurrence) -She is no longer taking calcium  Recommendations/Changes made from today's visit: -Advised she can stop aspirin  -Recommended 1200 mg of calcium per day from dietary and supplementary sources  Plan: -[name] will call patient 3 months for general adherence -Pharmacist follow up televisit scheduled for 1 year -PCP annual visit 07/04/22    Subjective: Gabrielle Webb is an 81 y.o. year old female who is a primary patient of Tower, Wynelle Fanny, MD.  The CCM team was consulted for assistance with disease management and care coordination needs.    Engaged with patient by telephone for initial visit in response to provider referral for pharmacy case management and/or care coordination services.   Consent to Services:  The patient was given the following information about Chronic Care Management services today, agreed to services, and gave verbal consent: 1. CCM service includes personalized support from designated clinical staff supervised by the primary care provider, including individualized plan of care and coordination with other care providers 2. 24/7 contact phone numbers for assistance for urgent and routine care needs. 3. Service will only be billed when office clinical staff spend 20 minutes or more in a month to coordinate care. 4. Only one practitioner may furnish and bill the service in a calendar month. 5.The patient may stop CCM services at any time (effective at the end of  the month) by phone call to the office staff. 6. The patient will be responsible for cost sharing (co-pay) of up to 20% of the service fee (after annual deductible is met). Patient agreed to services and consent obtained.  Patient Care Team: Tower, Wynelle Fanny, MD as PCP - General (Family Medicine) Magrinat, Virgie Dad, MD (Inactive) as Consulting Physician (Oncology) Dian Queen, MD as Consulting Physician (Obstetrics and Gynecology) Eula Flax, OD as Referring Physician (Ophthalmology) Clyde Canterbury, MD as Referring Physician (Otolaryngology) Oneta Rack, MD as Consulting Physician (Dermatology) Ocie Cornfield, DDS as Consulting Physician (Dentistry) Rockey Situ Kathlene November, MD as Consulting Physician (Cardiology) Charlton Haws, [facility] as Pharmacist (Pharmacist)  Recent office visits: 07/03/21-PCP-Marne Tower,MD- Patient presented for AWV.Discussed routine screenings, vaccines, TSH ordered, Nasal saline is helpful for congestion and has less side effects    06/09/21 Dr Glori Bickers OV: f/u anxiety, lightheaded. Started escitalopram 5 mg daily. Reduced levothyroxine to 75 mcg (TSH 0.17); Referred to endocrine.  Recent consult visits: 11/07/21-Ophthalmology-Chadwick Brasington - eye exam 08/30/21-Dermatology-Arin Isenstein- f/u [facility] 08/16/21-[facility]-Jeffrey Byrnett,MD- Patient presented for colonoscopy procedure. 08/14/21-Oncology-Gustav Magrinat,MD-Patient presented for follow up estrogen receptor positive breast cancer. No evidence of disease recurrence. 08/09/21-Dermatology-Arin Isenstein-f/u basal cell carcinoma 08/08/21-Endocrinology- Virgina Organ presented for consult for hypothyroidism and osteoporosis.Labs ordered(normal result) 07/26/21-Dermatology-Arin Isenstein-insect bite 07/18/21-Gastroenterology-Jeffrey Byrnett,MD-Patient presented for pre-op for colonoscopy-stop fish oil for 1 week,miralax colonoscopy preparation given. 07/12/21-OB/GYN-Michelle Grewal,MD- PAP smear, Pelvic/breast  exam  Hospital visits: None in previous 6 months   Objective:  Lab Results  Component Value Date   CREATININE 0.74 11/28/2021   BUN 12 11/28/2021   GFR 77.43 11/28/2021   EGFR  82 (L) 07/03/2016   GFRNONAA >60 08/14/2021   GFRAA >60 08/15/2020   NA 141 11/28/2021   K 4.1 11/28/2021   CALCIUM 8.9 11/28/2021   CO2 29 11/28/2021   GLUCOSE 83 11/28/2021    Lab Results  Component Value Date/Time   GFR 77.43 11/28/2021 10:03 AM   GFR 81.67 05/23/2021 07:59 AM    Last diabetic Eye exam: No results found for: HMDIABEYEEXA  Last diabetic Foot exam: No results found for: HMDIABFOOTEX   Lab Results  Component Value Date   CHOL 148 05/23/2021   HDL 59.90 05/23/2021   LDLCALC 71 05/23/2021   LDLDIRECT 142.0 03/19/2011   TRIG 87.0 05/23/2021   CHOLHDL 2 05/23/2021    Hepatic Function Latest Ref Rng & Units 08/14/2021 05/23/2021 08/15/2020  Total Protein 6.5 - 8.1 g/dL 6.9 6.2 7.0  Albumin 3.5 - 5.0 g/dL 4.0 4.2 4.0  AST 15 - 41 U/L _0 ALT 0 - 44 U/L _1 Alk Phosphatase 38 - 126 U/L 56 45 53  Total Bilirubin 0.3 - 1.2 mg/dL 0.7 0.8 0.5  Bilirubin, Direct 0.0 - 0.3 mg/dL - 0.2 -    Lab Results  Component Value Date/Time   TSH 0.88 07/03/2021 09:46 AM   TSH 0.17 (L) 05/23/2021 07:59 AM   FREET4 1.8 (H) 11/04/2009 10:15 AM   FREET4 1.0 08/20/2008 12:23 PM    CBC Latest Ref Rng & Units 08/14/2021 05/23/2021 08/15/2020  WBC 4.0 - 10.5 K/uL 6.9 6.3 7.0  Hemoglobin 12.0 - 15.0 g/dL 13.2 13.1 13.4  Hematocrit 36.0 - 46.0 % 39.1 38.3 40.5  Platelets 150 - 400 K/uL 169 159.0 157    Lab Results  Component Value Date/Time   VD25OH 44.43 05/23/2021 07:59 AM   VD25OH 60.47 06/13/2020 08:05 AM    Clinical ASCVD: No  The 10-year ASCVD risk score (Arnett DK, et al., 2019) is: 33.8%   Values used to calculate the score:     Age: 81 years     Sex: Female     Is Non-Hispanic African American: No     Diabetic: No     Tobacco smoker: No     Systolic Blood Pressure:  141 mmHg     Is BP treated: Yes     HDL Cholesterol: 59.9 mg/dL     Total Cholesterol: 148 mg/dL    Depression screen [facility] 2/9 07/03/2021 06/09/2021 06/22/2020  Decreased Interest 0 - 0  Down, Depressed, Hopeless 0 - 0  PHQ - 2 Score 0 - 0  Altered sleeping - 3 0  Tired, decreased energy - 3 0  Change in appetite - - 0  Feeling bad or failure about yourself  - 0 0  Trouble concentrating - 0 0  Moving slowly or fidgety/restless - - 0  Suicidal thoughts - 0 0  PHQ-9 Score - - 0  Difficult doing work/chores - Not difficult at all Not difficult at all  Some recent data might be hidden     Social History   Tobacco Use  Smoking Status Never  Smokeless Tobacco Never   BP Readings from Last 3 Encounters:  08/16/21 (!) 141/68  08/14/21 100/60  07/03/21 112/66   Pulse Readings from Last 3 Encounters:  08/16/21 72  08/14/21 68  07/03/21 67   Wt Readings from Last 3 Encounters:  08/16/21 138 lb (62.6 kg)  08/14/21 137 lb 8 oz (62.4 kg)  07/03/21 138 lb (62.6 kg)   BMI Readings  from Last 3 Encounters:  08/16/21 24.45 kg/m  08/14/21 24.36 kg/m  07/03/21 24.45 kg/m    Assessment/Interventions: Review of patient past medical history, allergies, medications, health status, including review of consultants reports, laboratory and other test data, was performed as part of comprehensive evaluation and provision of chronic care management services.   SDOH:  (Social Determinants of Health) assessments and interventions performed: Yes SDOH Interventions    Flowsheet Row Most Recent Value  SDOH Interventions   Food Insecurity Interventions Intervention Not Indicated  Financial Strain Interventions Intervention Not Indicated      SDOH Screenings   Alcohol Screen: Not on file  Depression (PHQ2-9): Low Risk    PHQ-2 Score: 0  Financial Resource Strain: Low Risk    Difficulty of Paying Living Expenses: Not very hard  Food Insecurity: No Food Insecurity   Worried About Ship broker in the Last Year: Never true   Ran Out of Food in the Last Year: Never true  Housing: Not on file  Physical Activity: Not on file  Social Connections: Not on file  Stress: Not on file  Tobacco Use: Low Risk    Smoking Tobacco Use: Never   Smokeless Tobacco Use: Never   Passive Exposure: Not on file  Transportation Needs: Not on file    [place]  Allergies  Allergen Reactions   Codeine Nausea And Vomiting   Fentanyl Nausea And Vomiting   Hydromorphone Nausea And Vomiting        Morphine Nausea And Vomiting   Ciprofloxacin     Intolerant- tingling    Tape     Surgical tape   Prednisone Palpitations    Rapid hear beat    Medications Reviewed Today     Reviewed by Charlton Haws, [facility] (Pharmacist) on 01/08/22 at [place] List Status: <None>   Medication Order Taking? Sig Documenting Provider Last Dose Status Informant  atorvastatin (LIPITOR) 10 MG tablet [id number] Yes Take 1 tablet (10 mg total) by mouth daily. Tower, Wynelle Fanny, MD Taking Active   BIOTIN PO [id number] Yes Take by mouth daily. [provider] Taking Active Self  CALCIUM PO [id number] No Take 1,500 mg by mouth daily.  Patient not taking: Reported on 01/08/2022   [provider] Not Taking Active Self  chlorthalidone (HYGROTON) 25 MG tablet [id number] Yes TAKE TWO TABLETS EVERY DAY AS NEEDED Velta Addison, Wynelle Fanny, MD Taking Active   Cholecalciferol (VITAMIN D) 2000 UNITS CAPS [id number] Yes Take 1 capsule by mouth daily. [provider] Taking Active Self  Cyanocobalamin (B-12 PO) [id number] Yes Take 2,500 mcg by mouth daily. [provider] Taking Active Self  denosumab (PROLIA) 60 MG/ML SOSY injection [id number] Yes Inject 60 mg into the skin every 6 (six) months. Tower, Wynelle Fanny, MD Taking Active   diclofenac sodium (VOLTAREN) 1 % GEL [id number] Yes Apply 2 g topically 4 (four) times daily. Rub into affected area of foot 2 to 4 times daily  Patient taking  differently: Apply 2 g topically as needed. Rub into affected area of foot 2 to 4 times daily   Trula Slade, DPM Taking Active Self  dicyclomine (BENTYL) 10 MG capsule [id number] Yes TAKE 1 CAPSULE BY MOUTH TWICE DAILY FOR SPASMS Tower, Wynelle Fanny, MD Taking Active   escitalopram (LEXAPRO) 5 MG tablet [id number] Yes Take 1 tablet (5 mg total) by mouth daily. Tower, Wynelle Fanny, MD Taking Active   famotidine (PEPCID) 20 MG tablet [id number] Yes TAKE  ONE TABLET EVERY DAY AS NEEDED FOR HEARTBURN OR INDIGESTION Tower, Wynelle Fanny, MD Taking Active   fluticasone (FLONASE) 50 MCG/ACT nasal spray [id number] Yes TWO SPRAYS IN EACH NOSTRIL EVERY DAY Tower, Wynelle Fanny, MD Taking Active   levothyroxine (SYNTHROID) 75 MCG tablet [id number] Yes TAKE ONE TABLET BY MOUTH DAILY BEFORE BREAKFAST Tower, Wynelle Fanny, MD Taking Active   loratadine (CLARITIN) 10 MG tablet [id number] No Take 10 mg by mouth daily.  Patient not taking: Reported on 01/08/2022   [provider] Not Taking Active   Multiple Vitamin (MULTIVITAMIN) capsule [id number] Yes Take 1 capsule by mouth daily. [provider] Taking Active Self  Naproxen Sodium (ALEVE PO) [id number] Yes Take 1 tablet by mouth as needed. [provider] Taking Active   Omega-3 Fatty Acids (FISH OIL PO) [id number] Yes Take 1 capsule by mouth daily. [provider] Taking Active             Patient Active Problem List   Diagnosis Date Noted   Encounter for screening mammogram for breast cancer 12/18/2021   Light headed 06/10/2021   Anxious mood as adjustment reaction 06/09/2021   Hematuria 11/02/2020   Breast lump on right side at 8 o'clock position 05/03/2020   History of breast cancer 05/03/2020   Medicare annual wellness visit, subsequent 05/28/2018   Estrogen deficiency 05/28/2018   Fatigue 05/22/2017   Chest discomfort 05/22/2017   S/P TAH-BSO 11/08/2016   Routine general medical examination at a health care facility 05/06/2016    Malignant neoplasm of upper-outer quadrant of right breast in female, estrogen receptor positive ([place]) 03/21/2015   History of tamoxifen therapy 05/18/2014   Encounter for routine gynecological examination 05/06/2013   Colon cancer screening 05/06/2013   Encounter for Medicare annual wellness exam 04/28/2013   Hx of radiation therapy    Gynecological examination 12/25/2011   Hx of Breast cancer, stage 1, Right, UOQ, Receptor+,Her2- 05/29/2011    Class: Stage 1   Other screening mammogram 03/21/2011   Post-menopausal 03/21/2011   Hypothyroidism 01/23/2008   Pure hypercholesterolemia 01/23/2008   ALLERGIC RHINITIS 01/23/2008   GERD 01/23/2008   DIVERTICULOSIS, COLON 01/23/2008   IBS 01/23/2008   FIBROCYSTIC BREAST DISEASE 01/23/2008   Osteoporosis 10/17/2007    Immunization History  Administered Date(s) Administered   Fluad Quad(high Dose 65+) 08/27/2019   Influenza Split 09/17/2011   Influenza Whole 10/17/2007, 09/27/2008, 09/23/2009   Influenza, High Dose Seasonal PF 09/19/2015, 08/26/2017, 08/23/2020, 09/08/2021   Influenza-Unspecified 09/01/2013, 10/19/2014, 08/24/2016, 10/21/2018   PFIZER(Purple Top)SARS-COV-2 Vaccination 12/29/2019, 01/19/2020, 09/15/2020   Pneumococcal Conjugate-13 05/13/2015   Pneumococcal Polysaccharide-23 12/16/2006, 05/11/2014   Td 12/03/2005   Tdap 12/10/2017   Zoster Recombinat (Shingrix) 05/21/2018, 08/12/2018   Zoster, Live 12/03/2007    Conditions to be addressed/monitored:  Hyperlipidemia, GERD, Hypothyroidism, Anxiety, Osteoporosis, and Allergic Rhinitis  Care Plan : [employer]  Updates made by Charlton Haws, [place] since 01/08/2022 12:00 AM     Problem: Hyperlipidemia, GERD, Hypothyroidism, Anxiety, Osteoporosis, and Allergic Rhinitis   Priority: High     Long-Range Goal: Disease mgmt   Start Date: 01/08/2022  Expected End Date: 01/08/2023  This Visit's Progress: On track  Priority: High  Note:   Current Barriers:   Unable to independently monitor therapeutic efficacy  Pharmacist Clinical Goal(s):  Patient will achieve adherence to monitoring guidelines and medication adherence to achieve therapeutic efficacy through collaboration with PharmD and provider.   Interventions: 1:1 collaboration with Tower, Wynelle Fanny, MD regarding development and  update of comprehensive plan of care as evidenced by provider attestation and co-signature Inter-disciplinary care team collaboration (see longitudinal plan of care) Comprehensive medication review performed; medication list updated in electronic medical record  Hyperlipidemia: (LDL goal < 100) -Controlled - LDL is at goal; pt is wondering if she should still be taking aspirin- discuss risks/benefit, given she is > 70 and has no history of ASCVD the benefit in primary prevention is limited -Current treatment: Atorvastatin 10 mg daily - Appropriate, Effective, Safe, Accessible Aspirin 81 mg daily - Query appropriate -Educated on Cholesterol goals; Benefits of statin for ASCVD risk reduction; -Advised she can stop aspirin; continue statin  Swelling (Goal: manage symptoms) -Controlled - takes diuretic PRN very rarely, once every few months -Current treatment  Chlorthalidone 25 mg - 2 tab PRN - Appropriate, Effective, Safe, Accessible -Recommended to continue current medication  Depression/Anxiety (Goal: manage symptoms) -Controlled - pt is no longer taking alprazolam; she started Lexapro in 2022 and reports it has been controlling anxiety well; pt lost her husband in 2014 and thinks some of her stress is related to being alone -PHQ9: 0 (07/2021) - no/minimal depression -GAD7: 12 (06/2021) - moderate anxiety -Current treatment: Escitalopram 5 mg daily - Appropriate, Effective, Safe, Accessible Alprazolam 0.25 mg HS prn - not taking, removed from list -Medications previously tried/failed: n/a -Connected with PCP for mental health support -Educated on Benefits of  medication for symptom control -Recommended to continue current medication  Osteoporosis  (Goal prevent fractures) -Controlled - pt is up to date with Prolia; she is not currently taking calcium -Last DEXA Scan: 07/2020 (improved since 07/2018)  T-Score femoral neck: -2.4  T-Score total hip: -2.2  T-Score lumbar spine: -2.2 -Current treatment  Prolia (10/2018 - 11/30/21) - Appropriate, Effective, Safe, Accessible Calcium - not taking Vitamin D 2000 IU - Appropriate, Effective, Safe, Accessible -Medications previously tried: alendronate (GI) -Discussed typical initial length of therapy for Prolia is 3-5 years, she will get next DEXA scan Aug 2023 and will decide treatment course at that time -Recommend 1200 mg of calcium daily from dietary and supplemental sources. -Recommended to continue current medication  Hypothyroidism (Goal: maintain TSH in goal range) -Controlled - TSH most recently at goal; pt reports jitteriness improved with most recent dose reduction; she takes levothyroxine by itself, waits 30 min before other meds -Current treatment  Levothyroxine 75 mcg daily - Appropriate, Effective, Safe, Accessible -Discussed TSH interpretation/relation to thyroid levels;  -Recommended to continue current medication  GERD (Goal: manage symptoms) -Controlled - per pt report -Current treatment  Famotidine 20 mg daily prn -Appropriate, Effective, Safe, Accessible -Recommended to continue current medication  IBS(Goal: manage symptoms) -Controlled - pt reports she takes dicyclomine most days and it is very helpful -Current treatment  Dicyclomine 10 mg BID prn -Appropriate, Effective, Safe, Accessible -Recommended to continue current medication  Allergic rhinitis (Goal: manage symptoms) -Controlled - pt reports some OTC allergy/cough medications have made her heart race in the past; she is not currently taking any antihistamines -Current treatment  Fluticasone nasal spray -Appropriate,  Effective, Safe, Accessible Loratadine 10 mg - not taking -Recommended to continue current medication  Migraines (Goal: abortive therapy) -Controlled - pt reports she gets a migraine every few months and takes Excedrin which is very helpful (she verified she has classic Excedrin with aspirin, caffeine, APAP) -Current treatment  Excedrin PRN - Appropriate, Effective, Safe, Accessible -Discussed that since she is using Excedrin so infrequently is poses very little risk -Recommended to continue current medication  Health Maintenance -Vaccine gaps: Flu, covid booster -Pt has flu shot 09/08/21 (updated chart); discussed benefits of new bivalent covid booster, pt is still undecided -Current therapy:  Vitamin B12 Biotin Multivitamin Voltaren gel Naproxen PRN -Patient is satisfied with current therapy and denies issues -Recommended to continue current medication  Patient Goals/Self-Care Activities Patient will:  - take medications as prescribed as evidenced by patient report and record review focus on medication adherence by routine -Restart calcium 600 mg/day - can be combination with Vitamin D -Can stop aspirin       Medication Assistance: None required.  Patient affirms current coverage meets needs.  Compliance/Adherence/Medication fill history: Care Gaps: None  Star-Rating Drugs: Atorvastatin - LF 06/26/21 x 90 ds; [place] 45%  Patient's preferred pharmacy is:  [facility], [place] - [address] [place] [address] Phone: [phone] Fax: [phone]  [place], [place] [place] [address] Phone: [phone] Fax: [phone]  Uses pill box? No -   Pt endorses 100% compliance  We discussed: Current pharmacy is preferred with insurance plan and patient is satisfied with pharmacy services Patient decided to: Continue current medication management  strategy  Care Plan and Follow Up Patient Decision:  Patient agrees to Care Plan and Follow-up.  Plan: Telephone follow up appointment with care management team member scheduled for:  1 year  Charlene Brooke, PharmD, [facility] Clinical Pharmacist [facility] at [facility] [phone]

## 2022-01-30 DIAGNOSIS — E7800 Pure hypercholesterolemia, unspecified: Secondary | ICD-10-CM | POA: Diagnosis not present

## 2022-01-30 DIAGNOSIS — E039 Hypothyroidism, unspecified: Secondary | ICD-10-CM

## 2022-01-30 DIAGNOSIS — M810 Age-related osteoporosis without current pathological fracture: Secondary | ICD-10-CM | POA: Diagnosis not present

## 2022-02-02 DIAGNOSIS — C44619 Basal cell carcinoma of skin of left upper limb, including shoulder: Secondary | ICD-10-CM | POA: Diagnosis not present

## 2022-02-07 DIAGNOSIS — M810 Age-related osteoporosis without current pathological fracture: Secondary | ICD-10-CM | POA: Diagnosis not present

## 2022-02-07 DIAGNOSIS — E039 Hypothyroidism, unspecified: Secondary | ICD-10-CM | POA: Diagnosis not present

## 2022-03-16 DIAGNOSIS — C44612 Basal cell carcinoma of skin of right upper limb, including shoulder: Secondary | ICD-10-CM | POA: Diagnosis not present

## 2022-03-16 DIAGNOSIS — D0472 Carcinoma in situ of skin of left lower limb, including hip: Secondary | ICD-10-CM | POA: Diagnosis not present

## 2022-04-10 ENCOUNTER — Other Ambulatory Visit: Payer: Self-pay

## 2022-04-10 DIAGNOSIS — M810 Age-related osteoporosis without current pathological fracture: Secondary | ICD-10-CM

## 2022-04-10 NOTE — Chronic Care Management (AMB) (Signed)
Chronic Care Management Pharmacy Assistant   Name: Gabrielle Webb  MRN: [id number] DOB: 10/03/1941   Reason for Encounter:General Adherence     Recent office visits:  None since last CCM contact  Recent consult visits:  02/02/22-Gabrielle Webb(Derm)-no data found  Hospital visits:  None in previous 6 months  Medications: Outpatient Encounter Medications as of 04/10/2022  Medication Sig   atorvastatin (LIPITOR) 10 MG tablet Take 1 tablet (10 mg total) by mouth daily.   BIOTIN PO Take by mouth daily.   CALCIUM PO Take 1,500 mg by mouth daily. (Patient not taking: Reported on 01/08/2022)   chlorthalidone (HYGROTON) 25 MG tablet TAKE TWO TABLETS EVERY DAY AS NEEDED FORSWELLING   Cholecalciferol (VITAMIN D) 2000 UNITS CAPS Take 1 capsule by mouth daily.   Cyanocobalamin (B-12 PO) Take 2,500 mcg by mouth daily.   denosumab (PROLIA) 60 MG/ML SOSY injection Inject 60 mg into the skin every 6 (six) months.   diclofenac sodium (VOLTAREN) 1 % GEL Apply 2 g topically 4 (four) times daily. Rub into affected area of foot 2 to 4 times daily (Patient taking differently: Apply 2 g topically as needed. Rub into affected area of foot 2 to 4 times daily)   dicyclomine (BENTYL) 10 MG capsule TAKE 1 CAPSULE BY MOUTH TWICE DAILY FOR SPASMS   escitalopram (LEXAPRO) 5 MG tablet Take 1 tablet (5 mg total) by mouth daily.   famotidine (PEPCID) 20 MG tablet TAKE ONE TABLET EVERY DAY AS NEEDED FOR HEARTBURN OR INDIGESTION   fluticasone (FLONASE) 50 MCG/ACT nasal spray TWO SPRAYS IN EACH NOSTRIL EVERY DAY   levothyroxine (SYNTHROID) 75 MCG tablet TAKE ONE TABLET BY MOUTH DAILY BEFORE BREAKFAST   loratadine (CLARITIN) 10 MG tablet Take 10 mg by mouth daily. (Patient not taking: Reported on 01/08/2022)   Multiple Vitamin (MULTIVITAMIN) capsule Take 1 capsule by mouth daily.   Naproxen Sodium (ALEVE PO) Take 1 tablet by mouth as needed.   Omega-3 Fatty Acids (FISH OIL PO) Take 1 capsule by mouth daily.   No  facility-administered encounter medications on file as of 04/10/2022.       Contacted Gabrielle Webb on 04/13/22 for general disease state and medication adherence call.   Patient is not more than 5 days past due for refill on the following medications per chart history:  Star Medications: Medication Name/mg Last Fill Days Supply Atorvastatin '10mg'$   03/08/22  90    What concerns do you have about your medications?  The 6 month Prolia shot is due in June with PCP, patient requests the prescription be sent to [facility] in June for this. It is a process to get the injection.  The patient denies side effects with their medications.   How often do you forget or accidentally miss a dose? Never  Do you use a pillbox? No  straight out of the bottle and she will place the bottles accordingly   Are you having any problems getting your medications from your pharmacy? No  uses [facility] for all medications   Has the cost of your medications been a concern? The prolia is expensive,but every 6 months is not bad.  Since last visit with CPP, the following interventions have been made. Patient has added calcium to her diet .  The patient has not had an ED visit since last contact.   The patient denies problems with their health.   Patient denies concerns or questions for Gabrielle Webb, PharmD at this time.  Counseled patient on:  Gabrielle Webb job taking medications, Importance of taking medication daily without missed doses, and Access to CCM team for any cost, medication or pharmacy concerns.   Care Gaps: Annual wellness visit in last year? Yes Most Recent BP reading:112/66  67-P  07/03/21   Upcoming appointments: PCP appointment on 07/04/22 and CCM appointment on 01/2023  Gabrielle Webb, CPP notified  Gabrielle Webb, [place]  [phone]

## 2022-04-19 ENCOUNTER — Other Ambulatory Visit: Payer: Self-pay | Admitting: Family Medicine

## 2022-05-03 MED ORDER — PROLIA 60 MG/ML ~~LOC~~ SOSY: 60.0000 mg | PREFILLED_SYRINGE | SUBCUTANEOUS | 0 refills | Status: DC

## 2022-05-03 NOTE — Addendum Note (Signed)
Addended by: Charlton Haws on: 05/03/2022 05:03 PM   Modules accepted: Orders

## 2022-05-03 NOTE — Telephone Encounter (Signed)
Prolia injection will be due end of June (last given 11/30/21). Pt requests the prescription be ordered to her pharmacy ahead of time as it is always a process to get it in.  Routing to PCP for approval.

## 2022-05-03 NOTE — Addendum Note (Signed)
Addended by: Loura Pardon A on: 05/03/2022 05:43 PM   Modules accepted: Orders

## 2022-05-07 ENCOUNTER — Encounter: Payer: Self-pay | Admitting: Family Medicine

## 2022-05-07 ENCOUNTER — Telehealth: Payer: Self-pay | Admitting: Family Medicine

## 2022-05-07 DIAGNOSIS — M818 Other osteoporosis without current pathological fracture: Secondary | ICD-10-CM

## 2022-05-07 NOTE — Telephone Encounter (Signed)
Pt called and said that she has her prolia shot scheduled for the end of the month and that they usually come out to her car and dont charge for the visit because she brings from the pharmacy, Is this okay for this time as well? She would like a call back to confirm. Call back [phone]

## 2022-05-07 NOTE — Telephone Encounter (Signed)
BMET ordered for prolia Thanks for letting me know

## 2022-05-07 NOTE — Telephone Encounter (Signed)
Called and spoke with pt informed that this okay , also informed pt that she will need to have lab work prior to having her injection made her an appt on 05/10/22. Pt also wanted you know that her mother passed away

## 2022-05-08 ENCOUNTER — Telehealth: Payer: Self-pay | Admitting: Cardiology

## 2022-05-08 NOTE — Telephone Encounter (Signed)
Pt req call back to get information on email address to send imaging from her dentist.

## 2022-05-08 NOTE — Telephone Encounter (Signed)
Called and spoke with patient.   Advised patient that we do not have an email address.   Asked patient what she was wanting to email to [name]. Patient reports that every 5 years her dentist takes a picture of her entire mouth and when it was taken this year it showed a "possible calcification" on the right side, and she was advised to see cardiology.   Pt already scheduled for 6/14 as new patient as she hasn't been seen in office in 5 years. Advised patient to bring picture with her.   Pt verbalized understanding and voiced appreciation for the call.

## 2022-05-10 ENCOUNTER — Other Ambulatory Visit (INDEPENDENT_AMBULATORY_CARE_PROVIDER_SITE_OTHER): Payer: PPO

## 2022-05-10 DIAGNOSIS — M818 Other osteoporosis without current pathological fracture: Secondary | ICD-10-CM

## 2022-05-10 LAB — BASIC METABOLIC PANEL
BUN: 14 mg/dL (ref 6–23)
CO2: 28 mEq/L (ref 19–32)
Calcium: 9.2 mg/dL (ref 8.4–10.5)
Chloride: 105 mEq/L (ref 96–112)
Creatinine, Ser: 0.74 mg/dL (ref 0.40–1.20)
GFR: 77.19 mL/min (ref >60.00)
Glucose, Bld: 87 mg/dL (ref 70–99)
Potassium: 3.9 mEq/L (ref 3.5–5.1)
Sodium: 140 mEq/L (ref 135–145)

## 2022-05-11 ENCOUNTER — Telehealth: Payer: Self-pay

## 2022-05-11 NOTE — Telephone Encounter (Signed)
RX for Prolia was already sent to [facility] CrCl is 61.92 mL/min, Calcium 9.2 normal Lab was done on 05/10/22 [place] on 05/31/22

## 2022-05-15 NOTE — Progress Notes (Unsigned)
Cardiology Office Note  Date:  05/16/2022   ID:  Gabrielle Webb, Gabrielle Webb 10/17/1941, MRN [id number]  PCP:  Gabrielle Greenspan, MD   Chief Complaint  Patient presents with   New Patient (Initial Visit)    Ref by Gabrielle Webb, D.D.S. for evaluation of calcification seen on a dental x-ray. Patient had a calcium score in 2018. Patient c/o having a "flip flopping" sensation in her chest about 2 months ago. Medications reviewed by the patient verbally.     HPI:  [name] is a pleasant 81 year old woman with history of Anxiety, uses Xanax as needed Nonsmoker Family history of coronary artery disease History of breast cancer on the right, XRT on the right, lumpectomy Previously seen 2018 for chest pain and shortness of breath symptoms Who presents for new patient evaluation for dental x-ray showing calcification right side of neck  Stress, lost mother 4/23 Has been a stressful time, not eating as much, might be losing weight  Recently seen by the dentist, they did x-ray, finding of small punctate region of calcification left carotid possibly She is concerned  2018 calcium score reviewed, nervous that it may have changed Coronary calcium score of 0. This was 0 percentile for age and sex matched control.  Denies significant chest pain, palpitations, no near-syncope or syncope, No regular exercise program but denies shortness of breath on exertion Tolerating Lipitor   EKG personally reviewed by myself on todays visit Shows normal sinus rhythm with short run sinus tachycardia/atrial tachycardia rate 106 bpm, PACs  Lab work reviewed with her in detail Total chol 210, LDL 128   PMH:   has a past medical history of Allergy, Anxiety, Breast cancer ([place]) (7/12), Cellulitis, Diverticulosis of colon, GERD (gastroesophageal reflux disease), Headache, History of hiatal hernia, radiation therapy (06/20/11 - 07/11/11), Hypothyroid, Osteoporosis, Personal history of radiation therapy (2012), PONV  (postoperative nausea and vomiting), Skin cancer, and Vertigo.  PSH:    Past Surgical History:  Procedure Laterality Date   ABDOMINAL HYSTERECTOMY N/A 11/08/2016   Procedure: HYSTERECTOMY TOTAL  ABDOMINAL;  Surgeon: Dian Queen, MD;  Location: [facility];  Service: Gynecology;  Laterality: N/A;   APPENDECTOMY  07/2007   back sugery  1970   BREAST BIOPSY Left    Benign   BREAST EXCISIONAL BIOPSY Left 1983   BREAST EXCISIONAL BIOPSY Left 1983   BREAST LUMPECTOMY Right 05/16/2011   COLONOSCOPY     COLONOSCOPY WITH PROPOFOL N/A 08/27/2018   Procedure: COLONOSCOPY WITH PROPOFOL;  Surgeon: Manya Silvas, MD;  Location: [facility]COPY;  Service: Endoscopy;  Laterality: N/A;   COLONOSCOPY WITH PROPOFOL N/A 08/16/2021   Procedure: COLONOSCOPY WITH PROPOFOL;  Surgeon: Robert Bellow, MD;  Location: [facility] ENDOSCOPY;  Service: Endoscopy;  Laterality: N/A;   DIAGNOSTIC LAPAROSCOPY     DILATION AND CURETTAGE OF UTERUS N/A 05/31/2014   Procedure: DILATATION AND CURETTAGE WITH ULTRASOUND GUIDANCE;  Surgeon: Cyril Mourning, MD;  Location: [facility];  Service: Gynecology;  Laterality: N/A;   ESOPHAGOGASTRODUODENOSCOPY  04/20/2003   KNEE SURGERY  1962   MASTECTOMY     right eye surgery     SALPINGOOPHORECTOMY Bilateral 11/08/2016   Procedure: BILATERAL SALPINGO OOPHORECTOMY;  Surgeon: Dian Queen, MD;  Location: [facility];  Service: Gynecology;  Laterality: Bilateral;   UPPER GI ENDOSCOPY      Current Outpatient Medications  Medication Sig Dispense Refill   atorvastatin (LIPITOR) 10 MG tablet Take 1 tablet (10 mg total) by mouth daily. 90 tablet 3   BIOTIN  PO Take by mouth daily.     CALCIUM PO Take 1,500 mg by mouth daily.     chlorthalidone (HYGROTON) 25 MG tablet TAKE TWO TABLETS EVERY DAY AS NEEDED FORSWELLING 180 tablet 0   Cholecalciferol (VITAMIN D) 2000 UNITS CAPS Take 1 capsule by mouth daily.     Cyanocobalamin (B-12 PO) Take 2,500 mcg by mouth daily.     denosumab (PROLIA) 60  MG/ML SOSY injection Inject 60 mg into the skin every 6 (six) months. 1 mL 0   diclofenac sodium (VOLTAREN) 1 % GEL Apply 2 g topically 4 (four) times daily. Rub into affected area of foot 2 to 4 times daily (Patient taking differently: Apply 2 g topically as needed. Rub into affected area of foot 2 to 4 times daily) 100 g 2   dicyclomine (BENTYL) 10 MG capsule TAKE 1 CAPSULE BY MOUTH TWICE DAILY FOR SPASMS 180 capsule 2   escitalopram (LEXAPRO) 5 MG tablet Take 1 tablet (5 mg total) by mouth daily. 30 tablet 11   famotidine (PEPCID) 20 MG tablet TAKE ONE TABLET EVERY DAY AS NEEDED FOR HEARTBURN OR INDIGESTION 90 tablet 3   fluticasone (FLONASE) 50 MCG/ACT nasal spray SPRAY TWICE INTO EACH NOSTRIL ONCE DAILY 18 g 2   levothyroxine (SYNTHROID) 75 MCG tablet TAKE ONE TABLET BY MOUTH DAILY BEFORE BREAKFAST 90 tablet 1   loratadine (CLARITIN) 10 MG tablet Take 10 mg by mouth daily.     Multiple Vitamin (MULTIVITAMIN) capsule Take 1 capsule by mouth daily.     Naproxen Sodium (ALEVE PO) Take 1 tablet by mouth as needed.     Omega-3 Fatty Acids (FISH OIL PO) Take 1 capsule by mouth daily.     No current facility-administered medications for this visit.     Allergies:   Codeine, Fentanyl, Hydromorphone, Morphine, Ciprofloxacin, Tape, and Prednisone   Social History:  The patient  reports that she has never smoked. She has never used smokeless tobacco. She reports that she does not drink alcohol and does not use drugs.   Family History:   family history includes Atrial fibrillation in her mother; Cancer in her maternal aunt and maternal uncle; Congenital heart disease in her mother; Coronary artery disease in her mother and another family member; Diabetes in her maternal aunt, maternal uncle, and another family member; Heart attack (age of onset: 84) in her father; Hypertension in her father; Leukemia in an other family member; Uterine cancer in an other family member.    Review of Systems: Review of  Systems  Constitutional: Negative.   HENT: Negative.    Respiratory: Negative.    Cardiovascular: Negative.   Gastrointestinal: Negative.   Musculoskeletal: Negative.   Neurological: Negative.   Psychiatric/Behavioral:  The patient is nervous/anxious.   All other systems reviewed and are negative.    PHYSICAL EXAM: VS:  BP (!) 100/58 (BP Location: Left Arm, Patient Position: Sitting, Cuff Size: Normal)   Pulse (!) 106   Ht '5\' 4"'$  (1.626 m)   Wt 132 lb 8 oz (60.1 kg)   LMP 12/03/1988   SpO2 98%   BMI 22.74 kg/m  , BMI Body mass index is 22.74 kg/m. GEN: Well nourished, well developed, in no acute distress HEENT: normal Neck: no JVD, carotid bruits, or masses Cardiac: RRR; no murmurs, rubs, or gallops,no edema  Respiratory:  clear to auscultation bilaterally, normal work of breathing GI: soft, nontender, nondistended, + BS MS: no deformity or atrophy Skin: warm and dry, no rash Neuro:  Strength and sensation are intact Psych: euthymic mood, full affect   Recent Labs: 07/03/2021: TSH 0.88 08/14/2021: ALT 13; Hemoglobin 13.2; Platelet Count 169 05/10/2022: BUN 14; Creatinine, Ser 0.74; Potassium 3.9; Sodium 140    Lipid Panel Lab Results  Component Value Date   CHOL 148 05/23/2021   HDL 59.90 05/23/2021   LDLCALC 71 05/23/2021   TRIG 87.0 05/23/2021      Wt Readings from Last 3 Encounters:  05/16/22 132 lb 8 oz (60.1 kg)  08/16/21 138 lb (62.6 kg)  08/14/21 137 lb 8 oz (62.4 kg)      ASSESSMENT AND PLAN:  Problem List Items Addressed This Visit   None Visit Diagnoses     PAD (peripheral artery disease) ([place])    -  Primary   Relevant Orders   EKG 12-Lead   Calcification of left carotid artery       Relevant Orders   CT CARDIAC SCORING (SELF PAY ONLY)   VAS US CAROTID   Mixed hyperlipidemia       Anxiety          Carotid calcification Noted on dental x-ray on the left For further evaluation and reassurance for her we have ordered carotid  ultrasound Likely focal punctate region without significant stenosis  Hyperlipidemia Numbers much improved on Lipitor, recommend she stay on the current dose She is concerned about progression of coronary calcification, requesting repeat CT coronary calcium scoring Detailed that prior scoring a 2018 score was 0 and there likely has been minimal progression if any At her request a repeat calcium score has been ordered  Stress/adjustment disorder Recently lost her mother 2 months ago, still trouble adjusting  Palpitations/tachycardia Short run atrial tachycardia rate around 106 bpm noted on EKG, short-lived several seconds then self terminated No significant room on blood pressure to add beta-blockers     Total encounter time more than 60 minutes  Greater than 50% was spent in counseling and coordination of care with the patient    Signed, Esmond Plants, M.D., Ph.D. [place], [place]

## 2022-05-16 ENCOUNTER — Ambulatory Visit: Payer: PPO | Admitting: Cardiovascular Disease

## 2022-05-16 ENCOUNTER — Encounter: Payer: Self-pay | Admitting: Cardiovascular Disease

## 2022-05-16 ENCOUNTER — Ambulatory Visit
Admission: RE | Admit: 2022-05-16 | Discharge: 2022-05-16 | Disposition: A | Discharge status: Home / Self Care | Payer: PPO | Source: Ambulatory Visit | Attending: Cardiovascular Disease | Admitting: Cardiovascular Disease

## 2022-05-16 VITALS — BP 100/58 | HR 106 | Ht 64.0 in | Wt 132.5 lb

## 2022-05-16 DIAGNOSIS — F419 Anxiety disorder, unspecified: Secondary | ICD-10-CM | POA: Diagnosis not present

## 2022-05-16 DIAGNOSIS — I6522 Occlusion and stenosis of left carotid artery: Secondary | ICD-10-CM | POA: Diagnosis not present

## 2022-05-16 DIAGNOSIS — E782 Mixed hyperlipidemia: Secondary | ICD-10-CM | POA: Diagnosis not present

## 2022-05-16 DIAGNOSIS — I739 Peripheral vascular disease, unspecified: Secondary | ICD-10-CM | POA: Diagnosis not present

## 2022-05-16 NOTE — Patient Instructions (Addendum)
CT coronary calcium score for family hx, hyperlipiemia  Carotid u/s for carotid calcification on xray  Medication Instructions:  No changes  If you need a refill on your cardiac medications before your next appointment, please call your pharmacy.   Lab work: No new labs needed  Testing/Procedures:   Your physician has requested that you have a carotid duplex. This test is an ultrasound of the carotid arteries in your neck. It looks at blood flow through these arteries that supply the brain with blood. Allow one hour for this exam. There are no restrictions or special instructions.   2.  We will order CT coronary calcium score  $99 at our [facility] in [place]  Please call Colletta [place] at [phone] to schedule   [address], [place] [address]   Follow-Up: At [facility], you and your health needs are our priority.  As part of our continuing mission to provide you with exceptional heart care, we have created designated Provider Care Teams.  These Care Teams include your primary Cardiologist (physician) and Advanced Practice Providers (APPs -  Physician Assistants and Nurse Practitioners) who all work together to provide you with the care you need, when you need it.  You will need a follow up appointment in 12 months  Providers on your designated Care Team:   Murray Hodgkins, NP Christell Faith, PA-C Cadence Kathlen Mody, [place]  COVID-19 Vaccine Information can be found at: [email or web address].uk For questions related to vaccine distribution or appointments, please email [email or web address] or call [phone].

## 2022-05-23 ENCOUNTER — Telehealth: Payer: Self-pay | Admitting: Emergency Medicine

## 2022-05-23 NOTE — Telephone Encounter (Signed)
Called and spoke with patient. Results reviewed with patient, pt verbalized understanding,  questions (if any) answered.   ?

## 2022-05-23 NOTE — Telephone Encounter (Signed)
-----   Message from Minna Merritts, MD sent at 05/20/2022  9:14 PM EDT ----- CT coronary calcium score Score of 0 Excellent finding, indicating no calcified coronary atherosclerotic plaque noted  No other significant findings on the scan were picked up

## 2022-05-28 ENCOUNTER — Telehealth: Payer: Self-pay

## 2022-05-28 NOTE — Telephone Encounter (Signed)
Patient called back, I rescheduled her for 05/30/22 but she would still like a call back and wanted to know if she can have done at [facility]. Please call back

## 2022-05-28 NOTE — Telephone Encounter (Signed)
Patient is calling in, stating she has a Gabrielle Webb appointment on Thursday. [name] is unable to make the appointment, wanting to know if she can have it rescheduled for the 28th or if she can go to [facility].

## 2022-05-28 NOTE — Telephone Encounter (Signed)
Is this okay, please let me know , thanks.

## 2022-05-29 NOTE — Telephone Encounter (Signed)
Spoke with patient. Last Prolia was done on 12/29 and next injection has to be 6/29 or later. Worked patient in on 06/01/22. Cancelled appointment on 05/30/22

## 2022-05-30 ENCOUNTER — Ambulatory Visit: Payer: PPO

## 2022-05-31 ENCOUNTER — Ambulatory Visit: Payer: PPO

## 2022-06-01 ENCOUNTER — Ambulatory Visit (INDEPENDENT_AMBULATORY_CARE_PROVIDER_SITE_OTHER): Payer: PPO

## 2022-06-01 DIAGNOSIS — M810 Age-related osteoporosis without current pathological fracture: Secondary | ICD-10-CM

## 2022-06-01 MED ORDER — DENOSUMAB 60 MG/ML ~~LOC~~ SOSY
60.0000 mg | PREFILLED_SYRINGE | Freq: Once | SUBCUTANEOUS | Status: AC
  Administered 2022-06-01: 60 mg via SUBCUTANEOUS

## 2022-06-01 NOTE — Progress Notes (Signed)
Per orders of Dr. Beryle Beams signed by Dr Einar Pheasant in her absence, injection of Prolia injection given by Kris Mouton. Patient tolerated injection well.

## 2022-06-09 ENCOUNTER — Other Ambulatory Visit: Payer: Self-pay | Admitting: Family Medicine

## 2022-06-11 NOTE — Telephone Encounter (Signed)
See drug warming with Lexapro and aleve Last office visit 07/03/22 Upcoming appointment 07/04/22

## 2022-06-12 ENCOUNTER — Telehealth: Payer: Self-pay | Admitting: Cardiovascular Disease

## 2022-06-12 NOTE — Telephone Encounter (Signed)
Caller needs to forward an x-ray to Dr. Rockey Situ for his review.  Wants to know if this can be emailed to him.  [facility] is only able to accept faxes which would not be as clear as an emailed copy of the dental x-ray.  Please advise.

## 2022-06-15 ENCOUNTER — Ambulatory Visit (INDEPENDENT_AMBULATORY_CARE_PROVIDER_SITE_OTHER): Payer: PPO

## 2022-06-15 DIAGNOSIS — I6522 Occlusion and stenosis of left carotid artery: Secondary | ICD-10-CM | POA: Diagnosis not present

## 2022-06-15 NOTE — Telephone Encounter (Signed)
Minna Merritts, MD     If they are able to download it to their computer as a PDF they could uploaded on my chart as an attachment perhaps  Email will be lost and not part of the patient record  Thx  TG    Returned call to patient's dental office to advise of above. They are closed on Fridays. Will call back on Monday, 7/17

## 2022-06-18 NOTE — Telephone Encounter (Signed)
Called and spoke with Glenda to advise her of above.   She will relay this to the patient and if patient wants to send via [place] she will send PDF to patient to do so.

## 2022-06-19 ENCOUNTER — Telehealth: Payer: Self-pay | Admitting: Emergency Medicine

## 2022-06-19 NOTE — Telephone Encounter (Signed)
Called and spoke with patient. Results reviewed with patient, pt verbalized understanding,  questions (if any) answered.   ?

## 2022-06-19 NOTE — Telephone Encounter (Signed)
-----   Message from Minna Merritts, MD sent at 06/15/2022  1:38 PM EDT ----- Carotid ultrasound No significant carotid stenosis noted

## 2022-06-27 ENCOUNTER — Telehealth (INDEPENDENT_AMBULATORY_CARE_PROVIDER_SITE_OTHER): Payer: PPO | Admitting: Family Medicine

## 2022-06-27 ENCOUNTER — Other Ambulatory Visit (INDEPENDENT_AMBULATORY_CARE_PROVIDER_SITE_OTHER): Payer: PPO

## 2022-06-27 DIAGNOSIS — E7800 Pure hypercholesterolemia, unspecified: Secondary | ICD-10-CM

## 2022-06-27 DIAGNOSIS — E039 Hypothyroidism, unspecified: Secondary | ICD-10-CM

## 2022-06-27 DIAGNOSIS — R5382 Chronic fatigue, unspecified: Secondary | ICD-10-CM

## 2022-06-27 DIAGNOSIS — M818 Other osteoporosis without current pathological fracture: Secondary | ICD-10-CM

## 2022-06-27 LAB — COMPREHENSIVE METABOLIC PANEL
ALT: 10 U/L (ref 0–35)
AST: 19 U/L (ref 0–37)
Albumin: 4.2 g/dL (ref 3.5–5.2)
Alkaline Phosphatase: 45 U/L (ref 39–117)
BUN: 13 mg/dL (ref 6–23)
CO2: 31 mEq/L (ref 19–32)
Calcium: 8.9 mg/dL (ref 8.4–10.5)
Chloride: 106 mEq/L (ref 96–112)
Creatinine, Ser: 0.8 mg/dL (ref 0.40–1.20)
GFR: 70.23 mL/min (ref >60.00)
Glucose, Bld: 84 mg/dL (ref 70–99)
Potassium: 3.9 mEq/L (ref 3.5–5.1)
Sodium: 141 mEq/L (ref 135–145)
Total Bilirubin: 0.7 mg/dL (ref 0.2–1.2)
Total Protein: 6.3 g/dL (ref 6.0–8.3)

## 2022-06-27 LAB — CBC WITH DIFFERENTIAL/PLATELET
Basophils Absolute: 0 10*3/uL (ref 0.0–0.1)
Basophils Relative: 0.5 % (ref 0.0–3.0)
Eosinophils Absolute: 0.1 10*3/uL (ref 0.0–0.7)
Eosinophils Relative: 1.8 % (ref 0.0–5.0)
HCT: 37.8 % (ref 36.0–46.0)
Hemoglobin: 12.8 g/dL (ref 12.0–15.0)
Lymphocytes Relative: 36.5 % (ref 12.0–46.0)
Lymphs Abs: 2.2 10*3/uL (ref 0.7–4.0)
MCHC: 33.8 g/dL (ref 30.0–36.0)
MCV: 90.6 fl (ref 78.0–100.0)
Monocytes Absolute: 0.6 10*3/uL (ref 0.1–1.0)
Monocytes Relative: 9.4 % (ref 3.0–12.0)
Neutro Abs: 3.2 10*3/uL (ref 1.4–7.7)
Neutrophils Relative %: 51.8 % (ref 43.0–77.0)
Platelets: 155 10*3/uL (ref 150.0–400.0)
RBC: 4.17 Mil/uL (ref 3.87–5.11)
RDW: 14.1 % (ref 11.5–15.5)
WBC: 6.1 10*3/uL (ref 4.0–10.5)

## 2022-06-27 LAB — LIPID PANEL
Cholesterol: 174 mg/dL (ref 0–200)
HDL: 53.1 mg/dL (ref >39.00)
LDL Cholesterol: 97 mg/dL (ref 0–99)
NonHDL: 120.78
Total CHOL/HDL Ratio: 3
Triglycerides: 119 mg/dL (ref 0.0–149.0)
VLDL: 23.8 mg/dL (ref 0.0–40.0)

## 2022-06-27 LAB — TSH: TSH: 2.23 u[IU]/mL (ref 0.35–5.50)

## 2022-06-27 LAB — VITAMIN D 25 HYDROXY (VIT D DEFICIENCY, FRACTURES): VITD: 37.34 ng/mL (ref 30.00–100.00)

## 2022-06-27 NOTE — Telephone Encounter (Signed)
Lab order

## 2022-06-28 ENCOUNTER — Ambulatory Visit: Payer: PPO

## 2022-06-29 DIAGNOSIS — C44712 Basal cell carcinoma of skin of right lower limb, including hip: Secondary | ICD-10-CM | POA: Diagnosis not present

## 2022-06-29 DIAGNOSIS — D485 Neoplasm of uncertain behavior of skin: Secondary | ICD-10-CM | POA: Diagnosis not present

## 2022-06-29 DIAGNOSIS — D2272 Melanocytic nevi of left lower limb, including hip: Secondary | ICD-10-CM | POA: Diagnosis not present

## 2022-06-29 DIAGNOSIS — L821 Other seborrheic keratosis: Secondary | ICD-10-CM | POA: Diagnosis not present

## 2022-06-29 DIAGNOSIS — L814 Other melanin hyperpigmentation: Secondary | ICD-10-CM | POA: Diagnosis not present

## 2022-06-29 DIAGNOSIS — L570 Actinic keratosis: Secondary | ICD-10-CM | POA: Diagnosis not present

## 2022-06-29 DIAGNOSIS — D2262 Melanocytic nevi of left upper limb, including shoulder: Secondary | ICD-10-CM | POA: Diagnosis not present

## 2022-06-29 DIAGNOSIS — D225 Melanocytic nevi of trunk: Secondary | ICD-10-CM | POA: Diagnosis not present

## 2022-06-29 DIAGNOSIS — D2271 Melanocytic nevi of right lower limb, including hip: Secondary | ICD-10-CM | POA: Diagnosis not present

## 2022-06-29 DIAGNOSIS — D2261 Melanocytic nevi of right upper limb, including shoulder: Secondary | ICD-10-CM | POA: Diagnosis not present

## 2022-07-04 ENCOUNTER — Encounter: Payer: Self-pay | Admitting: Family Medicine

## 2022-07-04 ENCOUNTER — Ambulatory Visit (INDEPENDENT_AMBULATORY_CARE_PROVIDER_SITE_OTHER): Payer: PPO | Admitting: Family Medicine

## 2022-07-04 VITALS — BP 108/64 | HR 85 | Temp 97.7°F | Ht 62.6 in | Wt 134.0 lb

## 2022-07-04 DIAGNOSIS — H9201 Otalgia, right ear: Secondary | ICD-10-CM | POA: Diagnosis not present

## 2022-07-04 DIAGNOSIS — R5381 Other malaise: Secondary | ICD-10-CM | POA: Insufficient documentation

## 2022-07-04 DIAGNOSIS — G4486 Cervicogenic headache: Secondary | ICD-10-CM

## 2022-07-04 DIAGNOSIS — F4321 Adjustment disorder with depressed mood: Secondary | ICD-10-CM | POA: Diagnosis not present

## 2022-07-04 DIAGNOSIS — R519 Headache, unspecified: Secondary | ICD-10-CM | POA: Insufficient documentation

## 2022-07-04 LAB — POC COVID19 BINAXNOW: SARS Coronavirus 2 Ag: NEGATIVE

## 2022-07-04 MED ORDER — CEFDINIR 300 MG PO CAPS: 300.0000 mg | ORAL_CAPSULE | Freq: Two times a day (BID) | ORAL | 0 refills | Status: DC

## 2022-07-04 NOTE — Assessment & Plan Note (Signed)
Also sinus pain  Reassuring exam  Will tx with cefdinir for acute sinusitis (her preference of med)  Disc sympt care  ER precautions reviewed  Close f/u if not imp

## 2022-07-04 NOTE — Patient Instructions (Addendum)
Drink lots of fluids and rest  Aleve and /or tylenol may help the headache Please watch your temperature at home  Drink fluids  Let us know if you develop a fever  Also if worse diarrhea or any nausea or vomiting   If any severe symptoms or shortness of breath go to the ER   Take the cefdinir as directed for a possible sinus infection   Update if not starting to improve in a week or if worsening

## 2022-07-04 NOTE — Assessment & Plan Note (Signed)
Lost her elderly mother and having a tough time   Reviewed stressors/ coping techniques/symptoms/ support sources/ tx options and side effects in detail today Takes lexapro  Offered counseling  Some exhaustion and some physical symptoms

## 2022-07-04 NOTE — Assessment & Plan Note (Addendum)
Chills/ runny nose and R sided headache  No documented fever No ST or rash or tick bites  Some exhaustion from grief/ recently lost her mother  covid test today negative   Will tx for sinusitis with follow up

## 2022-07-04 NOTE — Progress Notes (Signed)
Subjective:    Patient ID: Gabrielle Webb, female    DOB: [birth year], 81 y.o.   MRN: [id number]  HPI Pt presents for respiratory symptoms/chills /fatigue and ear pain  Also grief reaction after loosing her mother   Wt Readings from Last 3 Encounters:  07/04/22 134 lb (60.8 kg)  05/16/22 132 lb 8 oz (60.1 kg)  08/16/21 138 lb (62.6 kg)   24.04 kg/m  Got really cold in church on Sunday  Neck was sore on Monday and used a heating pad  Hurt around the ear   Then stomach acted up  Some diarrhea this am (could have been from a hot dog last night)   Some headache  Behind her eyes -dull pain  R ear hurts (this happens with a sinus infection)  No ST Some runny nose - normal for her  No nasal congestion  All clear  No cough  No wheezing or sob   No fever  Has had chills/not sweats  Some aches in her back   No n/v  No tick bites No rashes   More headaches lately- with aura Since she lost her monther    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 Was very close  Takes lexapro for anxiety  Prn xanax   Patient Active Problem List   Diagnosis Date Noted   Malaise 07/04/2022   Right ear pain 07/04/2022   Headache 07/04/2022   Grief reaction 07/04/2022   Encounter for screening mammogram for breast cancer 12/18/2021   Light headed 06/10/2021   Anxious mood as adjustment reaction 06/09/2021   Hematuria 11/02/2020   Breast lump on right side at 8 o'clock position 05/03/2020   History of breast cancer 05/03/2020   Medicare annual wellness visit, subsequent 05/28/2018   Estrogen deficiency 05/28/2018   Fatigue 05/22/2017   Chest discomfort 05/22/2017   S/P TAH-BSO 11/08/2016   Routine general medical examination at a health care facility 05/06/2016   Malignant neoplasm of upper-outer quadrant of right breast in female, estrogen receptor positive ([place]) 03/21/2015   History of tamoxifen therapy 05/18/2014   Encounter for routine gynecological  examination 05/06/2013   Colon cancer screening 05/06/2013   Encounter for Medicare annual wellness exam 04/28/2013   Hx of radiation therapy    Gynecological examination 12/25/2011   Hx of Breast cancer, stage 1, Right, UOQ, Receptor+,Her2- 05/29/2011    Class: Stage 1   Other screening mammogram 03/21/2011   Post-menopausal 03/21/2011   Hypothyroidism 01/23/2008   Pure hypercholesterolemia 01/23/2008   ALLERGIC RHINITIS 01/23/2008   GERD 01/23/2008   DIVERTICULOSIS, COLON 01/23/2008   IBS 01/23/2008   FIBROCYSTIC BREAST DISEASE 01/23/2008   Osteoporosis 10/17/2007   Past Medical History:  Diagnosis Date   Allergy    Anxiety    Breast cancer ([place]) 7/12   Right   Cellulitis    Diverticulosis of colon    GERD (gastroesophageal reflux disease)    Headache    sinus   History of hiatal hernia    Hx of radiation therapy 06/20/11 - 07/11/11   right breast   Hypothyroid    Osteoporosis    Personal history of radiation therapy 2012   Right Breast Cancer   PONV (postoperative nausea and vomiting)    Skin cancer    basal and squamous cell   Vertigo    Past Surgical History:  Procedure Laterality Date   ABDOMINAL HYSTERECTOMY N/A 11/08/2016   Procedure: HYSTERECTOMY TOTAL  ABDOMINAL;  Surgeon: Dian Queen, MD;  Location: [facility];  Service: Gynecology;  Laterality: N/A;   APPENDECTOMY  07/2007   back sugery  1970   BREAST BIOPSY Left    Benign   BREAST EXCISIONAL BIOPSY Left 1983   BREAST EXCISIONAL BIOPSY Left 1983   BREAST LUMPECTOMY Right 05/16/2011   COLONOSCOPY     COLONOSCOPY WITH PROPOFOL N/A 08/27/2018   Procedure: COLONOSCOPY WITH PROPOFOL;  Surgeon: Manya Silvas, MD;  Location: [facility];  Service: Endoscopy;  Laterality: N/A;   COLONOSCOPY WITH PROPOFOL N/A 08/16/2021   Procedure: COLONOSCOPY WITH PROPOFOL;  Surgeon: Robert Bellow, MD;  Location: [facility];  Service: Endoscopy;  Laterality: N/A;   DIAGNOSTIC LAPAROSCOPY     DILATION AND  CURETTAGE OF UTERUS N/A 05/31/2014   Procedure: DILATATION AND CURETTAGE WITH ULTRASOUND GUIDANCE;  Surgeon: Cyril Mourning, MD;  Location: [facility];  Service: Gynecology;  Laterality: N/A;   ESOPHAGOGASTRODUODENOSCOPY  04/20/2003   KNEE SURGERY  1962   MASTECTOMY     right eye surgery     SALPINGOOPHORECTOMY Bilateral 11/08/2016   Procedure: BILATERAL SALPINGO OOPHORECTOMY;  Surgeon: Dian Queen, MD;  Location: [facility];  Service: Gynecology;  Laterality: Bilateral;   UPPER GI ENDOSCOPY     Social History   Tobacco Use   Smoking status: Never   Smokeless tobacco: Never  Vaping Use   Vaping Use: Never used  Substance Use Topics   Alcohol use: No    Alcohol/week: 0.0 standard drinks of alcohol   Drug use: No   Family History  Problem Relation Age of Onset   Atrial fibrillation Mother    Coronary artery disease Mother    Congenital heart disease Mother    Heart attack Father 80   Hypertension Father    Diabetes Maternal Aunt    Cancer Maternal Aunt        leukemia   Diabetes Maternal Uncle    Cancer Maternal Uncle        colon   Diabetes Other        Grandmother   Coronary artery disease Other        Grandmother   Uterine cancer Other        Grandmother   Leukemia Other        Aunt   Allergies  Allergen Reactions   Codeine Nausea And Vomiting   Fentanyl Nausea And Vomiting   Hydromorphone Nausea And Vomiting        Morphine Nausea And Vomiting   Ciprofloxacin     Intolerant- tingling    Tape     Surgical tape   Prednisone Palpitations    Rapid hear beat   Current Outpatient Medications on File Prior to Visit  Medication Sig Dispense Refill   atorvastatin (LIPITOR) 10 MG tablet Take 1 tablet (10 mg total) by mouth daily. 90 tablet 3   BIOTIN PO Take by mouth daily.     CALCIUM PO Take 1,500 mg by mouth daily.     chlorthalidone (HYGROTON) 25 MG tablet TAKE TWO TABLETS EVERY DAY AS NEEDED FORSWELLING 180 tablet 0   Cholecalciferol (VITAMIN D) 2000 UNITS  CAPS Take 1 capsule by mouth daily.     Cyanocobalamin (B-12 PO) Take 2,500 mcg by mouth daily.     denosumab (PROLIA) 60 MG/ML SOSY injection Inject 60 mg into the skin every 6 (six) months. 1 mL 0   diclofenac sodium (VOLTAREN) 1 % GEL Apply 2 g topically 4 (four) times daily. Rub  into affected area of foot 2 to 4 times daily (Patient taking differently: Apply 2 g topically as needed. Rub into affected area of foot 2 to 4 times daily) 100 g 2   dicyclomine (BENTYL) 10 MG capsule TAKE 1 CAPSULE BY MOUTH TWICE DAILY FOR SPASMS 180 capsule 2   escitalopram (LEXAPRO) 5 MG tablet TAKE 1 TABLET BY MOUTH ONCE DAILY 30 tablet 3   famotidine (PEPCID) 20 MG tablet TAKE ONE TABLET EVERY DAY AS NEEDED FOR HEARTBURN OR INDIGESTION 90 tablet 3   fluticasone (FLONASE) 50 MCG/ACT nasal spray SPRAY TWICE INTO EACH NOSTRIL ONCE DAILY 18 g 2   levothyroxine (SYNTHROID) 75 MCG tablet TAKE ONE TABLET BY MOUTH DAILY BEFORE BREAKFAST 90 tablet 0   loratadine (CLARITIN) 10 MG tablet Take 10 mg by mouth daily.     Multiple Vitamin (MULTIVITAMIN) capsule Take 1 capsule by mouth daily.     Naproxen Sodium (ALEVE PO) Take 1 tablet by mouth as needed.     Phenylephrine-APAP-guaiFENesin (TYLENOL SINUS SEVERE PO) Take by mouth.     Omega-3 Fatty Acids (FISH OIL PO) Take 1 capsule by mouth daily. (Patient not taking: Reported on 07/04/2022)     No current facility-administered medications on file prior to visit.       Review of Systems  Constitutional:  Positive for fatigue. Negative for activity change, appetite change, fever and unexpected weight change.  HENT:  Positive for ear pain, postnasal drip, sinus pressure and sinus pain. Negative for congestion, ear discharge, facial swelling, hearing loss, rhinorrhea, sore throat, trouble swallowing and voice change.   Eyes:  Negative for pain, redness and visual disturbance.  Respiratory:  Negative for cough, shortness of breath and wheezing.   Cardiovascular:  Negative for  chest pain, palpitations and leg swelling.  Gastrointestinal:  Negative for abdominal pain, blood in stool, constipation and diarrhea.  Endocrine: Negative for polydipsia and polyuria.  Genitourinary:  Negative for dysuria, frequency and urgency.  Musculoskeletal:  Negative for arthralgias, back pain and myalgias.  Skin:  Negative for pallor and rash.  Allergic/Immunologic: Negative for environmental allergies.  Neurological:  Positive for headaches. Negative for dizziness and syncope.  Hematological:  Negative for adenopathy. Does not bruise/bleed easily.  Psychiatric/Behavioral:  Negative for decreased concentration and dysphoric mood. The patient is not nervous/anxious.        Objective:   Physical Exam Constitutional:      General: She is not in acute distress.    Appearance: Normal appearance. She is normal weight. She is not ill-appearing.  HENT:     Head: Normocephalic and atraumatic.     Comments: Tender over R maxillary sinus and also mastoid area  Temple is not tender      Right Ear: Tympanic membrane, ear canal and external ear normal.     Left Ear: Tympanic membrane, ear canal and external ear normal.     Nose: Rhinorrhea present. No congestion.     Mouth/Throat:     Mouth: Mucous membranes are moist.     Pharynx: Oropharynx is clear. No oropharyngeal exudate or posterior oropharyngeal erythema.  Eyes:     General:        Right eye: No discharge.        Left eye: No discharge.     Conjunctiva/sclera: Conjunctivae normal.     Pupils: Pupils are equal, round, and reactive to light.  Neck:     Vascular: No carotid bruit.  Cardiovascular:     Rate and Rhythm: Normal  rate and regular rhythm.     Heart sounds: Normal heart sounds.  Pulmonary:     Effort: Pulmonary effort is normal. No respiratory distress.     Breath sounds: Normal breath sounds. No stridor. No wheezing, rhonchi or rales.  Chest:     Chest wall: No tenderness.  Musculoskeletal:     Cervical back:  Neck supple. No tenderness.  Lymphadenopathy:     Cervical: No cervical adenopathy.  Skin:    General: Skin is warm and dry.     Findings: No rash.  Neurological:     Mental Status: She is alert.     Cranial Nerves: No cranial nerve deficit.     Motor: No weakness.  Psychiatric:        Attention and Perception: Attention normal.        Mood and Affect: Affect is tearful.        Cognition and Memory: Cognition and memory normal.     Comments: Occ tearful when discussing grief Candidly discusses symptoms and stressors             Assessment & Plan:   Problem List Items Addressed This Visit       Other   Grief reaction    Lost her elderly mother and having a tough time   Reviewed stressors/ coping techniques/symptoms/ support sources/ tx options and side effects in detail today Takes lexapro  Offered counseling  Some exhaustion and some physical symptoms       Headache    Pt has had some migraine type headaches behind her eyes with occ aura (used to have migraines)   Now since Sunday has more pain behind R ear and in R neck  Some chills and runny nose covid test today       Relevant Orders   POC COVID-19 (Completed)   Malaise    Chills/ runny nose and R sided headache  No documented fever No ST or rash or tick bites  Some exhaustion from grief/ recently lost her mother  covid test today negative   Will tx for sinusitis with follow up         Right ear pain - Primary    Also sinus pain  Reassuring exam  Will tx with cefdinir for acute sinusitis (her preference of med)  Disc sympt care  ER precautions reviewed  Close f/u if not imp      Relevant Orders   POC COVID-19 (Completed)

## 2022-07-04 NOTE — Assessment & Plan Note (Signed)
Pt has had some migraine type headaches behind her eyes with occ aura (used to have migraines)   Now since Sunday has more pain behind R ear and in R neck  Some chills and runny nose covid test today

## 2022-07-16 DIAGNOSIS — Z1272 Encounter for screening for malignant neoplasm of vagina: Secondary | ICD-10-CM | POA: Diagnosis not present

## 2022-07-16 DIAGNOSIS — Z6823 Body mass index (BMI) 23.0-23.9, adult: Secondary | ICD-10-CM | POA: Diagnosis not present

## 2022-07-16 DIAGNOSIS — Z853 Personal history of malignant neoplasm of breast: Secondary | ICD-10-CM | POA: Diagnosis not present

## 2022-07-16 DIAGNOSIS — Z124 Encounter for screening for malignant neoplasm of cervix: Secondary | ICD-10-CM | POA: Diagnosis not present

## 2022-07-16 LAB — HM PAP SMEAR

## 2022-07-23 ENCOUNTER — Ambulatory Visit
Admission: RE | Admit: 2022-07-23 | Discharge: 2022-07-23 | Disposition: A | Discharge status: Home / Self Care | Payer: PPO | Source: Ambulatory Visit | Attending: Family Medicine | Admitting: Family Medicine

## 2022-07-23 DIAGNOSIS — Z1231 Encounter for screening mammogram for malignant neoplasm of breast: Secondary | ICD-10-CM

## 2022-07-23 DIAGNOSIS — E2839 Other primary ovarian failure: Secondary | ICD-10-CM

## 2022-07-23 DIAGNOSIS — Z780 Asymptomatic menopausal state: Secondary | ICD-10-CM | POA: Diagnosis not present

## 2022-07-23 DIAGNOSIS — M8588 Other specified disorders of bone density and structure, other site: Secondary | ICD-10-CM | POA: Diagnosis not present

## 2022-07-23 DIAGNOSIS — M810 Age-related osteoporosis without current pathological fracture: Secondary | ICD-10-CM | POA: Diagnosis not present

## 2022-07-24 ENCOUNTER — Telehealth: Payer: Self-pay

## 2022-07-24 NOTE — Telephone Encounter (Signed)
Patient is calling in wanting to know the results of her bone density and mammogram that were done yesterday.

## 2022-07-24 NOTE — Telephone Encounter (Signed)
Called and pt to let her know that we do have the results , however once you have reviewed  her reports I  or someone will call with results, pt was very nervous because of her hx of breast cancer and  Dr Owens Shark is usually there let her know about her results and she wasn't there on yesterday. Pt is very concern. I reassure that we call with results  once you have reviewed this.

## 2022-07-24 NOTE — Telephone Encounter (Signed)
Called and notified pt of results and advised pt that she has an appt come  to discuss treatment

## 2022-07-24 NOTE — Telephone Encounter (Signed)
Bone density is a bit worse in the osteoporosis range Mammogram looked normal She should be able to see both on mychart also   We will review in more detail at her appt tomorrow

## 2022-07-25 ENCOUNTER — Encounter: Payer: Self-pay | Admitting: Family Medicine

## 2022-07-25 ENCOUNTER — Ambulatory Visit (INDEPENDENT_AMBULATORY_CARE_PROVIDER_SITE_OTHER): Payer: PPO | Admitting: Family Medicine

## 2022-07-25 VITALS — BP 106/60 | HR 60 | Ht 63.0 in | Wt 134.6 lb

## 2022-07-25 DIAGNOSIS — Z1211 Encounter for screening for malignant neoplasm of colon: Secondary | ICD-10-CM | POA: Diagnosis not present

## 2022-07-25 DIAGNOSIS — F4322 Adjustment disorder with anxiety: Secondary | ICD-10-CM | POA: Diagnosis not present

## 2022-07-25 DIAGNOSIS — M818 Other osteoporosis without current pathological fracture: Secondary | ICD-10-CM | POA: Diagnosis not present

## 2022-07-25 DIAGNOSIS — E7800 Pure hypercholesterolemia, unspecified: Secondary | ICD-10-CM | POA: Diagnosis not present

## 2022-07-25 DIAGNOSIS — Z0000 Encounter for general adult medical examination without abnormal findings: Secondary | ICD-10-CM | POA: Diagnosis not present

## 2022-07-25 DIAGNOSIS — E039 Hypothyroidism, unspecified: Secondary | ICD-10-CM

## 2022-07-25 MED ORDER — ATORVASTATIN CALCIUM 10 MG PO TABS: 10.0000 mg | ORAL_TABLET | Freq: Every day | ORAL | 3 refills | Status: DC

## 2022-07-25 MED ORDER — FAMOTIDINE 20 MG PO TABS: ORAL_TABLET | ORAL | 3 refills | Status: AC

## 2022-07-25 MED ORDER — LEVOTHYROXINE SODIUM 75 MCG PO TABS
ORAL_TABLET | ORAL | 3 refills | Status: DC
Start: 2022-07-25 — End: 2023-10-29

## 2022-07-25 MED ORDER — ESCITALOPRAM OXALATE 5 MG PO TABS: 5.0000 mg | ORAL_TABLET | Freq: Every day | ORAL | 3 refills | Status: DC

## 2022-07-25 NOTE — Assessment & Plan Note (Signed)
Reviewed health habits including diet and exercise and skin cancer prevention Reviewed appropriate screening tests for age  Also reviewed health mt list, fam hx and immunization status , as well as social and family history   See HPI Labs reviewed  Flu shot planned for fall Mammogram utd dexa utd/no falls or fx Colonoscopy utd Mood is better  utd gyn care (pap was recent-sent for) with hysterectomy

## 2022-07-25 NOTE — Assessment & Plan Note (Signed)
Colonoscopy utd 08/2021 with no recall due to age

## 2022-07-25 NOTE — Progress Notes (Signed)
Subjective:    Patient ID: Gabrielle Webb, female    DOB: 02/09/1941, 81 y.o.   MRN: [id number]  HPI Here for health maintenance exam and to review chronic medical problems    Wt Readings from Last 3 Encounters:  07/25/22 134 lb 9.6 oz (61.1 kg)  07/04/22 134 lb (60.8 kg)  05/16/22 132 lb 8 oz (60.1 kg)   23.84 kg/m  Immunization History  Administered Date(s) Administered   Fluad Quad(high Dose 65+) 08/27/2019   Influenza Split 09/17/2011   Influenza Whole 10/17/2007, 09/27/2008, 09/23/2009   Influenza, High Dose Seasonal PF 09/19/2015, 08/26/2017, 08/23/2020, 09/08/2021   Influenza-Unspecified 09/01/2013, 10/19/2014, 08/24/2016, 10/21/2018   PFIZER(Purple Top)SARS-COV-2 Vaccination 12/29/2019, 01/19/2020, 09/15/2020   Pneumococcal Conjugate-13 05/13/2015   Pneumococcal Polysaccharide-23 12/16/2006, 05/11/2014   Td 12/03/2005   Tdap 12/10/2017   Zoster Recombinat (Shingrix) 05/21/2018, 08/12/2018   Zoster, Live 12/03/2007   Health Maintenance Due  Topic Date Due   COVID-19 Vaccine (4 - Pfizer risk series) 11/10/2020   INFLUENZA VACCINE  07/03/2022    Last visit tx for sinusitis Much better  No more ear pain    Flu shot- planned in the fall   Mammogram 07/2022 Personal h/o breast cancer  Self breast exam: no lumps  Saw gyn for exam   Dexa  07/2022 OP  Sees endocrinology Taking prolia - doing fine /no problems  Tamoxifen in the past  Falls: none  Fractures: none  Supplements : ca and D  Exercise : wants to walk more when it cools off  Aerobics in house when too hot  Has some 2 lb weights   Had gyn visit with Dr Helane Rima  Had pap 07/16/22  Colon cancer screening - colonoscopy 08/2021 with no recall due to age  Mood : better    07/25/2022    2:06 PM 07/04/2022    8:48 AM 07/03/2021   10:41 AM 06/09/2021   10:17 AM 06/22/2020    1:22 PM  Depression screen PHQ 2/9  Decreased Interest 0 0 0  0  Down, Depressed, Hopeless 0 0 0  0  PHQ - 2 Score 0 0 0  0   Altered sleeping    3 0  Tired, decreased energy    3 0  Change in appetite     0  Feeling bad or failure about yourself     0 0  Trouble concentrating    0 0  Moving slowly or fidgety/restless     0  Suicidal thoughts    0 0  PHQ-9 Score     0  Difficult doing work/chores    Not difficult at all Not difficult at all    Lexapro 5 mg daily for anxiety Is really helping   BP Readings from Last 3 Encounters:  07/04/22 108/64  05/16/22 (!) 100/58  08/16/21 (!) 141/68   Pulse Readings from Last 3 Encounters:  07/04/22 85  05/16/22 (!) 106  08/16/21 72     Hypothyroidism  Sees endocrinology  Pt has no clinical changes No change in energy level/ hair or skin/ edema and no tremor Lab Results  Component Value Date   TSH 2.23 06/27/2022    Levothyroxine 75 mcg daily   Hyperlipidemia Lab Results  Component Value Date   CHOL 174 06/27/2022   CHOL 148 05/23/2021   CHOL 175 06/13/2020   Lab Results  Component Value Date   HDL 53.10 06/27/2022   HDL 59.90 05/23/2021   HDL 59.90 06/13/2020  Lab Results  Component Value Date   LDLCALC 97 06/27/2022   LDLCALC 71 05/23/2021   LDLCALC 94 06/13/2020   Lab Results  Component Value Date   TRIG 119.0 06/27/2022   TRIG 87.0 05/23/2021   TRIG 105.0 06/13/2020   Lab Results  Component Value Date   CHOLHDL 3 06/27/2022   CHOLHDL 2 05/23/2021   CHOLHDL 3 06/13/2020   Lab Results  Component Value Date   LDLDIRECT 142.0 03/19/2011   LDLDIRECT 125.4 12/08/2009   LDLDIRECT 172.0 09/19/2009   Atorvastatin 10 mg daily  Father had MI at 24  LDL in 90s  Up just a little  Eats too many carbs  No sausage or bacon as a rule  Occ 1 pc of bacon-rarely  Eats cheerios   Chicken for protein    Other labs Lab Results  Component Value Date   CREATININE 0.80 06/27/2022   BUN 13 06/27/2022   NA 141 06/27/2022   K 3.9 06/27/2022   CL 106 06/27/2022   CO2 31 06/27/2022   Lab Results  Component Value Date   ALT 10  06/27/2022   AST 19 06/27/2022   ALKPHOS 45 06/27/2022   BILITOT 0.7 06/27/2022   Lab Results  Component Value Date   WBC 6.1 06/27/2022   HGB 12.8 06/27/2022   HCT 37.8 06/27/2022   MCV 90.6 06/27/2022   PLT 155.0 06/27/2022   Mvi B12   Patient Active Problem List   Diagnosis Date Noted   Grief reaction 07/04/2022   Encounter for screening mammogram for breast cancer 12/18/2021   Anxious mood as adjustment reaction 06/09/2021   Breast lump on right side at 8 o'clock position 05/03/2020   History of breast cancer 05/03/2020   Medicare annual wellness visit, subsequent 05/28/2018   Estrogen deficiency 05/28/2018   Fatigue 05/22/2017   Chest discomfort 05/22/2017   S/P TAH-BSO 11/08/2016   Routine general medical examination at a health care facility 05/06/2016   Malignant neoplasm of upper-outer quadrant of right breast in female, estrogen receptor positive ([place]) 03/21/2015   History of tamoxifen therapy 05/18/2014   Encounter for routine gynecological examination 05/06/2013   Colon cancer screening 05/06/2013   Encounter for Medicare annual wellness exam 04/28/2013   Hx of radiation therapy    Hx of Breast cancer, stage 1, Right, UOQ, Receptor+,Her2- 05/29/2011    Class: Stage 1   Other screening mammogram 03/21/2011   Post-menopausal 03/21/2011   Hypothyroidism 01/23/2008   Pure hypercholesterolemia 01/23/2008   ALLERGIC RHINITIS 01/23/2008   GERD 01/23/2008   DIVERTICULOSIS, COLON 01/23/2008   IBS 01/23/2008   FIBROCYSTIC BREAST DISEASE 01/23/2008   Osteoporosis 10/17/2007   Past Medical History:  Diagnosis Date   Allergy    Anxiety    Breast cancer ([place]) 7/12   Right   Cellulitis    Diverticulosis of colon    GERD (gastroesophageal reflux disease)    Headache    sinus   History of hiatal hernia    Hx of radiation therapy 06/20/11 - 07/11/11   right breast   Hypothyroid    Osteoporosis    Personal history of radiation therapy 2012   Right Breast Cancer    PONV (postoperative nausea and vomiting)    Skin cancer    basal and squamous cell   Vertigo    Past Surgical History:  Procedure Laterality Date   ABDOMINAL HYSTERECTOMY N/A 11/08/2016   Procedure: HYSTERECTOMY TOTAL  ABDOMINAL;  Surgeon: Dian Queen, MD;  Location: [facility]  ORS;  Service: Gynecology;  Laterality: N/A;   APPENDECTOMY  07/2007   back sugery  1970   BREAST BIOPSY Left    Benign   BREAST EXCISIONAL BIOPSY Left 1983   BREAST EXCISIONAL BIOPSY Left 1983   BREAST LUMPECTOMY Right 05/16/2011   COLONOSCOPY     COLONOSCOPY WITH PROPOFOL N/A 08/27/2018   Procedure: COLONOSCOPY WITH PROPOFOL;  Surgeon: Manya Silvas, MD;  Location: [facility] ENDOSCOPY;  Service: Endoscopy;  Laterality: N/A;   COLONOSCOPY WITH PROPOFOL N/A 08/16/2021   Procedure: COLONOSCOPY WITH PROPOFOL;  Surgeon: Robert Bellow, MD;  Location: [facility] ENDOSCOPY;  Service: Endoscopy;  Laterality: N/A;   DIAGNOSTIC LAPAROSCOPY     DILATION AND CURETTAGE OF UTERUS N/A 05/31/2014   Procedure: DILATATION AND CURETTAGE WITH ULTRASOUND GUIDANCE;  Surgeon: Cyril Mourning, MD;  Location: [place] ORS;  Service: Gynecology;  Laterality: N/A;   ESOPHAGOGASTRODUODENOSCOPY  04/20/2003   KNEE SURGERY  1962   MASTECTOMY     right eye surgery     SALPINGOOPHORECTOMY Bilateral 11/08/2016   Procedure: BILATERAL SALPINGO OOPHORECTOMY;  Surgeon: Dian Queen, MD;  Location: [place] ORS;  Service: Gynecology;  Laterality: Bilateral;   UPPER GI ENDOSCOPY     Social History   Tobacco Use   Smoking status: Never   Smokeless tobacco: Never  Vaping Use   Vaping Use: Never used  Substance Use Topics   Alcohol use: No    Alcohol/week: 0.0 standard drinks of alcohol   Drug use: No   Family History  Problem Relation Age of Onset   Atrial fibrillation Mother    Coronary artery disease Mother    Congenital heart disease Mother    Heart attack Father 35   Hypertension Father    Diabetes Maternal Aunt    Cancer Maternal Aunt         leukemia   Diabetes Maternal Uncle    Cancer Maternal Uncle        colon   Diabetes Other        Grandmother   Coronary artery disease Other        Grandmother   Uterine cancer Other        Grandmother   Leukemia Other        Aunt   Allergies  Allergen Reactions   Codeine Nausea And Vomiting   Fentanyl Nausea And Vomiting   Hydromorphone Nausea And Vomiting        Morphine Nausea And Vomiting   Ciprofloxacin     Intolerant- tingling    Tape     Surgical tape   Prednisone Palpitations    Rapid hear beat   Current Outpatient Medications on File Prior to Visit  Medication Sig Dispense Refill   BIOTIN PO Take by mouth daily.     CALCIUM PO Take 1,500 mg by mouth daily.     chlorthalidone (HYGROTON) 25 MG tablet TAKE TWO TABLETS EVERY DAY AS NEEDED FORSWELLING 180 tablet 0   Cholecalciferol (VITAMIN D) 2000 UNITS CAPS Take 1 capsule by mouth daily.     Cyanocobalamin (B-12 PO) Take 2,500 mcg by mouth daily.     denosumab (PROLIA) 60 MG/ML SOSY injection Inject 60 mg into the skin every 6 (six) months. 1 mL 0   diclofenac sodium (VOLTAREN) 1 % GEL Apply 2 g topically 4 (four) times daily. Rub into affected area of foot 2 to 4 times daily (Patient taking differently: Apply 2 g topically as needed. Rub into affected area of foot 2 to  4 times daily) 100 g 2   dicyclomine (BENTYL) 10 MG capsule TAKE 1 CAPSULE BY MOUTH TWICE DAILY FOR SPASMS 180 capsule 2   fluticasone (FLONASE) 50 MCG/ACT nasal spray SPRAY TWICE INTO EACH NOSTRIL ONCE DAILY 18 g 2   loratadine (CLARITIN) 10 MG tablet Take 10 mg by mouth daily.     Multiple Vitamin (MULTIVITAMIN) capsule Take 1 capsule by mouth daily.     Naproxen Sodium (ALEVE PO) Take 1 tablet by mouth as needed.     Omega-3 Fatty Acids (FISH OIL PO) Take 1 capsule by mouth daily. (Patient not taking: Reported on 07/04/2022)     Phenylephrine-APAP-guaiFENesin (TYLENOL SINUS SEVERE PO) Take by mouth.     No current facility-administered  medications on file prior to visit.    Review of Systems  Constitutional:  Negative for activity change, appetite change, fatigue, fever and unexpected weight change.  HENT:  Negative for congestion, ear pain, rhinorrhea, sinus pressure and sore throat.   Eyes:  Negative for pain, redness and visual disturbance.  Respiratory:  Negative for cough, shortness of breath and wheezing.   Cardiovascular:  Negative for chest pain and palpitations.  Gastrointestinal:  Negative for abdominal pain, blood in stool, constipation and diarrhea.  Endocrine: Negative for polydipsia and polyuria.  Genitourinary:  Negative for dysuria, frequency and urgency.  Musculoskeletal:  Negative for arthralgias, back pain and myalgias.  Skin:  Negative for pallor and rash.  Allergic/Immunologic: Negative for environmental allergies.  Neurological:  Negative for dizziness, syncope and headaches.  Hematological:  Negative for adenopathy. Does not bruise/bleed easily.  Psychiatric/Behavioral:  Negative for decreased concentration and dysphoric mood. The patient is nervous/anxious.        Some grief        Objective:   Physical Exam Constitutional:      General: She is not in acute distress.    Appearance: Normal appearance. She is well-developed and normal weight. She is not ill-appearing or diaphoretic.  HENT:     Head: Normocephalic and atraumatic.     Right Ear: Tympanic membrane, ear canal and external ear normal.     Left Ear: Tympanic membrane, ear canal and external ear normal.     Nose: Nose normal. No congestion.     Mouth/Throat:     Mouth: Mucous membranes are moist.     Pharynx: Oropharynx is clear. No posterior oropharyngeal erythema.  Eyes:     General: No scleral icterus.    Extraocular Movements: Extraocular movements intact.     Conjunctiva/sclera: Conjunctivae normal.     Pupils: Pupils are equal, round, and reactive to light.  Neck:     Thyroid: No thyromegaly.     Vascular: No carotid  bruit or JVD.  Cardiovascular:     Rate and Rhythm: Normal rate and regular rhythm.     Pulses: Normal pulses.     Heart sounds: Normal heart sounds.     No gallop.  Pulmonary:     Effort: Pulmonary effort is normal. No respiratory distress.     Breath sounds: Normal breath sounds. No wheezing.     Comments: Good air exch Chest:     Chest wall: No tenderness.  Abdominal:     General: Bowel sounds are normal. There is no distension or abdominal bruit.     Palpations: Abdomen is soft. There is no mass.     Tenderness: There is no abdominal tenderness.     Hernia: No hernia is present.  Genitourinary:  Comments: Breast and pelvic exam done by gyn  Musculoskeletal:        General: No tenderness. Normal range of motion.     Cervical back: Normal range of motion and neck supple. No rigidity. No muscular tenderness.     Right lower leg: No edema.     Left lower leg: No edema.     Comments: No kyphosis   Lymphadenopathy:     Cervical: No cervical adenopathy.  Skin:    General: Skin is warm and dry.     Coloration: Skin is not pale.     Findings: No erythema or rash.     Comments: Solar lentigines diffusely Some sks   Neurological:     Mental Status: She is alert. Mental status is at baseline.     Cranial Nerves: No cranial nerve deficit.     Motor: No abnormal muscle tone.     Coordination: Coordination normal.     Gait: Gait normal.     Deep Tendon Reflexes: Reflexes are normal and symmetric. Reflexes normal.  Psychiatric:        Mood and Affect: Mood normal.        Cognition and Memory: Cognition and memory normal.           Assessment & Plan:   Problem List Items Addressed This Visit       Endocrine   Hypothyroidism    Hypothyroidism  Pt has no clinical changes No change in energy level/ hair or skin/ edema and no tremor Lab Results  Component Value Date   TSH 2.23 06/27/2022    Feels better overall  Levothyroxine 75 mcg daily  Also sees endocrinology       Relevant Medications   levothyroxine (SYNTHROID) 75 MCG tablet     Musculoskeletal and Integument   Osteoporosis    Rev dexa 07/2022  OP- some decrease  Tolerates prolia Taking ca and D No falls or fx Enc regular wt bearing exercise   Also sees endocrinology  Nl ca level and renal fxn          Other   Anxious mood as adjustment reaction    Doing better with anx and grief Plan to continue lexapro 5 mg daily         Colon cancer screening    Colonoscopy utd 08/2021 with no recall due to age       Pure hypercholesterolemia    Disc goals for lipids and reasons to control them Rev last labs with pt Rev low sat fat diet in detail Plan to continue atorvastatin 10 mg daily  fam h/o CAD        Relevant Medications   atorvastatin (LIPITOR) 10 MG tablet   Routine general medical examination at a health care facility - Primary    Reviewed health habits including diet and exercise and skin cancer prevention Reviewed appropriate screening tests for age  Also reviewed health mt list, fam hx and immunization status , as well as social and family history   See HPI Labs reviewed  Flu shot planned for fall Mammogram utd dexa utd/no falls or fx Colonoscopy utd Mood is better  utd gyn care (pap was recent-sent for) with hysterectomy

## 2022-07-25 NOTE — Assessment & Plan Note (Signed)
Rev dexa 07/2022  OP- some decrease  Tolerates prolia Taking ca and D No falls or fx Enc regular wt bearing exercise   Also sees endocrinology  Nl ca level and renal fxn

## 2022-07-25 NOTE — Assessment & Plan Note (Signed)
Disc goals for lipids and reasons to control them Rev last labs with pt Rev low sat fat diet in detail Plan to continue atorvastatin 10 mg daily  fam h/o CAD

## 2022-07-25 NOTE — Assessment & Plan Note (Signed)
Hypothyroidism  Pt has no clinical changes No change in energy level/ hair or skin/ edema and no tremor Lab Results  Component Value Date   TSH 2.23 06/27/2022    Feels better overall  Levothyroxine 75 mcg daily  Also sees endocrinology

## 2022-07-25 NOTE — Patient Instructions (Addendum)
Get your flu shot in the fall (covid shot if applicable)   Keep exercising indoors or outdoors   For cholesterol  Avoid red meat/ fried foods/ egg yolks/ fatty breakfast meats/ butter, cheese and high fat dairy/ and shellfish    Make sure you get at least 2000 iu of vitamin D daily   Take care of yourself   Stay active   Get outdoors when it cools down

## 2022-07-25 NOTE — Assessment & Plan Note (Signed)
Doing better with anx and grief Plan to continue lexapro 5 mg daily

## 2022-07-27 DIAGNOSIS — C44712 Basal cell carcinoma of skin of right lower limb, including hip: Secondary | ICD-10-CM | POA: Diagnosis not present

## 2022-07-27 DIAGNOSIS — L570 Actinic keratosis: Secondary | ICD-10-CM | POA: Diagnosis not present

## 2022-08-01 DIAGNOSIS — H00014 Hordeolum externum left upper eyelid: Secondary | ICD-10-CM | POA: Diagnosis not present

## 2022-08-15 ENCOUNTER — Other Ambulatory Visit: Payer: Self-pay | Admitting: Family Medicine

## 2022-09-05 DIAGNOSIS — H00014 Hordeolum externum left upper eyelid: Secondary | ICD-10-CM | POA: Diagnosis not present

## 2022-09-20 ENCOUNTER — Ambulatory Visit (INDEPENDENT_AMBULATORY_CARE_PROVIDER_SITE_OTHER): Payer: PPO

## 2022-09-20 DIAGNOSIS — Z0000 Encounter for general adult medical examination without abnormal findings: Secondary | ICD-10-CM | POA: Diagnosis not present

## 2022-09-20 NOTE — Patient Instructions (Signed)
Gabrielle Webb , Thank you for taking time to come for your Medicare Wellness Visit. I appreciate your ongoing commitment to your health goals. Please review the following plan we discussed and let me know if I can assist you in the future.   These are the goals we discussed:  Goals      Increase physical activity     Starting 05/20/2017, I will continue to exercise 15 min every other day.      Patient Stated     Starting 05/26/2018, I will continue to take medications as prescribed.      Patient Stated     06/22/2020, I will continue to walk everyday for 1 hour.      Patient Stated     Maintain health and activity      Prevent Falls and Broken Bones-Osteoporosis     Timeframe:  Long-Range Goal Priority:  Medium Start Date:     01/08/22                        Expected End Date:     01/08/23                  Follow Up Date Feb 2024   - always use handrails on the stairs - always wear shoes or slippers with non-slip sole - get at least 10 minutes of activity every day - keep a flashlight by the bed - keep cell phone with me always - pick up clutter from the floors - use a nightlight in the bathroom    Why is this important?   When you fall, there are 3 things that control if a bone breaks or not.  These are the fall itself, how hard and the direction that you fall and how fragile your bones are.  Preventing falls is very important for you because of fragile bones.     Notes:         This is a list of the screening recommended for you and due dates:  Health Maintenance  Topic Date Due   COVID-19 Vaccine (4 - Pfizer risk series) 11/10/2020   Flu Shot  07/03/2022   Hepatitis C Screening: USPSTF Recommendation to screen - Ages 18-79 yo.  06/20/2030*   Mammogram  01/23/2023   Tetanus Vaccine  12/11/2027   Pneumonia Vaccine  Completed   DEXA scan (bone density measurement)  Completed   Zoster (Shingles) Vaccine  Completed   HPV Vaccine  Aged Out   Pap Smear  Discontinued    Colon Cancer Screening  Discontinued  *Topic was postponed. The date shown is not the original due date.    Advanced directives: copies in chart   Conditions/risks identified: maintain health and activity   Next appointment: Follow up in one year for your annual wellness visit    Preventive Care 65 Years and Older, Female Preventive care refers to lifestyle choices and visits with your health care provider that can promote health and wellness. What does preventive care include? A yearly physical exam. This is also called an annual well check. Dental exams once or twice a year. Routine eye exams. Ask your health care provider how often you should have your eyes checked. Personal lifestyle choices, including: Daily care of your teeth and gums. Regular physical activity. Eating a healthy diet. Avoiding tobacco and drug use. Limiting alcohol use. Practicing safe sex. Taking low-dose aspirin every day. Taking vitamin and mineral supplements as recommended by your  health care provider. What happens during an annual well check? The services and screenings done by your health care provider during your annual well check will depend on your age, overall health, lifestyle risk factors, and family history of disease. Counseling  Your health care provider may ask you questions about your: Alcohol use. Tobacco use. Drug use. Emotional well-being. Home and relationship well-being. Sexual activity. Eating habits. History of falls. Memory and ability to understand (cognition). Work and work Statistician. Reproductive health. Screening  You may have the following tests or measurements: Height, weight, and BMI. Blood pressure. Lipid and cholesterol levels. These may be checked every 5 years, or more frequently if you are over 68 years old. Skin check. Lung cancer screening. You may have this screening every year starting at age 9 if you have a 30-pack-year history of smoking and currently  smoke or have quit within the past 15 years. Fecal occult blood test (FOBT) of the stool. You may have this test every year starting at age 61. Flexible sigmoidoscopy or colonoscopy. You may have a sigmoidoscopy every 5 years or a colonoscopy every 10 years starting at age 69. Hepatitis C blood test. Hepatitis B blood test. Sexually transmitted disease (STD) testing. Diabetes screening. This is done by checking your blood sugar (glucose) after you have not eaten for a while (fasting). You may have this done every 1-3 years. Bone density scan. This is done to screen for osteoporosis. You may have this done starting at age 80. Mammogram. This may be done every 1-2 years. Talk to your health care provider about how often you should have regular mammograms. Talk with your health care provider about your test results, treatment options, and if necessary, the need for more tests. Vaccines  Your health care provider may recommend certain vaccines, such as: Influenza vaccine. This is recommended every year. Tetanus, diphtheria, and acellular pertussis (Tdap, Td) vaccine. You may need a Td booster every 10 years. Zoster vaccine. You may need this after age 78. Pneumococcal 13-valent conjugate (PCV13) vaccine. One dose is recommended after age 70. Pneumococcal polysaccharide (PPSV23) vaccine. One dose is recommended after age 20. Talk to your health care provider about which screenings and vaccines you need and how often you need them. This information is not intended to replace advice given to you by your health care provider. Make sure you discuss any questions you have with your health care provider. Document Released: 12/16/2015 Document Revised: 08/08/2016 Document Reviewed: 09/20/2015 Elsevier Interactive Patient Education  2017 [place] Prevention in the Home Falls can cause injuries. They can happen to people of all ages. There are many things you can do to make your home safe and to  help prevent falls. What can I do on the outside of my home? Regularly fix the edges of walkways and driveways and fix any cracks. Remove anything that might make you trip as you walk through a door, such as a raised step or threshold. Trim any bushes or trees on the path to your home. Use bright outdoor lighting. Clear any walking paths of anything that might make someone trip, such as rocks or tools. Regularly check to see if handrails are loose or broken. Make sure that both sides of any steps have handrails. Any raised decks and porches should have guardrails on the edges. Have any leaves, snow, or ice cleared regularly. Use sand or salt on walking paths during winter. Clean up any spills in your garage right away. This includes  oil or grease spills. What can I do in the bathroom? Use night lights. Install grab bars by the toilet and in the tub and shower. Do not use towel bars as grab bars. Use non-skid mats or decals in the tub or shower. If you need to sit down in the shower, use a plastic, non-slip stool. Keep the floor dry. Clean up any water that spills on the floor as soon as it happens. Remove soap buildup in the tub or shower regularly. Attach bath mats securely with double-sided non-slip rug tape. Do not have throw rugs and other things on the floor that can make you trip. What can I do in the bedroom? Use night lights. Make sure that you have a light by your bed that is easy to reach. Do not use any sheets or blankets that are too big for your bed. They should not hang down onto the floor. Have a firm chair that has side arms. You can use this for support while you get dressed. Do not have throw rugs and other things on the floor that can make you trip. What can I do in the kitchen? Clean up any spills right away. Avoid walking on wet floors. Keep items that you use a lot in easy-to-reach places. If you need to reach something above you, use a strong step stool that has  a grab bar. Keep electrical cords out of the way. Do not use floor polish or wax that makes floors slippery. If you must use wax, use non-skid floor wax. Do not have throw rugs and other things on the floor that can make you trip. What can I do with my stairs? Do not leave any items on the stairs. Make sure that there are handrails on both sides of the stairs and use them. Fix handrails that are broken or loose. Make sure that handrails are as long as the stairways. Check any carpeting to make sure that it is firmly attached to the stairs. Fix any carpet that is loose or worn. Avoid having throw rugs at the top or bottom of the stairs. If you do have throw rugs, attach them to the floor with carpet tape. Make sure that you have a light switch at the top of the stairs and the bottom of the stairs. If you do not have them, ask someone to add them for you. What else can I do to help prevent falls? Wear shoes that: Do not have high heels. Have rubber bottoms. Are comfortable and fit you well. Are closed at the toe. Do not wear sandals. If you use a stepladder: Make sure that it is fully opened. Do not climb a closed stepladder. Make sure that both sides of the stepladder are locked into place. Ask someone to hold it for you, if possible. Clearly mark and make sure that you can see: Any grab bars or handrails. First and last steps. Where the edge of each step is. Use tools that help you move around (mobility aids) if they are needed. These include: Canes. Walkers. Scooters. Crutches. Turn on the lights when you go into a dark area. Replace any light bulbs as soon as they burn out. Set up your furniture so you have a clear path. Avoid moving your furniture around. If any of your floors are uneven, fix them. If there are any pets around you, be aware of where they are. Review your medicines with your doctor. Some medicines can make you feel dizzy. This can  increase your chance of  falling. Ask your doctor what other things that you can do to help prevent falls. This information is not intended to replace advice given to you by your health care provider. Make sure you discuss any questions you have with your health care provider. Document Released: 09/15/2009 Document Revised: 04/26/2016 Document Reviewed: 12/24/2014 Elsevier Interactive Patient Education  2017 [employer].

## 2022-09-20 NOTE — Progress Notes (Signed)
I connected with  Gabrielle Webb on 09/20/22 by a audio enabled telemedicine application and verified that I am speaking with the correct person using two identifiers.  Patient Location: Home  Provider Location: Office/Clinic  I discussed the limitations of evaluation and management by telemedicine. The patient expressed understanding and agreed to proceed.   Subjective:   Gabrielle Webb is a 81 y.o. female who presents for Medicare Annual (Subsequent) preventive examination.  Review of Systems     Cardiac Risk Factors include: advanced age (>103mn, >>14women)     Objective:    There were no vitals filed for this visit. There is no height or weight on file to calculate BMI.     09/20/2022    8:42 AM 08/16/2021    8:12 AM 06/22/2020    1:21 PM 08/27/2018    7:36 AM 05/26/2018    9:20 AM 05/20/2017    9:34 AM 11/08/2016    1:00 PM  Advanced Directives  Does Patient Have a Medical Advance Directive? Yes Yes Yes Yes Yes Yes Yes  Type of AParamedicof ATwin LakesLiving will HSociety HillLiving will HBeat[place]Living will [place]ellvilleLiving will HBishopLiving will HM[place]Living will   Does patient want to make changes to medical advance directive? No - Patient declined      No - Patient declined  Copy of [place] Chart? Yes - validated most recent copy scanned in chart (See row information)  Yes - validated most recent copy scanned in chart (See row information) No - copy requested Yes No - copy requested   Would patient like information on creating a medical advance directive?       No - Patient declined    Current Medications (verified) Outpatient Encounter Medications as of 09/20/2022  Medication Sig   atorvastatin (LIPITOR) 10 MG tablet Take 1 tablet (10 mg total) by mouth daily.   BIOTIN PO Take by mouth daily.   CALCIUM PO Take 1,500  mg by mouth daily.   carboxymethylcellulose (REFRESH PLUS) 0.5 % SOLN 1 drop 3 (three) times daily as needed.   chlorthalidone (HYGROTON) 25 MG tablet TAKE TWO TABLETS EVERY DAY AS NEEDED FORSWELLING   Cholecalciferol (VITAMIN D) 2000 UNITS CAPS Take 1 capsule by mouth daily.   Cyanocobalamin (B-12 PO) Take 2,500 mcg by mouth daily.   denosumab (PROLIA) 60 MG/ML SOSY injection Inject 60 mg into the skin every 6 (six) months.   diclofenac sodium (VOLTAREN) 1 % GEL Apply 2 g topically 4 (four) times daily. Rub into affected area of foot 2 to 4 times daily (Patient taking differently: Apply 2 g topically as needed. Rub into affected area of foot 2 to 4 times daily)   dicyclomine (BENTYL) 10 MG capsule TAKE 1 CAPSULE BY MOUTH TWICE DAILY FOR SPASMS   escitalopram (LEXAPRO) 5 MG tablet Take 1 tablet (5 mg total) by mouth daily.   famotidine (PEPCID) 20 MG tablet TAKE ONE TABLET EVERY DAY AS NEEDED FOR HEARTBURN OR INDIGESTION   Flaxseed, Linseed, (FLAX SEED OIL PO) Take by mouth. 2 a day   fluticasone (FLONASE) 50 MCG/ACT nasal spray SPRAY TWICE INTO EACH NOSTRIL ONCE DAILY   levothyroxine (SYNTHROID) 75 MCG tablet TAKE ONE TABLET BY MOUTH DAILY BEFORE BREAKFAST   Multiple Vitamin (MULTIVITAMIN) capsule Take 1 capsule by mouth daily.   Naproxen Sodium (ALEVE PO) Take 1 tablet by mouth as needed.  Phenylephrine-APAP-guaiFENesin (TYLENOL SINUS SEVERE PO) Take by mouth.   Propylene Glycol (SYSTANE COMPLETE OP) Apply to eye.   [DISCONTINUED] loratadine (CLARITIN) 10 MG tablet Take 10 mg by mouth daily.   [DISCONTINUED] Omega-3 Fatty Acids (FISH OIL PO) Take 1 capsule by mouth daily. (Patient not taking: Reported on 07/04/2022)   No facility-administered encounter medications on file as of 09/20/2022.    Allergies (verified) Codeine, Fentanyl, Hydromorphone, Morphine, Ciprofloxacin, Tape, and Prednisone   History: Past Medical History:  Diagnosis Date   Allergy    Anxiety    Breast cancer ([place])  7/12   Right   Cellulitis    Diverticulosis of colon    GERD (gastroesophageal reflux disease)    Headache    sinus   History of hiatal hernia    Hx of radiation therapy 06/20/11 - 07/11/11   right breast   Hypothyroid    Osteoporosis    Personal history of radiation therapy 2012   Right Breast Cancer   PONV (postoperative nausea and vomiting)    Skin cancer    basal and squamous cell   Vertigo    Past Surgical History:  Procedure Laterality Date   ABDOMINAL HYSTERECTOMY N/A 11/08/2016   Procedure: HYSTERECTOMY TOTAL  ABDOMINAL;  Surgeon: Dian Queen, MD;  Location: [facility];  Service: Gynecology;  Laterality: N/A;   APPENDECTOMY  07/2007   back sugery  1970   BREAST BIOPSY Left    Benign   BREAST EXCISIONAL BIOPSY Left 1983   BREAST EXCISIONAL BIOPSY Left 1983   BREAST LUMPECTOMY Right 05/16/2011   COLONOSCOPY     COLONOSCOPY WITH PROPOFOL N/A 08/27/2018   Procedure: COLONOSCOPY WITH PROPOFOL;  Surgeon: Manya Silvas, MD;  Location: [facility] END[facility]COPY;  Service: Endoscopy;  Laterality: N/A;   COLONOSCOPY WITH PROPOFOL N/A 08/16/2021   Procedure: COLONOSCOPY WITH PROPOFOL;  Surgeon: Robert Bellow, MD;  Location: [facility] ENDOSCOPY;  Service: Endoscopy;  Laterality: N/A;   DIAGNOSTIC LAPAROSCOPY     DILATION AND CURETTAGE OF UTERUS N/A 05/31/2014   Procedure: DILATATION AND CURETTAGE WITH ULTRASOUND GUIDANCE;  Surgeon: Cyril Mourning, MD;  Location: [facility];  Service: Gynecology;  Laterality: N/A;   ESOPHAGOGASTRODUODENOSCOPY  04/20/2003   KNEE SURGERY  1962   MASTECTOMY     right eye surgery     SALPINGOOPHORECTOMY Bilateral 11/08/2016   Procedure: BILATERAL SALPINGO OOPHORECTOMY;  Surgeon: Dian Queen, MD;  Location: [facility];  Service: Gynecology;  Laterality: Bilateral;   UPPER GI ENDOSCOPY     Family History  Problem Relation Age of Onset   Atrial fibrillation Mother    Coronary artery disease Mother    Congenital heart disease Mother    Heart attack Father  48   Hypertension Father    Diabetes Maternal Aunt    Cancer Maternal Aunt        leukemia   Diabetes Maternal Uncle    Cancer Maternal Uncle        colon   Diabetes Other        Grandmother   Coronary artery disease Other        Grandmother   Uterine cancer Other        Grandmother   Leukemia Other        Aunt   Social History   Socioeconomic History   Marital status: Widowed    Spouse name: Not on file   Number of children: 1   Years of education: Not on file   Highest education level: Not  on file  Occupational History   Occupation: Human resources officer  Tobacco Use   Smoking status: Never   Smokeless tobacco: Never  Vaping Use   Vaping Use: Never used  Substance and Sexual Activity   Alcohol use: No    Alcohol/week: 0.0 standard drinks of alcohol   Drug use: No   Sexual activity: Not Currently    Birth control/protection: Post-menopausal  Other Topics Concern   Not on file  Social History Narrative   Exercises on [employer] elliptical   Social Determinants of Health   Financial Resource Strain: Low Risk  (09/20/2022)   Overall Financial Resource Strain (CARDIA)    Difficulty of Paying Living Expenses: Not hard at all  Food Insecurity: No Food Insecurity (09/20/2022)   Hunger Vital Sign    Worried About Running Out of Food in the Last Year: Never true    Ran Out of Food in the Last Year: Never true  Transportation Needs: No Transportation Needs (09/20/2022)   PRAPARE - Hydrologist (Medical): No    Lack of Transportation (Non-Medical): No  Physical Activity: Sufficiently Active (09/20/2022)   Exercise Vital Sign    Days of Exercise per Week: 7 days    Minutes of Exercise per Session: 60 min  Stress: No Stress Concern Present (09/20/2022)   [place]    Feeling of Stress : Not at all  Social Connections: Moderately Integrated (09/20/2022)   Social Connection and  Isolation Panel [NHANES]    Frequency of Communication with Friends and Family: More than three times a week    Frequency of Social Gatherings with Friends and Family: More than three times a week    Attends Religious Services: More than 4 times per year    Active Member of [employer] or Organizations: Yes    Attends Archivist Meetings: 1 to 4 times per year    Marital Status: Widowed    Tobacco Counseling Counseling given: Not Answered   Clinical Intake:  Pre-visit preparation completed: Yes  Pain : No/denies pain     Nutritional Risks: None Diabetes: No  How often do you need to have someone help you when you read instructions, pamphlets, or other written materials from your doctor or pharmacy?: 1 - Never  Diabetic?no  Interpreter Needed?: No  Information entered by :: Charlott Rakes, LPN   Activities of Daily Living    09/20/2022    8:48 AM  In your present state of health, do you have any difficulty performing the following activities:  Hearing? 1  Comment deaf in left ear  Vision? 0  Difficulty concentrating or making decisions? 0  Walking or climbing stairs? 0  Dressing or bathing? 0  Doing errands, shopping? 0  Preparing Food and eating ? N  Using the Toilet? N  In the past six months, have you accidently leaked urine? N  Do you have problems with loss of bowel control? N  Managing your Medications? N  Managing your Finances? N  Housekeeping or managing your Housekeeping? N    Patient Care Team: Tower, Wynelle Fanny, MD as PCP - General (Family Medicine) Magrinat, Virgie Dad, MD (Inactive) as Consulting Physician (Oncology) Dian Queen, MD as Consulting Physician (Obstetrics and Gynecology) Eula Flax, OD as Referring Physician (Ophthalmology) Clyde Canterbury, MD as Referring Physician (Otolaryngology) Oneta Rack, MD as Consulting Physician (Dermatology) Ocie Cornfield, DDS as Consulting Physician (Dentistry) Minna Merritts, MD as  Consulting Physician (Cardiology) Charlton Haws, [facility] as Pharmacist (Pharmacist)  Indicate any recent Medical Services you may have received from other than [facility] providers in the past year (date may be approximate).     Assessment:   This is a routine wellness examination for Le[place].  Hearing/Vision screen Hearing Screening - Comments:: Pt stated deaf in left ear  Vision Screening - Comments:: Pt follows up with Dr Wallace Going for annul eye exams   Dietary issues and exercise activities discussed: Current Exercise Habits: Home exercise routine, Type of exercise: walking, Time (Minutes): 60, Frequency (Times/Week): 7, Weekly Exercise (Minutes/Week): 420   Goals Addressed             This Visit's Progress    Patient Stated       Maintain health and activity        Depression Screen    09/20/2022    8:49 AM 07/25/2022    2:06 PM 07/04/2022    8:48 AM 07/03/2021   10:41 AM 06/22/2020    1:22 PM 06/16/2019    2:30 PM 05/26/2018    9:24 AM  PHQ 2/9 Scores  PHQ - 2 Score 0 0 0 0 0 0 0  PHQ- 9 Score     0  0    Fall Risk    09/20/2022    8:47 AM 07/25/2022    2:05 PM 07/04/2022    8:48 AM 07/03/2021   10:41 AM 06/22/2020    1:22 PM  Fall Risk   Falls in the past year? 0 0 0 0 0  Number falls in past yr: 0    0  Injury with Fall? 0    0  Risk for fall due to : No Fall Risks;Impaired vision    Medication side effect  Follow up Falls prevention discussed Falls evaluation completed Falls evaluation completed Falls evaluation completed Falls evaluation completed;Falls prevention discussed    FALL RISK PREVENTION PERTAINING TO THE HOME:  Any stairs in or around the home? No  If so, are there any without handrails? No  Home free of loose throw rugs in walkways, pet beds, electrical cords, etc? Yes  Adequate lighting in your home to reduce risk of falls? Yes   ASSISTIVE DEVICES UTILIZED TO PREVENT FALLS:  Life alert? No  Use of a cane, walker or w/c? No  Grab bars in the  bathroom? No  Shower chair or bench in shower? Yes  Elevated toilet seat or a handicapped toilet? No   TIMED UP AND GO:  Was the test performed? No .  Cognitive Function:    06/22/2020    1:24 PM 05/26/2018    9:22 AM 05/20/2017    9:17 AM 05/11/2016    8:53 AM  MMSE - Mini Mental State Exam  Not completed: Refused     Orientation to time  '5 5 5  '$ Orientation to Place  '5 5 5  '$ Registration  '3 3 3  '$ Attention/ Calculation  0 0 0  Recall  '3 3 3  '$ Language- name 2 objects  0 0 0  Language- repeat  '1 1 1  '$ Language- follow 3 step command  '3 3 3  '$ Language- read & follow direction  0 0 0  Write a sentence  0 0 0  Copy design  0 0 0  Total score  '20 20 20        '$ 09/20/2022    8:51 AM  6CIT Screen  What Year? 0 points  What month? 0 points  What time? 0 points  Count back from 20 0 points  Months in reverse 0 points  Repeat phrase 0 points  Total Score 0 points    Immunizations Immunization History  Administered Date(s) Administered   Fluad Quad(high Dose 65+) 08/27/2019   Influenza Split 09/17/2011   Influenza Whole 10/17/2007, 09/27/2008, 09/23/2009   Influenza, High Dose Seasonal PF 09/19/2015, 08/26/2017, 08/23/2020, 09/08/2021   Influenza-Unspecified 09/01/2013, 10/19/2014, 08/24/2016, 10/21/2018   PFIZER(Purple Top)SARS-COV-2 Vaccination 12/29/2019, 01/19/2020, 09/15/2020   Pneumococcal Conjugate-13 05/13/2015   Pneumococcal Polysaccharide-23 12/16/2006, 05/11/2014   Td 12/03/2005   Tdap 12/10/2017   Zoster Recombinat (Shingrix) 05/21/2018, 08/12/2018   Zoster, Live 12/03/2007    TDAP status: Up to date  Flu Vaccine status: Due, Education has been provided regarding the importance of this vaccine. Advised may receive this vaccine at local pharmacy or Health Dept. Aware to provide a copy of the vaccination record if obtained from local pharmacy or Health Dept. Verbalized acceptance and understanding.  Pneumococcal vaccine status: Up to date  Covid-19 vaccine  status: Completed vaccines  Qualifies for Shingles Vaccine? Yes   Zostavax completed Yes   Shingrix Completed?: Yes  Screening Tests Health Maintenance  Topic Date Due   COVID-19 Vaccine (4 - Pfizer risk series) 11/10/2020   INFLUENZA VACCINE  07/03/2022   Hepatitis C Screening  06/20/2030 (Originally 05/17/1959)   MAMMOGRAM  01/23/2023   TETANUS/TDAP  12/11/2027   Pneumonia Vaccine 60+ Years old  Completed   DEXA SCAN  Completed   Zoster Vaccines- Shingrix  Completed   HPV VACCINES  Aged Out   PAP SMEAR-Modifier  Discontinued   COLONOSCOPY (Pts 45-69yr Insurance coverage will need to be confirmed)  Discontinued    Health Maintenance  Health Maintenance Due  Topic Date Due   COVID-19 Vaccine (4 - PTurleyrisk series) 11/10/2020   INFLUENZA VACCINE  07/03/2022    Colorectal cancer screening: Type of screening: Colonoscopy. Completed 08/16/21. Repeat every 3 per pt  years  Mammogram status: Completed 07/23/22. Repeat every year  Bone Density status: Completed 07/23/22. Results reflect: Bone density results: OSTEOPOROSIS. Repeat every 2 years.  Additional Screening:  Hepatitis C Screening: does qualify;  Vision Screening: Recommended annual ophthalmology exams for early detection of glaucoma and other disorders of the eye. Is the patient up to date with their annual eye exam?  Yes  Who is the provider or what is the name of the office in which the patient attends annual eye exams? Dr BWallace Going If pt is not established with a provider, would they like to be referred to a provider to establish care? No .   Dental Screening: Recommended annual dental exams for proper oral hygiene  Community Resource Referral / Chronic Care Management: CRR required this visit?  No   CCM required this visit?  No      Plan:     I have personally reviewed and noted the following in the patient's chart:   Medical and social history Use of alcohol, tobacco or illicit drugs  Current  medications and supplements including opioid prescriptions. Patient is not currently taking opioid prescriptions. Functional ability and status Nutritional status Physical activity Advanced directives List of other physicians Hospitalizations, surgeries, and ER visits in previous 12 months Vitals Screenings to include cognitive, depression, and falls Referrals and appointments  In addition, I have reviewed and discussed with patient certain preventive protocols, quality metrics, and best practice recommendations. A written personalized care plan for preventive services  as well as general preventive health recommendations were provided to patient.     Willette Brace, LPN   [phone]   Nurse Notes: none

## 2022-09-26 ENCOUNTER — Other Ambulatory Visit: Payer: Self-pay | Admitting: Family Medicine

## 2022-09-27 ENCOUNTER — Telehealth: Payer: Self-pay | Admitting: Family Medicine

## 2022-09-27 ENCOUNTER — Encounter: Payer: Self-pay | Admitting: Family Medicine

## 2022-09-27 NOTE — Telephone Encounter (Signed)
Patient would like medication Cefdinir 300 mg called in to   [place], [place] Phone:  [phone]  Fax:  [phone]

## 2022-09-27 NOTE — Telephone Encounter (Signed)
Patient called in and would like to know if this medication would be approved for her?

## 2022-09-27 NOTE — Telephone Encounter (Signed)
For what?   Thanks

## 2022-09-28 ENCOUNTER — Ambulatory Visit
Admission: EM | Admit: 2022-09-28 | Discharge: 2022-09-28 | Disposition: A | Discharge status: Home / Self Care | Payer: PPO | Attending: Urgent Care | Admitting: Urgent Care

## 2022-09-28 DIAGNOSIS — B9689 Other specified bacterial agents as the cause of diseases classified elsewhere: Secondary | ICD-10-CM

## 2022-09-28 DIAGNOSIS — J019 Acute sinusitis, unspecified: Secondary | ICD-10-CM

## 2022-09-28 MED ORDER — CEFDINIR 300 MG PO CAPS: 300.0000 mg | ORAL_CAPSULE | Freq: Two times a day (BID) | ORAL | 0 refills | Status: AC

## 2022-09-28 NOTE — Telephone Encounter (Signed)
I am not in the office today  Aware, will watch for correspondence

## 2022-09-28 NOTE — Discharge Instructions (Addendum)
Follow up here or with your primary care provider if your symptoms are worsening or not improving with treatment.     

## 2022-09-28 NOTE — Telephone Encounter (Signed)
error 

## 2022-09-28 NOTE — ED Provider Notes (Signed)
[facility]    CSN: [id number] Arrival date & time: 09/28/22  1536      History   Chief Complaint Chief Complaint  Patient presents with   Facial Pain    HPI Gabrielle Webb is a 81 y.o. female.   HPI  Presents to UC with complaint of sinus pressure x2 weeks.  She endorses pain on the right side of her face around and beneath her eye.  She states pain is radiating down the right side of her face into her ear and endorses reduced hearing.  Denies fever, chills, myalgias.  Past Medical History:  Diagnosis Date   Allergy    Anxiety    Breast cancer ([place]) 7/12   Right   Cellulitis    Diverticulosis of colon    GERD (gastroesophageal reflux disease)    Headache    sinus   History of hiatal hernia    Hx of radiation therapy 06/20/11 - 07/11/11   right breast   Hypothyroid    Osteoporosis    Personal history of radiation therapy 2012   Right Breast Cancer   PONV (postoperative nausea and vomiting)    Skin cancer    basal and squamous cell   Vertigo     Patient Active Problem List   Diagnosis Date Noted   Grief reaction 07/04/2022   Encounter for screening mammogram for breast cancer 12/18/2021   Anxious mood as adjustment reaction 06/09/2021   Breast lump on right side at 8 o'clock position 05/03/2020   History of breast cancer 05/03/2020   Medicare annual wellness visit, subsequent 05/28/2018   Estrogen deficiency 05/28/2018   Fatigue 05/22/2017   Chest discomfort 05/22/2017   S/P TAH-BSO 11/08/2016   Routine general medical examination at a health care facility 05/06/2016   Malignant neoplasm of upper-outer quadrant of right breast in female, estrogen receptor positive ([place]) 03/21/2015   History of tamoxifen therapy 05/18/2014   Encounter for routine gynecological examination 05/06/2013   Colon cancer screening 05/06/2013   Encounter for Medicare annual wellness exam 04/28/2013   Hx of radiation therapy    Hx of Breast cancer, stage 1, Right,  UOQ, Receptor+,Her2- 05/29/2011    Class: Stage 1   Other screening mammogram 03/21/2011   Post-menopausal 03/21/2011   Hypothyroidism 01/23/2008   Pure hypercholesterolemia 01/23/2008   ALLERGIC RHINITIS 01/23/2008   GERD 01/23/2008   DIVERTICULOSIS, COLON 01/23/2008   IBS 01/23/2008   FIBROCYSTIC BREAST DISEASE 01/23/2008   Osteoporosis 10/17/2007    Past Surgical History:  Procedure Laterality Date   ABDOMINAL HYSTERECTOMY N/A 11/08/2016   Procedure: HYSTERECTOMY TOTAL  ABDOMINAL;  Surgeon: Dian Queen, MD;  Location: [facility];  Service: Gynecology;  Laterality: N/A;   APPENDECTOMY  07/2007   back sugery  1970   BREAST BIOPSY Left    Benign   BREAST EXCISIONAL BIOPSY Left 1983   BREAST EXCISIONAL BIOPSY Left 1983   BREAST LUMPECTOMY Right 05/16/2011   COLONOSCOPY     COLONOSCOPY WITH PROPOFOL N/A 08/27/2018   Procedure: COLONOSCOPY WITH PROPOFOL;  Surgeon: Manya Silvas, MD;  Location[facility] END[facility]COPY;  Service: Endoscopy;  Laterality: N/A;   COLONOSCOPY WITH PROPOFOL N/A 08/16/2021   Procedure: COLONOSCOPY WITH PROPOFOL;  Surgeon: Robert Bellow, MD;  Location: [facility] ENDOSCOPY;  Service: Endoscopy;  Laterality: N/A;   DIAGNOSTIC LAPAROSCOPY     DILATION AND CURETTAGE OF UTERUS N/A 05/31/2014   Procedure: DILATATION AND CURETTAGE WITH ULTRASOUND GUIDANCE;  Surgeon: Cyril Mourning, MD;  Location:  [facility];  Service: Gynecology;  Laterality: N/A;   ESOPHAGOGASTRODUODENOSCOPY  04/20/2003   KNEE SURGERY  1962   MASTECTOMY     right eye surgery     SALPINGOOPHORECTOMY Bilateral 11/08/2016   Procedure: BILATERAL SALPINGO OOPHORECTOMY;  Surgeon: Dian Queen, MD;  Location: [facility];  Service: Gynecology;  Laterality: Bilateral;   UPPER GI ENDOSCOPY      OB History   No obstetric history on file.      Home Medications    Prior to Admission medications   Medication Sig Start Date End Date Taking? Authorizing Provider  atorvastatin (LIPITOR) 10 MG tablet Take 1  tablet (10 mg total) by mouth daily. 07/25/22   Tower, Wynelle Fanny, MD  BIOTIN PO Take by mouth daily.    [provider]  CALCIUM PO Take 1,500 mg by mouth daily.    [provider]  carboxymethylcellulose (REFRESH PLUS) 0.5 % SOLN 1 drop 3 (three) times daily as needed.    [provider]  chlorthalidone (HYGROTON) 25 MG tablet TAKE TWO TABLETS EVERY DAY AS NEEDED FORSWELLING 06/28/21   Tower, Wynelle Fanny, MD  Cholecalciferol (VITAMIN D) 2000 UNITS CAPS Take 1 capsule by mouth daily.    [provider]  Cyanocobalamin (B-12 PO) Take 2,500 mcg by mouth daily.    [provider]  denosumab (PROLIA) 60 MG/ML SOSY injection Inject 60 mg into the skin every 6 (six) months. 05/03/22   Tower, Wynelle Fanny, MD  diclofenac sodium (VOLTAREN) 1 % GEL Apply 2 g topically 4 (four) times daily. Rub into affected area of foot 2 to 4 times daily Patient taking differently: Apply 2 g topically as needed. Rub into affected area of foot 2 to 4 times daily 12/21/14   Trula Slade, DPM  dicyclomine (BENTYL) 10 MG capsule TAKE 1 CAPSULE BY MOUTH TWICE DAILY FOR SPASMS 09/22/21   Tower, Wynelle Fanny, MD  escitalopram (LEXAPRO) 5 MG tablet Take 1 tablet (5 mg total) by mouth daily. 07/25/22   Tower, Wynelle Fanny, MD  famotidine (PEPCID) 20 MG tablet TAKE ONE TABLET EVERY DAY AS NEEDED FOR HEARTBURN OR INDIGESTION 07/25/22   Tower, Wynelle Fanny, MD  Flaxseed, Linseed, (FLAX SEED OIL PO) Take by mouth. 2 a day    [provider]  fluticasone (FLONASE) 50 MCG/ACT nasal spray SPRAY TWICE INTO EACH NOSTRIL ONCE DAILY 04/19/22   Tower, Wynelle Fanny, MD  levothyroxine (SYNTHROID) 75 MCG tablet TAKE ONE TABLET BY MOUTH DAILY BEFORE BREAKFAST 07/25/22   Tower, Wynelle Fanny, MD  Multiple Vitamin (MULTIVITAMIN) capsule Take 1 capsule by mouth daily.    [provider]  Naproxen Sodium (ALEVE PO) Take 1 tablet by mouth as needed.    [provider]  Phenylephrine-APAP-guaiFENesin (TYLENOL SINUS SEVERE  PO) Take by mouth.    [provider]  Propylene Glycol (SYSTANE COMPLETE OP) Apply to eye.    [provider]    Family History Family History  Problem Relation Age of Onset   Atrial fibrillation Mother    Coronary artery disease Mother    Congenital heart disease Mother    Heart attack Father 29   Hypertension Father    Diabetes Maternal Aunt    Cancer Maternal Aunt        leukemia   Diabetes Maternal Uncle    Cancer Maternal Uncle        colon   Diabetes Other        Grandmother   Coronary artery disease  Other        Grandmother   Uterine cancer Other        Grandmother   Leukemia Other        Aunt    Social History Social History   Tobacco Use   Smoking status: Never   Smokeless tobacco: Never  Vaping Use   Vaping Use: Never used  Substance Use Topics   Alcohol use: No    Alcohol/week: 0.0 standard drinks of alcohol   Drug use: No     Allergies   Codeine, Fentanyl, Hydromorphone, Morphine, Ciprofloxacin, Tape, and Prednisone   Review of Systems Review of Systems   Physical Exam Triage Vital Signs ED Triage Vitals [09/28/22 1549]  Enc Vitals Group     BP 112/80     Pulse Rate 69     Resp 17     Temp 97.7 F (36.5 C)     Temp src      SpO2 98 %     Weight      Height      Head Circumference      Peak Flow      Pain Score 6     Pain Loc      Pain Edu?      Excl. in [place]?    No data found.  Updated Vital Signs BP 112/80   Pulse 69   Temp 97.7 F (36.5 C)   Resp 17   LMP 12/03/1988   SpO2 98%   Visual Acuity Right Eye Distance:   Left Eye Distance:   Bilateral Distance:    Right Eye Near:   Left Eye Near:    Bilateral Near:     Physical Exam Vitals reviewed.  Constitutional:      Appearance: Normal appearance.  HENT:     Nose:     Right Sinus: Maxillary sinus tenderness present.  Skin:    General: Skin is warm and dry.  Neurological:     General: No focal deficit present.     Mental Status: She is  alert and oriented to person, place, and time.  Psychiatric:        Mood and Affect: Mood normal.        Behavior: Behavior normal.      UC Treatments / Results  Labs (all labs ordered are listed, but only abnormal results are displayed) Labs Reviewed - No data to display  EKG   Radiology No results found.  Procedures Procedures (including critical care time)  Medications Ordered in UC Medications - No data to display  Initial Impression / Assessment and Plan / UC Course  I have reviewed the triage vital signs and the nursing notes.  Pertinent labs & imaging results that were available during my care of the patient were reviewed by me and considered in my medical decision making (see chart for details).   Treating for acute maxillary sinusitis with cefdinir which she has tolerated previously.   Final Clinical Impressions(s) / UC Diagnoses   Final diagnoses:  None   Discharge Instructions   None    ED Prescriptions   None    PDMP not reviewed this encounter.   Rose Phi, [place] 09/28/22 1558

## 2022-09-28 NOTE — ED Triage Notes (Signed)
Pt. Presents to UC w/ c/o sinus pressure for the past two weeks. Pt. Expresses concern for Sinus infection. Pt. Has been treating herself w/ OTC medication.

## 2022-09-28 NOTE — Telephone Encounter (Signed)
Pt said has a sinus infection and wants antibiotics. Pt advised that we usually require an appt before we send in abx to make sure she doesn't have something else going on and that abx are appropriate. Pt advise we have no appts today and I offered to schedule her an appt at a local urgent care. Pt said she is still in bed and when she gets up she will just walk in to her local UC.   FYI to PCP

## 2022-10-09 ENCOUNTER — Telehealth: Payer: Self-pay | Admitting: Family Medicine

## 2022-10-09 ENCOUNTER — Telehealth: Payer: Self-pay

## 2022-10-09 NOTE — Telephone Encounter (Signed)
Pt called asking Dr. Glori Bickers is it suggested for her to get the new covid shot & the rsv shot? Pt mentioned she is getting her flu shot today, 10/09/22. Call back # [phone]

## 2022-10-09 NOTE — Telephone Encounter (Signed)
Pt notified of Dr. Tower's comments  

## 2022-10-09 NOTE — Telephone Encounter (Signed)
I recommend them  The covid shot first   Will need to check on cost of the RSV because not all medicare plans cover it   Will need to get both of those in the pharmacy as well

## 2022-10-09 NOTE — Chronic Care Management (AMB) (Signed)
Chronic Care Management Pharmacy Assistant   Name: Gabrielle Webb  MRN: [id number] DOB: 07-29-1941   Reason for Encounter: General Adherence    Recent office visits:  07/25/22-Marne Tower,MD(PCP)- AWV, discuss screenings, vaccines,diet and exercise- no medication changes  07/04/22-Marne Tower,MD(PCP)-ear pain, covid test, Will tx with cefdinir for acute sinusitis (her preference of med)   Recent consult visits:  08/01/22-Bradley Edison Pace (ophth)-no data 07/27/22-Arin Isenstein (derm)-no data  07/16/22-Michelle Grewal,MD(OB-GYN)-no data 06/29/22-Daniel Zedek (dermapatho)-no data 05/16/22-Timothy Gollan,MD(cardio)- initial visit,neck calcifications,EKG,US,CT ordered.  Hospital visits:  None in previous 6 months 09/28/22-[facility]-bacterial sinusitis, start cefdinir '300mg'$  take 2 times daily, discharged to home   Medications: Outpatient Encounter Medications as of 10/09/2022  Medication Sig   atorvastatin (LIPITOR) 10 MG tablet Take 1 tablet (10 mg total) by mouth daily.   BIOTIN PO Take by mouth daily.   CALCIUM PO Take 1,500 mg by mouth daily.   carboxymethylcellulose (REFRESH PLUS) 0.5 % SOLN 1 drop 3 (three) times daily as needed.   chlorthalidone (HYGROTON) 25 MG tablet TAKE TWO TABLETS EVERY DAY AS NEEDED FORSWELLING   Cholecalciferol (VITAMIN D) 2000 UNITS CAPS Take 1 capsule by mouth daily.   Cyanocobalamin (B-12 PO) Take 2,500 mcg by mouth daily.   denosumab (PROLIA) 60 MG/ML SOSY injection Inject 60 mg into the skin every 6 (six) months.   diclofenac sodium (VOLTAREN) 1 % GEL Apply 2 g topically 4 (four) times daily. Rub into affected area of foot 2 to 4 times daily (Patient taking differently: Apply 2 g topically as needed. Rub into affected area of foot 2 to 4 times daily)   dicyclomine (BENTYL) 10 MG capsule TAKE 1 CAPSULE BY MOUTH TWICE DAILY FOR SPASMS   escitalopram (LEXAPRO) 5 MG tablet Take 1 tablet (5 mg total) by mouth daily.   famotidine (PEPCID) 20  MG tablet TAKE ONE TABLET EVERY DAY AS NEEDED FOR HEARTBURN OR INDIGESTION   Flaxseed, Linseed, (FLAX SEED OIL PO) Take by mouth. 2 a day   fluticasone (FLONASE) 50 MCG/ACT nasal spray SPRAY TWICE INTO EACH NOSTRIL ONCE DAILY   levothyroxine (SYNTHROID) 75 MCG tablet TAKE ONE TABLET BY MOUTH DAILY BEFORE BREAKFAST   Multiple Vitamin (MULTIVITAMIN) capsule Take 1 capsule by mouth daily.   Naproxen Sodium (ALEVE PO) Take 1 tablet by mouth as needed.   Phenylephrine-APAP-guaiFENesin (TYLENOL SINUS SEVERE PO) Take by mouth.   Propylene Glycol (SYSTANE COMPLETE OP) Apply to eye.   No facility-administered encounter medications on file as of 10/09/2022.    Contacted [facility]KE[facility] SARRIS on 10/11/22 for general disease state and medication adherence call.   Patient is not more than 5 days past due for refill on the following medications per chart history:  [facility] Medications: Medication Name/mg Last Fill Days Supply  Atorvastatin '10mg'$   07/25/22 90    What concerns do you have about your medications? No concerns at this time   The patient denies side effects with their medications.   How often do you forget or accidentally miss a dose? Never  Do you use a pillbox? Yes  Are you having any problems getting your medications from your pharmacy? No  satisfied with [facility]   Has the cost of your medications been a concern? No  Since last visit with CPP, the following interventions have been made. The patient is taking calcium supplements.  The patient has had an ED visit since last contact. 09/28/22  Urgent care for sinusitis, rx for cefdinir '300mg'$   The  patient denies problems with their health. The patient has improved from sinus infection   Patient denies concerns or questions for Charlene Brooke, PharmD at this time.   Counseled patient on:  [place] job taking medications, Importance of taking medication daily without missed doses, Benefits of adherence packaging or a pillbox, and  Access to CCM team for any cost, medication or pharmacy concerns.   Care Gaps: Annual wellness visit in last year? Yes Most Recent BP reading:106/60  07/25/22   Summary of recommendations from last CCM Pharmacy visit (Date:01/08/22) Summary: -Initial CCM visit: pt endorses compliance with medications as prescribed -Pt wants to know if she still needs to take aspirin; per chart review she has no history of ASCVD; she is over 70 and based on ASPREE trial results there is no established benefit for aspirin in primary prevention in this population (a recent study also showed no benefit for preventing breast cancer recurrence) -She is no longer taking calcium   Recommendations/Changes made from today's visit: -Advised she can stop aspirin  -Recommended 1200 mg of calcium per day from dietary and supplementary sources  Upcoming appointments: No appointments scheduled within the next 30 days.  Charlene Brooke, CPP notified  [name], [place]  [phone]

## 2022-11-06 ENCOUNTER — Other Ambulatory Visit (HOSPITAL_COMMUNITY): Payer: Self-pay

## 2022-11-06 ENCOUNTER — Encounter: Payer: Self-pay | Admitting: Oncology

## 2022-11-06 ENCOUNTER — Telehealth: Payer: Self-pay

## 2022-11-06 DIAGNOSIS — M818 Other osteoporosis without current pathological fracture: Secondary | ICD-10-CM

## 2022-11-06 NOTE — Telephone Encounter (Signed)
Pharmacy Patient Advocate Encounter  Prolia due 12/02/22  Insurance verification completed.    The patient is insured through [place] test claims for: Prolia.  Pharmacy benefit copay: $200.00  Prolia verification of benefits submitted.

## 2022-11-08 NOTE — Telephone Encounter (Signed)
Will route to PCP, usually they keep it in their fridge and just bring med to nurse appt to get inj

## 2022-11-08 NOTE — Telephone Encounter (Signed)
If it requires the same temp as a regular fridge, yes.   Some immunizations require a slightly different temp and it depends on that- she may be able to find out from the pharmacy or base it on our imm fridge temp here  I will cc to Mendel Ryder to see if she knows also

## 2022-11-08 NOTE — Telephone Encounter (Signed)
Called patient she does not want to have to wait until first of the year to get prolia called in. [facility]. She wanted to know if she can have called in before the end of the year. She is aware that  she will have the injection after 12/31. Would like to know if she can keep in her fridge or bring by here for [name] to store?

## 2022-11-09 ENCOUNTER — Other Ambulatory Visit: Payer: Self-pay

## 2022-11-09 DIAGNOSIS — E0500 Thyrotoxicosis with diffuse goiter without thyrotoxic crisis or storm: Secondary | ICD-10-CM | POA: Diagnosis not present

## 2022-11-09 DIAGNOSIS — M810 Age-related osteoporosis without current pathological fracture: Secondary | ICD-10-CM

## 2022-11-09 MED ORDER — PROLIA 60 MG/ML ~~LOC~~ SOSY: 60.0000 mg | PREFILLED_SYRINGE | SUBCUTANEOUS | 0 refills | Status: DC

## 2022-11-09 NOTE — Telephone Encounter (Signed)
Please let pt know, thanks

## 2022-11-09 NOTE — Addendum Note (Signed)
Addended by: Loura Pardon A on: 11/09/2022 10:57 AM   Modules accepted: Orders

## 2022-11-09 NOTE — Telephone Encounter (Signed)
I put in future labs for bmet  Schedule non fasting labs for this - this result needs to be back before the shot   Her last shot was 06/01/22 I think   Schedule the shot accordingly / nurse visit    Will cc to [facility]  Thanks!

## 2022-11-09 NOTE — Telephone Encounter (Signed)
Patient called back about her prolia shot.

## 2022-11-09 NOTE — Telephone Encounter (Signed)
Spoke to Patient to let her know about the Prolia. Patient would like to know if she needs labs before the Prolia injection and if she can get the Prolia injection before the end of the month?

## 2022-11-09 NOTE — Telephone Encounter (Signed)
Prolia prescription sent to [facility].  Pt is aware.  Lab appointment scheduled for 11/27/2022.  Nurse visit for Prolia injection scheduled for 12/05/22 at 11:30a. Pt is aware.

## 2022-11-09 NOTE — Telephone Encounter (Signed)
Called Patient and was going to schedule her for the Prolia and the BMP 1 week before but the  Patient states she has to have it on or before 12/02/22 and she would like to speak to you. Please call her.

## 2022-11-09 NOTE — Telephone Encounter (Signed)
Duplicate message. 

## 2022-11-12 NOTE — Telephone Encounter (Signed)
Lab order is in chart. Will check labs once they are back.  No further action needed at this time.

## 2022-11-27 ENCOUNTER — Other Ambulatory Visit (INDEPENDENT_AMBULATORY_CARE_PROVIDER_SITE_OTHER): Payer: PPO

## 2022-11-27 DIAGNOSIS — M818 Other osteoporosis without current pathological fracture: Secondary | ICD-10-CM | POA: Diagnosis not present

## 2022-11-27 LAB — BASIC METABOLIC PANEL
BUN: 13 mg/dL (ref 6–23)
CO2: 30 mEq/L (ref 19–32)
Calcium: 9.4 mg/dL (ref 8.4–10.5)
Chloride: 105 mEq/L (ref 96–112)
Creatinine, Ser: 0.8 mg/dL (ref 0.40–1.20)
GFR: 70.02 mL/min (ref >60.00)
Glucose, Bld: 75 mg/dL (ref 70–99)
Potassium: 3.9 mEq/L (ref 3.5–5.1)
Sodium: 143 mEq/L (ref 135–145)

## 2022-12-04 ENCOUNTER — Other Ambulatory Visit: Payer: Self-pay | Admitting: Family Medicine

## 2022-12-04 DIAGNOSIS — Z1231 Encounter for screening mammogram for malignant neoplasm of breast: Secondary | ICD-10-CM

## 2022-12-05 ENCOUNTER — Ambulatory Visit (INDEPENDENT_AMBULATORY_CARE_PROVIDER_SITE_OTHER): Payer: PPO

## 2022-12-05 DIAGNOSIS — M810 Age-related osteoporosis without current pathological fracture: Secondary | ICD-10-CM

## 2022-12-05 MED ORDER — DENOSUMAB 60 MG/ML ~~LOC~~ SOSY
60.0000 mg | PREFILLED_SYRINGE | Freq: Once | SUBCUTANEOUS | Status: AC
  Administered 2022-12-05: 60 mg via SUBCUTANEOUS

## 2022-12-05 NOTE — Progress Notes (Signed)
Per orders of Dr. Loura Pardon, injection of Prolia 60 mg given in left arm given by Ozzie Hoyle. Patient tolerated injection well.

## 2022-12-11 ENCOUNTER — Telehealth: Payer: Self-pay | Admitting: Family Medicine

## 2022-12-11 NOTE — Telephone Encounter (Signed)
Patient called in and wanted to know if she really needed to do the telephone call on January 07, 2023. Please advise. Thank you!

## 2022-12-12 NOTE — Telephone Encounter (Signed)
I saw patient a year ago to discuss medications. Per chart review she has followed up regularly with PCP and has not had any hospitalizations or ED visits in the past year. I would be happy to go over/discuss medications and overall health maintenance with her on a yearly basis, but if she would rather cancel that is fine.  Gabrielle Webb - please discuss with patient

## 2022-12-12 NOTE — Telephone Encounter (Cosign Needed)
Contacted the patient. Counseled on purpose of care coordination. Patient does not feel this is necessary at this time as she follows closely with Dr.Tower. Patient will reach out to care coordination should any questions on medications or maintenance issues arise in the future.  Charlene Brooke, [place] notified  [name], [place]  [phone]

## 2023-01-07 ENCOUNTER — Encounter: Payer: PPO | Admitting: Pharmacist

## 2023-01-15 ENCOUNTER — Ambulatory Visit
Admission: EM | Admit: 2023-01-15 | Discharge: 2023-01-15 | Disposition: A | Discharge status: Home / Self Care | Payer: PPO | Attending: Urgent Care | Admitting: Urgent Care

## 2023-01-15 ENCOUNTER — Telehealth: Payer: Self-pay | Admitting: Family Medicine

## 2023-01-15 DIAGNOSIS — N898 Other specified noninflammatory disorders of vagina: Secondary | ICD-10-CM | POA: Insufficient documentation

## 2023-01-15 MED ORDER — FLUCONAZOLE 150 MG PO TABS: 150.0000 mg | ORAL_TABLET | Freq: Once | ORAL | 0 refills | Status: AC

## 2023-01-15 NOTE — Discharge Instructions (Addendum)
Follow up here or with your primary care provider if your symptoms are worsening or not improving.     

## 2023-01-15 NOTE — Telephone Encounter (Signed)
Please schedule in office appt with first available  If symptoms are severe then triage

## 2023-01-15 NOTE — ED Notes (Signed)
Provider triaged.

## 2023-01-15 NOTE — Telephone Encounter (Signed)
[email or web address]

## 2023-01-15 NOTE — ED Provider Notes (Signed)
[name]    CSN: [id number] Arrival date & time: 01/15/23  1247      History   Chief Complaint No chief complaint on file.   HPI Gabrielle Webb is a 82 y.o. female.   HPI  Patient presents to urgent care with complaint of "yeast infection" based on symptoms of vaginal itching.  She states she has had yeast infections before.  Has been trying to self treat with OTC Monistat without effectiveness.  Past Medical History:  Diagnosis Date   Allergy    Anxiety    Breast cancer ([place]) 7/12   Right   Cellulitis    Diverticulosis of colon    GERD (gastroesophageal reflux disease)    Headache    sinus   History of hiatal hernia    Hx of radiation therapy 06/20/11 - 07/11/11   right breast   Hypothyroid    Osteoporosis    Personal history of radiation therapy 2012   Right Breast Cancer   PONV (postoperative nausea and vomiting)    Skin cancer    basal and squamous cell   Vertigo     Patient Active Problem List   Diagnosis Date Noted   Grief reaction 07/04/2022   Encounter for screening mammogram for breast cancer 12/18/2021   Anxious mood as adjustment reaction 06/09/2021   Breast lump on right side at 8 o'clock position 05/03/2020   History of breast cancer 05/03/2020   Medicare annual wellness visit, subsequent 05/28/2018   Estrogen deficiency 05/28/2018   Fatigue 05/22/2017   Chest discomfort 05/22/2017   S/P TAH-BSO 11/08/2016   Routine general medical examination at a health care facility 05/06/2016   Malignant neoplasm of upper-outer quadrant of right breast in female, estrogen receptor positive ([place]) 03/21/2015   History of tamoxifen therapy 05/18/2014   Encounter for routine gynecological examination 05/06/2013   Colon cancer screening 05/06/2013   Encounter for Medicare annual wellness exam 04/28/2013   Hx of radiation therapy    Hx of Breast cancer, stage 1, Right, UOQ, Receptor+,Her2- 05/29/2011    Class: Stage 1   Other screening  mammogram 03/21/2011   Post-menopausal 03/21/2011   Hypothyroidism 01/23/2008   Pure hypercholesterolemia 01/23/2008   ALLERGIC RHINITIS 01/23/2008   GERD 01/23/2008   DIVERTICULOSIS, COLON 01/23/2008   IBS 01/23/2008   FIBROCYSTIC BREAST DISEASE 01/23/2008   Osteoporosis 10/17/2007    Past Surgical History:  Procedure Laterality Date   ABDOMINAL HYSTERECTOMY N/A 11/08/2016   Procedure: HYSTERECTOMY TOTAL  ABDOMINAL;  Surgeon: Dian Queen, MD;  Location: [facility];  Service: Gynecology;  Laterality: N/A;   APPENDECTOMY  07/2007   back sugery  1970   BREAST BIOPSY Left    Benign   BREAST EXCISIONAL BIOPSY Left 1983   BREAST EXCISIONAL BIOPSY Left 1983   BREAST LUMPECTOMY Right 05/16/2011   COLONOSCOPY     COLONOSCOPY WITH PROPOFOL N/A 08/27/2018   Procedure: COLONOSCOPY WITH PROPOFOL;  Surgeon: Manya Silvas, MD;  Location: [facility]COPY;  Service: Endoscopy;  Laterality: N/A;   COLONOSCOPY WITH PROPOFOL N/A 08/16/2021   Procedure: COLONOSCOPY WITH PROPOFOL;  Surgeon: Robert Bellow, MD;  Location: [facility] ENDOSCOPY;  Service: Endoscopy;  Laterality: N/A;   DIAGNOSTIC LAPAROSCOPY     DILATION AND CURETTAGE OF UTERUS N/A 05/31/2014   Procedure: DILATATION AND CURETTAGE WITH ULTRASOUND GUIDANCE;  Surgeon: Cyril Mourning, MD;  Location: [place] [facility];  Service: Gynecology;  Laterality: N/A;   ESOPHAGOGASTRODUODENOSCOPY  04/20/2003   [place]  MASTECTOMY     right eye surgery     SALPINGOOPHORECTOMY Bilateral 11/08/2016   Procedure: BILATERAL SALPINGO OOPHORECTOMY;  Surgeon: Dian Queen, MD;  Location: [place] [facility];  Service: Gynecology;  Laterality: Bilateral;   UPPER GI ENDOSCOPY      OB History   No obstetric history on file.      Home Medications    Prior to Admission medications   Medication Sig Start Date End Date Taking? Authorizing Provider  atorvastatin (LIPITOR) 10 MG tablet Take 1 tablet (10 mg total) by mouth daily. 07/25/22   Tower, Wynelle Fanny, MD   BIOTIN PO Take by mouth daily.    [provider]  CALCIUM PO Take 1,500 mg by mouth daily.    [provider]  carboxymethylcellulose (REFRESH PLUS) 0.5 % SOLN 1 drop 3 (three) times daily as needed.    [provider]  chlorthalidone (HYGROTON) 25 MG tablet TAKE TWO TABLETS EVERY DAY AS NEEDED FORSWELLING 06/28/21   Tower, Wynelle Fanny, MD  Cholecalciferol (VITAMIN D) 2000 UNITS CAPS Take 1 capsule by mouth daily.    [provider]  Cyanocobalamin (B-12 PO) Take 2,500 mcg by mouth daily.    [provider]  denosumab (PROLIA) 60 MG/ML SOSY injection Inject 60 mg into the skin every 6 (six) months. 11/09/22   Tower, Wynelle Fanny, MD  diclofenac sodium (VOLTAREN) 1 % GEL Apply 2 g topically 4 (four) times daily. Rub into affected area of foot 2 to 4 times daily Patient taking differently: Apply 2 g topically as needed. Rub into affected area of foot 2 to 4 times daily 12/21/14   Trula Slade, DPM  dicyclomine (BENTYL) 10 MG capsule TAKE 1 CAPSULE BY MOUTH TWICE DAILY FOR SPASMS 09/22/21   Tower, Wynelle Fanny, MD  escitalopram (LEXAPRO) 5 MG tablet Take 1 tablet (5 mg total) by mouth daily. 07/25/22   Tower, Wynelle Fanny, MD  famotidine (PEPCID) 20 MG tablet TAKE ONE TABLET EVERY DAY AS NEEDED FOR HEARTBURN OR INDIGESTION 07/25/22   Tower, Wynelle Fanny, MD  Flaxseed, Linseed, (FLAX SEED OIL PO) Take by mouth. 2 a day    [provider]  fluticasone (FLONASE) 50 MCG/ACT nasal spray SPRAY TWICE INTO EACH NOSTRIL ONCE DAILY 04/19/22   Tower, Wynelle Fanny, MD  levothyroxine (SYNTHROID) 75 MCG tablet TAKE ONE TABLET BY MOUTH DAILY BEFORE BREAKFAST 07/25/22   Tower, Wynelle Fanny, MD  Multiple Vitamin (MULTIVITAMIN) capsule Take 1 capsule by mouth daily.    [provider]  Naproxen Sodium (ALEVE PO) Take 1 tablet by mouth as needed.    [provider]  Phenylephrine-APAP-guaiFENesin (TYLENOL SINUS SEVERE PO) Take by mouth.    [provider]  Propylene  Glycol (SYSTANE COMPLETE OP) Apply to eye.    [provider]    Family History Family History  Problem Relation Age of Onset   Atrial fibrillation Mother    Coronary artery disease Mother    Congenital heart disease Mother    Heart attack Father 96   Hypertension Father    Diabetes Maternal Aunt    Cancer Maternal Aunt        leukemia   Diabetes Maternal Uncle    Cancer Maternal Uncle        colon   Diabetes Other        Grandmother   Coronary artery disease Other        Grandmother   Uterine cancer Other  Grandmother   Leukemia Other        Aunt    Social History Social History   Tobacco Use   Smoking status: Never   Smokeless tobacco: Never  Vaping Use   Vaping Use: Never used  Substance Use Topics   Alcohol use: No    Alcohol/week: 0.0 standard drinks of alcohol   Drug use: No     Allergies   Codeine, Fentanyl, Hydromorphone, Morphine, Ciprofloxacin, Tape, and Prednisone   Review of Systems Review of Systems   Physical Exam Triage Vital Signs ED Triage Vitals  Enc Vitals Group     BP      Pulse      Resp      Temp      Temp src      SpO2      Weight      Height      Head Circumference      Peak Flow      Pain Score      Pain Loc      Pain Edu?      Excl. in [place]?    No data found.  Updated Vital Signs LMP 12/03/1988   Visual Acuity Right Eye Distance:   Left Eye Distance:   Bilateral Distance:    Right Eye Near:   Left Eye Near:    Bilateral Near:     Physical Exam Vitals reviewed.  Constitutional:      Appearance: Normal appearance.  Neurological:     General: No focal deficit present.     Mental Status: She is alert and oriented to person, place, and time.  Psychiatric:        Mood and Affect: Mood normal.        Behavior: Behavior normal.      UC Treatments / Results  Labs (all labs ordered are listed, but only abnormal results are displayed) Labs Reviewed - No data to  display  EKG   Radiology No results found.  Procedures Procedures (including critical care time)  Medications Ordered in UC Medications - No data to display  Initial Impression / Assessment and Plan / UC Course  I have reviewed the triage vital signs and the nursing notes.  Pertinent labs & imaging results that were available during my care of the patient were reviewed by me and considered in my medical decision making (see chart for details).   Patient is prescribed Diflucan based on her description of history.  Vaginal swab was obtained and results pending.   Final Clinical Impressions(s) / UC Diagnoses   Final diagnoses:  None   Discharge Instructions   None    ED Prescriptions   None    PDMP not reviewed this encounter.   Rose Phi, [place] 01/15/23 1316

## 2023-01-15 NOTE — Telephone Encounter (Signed)
Patient called in stating thst she has a yeast infection. She would like to know if an antibiotic can be called in for her or any advice as to what she can take OTC to help. She said that she is trying monistat  but it is not working.

## 2023-01-16 LAB — CERVICOVAGINAL ANCILLARY ONLY
Bacterial Vaginitis (gardnerella): NEGATIVE
Candida Glabrata: NEGATIVE
Candida Vaginitis: NEGATIVE
Comment: NEGATIVE
Comment: NEGATIVE
Comment: NEGATIVE

## 2023-01-17 ENCOUNTER — Telehealth: Payer: Self-pay | Admitting: Family Medicine

## 2023-01-17 NOTE — Telephone Encounter (Signed)
Scheduled f/u for 01/18/23

## 2023-01-17 NOTE — Telephone Encounter (Signed)
Patient called in and stated she was seen in Urgent Care on Tuesday. She has tried to contact them but haven't been able to get in touch with anyone. She had some questions regarding what it could be and is very anxious. She can be reached at [phone]. She said please call her. Please advise. Thank you!

## 2023-01-17 NOTE — Telephone Encounter (Addendum)
Per Dr. Glori Bickers. Please schedule pt a f/u with Dr. Glori Bickers to eval sxs we can't advise what's going on without seeing her 1st

## 2023-01-18 ENCOUNTER — Encounter: Payer: Self-pay | Admitting: Family Medicine

## 2023-01-18 ENCOUNTER — Ambulatory Visit (INDEPENDENT_AMBULATORY_CARE_PROVIDER_SITE_OTHER): Payer: PPO | Admitting: Family Medicine

## 2023-01-18 VITALS — BP 128/78 | HR 95 | Temp 97.7°F | Ht 63.0 in | Wt 131.1 lb

## 2023-01-18 DIAGNOSIS — R102 Pelvic and perineal pain: Secondary | ICD-10-CM | POA: Diagnosis not present

## 2023-01-18 LAB — POCT WET PREP (WET MOUNT): Trichomonas Wet Prep HPF POC: ABSENT

## 2023-01-18 MED ORDER — CLOBETASOL PROPIONATE 0.05 % EX CREA: 1.0000 | TOPICAL_CREAM | Freq: Every day | CUTANEOUS | 0 refills | Status: DC

## 2023-01-18 NOTE — Progress Notes (Unsigned)
Subjective:    Patient ID: Gabrielle Webb, female    DOB: 10-12-1941, 82 y.o.   MRN: [id number]  HPI Pt presents for vaginal problem , f/u from [facility]  Wt Readings from Last 3 Encounters:  01/18/23 131 lb 2 oz (59.5 kg)  07/25/22 134 lb 9.6 oz (61.1 kg)  07/04/22 134 lb (60.8 kg)   23.23 kg/m  Vitals:   01/18/23 0817  BP: 128/78  Pulse: 95  Temp: 97.7 F (36.5 C)  SpO2: 100%   She was seen at [facility] on 2/13 for vaginal itching  Was dx with likely yeast vaginitis and px diflucan   Her wet prep returned entirely normal   Diflucan may have improved a little   Redness Pain in vaginal area in and outside  No discharge  No odor   Not sexually active at all    No recent abx  No probiotics   Was on tamoxifen in the past  Has had a hysterectomy   Results for orders placed or performed in visit on 01/18/23  POCT Wet Prep [facility])  Result Value Ref Range   Source Wet Prep POC vaginal    WBC, Wet Prep HPF POC none    Bacteria Wet Prep HPF POC Few Few   BACTERIA WET PREP MORPHOLOGY POC     Clue Cells Wet Prep HPF POC None None   Clue Cells Wet Prep [place] POC     Yeast Wet Prep HPF POC None None   KOH Wet Prep POC None None   Trichomonas Wet Prep HPF POC Absent Absent    Patient Active Problem List   Diagnosis Date Noted   Vaginal pain 01/18/2023   Grief reaction 07/04/2022   Encounter for screening mammogram for breast cancer 12/18/2021   Anxious mood as adjustment reaction 06/09/2021   Breast lump on right side at 8 o'clock position 05/03/2020   History of breast cancer 05/03/2020   Medicare annual wellness visit, subsequent 05/28/2018   Estrogen deficiency 05/28/2018   Fatigue 05/22/2017   Chest discomfort 05/22/2017   S/P TAH-BSO 11/08/2016   Routine general medical examination at a health care facility 05/06/2016   Malignant neoplasm of upper-outer quadrant of right breast in female, estrogen receptor positive ([place]) 03/21/2015   History of tamoxifen  therapy 05/18/2014   Encounter for routine gynecological examination 05/06/2013   Colon cancer screening 05/06/2013   Encounter for Medicare annual wellness exam 04/28/2013   Hx of radiation therapy    Hx of Breast cancer, stage 1, Right, UOQ, Receptor+,Her2- 05/29/2011    Class: Stage 1   Other screening mammogram 03/21/2011   Post-menopausal 03/21/2011   Hypothyroidism 01/23/2008   Pure hypercholesterolemia 01/23/2008   ALLERGIC RHINITIS 01/23/2008   GERD 01/23/2008   DIVERTICULOSIS, COLON 01/23/2008   IBS 01/23/2008   FIBROCYSTIC BREAST DISEASE 01/23/2008   Osteoporosis 10/17/2007   Past Medical History:  Diagnosis Date   Allergy    Anxiety    Breast cancer ([place]) 7/12   Right   Cellulitis    Diverticulosis of colon    GERD (gastroesophageal reflux disease)    Headache    sinus   History of hiatal hernia    Hx of radiation therapy 06/20/11 - 07/11/11   right breast   Hypothyroid    Osteoporosis    Personal history of radiation therapy 2012   Right Breast Cancer   PONV (postoperative nausea and vomiting)    Skin cancer    basal and squamous  cell   Vertigo    Past Surgical History:  Procedure Laterality Date   ABDOMINAL HYSTERECTOMY N/A 11/08/2016   Procedure: HYSTERECTOMY TOTAL  ABDOMINAL;  Surgeon: Dian Queen, MD;  Location: [place] [facility];  Service: Gynecology;  Laterality: N/A;   APPENDECTOMY  07/2007   back sugery  1970   BREAST BIOPSY Left    Benign   BREAST EXCISIONAL BIOPSY Left 1983   BREAST EXCISIONAL BIOPSY Left 1983   BREAST LUMPECTOMY Right 05/16/2011   COLONOSCOPY     COLONOSCOPY WITH PROPOFOL N/A 08/27/2018   Procedure: COLONOSCOPY WITH PROPOFOL;  Surgeon: Manya Silvas, MD;  Location: [facility] ENDOSCOPY;  Service: Endoscopy;  Laterality: N/A;   COLONOSCOPY WITH PROPOFOL N/A 08/16/2021   Procedure: COLONOSCOPY WITH PROPOFOL;  Surgeon: Robert Bellow, MD;  Location: [facility] ENDOSCOPY;  Service: Endoscopy;  Laterality: N/A;   DIAGNOSTIC LAPAROSCOPY      DILATION AND CURETTAGE OF UTERUS N/A 05/31/2014   Procedure: DILATATION AND CURETTAGE WITH ULTRASOUND GUIDANCE;  Surgeon: Cyril Mourning, MD;  Location: [place] [facility];  Service: Gynecology;  Laterality: N/A;   ESOPHAGOGASTRODUODENOSCOPY  04/20/2003   KNEE SURGERY  1962   MASTECTOMY     right eye surgery     SALPINGOOPHORECTOMY Bilateral 11/08/2016   Procedure: BILATERAL SALPINGO OOPHORECTOMY;  Surgeon: Dian Queen, MD;  Location: [place] [facility];  Service: Gynecology;  Laterality: Bilateral;   UPPER GI ENDOSCOPY     Social History   Tobacco Use   Smoking status: Never   Smokeless tobacco: Never  Vaping Use   Vaping Use: Never used  Substance Use Topics   Alcohol use: No    Alcohol/week: 0.0 standard drinks of alcohol   Drug use: No   Family History  Problem Relation Age of Onset   Atrial fibrillation Mother    Coronary artery disease Mother    Congenital heart disease Mother    Heart attack Father 30   Hypertension Father    Diabetes Maternal Aunt    Cancer Maternal Aunt        leukemia   Diabetes Maternal Uncle    Cancer Maternal Uncle        colon   Diabetes Other        Grandmother   Coronary artery disease Other        Grandmother   Uterine cancer Other        Grandmother   Leukemia Other        Aunt   Allergies  Allergen Reactions   Codeine Nausea And Vomiting   Fentanyl Nausea And Vomiting   Hydromorphone Nausea And Vomiting        Morphine Nausea And Vomiting   Ciprofloxacin     Intolerant- tingling    Tape     Surgical tape   Prednisone Palpitations    Rapid hear beat   Current Outpatient Medications on File Prior to Visit  Medication Sig Dispense Refill   atorvastatin (LIPITOR) 10 MG tablet Take 1 tablet (10 mg total) by mouth daily. 90 tablet 3   BIOTIN PO Take by mouth daily.     CALCIUM PO Take 1,500 mg by mouth daily.     carboxymethylcellulose (REFRESH PLUS) 0.5 % SOLN 1 drop 3 (three) times daily as needed.     chlorthalidone (HYGROTON) 25 MG  tablet TAKE TWO TABLETS EVERY DAY AS NEEDED FORSWELLING 180 tablet 0   Cholecalciferol (VITAMIN D) 2000 UNITS CAPS Take 1 capsule by mouth daily.     Cyanocobalamin (B-12 PO)  Take 2,500 mcg by mouth daily.     denosumab (PROLIA) 60 MG/ML SOSY injection Inject 60 mg into the skin every 6 (six) months. 1 mL 0   diclofenac sodium (VOLTAREN) 1 % GEL Apply 2 g topically 4 (four) times daily. Rub into affected area of foot 2 to 4 times daily (Patient taking differently: Apply 2 g topically as needed. Rub into affected area of foot 2 to 4 times daily) 100 g 2   dicyclomine (BENTYL) 10 MG capsule TAKE 1 CAPSULE BY MOUTH TWICE DAILY FOR SPASMS 180 capsule 2   escitalopram (LEXAPRO) 5 MG tablet Take 1 tablet (5 mg total) by mouth daily. 90 tablet 3   famotidine (PEPCID) 20 MG tablet TAKE ONE TABLET EVERY DAY AS NEEDED FOR HEARTBURN OR INDIGESTION 90 tablet 3   Flaxseed, Linseed, (FLAX SEED OIL PO) Take by mouth. 2 a day     fluticasone (FLONASE) 50 MCG/ACT nasal spray SPRAY TWICE INTO EACH NOSTRIL ONCE DAILY 18 g 2   levothyroxine (SYNTHROID) 75 MCG tablet TAKE ONE TABLET BY MOUTH DAILY BEFORE BREAKFAST 90 tablet 3   Multiple Vitamin (MULTIVITAMIN) capsule Take 1 capsule by mouth daily.     Naproxen Sodium (ALEVE PO) Take 1 tablet by mouth as needed.     Phenylephrine-APAP-guaiFENesin (TYLENOL SINUS SEVERE PO) Take by mouth.     Propylene Glycol (SYSTANE COMPLETE OP) Apply to eye.     No current facility-administered medications on file prior to visit.     Review of Systems  Constitutional:  Negative for activity change, appetite change, fatigue, fever and unexpected weight change.  HENT:  Negative for congestion, ear pain, rhinorrhea, sinus pressure and sore throat.   Eyes:  Negative for pain, redness and visual disturbance.  Respiratory:  Negative for cough, shortness of breath and wheezing.   Cardiovascular:  Negative for chest pain and palpitations.  Gastrointestinal:  Negative for abdominal pain,  blood in stool, constipation and diarrhea.  Endocrine: Negative for polydipsia and polyuria.  Genitourinary:  Positive for vaginal pain. Negative for dysuria, frequency and urgency.       Vaginal discomfort- some burning, little to no itching   Musculoskeletal:  Negative for arthralgias, back pain and myalgias.  Skin:  Negative for pallor and rash.  Allergic/Immunologic: Negative for environmental allergies.  Neurological:  Negative for dizziness, syncope and headaches.  Hematological:  Negative for adenopathy. Does not bruise/bleed easily.  Psychiatric/Behavioral:  Negative for decreased concentration and dysphoric mood. The patient is not nervous/anxious.        Objective:   Physical Exam Exam conducted with a chaperone present.  Constitutional:      General: She is not in acute distress.    Appearance: Normal appearance. She is well-developed and normal weight. She is not ill-appearing or diaphoretic.  HENT:     Head: Normocephalic and atraumatic.  Eyes:     General:        Left eye: No discharge.     Conjunctiva/sclera: Conjunctivae normal.     Pupils: Pupils are equal, round, and reactive to light.  Neck:     Thyroid: No thyromegaly.     Vascular: No JVD.  Cardiovascular:     Rate and Rhythm: Normal rate and regular rhythm.     Heart sounds: Normal heart sounds.     No gallop.  Pulmonary:     Effort: Pulmonary effort is normal. No respiratory distress.     Breath sounds: Normal breath sounds. No wheezing or rales.  Abdominal:     General: There is no distension or abdominal bruit.     Palpations: Abdomen is soft. There is no mass.     Tenderness: There is no abdominal tenderness.     Comments: No suprapubic tenderness or fullness    Genitourinary:    Exam position: Supine.     Pubic Area: No rash.      Labia:        Right: Tenderness present.      Vagina: No signs of injury. No vaginal discharge, tenderness, bleeding or prolapsed vaginal walls.     Comments: Mild  vaginal dryness No discharge or odor or swelling Pale area on R labial minora noted  Wet prep swab obt (result was normal)    Musculoskeletal:     Cervical back: Neck supple.     Right lower leg: No edema.     Left lower leg: No edema.  Skin:    General: Skin is warm and dry.     Coloration: Skin is not pale.     Findings: No rash.  Neurological:     Mental Status: She is alert.     Coordination: Coordination normal.     Deep Tendon Reflexes: Reflexes are normal and symmetric. Reflexes normal.  Psychiatric:        Mood and Affect: Mood normal.           Assessment & Plan:   Problem List Items Addressed This Visit       Other   Vaginal pain - Primary    Seen in UC on 2/13 with neg wet prep- notes and wet prep result reviewed today Was tx with diflucan for poss yeast (before result returned)  Vaginal discomfort /outside and small area inside w/o discharge or other symptoms and is not sexually active  Today wet prep is normal  Pt has small area of pale skin on R labia minora/ cannot r/o lichen sclerosis Also some vaginal dryness  Today px clobetasol cream  Also ref to gyn for further eval  Inst to call if worse or changes       Relevant Orders   POCT Wet Prep [facility]) (Completed)   Ambulatory referral to Gynecology

## 2023-01-18 NOTE — Patient Instructions (Addendum)
I do not think you have an infection   You may have vaginal dryness but there may also be a condition called lichen sclerosis  I want you to see GYN I will do a referral Give them a call on Monday if you have not heard anything   Try the clobetasol cream as directed for the next 2-4 weeks   Don't use soap on the vaginal area    Switch to dove soap for sensitive skin  Make sure any detergent has the word free in it  Stop all fabric softeners  If symptoms suddenly worsen or do not improve in the meantime let me know

## 2023-01-20 NOTE — Assessment & Plan Note (Addendum)
Seen in UC on 2/13 with neg wet prep- notes and wet prep result reviewed today Was tx with diflucan for poss yeast (before result returned)  Vaginal discomfort /outside and small area inside w/o discharge or other symptoms and is not sexually active  Today wet prep is normal  Pt has small area of pale skin on R labia minora/ cannot r/o lichen sclerosis Also some vaginal dryness  Today px clobetasol cream  Also ref to gyn for further eval  Inst to call if worse or changes

## 2023-01-21 ENCOUNTER — Encounter: Payer: Self-pay | Admitting: *Deleted

## 2023-01-25 DIAGNOSIS — N952 Postmenopausal atrophic vaginitis: Secondary | ICD-10-CM | POA: Diagnosis not present

## 2023-01-25 DIAGNOSIS — M7918 Myalgia, other site: Secondary | ICD-10-CM | POA: Diagnosis not present

## 2023-02-26 DIAGNOSIS — M810 Age-related osteoporosis without current pathological fracture: Secondary | ICD-10-CM | POA: Diagnosis not present

## 2023-02-26 DIAGNOSIS — E039 Hypothyroidism, unspecified: Secondary | ICD-10-CM | POA: Diagnosis not present

## 2023-03-13 ENCOUNTER — Other Ambulatory Visit: Payer: Self-pay | Admitting: Family Medicine

## 2023-03-13 NOTE — Telephone Encounter (Signed)
Last filled on 09/22/21 #180 caps with 2 refills, CPE scheduled on 07/08/23

## 2023-03-28 ENCOUNTER — Encounter: Payer: Self-pay | Admitting: Physical Therapy

## 2023-03-28 ENCOUNTER — Ambulatory Visit: Payer: PPO | Attending: Obstetrics and Gynecology | Admitting: Physical Therapy

## 2023-03-28 DIAGNOSIS — R278 Other lack of coordination: Secondary | ICD-10-CM | POA: Diagnosis not present

## 2023-03-28 DIAGNOSIS — M533 Sacrococcygeal disorders, not elsewhere classified: Secondary | ICD-10-CM | POA: Diagnosis not present

## 2023-03-28 DIAGNOSIS — R2689 Other abnormalities of gait and mobility: Secondary | ICD-10-CM | POA: Diagnosis not present

## 2023-03-28 NOTE — Patient Instructions (Addendum)
Improve water intake for bladder and bowel health   From 16 fl oz of water with flavor packets to consistently 48 fl oz a day without flavor packets, room temperature   Decrease 24 fl oz coffee to 16 fl oz   30 fl oz sweet tea to small 12 fl oz   ___    Sitting with uncrossed ankles   Sitting with 4 points contact: feet and sitting bones

## 2023-03-28 NOTE — Therapy (Signed)
OUTPATIENT PHYSICAL THERAPY EVALUATION / Discharge Summary   Patient Name: Gabrielle Webb MRN: [id number] DOB:[birth year], 82 y.o., female Today's Date: 03/28/2023   PT End of Session - 03/28/23 0811     Visit Number 1    Number of Visits 10    Date for PT Re-Evaluation 06/06/23    PT Start Time 0807    PT Stop Time 0845    PT Time Calculation (min) 38 min    Activity Tolerance Patient tolerated treatment well;No increased pain    Behavior During Therapy [facility] for tasks assessed/performed             Past Medical History:  Diagnosis Date   Allergy    Anxiety    Breast cancer 7/12   Right   Cellulitis    Diverticulosis of colon    GERD (gastroesophageal reflux disease)    Headache    sinus   History of hiatal hernia    Hx of radiation therapy 06/20/11 - 07/11/11   right breast   Hypothyroid    Osteoporosis    Personal history of radiation therapy 2012   Right Breast Cancer   PONV (postoperative nausea and vomiting)    Skin cancer    basal and squamous cell   Vertigo    Past Surgical History:  Procedure Laterality Date   ABDOMINAL HYSTERECTOMY N/A 11/08/2016   Procedure: HYSTERECTOMY TOTAL  ABDOMINAL;  Surgeon: Marcelle Overlie, MD;  Location: [facility];  Service: Gynecology;  Laterality: N/A;   APPENDECTOMY  07/2007   back sugery  1970   BREAST BIOPSY Left    Benign   BREAST EXCISIONAL BIOPSY Left 1983   BREAST EXCISIONAL BIOPSY Left 1983   BREAST LUMPECTOMY Right 05/16/2011   COLONOSCOPY     COLONOSCOPY WITH PROPOFOL N/A 08/27/2018   Procedure: COLONOSCOPY WITH PROPOFOL;  Surgeon: Scot Jun, MD;  Location: [facility] ENDOSCOPY;  Service: Endoscopy;  Laterality: N/A;   COLONOSCOPY WITH PROPOFOL N/A 08/16/2021   Procedure: COLONOSCOPY WITH PROPOFOL;  Surgeon: Earline Mayotte, MD;  Location: [facility] ENDOSCOPY;  Service: Endoscopy;  Laterality: N/A;   DIAGNOSTIC LAPAROSCOPY     DILATION AND CURETTAGE OF UTERUS N/A 05/31/2014   Procedure: DILATATION AND CURETTAGE  WITH ULTRASOUND GUIDANCE;  Surgeon: Jeani Hawking, MD;  Location: [facility];  Service: Gynecology;  Laterality: N/A;   ESOPHAGOGASTRODUODENOSCOPY  04/20/2003   KNEE SURGERY  1962   MASTECTOMY     right eye surgery     SALPINGOOPHORECTOMY Bilateral 11/08/2016   Procedure: BILATERAL SALPINGO OOPHORECTOMY;  Surgeon: Marcelle Overlie, MD;  Location: [facility];  Service: Gynecology;  Laterality: Bilateral;   UPPER GI ENDOSCOPY     Patient Active Problem List   Diagnosis Date Noted   Vaginal pain 01/18/2023   Grief reaction 07/04/2022   Encounter for screening mammogram for breast cancer 12/18/2021   Anxious mood as adjustment reaction 06/09/2021   Breast lump on right side at 8 o'clock position 05/03/2020   History of breast cancer 05/03/2020   Medicare annual wellness visit, subsequent 05/28/2018   Estrogen deficiency 05/28/2018   Fatigue 05/22/2017   Chest discomfort 05/22/2017   S/P TAH-BSO 11/08/2016   Routine general medical examination at a health care facility 05/06/2016   Malignant neoplasm of upper-outer quadrant of right breast in female, estrogen receptor positive 03/21/2015   History of tamoxifen therapy 05/18/2014   Encounter for routine gynecological examination 05/06/2013   Colon cancer screening 05/06/2013   Encounter for Medicare annual wellness exam 04/28/2013  Hx of radiation therapy    Hx of Breast cancer, stage 1, Right, UOQ, Receptor+,Her2- 05/29/2011    Class: Stage 1   Other screening mammogram 03/21/2011   Post-menopausal 03/21/2011   Hypothyroidism 01/23/2008   Pure hypercholesterolemia 01/23/2008   ALLERGIC RHINITIS 01/23/2008   GERD 01/23/2008   DIVERTICULOSIS, COLON 01/23/2008   IBS 01/23/2008   FIBROCYSTIC BREAST DISEASE 01/23/2008   Osteoporosis 10/17/2007    PCP: Roxy Manns, MD  REFERRING PROVIDER: Marcelle Overlie, MD   REFERRING DIAG:  Myalgia of pelvic floor  Rationale for Evaluation and Treatment Rehabilitation  THERAPY DIAG:   Sacrococcygeal disorders, not elsewhere classified  Other abnormalities of gait and mobility  Other lack of coordination  ONSET DATE: Feb 2024  SUBJECTIVE:                                                                                                                                                                                           SUBJECTIVE STATEMENT: 1) tailbone pain -It started out like she had a yeast infection. Pt saw MD . Pt told MD about her tailbone hurt when sitting. Pain is at the lower buttock B. 5/10 pain without radiating pain.  Currently, 2-3/10. Pt found a pillow to sit on which has decreased pain.  Without pillow, pain comes on immediately   2) Bowel movements with straining when she does not take Miralax. Stool consistency Type 3 without Miralax.  Daily fluid intake: 16-32 fl oz of water with flavor packets, 24 fl oz coffee, 30 fl oz sweet tea.     PERTINENT HISTORY: Skin cancer, Breast CA with Radiation/ tamoxifen therapy, and R mastectomy, Appendectomy, Abdominal hysterectomy, back surgery for a ruptured disc to R hip , Knee surgery, osteoporosis   PAIN:  Are you having pain? See above  PRECAUTIONS: None  WEIGHT BEARING RESTRICTIONS: No  FALLS:  Has patient fallen in last 6 months? No  LIVING ENVIRONMENT: Lives with: lives alone Lives in: House/apartment  ( elderly community neighborhood)  Stairs: one floor, no STE    OCCUPATION: widow, enjoy walking 1 mile without a stretching routine, enjoys reading bible, playing with Air traffic controller.,   PLOF: Independent  PATIENT GOALS:   Rid of pain when sitting     OBJECTIVE:       [facility] PT Assessment       Squat   Comments poor alignment and technique    Posture: crossed ankle   Squat: poor alignment and technique   Strength:R hip abd 3/5, L 3+/5, B hip ext limited AROM, B hip / knee flexion/ ext 4/5   SI assessment:L iliac crest higher, shoulder  levelled,  Palpation: L PSIS more posterior/  hypombile, limited in hip extensions B , tenderness at tailbone, limited nutation of sacrum             [facility] Adult PT Treatment/Exercise       Ambulation/Gait   Gait Comments 1.29 m/s, excessive sway of pelvic to L,      Therapeutic Activites    Therapeutic Activities Other Therapeutic Activities    Other Therapeutic Activities explained findings from assessment and how Pelvic PT Tx will address Sx and deficits, active listening to her concern about her protective behavior to her back since her surgery , to her Sx improving with pillow to sit on, to her deferring PT  Provided explanation on increasing water intake to improve bladder and bowels. Explained deficits related to hysterectomy/ back surgeries Hx. Explained role of posture. Cued for body mechanics to minimize straining pelvic floor related to her Sx.               HOME EXERCISE PROGRAM: See pt instruction section    ASSESSMENT:  CLINICAL IMPRESSION:    Pt is a 82  yo  who presents with Tailbone pain,  Bowel movements with straining when she does not take Miralax which impact QOL, ADL,, and community activities.   Pt's musculoskeletal assessment revealed uneven pelvic girdle and shoulder height, asymmetries to gait pattern, limited spinal /pelvic mobility, dyscoordination and strength of pelvic floor mm, hip weakness, limited SIJ mobility and hip extensions ROM, poor body mechanics which places strain on the abdominal/pelvic floor mm.   Pt was provided education on etiology of Sx with anatomy, physiology explanation with images along with the benefits of customized pelvic PT Tx based on pt's medical conditions and musculoskeletal deficits.  Explained the physiology of deep core mm coordination and roles of pelvic floor function in urination, defecation, sexual function, and postural control with deep core mm system.   No Tx was performed due to pt's concern for exacerbation of pain and Sx. Provided education,  answered pt's questions. Provided explanation on increasing water intake to improve bladder and bowels. Explained deficits related to hysterectomy/ back surgeries Hx. Explained role of posture. Cued for body mechanics to minimize straining pelvic floor related to her Sx. Explained findings from assessment and how Pelvic PT Tx will address Sx and deficits.  Pt explained she would like to defer PT because she is feeling better with sitting on pillow and  her protective behavior to her back since her surgery. Pt self-d/c.   OBJECTIVE IMPAIRMENTS decreased activity tolerance, decreased coordination, decreased endurance, decreased mobility, difficulty walking, decreased ROM, decreased strength, decreased safety awareness, hypomobility, increased muscle spasms, impaired flexibility, improper body mechanics, postural dysfunction, andpain. scar restrictions   ACTIVITY LIMITATIONS  self-care,  sleep, home chores,   PARTICIPATION LIMITATIONS:  community   PERSONAL FACTORS   see above "Pertinent factors"    are also affecting patient's functional outcome.    REHAB POTENTIAL: Good   CLINICAL DECISION MAKING: Evolving/moderate complexity   EVALUATION COMPLEXITY: Moderate    PATIENT EDUCATION:    Education details: Showed pt anatomy images. Explained muscles attachments/ connection, physiology of deep core system/ spinal- thoracic-pelvis-lower kinetic chain as they relate to pt's presentation, Sx, and past Hx. Explained what and how these areas of deficits need to be restored to balance and function    See Therapeutic activity / neuromuscular re-education section  Answered pt's questions.   Person educated: Patient Education method: Explanation, Demonstration, Tactile cues, Verbal cues, and  Handouts Education comprehension: verbalized understanding, returned demonstration, verbal cues required, tactile cues required, and needs further education     PLAN: PT FREQUENCY: 1 visit   PT DURATION: 1  visit   PLANNED INTERVENTIONS: Therapeutic exercises, Therapeutic activity, Neuromuscular re-education, Balance training, Gait training, Patient/Family education, Self Care, Joint mobilization, Spinal mobilization, Moist heat, Taping, and Manual therapy, dry needling.   PLAN FOR NEXT SESSION: pt self-d/c     GOALS: Goals reviewed with patient? Yes  SHORT TERM GOALS: Target date: 03/29/23   Pt will demo IND with HEP                    Baseline: Not IND            Goal status: MET    LONG TERM GOALS: Target date: 03/29/23  1.Pt will demo proper body mechanics in sitting posture to minimize straining pelvic floor / back                  Baseline: not IND, improper form that places strain on pelvic floor                Goal status: MET     2. Pt will be educated on bladder and bowel health by increasing water intake and decreasing tea and coffee ( Decrease 24 fl oz coffee to 16 fl oz, 30 fl oz sweet tea to small 12 fl oz )  Baseline: From 16 fl oz of water with flavor packets to consistently 48 fl oz a day without flavor packets, room temperature   Goal status: MET       Mariane Masters, PT 03/28/2023, 8:17 AM

## 2023-04-04 ENCOUNTER — Encounter: Payer: PPO | Admitting: Physical Therapy

## 2023-04-11 ENCOUNTER — Encounter: Payer: PPO | Admitting: Physical Therapy

## 2023-04-18 ENCOUNTER — Encounter: Payer: PPO | Admitting: Physical Therapy

## 2023-04-22 ENCOUNTER — Telehealth: Payer: Self-pay | Admitting: Physical Therapy

## 2023-04-22 NOTE — Telephone Encounter (Signed)
Physical therapist returned pt's request to call her. Answered pt's report of L knee pain. Explained to pt about physical therapy evaluation showed misalignment of pelvis and spine which need to be addressed for improving tailbone pain which she was referred for and to improve knee pain. Explained modifications to Tx will be provided to minimize pain.  Explained when she explained pain with hip extensions assessment, it showed limited range of motion and Tx will always be modified to minimize pain not increase pain. Pt voiced it had scared her when she experienced pain during that assessment and now she demonstrates understanding.   Provided pt referral to get consult with Dr. Landry Mellow at [facility] ( orthopedist doctor) to answer her medical questions related to her concerns about her knee. Explained to pt if she wanted to undergo PT, she will need a new order because she declined further PT after her evaluation. Explained therapist's waiting list is 2 months out and will try to work her sooner based on cancellations if she decides to start with PT.

## 2023-04-23 ENCOUNTER — Telehealth: Payer: Self-pay

## 2023-04-23 NOTE — Telephone Encounter (Signed)
Patient called wanted to know if we can set up for Prolia now in July due to her schedule. Please run benefits and we will call and set up.

## 2023-04-24 ENCOUNTER — Telehealth: Payer: Self-pay | Admitting: Family Medicine

## 2023-04-24 ENCOUNTER — Ambulatory Visit (INDEPENDENT_AMBULATORY_CARE_PROVIDER_SITE_OTHER): Payer: PPO | Admitting: Family Medicine

## 2023-04-24 ENCOUNTER — Encounter: Payer: Self-pay | Admitting: Family Medicine

## 2023-04-24 ENCOUNTER — Ambulatory Visit (INDEPENDENT_AMBULATORY_CARE_PROVIDER_SITE_OTHER)
Admission: RE | Admit: 2023-04-24 | Discharge: 2023-04-24 | Disposition: A | Discharge status: Home / Self Care | Payer: PPO | Source: Ambulatory Visit | Attending: Family Medicine | Admitting: Family Medicine

## 2023-04-24 VITALS — BP 118/72 | HR 80 | Temp 97.8°F | Ht 63.0 in | Wt 134.5 lb

## 2023-04-24 DIAGNOSIS — M25561 Pain in right knee: Secondary | ICD-10-CM | POA: Insufficient documentation

## 2023-04-24 NOTE — Progress Notes (Signed)
Subjective:    Patient ID: Gabrielle Webb, female    DOB: 05/29/1941, 82 y.o.   MRN: [id number]  HPI Pt presents with R knee pain   Wt Readings from Last 3 Encounters:  04/24/23 134 lb 8 oz (61 kg)  01/18/23 131 lb 2 oz (59.5 kg)  07/25/22 134 lb 9.6 oz (61.1 kg)   23.83 kg/m  Vitals:   04/24/23 0820  BP: 118/72  Pulse: 80  Temp: 97.8 F (36.6 C)  SpO2: 100%     Saw PT (pelvic floor/tailbone pain)-was sent by gyn  Noted mal alignment of pelvis and spine - causing tailbone pain This pain started when she did a particular stretch  On stomach and and flexed knee   Pain is medial  Can radiate anteriorly  Sharp at times when walking and it " gives way"  No pain at all unless moving  Not throbbing  Not keeping her up at night     Wearing a compression sleeve - during the day  Used ice -not helpful Has not used heat   Thinks it is swollen  Feels tight when bending   Hurts more to bend than straighten  Pivoting makes it go out on her    Past knee surg R knee- injury  Injury - ?   Over the counter Tylenol  Voltaren gel   Xray today DG Knee 4 Views W/Patella Right  Result Date: 04/24/2023 CLINICAL DATA:  Right knee pain. EXAM: RIGHT KNEE - COMPLETE 4+ VIEW COMPARISON:  None Available. FINDINGS: No acute fracture or dislocation. Small joint effusion. Moderate to severe lateral and mild medial compartment joint space narrowing. Patellofemoral joint space is relatively preserved. Small tricompartmental marginal osteophytes. Osteopenia. Chondrocalcinosis of the menisci. Soft tissues are unremarkable. Single AP view of the left knee is unremarkable, other than chondrocalcinosis of the menisci. IMPRESSION: 1. Tricompartmental osteoarthritis, moderate to severe in the lateral compartment. Electronically Signed   By: Obie Dredge M.D.   On: 04/24/2023 09:41    Patient Active Problem List   Diagnosis Date Noted   Right knee pain 04/24/2023   Vaginal pain  01/18/2023   Grief reaction 07/04/2022   Encounter for screening mammogram for breast cancer 12/18/2021   Anxious mood as adjustment reaction 06/09/2021   Breast lump on right side at 8 o'clock position 05/03/2020   History of breast cancer 05/03/2020   Medicare annual wellness visit, subsequent 05/28/2018   Estrogen deficiency 05/28/2018   Fatigue 05/22/2017   Chest discomfort 05/22/2017   S/P TAH-BSO 11/08/2016   Routine general medical examination at a health care facility 05/06/2016   Malignant neoplasm of upper-outer quadrant of right breast in female, estrogen receptor positive (HCC) 03/21/2015   History of tamoxifen therapy 05/18/2014   Encounter for routine gynecological examination 05/06/2013   Colon cancer screening 05/06/2013   Encounter for Medicare annual wellness exam 04/28/2013   Hx of radiation therapy    Hx of Breast cancer, stage 1, Right, UOQ, Receptor+,Her2- 05/29/2011    Class: Stage 1   Other screening mammogram 03/21/2011   Post-menopausal 03/21/2011   Hypothyroidism 01/23/2008   Pure hypercholesterolemia 01/23/2008   ALLERGIC RHINITIS 01/23/2008   GERD 01/23/2008   DIVERTICULOSIS, COLON 01/23/2008   IBS 01/23/2008   FIBROCYSTIC BREAST DISEASE 01/23/2008   Osteoporosis 10/17/2007   Past Medical History:  Diagnosis Date   Allergy    Anxiety    Breast cancer (HCC) 7/12   Right   Cellulitis    Diverticulosis  of colon    GERD (gastroesophageal reflux disease)    Headache    sinus   History of hiatal hernia    Hx of radiation therapy 06/20/11 - 07/11/11   right breast   Hypothyroid    Osteoporosis    Personal history of radiation therapy 2012   Right Breast Cancer   PONV (postoperative nausea and vomiting)    Skin cancer    basal and squamous cell   Vertigo    Past Surgical History:  Procedure Laterality Date   ABDOMINAL HYSTERECTOMY N/A 11/08/2016   Procedure: HYSTERECTOMY TOTAL  ABDOMINAL;  Surgeon: Marcelle Overlie, MD;  Location: [facility];   Service: Gynecology;  Laterality: N/A;   APPENDECTOMY  07/2007   back sugery  1970   BREAST BIOPSY Left    Benign   BREAST EXCISIONAL BIOPSY Left 1983   BREAST EXCISIONAL BIOPSY Left 1983   BREAST LUMPECTOMY Right 05/16/2011   COLONOSCOPY     COLONOSCOPY WITH PROPOFOL N/A 08/27/2018   Procedure: COLONOSCOPY WITH PROPOFOL;  Surgeon: Scot Jun, MD;  Location: [facility] ENDOSCOPY;  Service: Endoscopy;  Laterality: N/A;   COLONOSCOPY WITH PROPOFOL N/A 08/16/2021   Procedure: COLONOSCOPY WITH PROPOFOL;  Surgeon: Earline Mayotte, MD;  Location: [facility] ENDOSCOPY;  Service: Endoscopy;  Laterality: N/A;   DIAGNOSTIC LAPAROSCOPY     DILATION AND CURETTAGE OF UTERUS N/A 05/31/2014   Procedure: DILATATION AND CURETTAGE WITH ULTRASOUND GUIDANCE;  Surgeon: Jeani Hawking, MD;  Location: [facility];  Service: Gynecology;  Laterality: N/A;   ESOPHAGOGASTRODUODENOSCOPY  04/20/2003   KNEE SURGERY  1962   MASTECTOMY     right eye surgery     SALPINGOOPHORECTOMY Bilateral 11/08/2016   Procedure: BILATERAL SALPINGO OOPHORECTOMY;  Surgeon: Marcelle Overlie, MD;  Location: [facility];  Service: Gynecology;  Laterality: Bilateral;   UPPER GI ENDOSCOPY     Social History   Tobacco Use   Smoking status: Never   Smokeless tobacco: Never  Vaping Use   Vaping Use: Never used  Substance Use Topics   Alcohol use: No    Alcohol/week: 0.0 standard drinks of alcohol   Drug use: No   Family History  Problem Relation Age of Onset   Atrial fibrillation Mother    Coronary artery disease Mother    Congenital heart disease Mother    Heart attack Father 67   Hypertension Father    Diabetes Maternal Aunt    Cancer Maternal Aunt        leukemia   Diabetes Maternal Uncle    Cancer Maternal Uncle        colon   Diabetes Other        Grandmother   Coronary artery disease Other        Grandmother   Uterine cancer Other        Grandmother   Leukemia Other        Aunt   Allergies  Allergen Reactions    Codeine Nausea And Vomiting   Fentanyl Nausea And Vomiting   Hydromorphone Nausea And Vomiting        Morphine Nausea And Vomiting   Ciprofloxacin     Intolerant- tingling    Tape     Surgical tape   Prednisone Palpitations    Rapid hear beat   Current Outpatient Medications on File Prior to Visit  Medication Sig Dispense Refill   atorvastatin (LIPITOR) 10 MG tablet Take 1 tablet (10 mg total) by mouth daily. 90 tablet 3   BIOTIN  PO Take by mouth daily.     CALCIUM PO Take 1,500 mg by mouth daily.     carboxymethylcellulose (REFRESH PLUS) 0.5 % SOLN 1 drop 3 (three) times daily as needed.     chlorthalidone (HYGROTON) 25 MG tablet TAKE TWO TABLETS EVERY DAY AS NEEDED FORSWELLING 180 tablet 0   Cholecalciferol (VITAMIN D) 2000 UNITS CAPS Take 1 capsule by mouth daily.     clobetasol cream (TEMOVATE) 0.05 % Apply 1 Application topically daily. Apply topically to affected areas daily 30 g 0   Cyanocobalamin (B-12 PO) Take 2,500 mcg by mouth daily.     denosumab (PROLIA) 60 MG/ML SOSY injection Inject 60 mg into the skin every 6 (six) months. 1 mL 0   diclofenac sodium (VOLTAREN) 1 % GEL Apply 2 g topically 4 (four) times daily. Rub into affected area of foot 2 to 4 times daily (Patient taking differently: Apply 2 g topically as needed. Rub into affected area of foot 2 to 4 times daily) 100 g 2   dicyclomine (BENTYL) 10 MG capsule TAKE 1 CAPSULE BY MOUTH TWICE DAILY FOR SPASMS 180 capsule 2   escitalopram (LEXAPRO) 5 MG tablet Take 1 tablet (5 mg total) by mouth daily. 90 tablet 3   famotidine (PEPCID) 20 MG tablet TAKE ONE TABLET EVERY DAY AS NEEDED FOR HEARTBURN OR INDIGESTION 90 tablet 3   Flaxseed, Linseed, (FLAX SEED OIL PO) Take by mouth. 2 a day     fluticasone (FLONASE) 50 MCG/ACT nasal spray SPRAY TWICE INTO EACH NOSTRIL ONCE DAILY 18 g 2   levothyroxine (SYNTHROID) 75 MCG tablet TAKE ONE TABLET BY MOUTH DAILY BEFORE BREAKFAST 90 tablet 3   Multiple Vitamin (MULTIVITAMIN) capsule  Take 1 capsule by mouth daily.     Naproxen Sodium (ALEVE PO) Take 1 tablet by mouth as needed.     Phenylephrine-APAP-guaiFENesin (TYLENOL SINUS SEVERE PO) Take by mouth.     Propylene Glycol (SYSTANE COMPLETE OP) Apply to eye.     No current facility-administered medications on file prior to visit.     Review of Systems  Constitutional:  Negative for activity change, appetite change, fatigue, fever and unexpected weight change.  HENT:  Negative for congestion, ear pain, rhinorrhea, sinus pressure and sore throat.   Eyes:  Negative for pain, redness and visual disturbance.  Respiratory:  Negative for cough, shortness of breath and wheezing.   Cardiovascular:  Negative for chest pain and palpitations.  Gastrointestinal:  Negative for abdominal pain, blood in stool, constipation and diarrhea.  Endocrine: Negative for polydipsia and polyuria.  Genitourinary:  Negative for dysuria, frequency and urgency.  Musculoskeletal:  Positive for arthralgias. Negative for back pain and myalgias.  Skin:  Negative for pallor and rash.  Allergic/Immunologic: Negative for environmental allergies.  Neurological:  Negative for dizziness, syncope and headaches.  Hematological:  Negative for adenopathy. Does not bruise/bleed easily.  Psychiatric/Behavioral:  Negative for decreased concentration and dysphoric mood. The patient is not nervous/anxious.        Objective:   Physical Exam Constitutional:      General: She is not in acute distress.    Appearance: Normal appearance. She is normal weight. She is not ill-appearing.  Cardiovascular:     Rate and Rhythm: Normal rate and regular rhythm.  Pulmonary:     Effort: Pulmonary effort is normal. No respiratory distress.     Breath sounds: Normal breath sounds. No wheezing or rales.  Musculoskeletal:     Comments: Knee right  No  obvious effusion/ slight swelling (scar present makes it hard to tell) No warmth to the touch  No crepitus  ROM:  Flex-pain  at endpoint  Ext  normal  Mcmurray- very slight discomfort Bounce test normal   Stability: Anterior drawer-normal  Lachman exam -normal   Tenderness - very mild tenderness over medial joint line   Gait : pt is apprehensive to take steps      Skin:    Coloration: Skin is not pale.     Findings: No erythema.  Neurological:     Mental Status: She is alert.     Sensory: No sensory deficit.     Motor: No weakness.     Coordination: Coordination normal.     Deep Tendon Reflexes: Reflexes normal.  Psychiatric:        Mood and Affect: Mood normal.           Assessment & Plan:   Problem List Items Addressed This Visit       Other   Right knee pain - Primary    Past h/o surgery for injury on this knee in past (? What it was)  Recently started pain after knee was flexed during PT for pelvic issue  Now she experiences feeling of sharp pain (fleeting) and knee going out on her at times when walking In between it is fine  May have some swelling/ hard to tell due to scar  Xray today notes tri compartmental degenerative changes  Recommend ice/ voltaren gel  Compression if helpful  Referral to orthopedics for eval and treat        Relevant Orders   DG Knee 4 Views W/Patella Right (Completed)   Ambulatory referral to Orthopedic Surgery

## 2023-04-24 NOTE — Telephone Encounter (Signed)
Patient called in referring to the referral to orthopedics.She would like to know if her treatments that she is going to have at the orthopedic is going to require someone to drive her,or do Dr Milinda Antis thinks that she will be able to drive herself.She is trying to see which location would good for her.

## 2023-04-24 NOTE — Telephone Encounter (Signed)
I put the referral in  Please let us know if you don't hear in 1-2 weeks   

## 2023-04-24 NOTE — Telephone Encounter (Signed)
Patient contacted the office following up on this message, states she would like to be referred to an office in [place]. Please advise, thank you.

## 2023-04-24 NOTE — Patient Instructions (Addendum)
Knee xray today  Keep trying some ice for 10 minutes at a time  Continue the voltaren gel   If the compression sleeve helps- keep wearing it    We will make a plan based on results

## 2023-04-24 NOTE — Telephone Encounter (Signed)
Left message to return call to our office.  

## 2023-04-24 NOTE — Assessment & Plan Note (Addendum)
Past h/o surgery for injury on this knee in past (? What it was)  Recently started pain after knee was flexed during PT for pelvic issue  Now she experiences feeling of sharp pain (fleeting) and knee going out on her at times when walking In between it is fine  May have some swelling/ hard to tell due to scar  Xray today notes tri compartmental degenerative changes  Recommend ice/ voltaren gel  Compression if helpful  Referral to orthopedics for eval and treat

## 2023-04-24 NOTE — Telephone Encounter (Signed)
Patient contacted the office regarding this and would like to specify that she would like to be referred to [facility] in [place]. Please advise, thank you.

## 2023-04-24 NOTE — Telephone Encounter (Signed)
Patient returned call to our office, relayed Dr. Royden Purl message from below. Patient expressed understanding.

## 2023-04-25 ENCOUNTER — Encounter: Payer: PPO | Admitting: Physical Therapy

## 2023-04-30 ENCOUNTER — Telehealth: Payer: Self-pay | Admitting: Family Medicine

## 2023-04-30 NOTE — Telephone Encounter (Signed)
Noted, will evaluate as scheduled.  

## 2023-04-30 NOTE — Telephone Encounter (Signed)
Called and spoke to patient states that she has boil in left side of vagina. States that it started about 3 days ago. Is feels like a boil and has a head to it. Denies any discharge from area, fever, nausea or vomiting. States it is very tender to touch. Tried to get patient set up for today but did not have anything open. She did not want to come in on 5/27. She has plans that she can't change. I have set her up with appointment on Thursday. If any changes in symptoms or red words that I have reviewed she will get seen sooner or go to ED. Sending to pcp and Mayra Reel (she has been set up to see Chestine Spore on 5/27)

## 2023-04-30 NOTE — Telephone Encounter (Signed)
Pt called requesting a call back from [place] to discuss a personal issue. Pt states she cannot sit down due to the issue she's been having in her vaginal area. Call back # [phone]

## 2023-05-01 ENCOUNTER — Telehealth: Payer: Self-pay

## 2023-05-01 ENCOUNTER — Other Ambulatory Visit (HOSPITAL_COMMUNITY): Payer: Self-pay

## 2023-05-01 NOTE — Telephone Encounter (Signed)
Created new encounter for Prolia BIV. Will route encounter back once benefit verification is complete.  

## 2023-05-01 NOTE — Telephone Encounter (Signed)
Prolia VOB initiated via [email or web address]   Last Prolia inj: 12/05/22 Next Prolia inj DUE: 06/04/23

## 2023-05-02 ENCOUNTER — Encounter: Payer: Self-pay | Admitting: *Deleted

## 2023-05-02 ENCOUNTER — Ambulatory Visit (INDEPENDENT_AMBULATORY_CARE_PROVIDER_SITE_OTHER): Payer: PPO | Admitting: Primary Care

## 2023-05-02 ENCOUNTER — Encounter: Payer: Self-pay | Admitting: Primary Care

## 2023-05-02 ENCOUNTER — Other Ambulatory Visit: Payer: Self-pay | Admitting: Family Medicine

## 2023-05-02 ENCOUNTER — Encounter: Payer: PPO | Admitting: Physical Therapy

## 2023-05-02 VITALS — BP 120/76 | HR 73 | Temp 98.7°F | Ht 63.0 in | Wt 137.0 lb

## 2023-05-02 DIAGNOSIS — L989 Disorder of the skin and subcutaneous tissue, unspecified: Secondary | ICD-10-CM | POA: Diagnosis not present

## 2023-05-02 NOTE — Patient Instructions (Addendum)
Keep the site dry and protected.   Please contact us if you develop increased pain, swelling, drainage to the site.  It was a pleasure meeting you!

## 2023-05-02 NOTE — Telephone Encounter (Signed)
I have already sent both referral to [facility] and contacted the patient with the referral information via mychart.

## 2023-05-02 NOTE — Telephone Encounter (Signed)
I dont see a recent referral for PT, only the referral that was placed back in February which is closed.  There is only an open Ortho Referral to [facility]  Was the patient seeing Ortho first then letting them determine the need for PT or were you referring to PT as well?  If PT is needed please place a new referral for PT  Thanks!

## 2023-05-02 NOTE — Assessment & Plan Note (Signed)
Improving. Mostly represents an acne vulgaris lesion that could have occurred with sweating/moisture.  Healing.  No evidence of infection or abscess.  Reassurance provided.  Discussed to keep area dry. Protection against recurrent sweating.  Follow up as needed.

## 2023-05-02 NOTE — Telephone Encounter (Signed)
Patient contacted the office regarding referral for physical therapy. States she wanted it sent to [facility] in [place], patient says she visited the office herself to see if they had received the referral and was told they had not. Patient is asking if the referral can be re faxed to [facility] in [place], please fax to [phone]

## 2023-05-02 NOTE — Telephone Encounter (Signed)
I put the referral in for PT as well Thanks

## 2023-05-02 NOTE — Progress Notes (Signed)
Subjective:    Patient ID: Gabrielle Webb, female    DOB: 1940/12/09, 82 y.o.   MRN: [id number]  HPI  Gabrielle Webb is a very pleasant 82 y.o. female patient of Dr. Milinda Antis with a history of breast cancer, vaginal pain, hypothyroidism, osteoporosis who presents today to discuss skin sore.  Symptom onset to right perineum skin sore that she first noticed 3-4 days ago.   She began using warm compresses two days ago and the skin sore came to a head and popped yesterday. She's not really noticed any drainage or reduction in the size of the skin sore.   She is also needing to get back in with pelvic floor PT for which she had to put on hold. She's not sure if her pain is from her chronic pelvic pain with sitting or from her sore.   She denies fevers, chills. She's not applied anything topical for her symptoms. She's never experienced anything like this in the past. She does wear tight jeans.   Review of Systems  Constitutional:  Negative for fever.  Skin:  Positive for wound. Negative for rash.         Past Medical History:  Diagnosis Date   Allergy    Anxiety    Breast cancer (HCC) 7/12   Right   Cellulitis    Diverticulosis of colon    GERD (gastroesophageal reflux disease)    Headache    sinus   History of hiatal hernia    Hx of radiation therapy 06/20/11 - 07/11/11   right breast   Hypothyroid    Osteoporosis    Personal history of radiation therapy 2012   Right Breast Cancer   PONV (postoperative nausea and vomiting)    Skin cancer    basal and squamous cell   Vertigo     Social History   Socioeconomic History   Marital status: Widowed    Spouse name: Not on file   Number of children: 1   Years of education: Not on file   Highest education level: Not on file  Occupational History   Occupation: insurance agency  Tobacco Use   Smoking status: Never   Smokeless tobacco: Never  Vaping Use   Vaping Use: Never used  Substance and Sexual Activity    Alcohol use: No    Alcohol/week: 0.0 standard drinks of alcohol   Drug use: No   Sexual activity: Not Currently    Birth control/protection: Post-menopausal  Other Topics Concern   Not on file  Social History Narrative   Exercises on [employer] elliptical   Social Determinants of Health   Financial Resource Strain: Low Risk  (09/20/2022)   Overall Financial Resource Strain (CARDIA)    Difficulty of Paying Living Expenses: Not hard at all  Food Insecurity: No Food Insecurity (09/20/2022)   Hunger Vital Sign    Worried About Running Out of Food in the Last Year: Never true    Ran Out of Food in the Last Year: Never true  Transportation Needs: No Transportation Needs (09/20/2022)   PRAPARE - Administrator, Civil Service (Medical): No    Lack of Transportation (Non-Medical): No  Physical Activity: Sufficiently Active (09/20/2022)   Exercise Vital Sign    Days of Exercise per Week: 7 days    Minutes of Exercise per Session: 60 min  Stress: No Stress Concern Present (09/20/2022)   [employer] of Occupational Health - Occupational Stress Questionnaire    Feeling  of Stress : Not at all  Social Connections: Moderately Integrated (09/20/2022)   Social Connection and Isolation Panel [NHANES]    Frequency of Communication with Friends and Family: More than three times a week    Frequency of Social Gatherings with Friends and Family: More than three times a week    Attends Religious Services: More than 4 times per year    Active Member of [employer] or Organizations: Yes    Attends Banker Meetings: 1 to 4 times per year    Marital Status: Widowed  Intimate Partner Violence: Not At Risk (09/20/2022)   Humiliation, Afraid, Rape, and Kick questionnaire    Fear of Current or Ex-Partner: No    Emotionally Abused: No    Physically Abused: No    Sexually Abused: No    Past Surgical History:  Procedure Laterality Date   ABDOMINAL HYSTERECTOMY N/A 11/08/2016    Procedure: HYSTERECTOMY TOTAL  ABDOMINAL;  Surgeon: Marcelle Overlie, MD;  Location: [facility];  Service: Gynecology;  Laterality: N/A;   APPENDECTOMY  07/2007   back sugery  1970   BREAST BIOPSY Left    Benign   BREAST EXCISIONAL BIOPSY Left 1983   BREAST EXCISIONAL BIOPSY Left 1983   BREAST LUMPECTOMY Right 05/16/2011   COLONOSCOPY     COLONOSCOPY WITH PROPOFOL N/A 08/27/2018   Procedure: COLONOSCOPY WITH PROPOFOL;  Surgeon: Scot Jun, MD;  Location: [facility];  Service: Endoscopy;  Laterality: N/A;   COLONOSCOPY WITH PROPOFOL N/A 08/16/2021   Procedure: COLONOSCOPY WITH PROPOFOL;  Surgeon: Earline Mayotte, MD;  Location: [facility] ENDOSCOPY;  Service: Endoscopy;  Laterality: N/A;   DIAGNOSTIC LAPAROSCOPY     DILATION AND CURETTAGE OF UTERUS N/A 05/31/2014   Procedure: DILATATION AND CURETTAGE WITH ULTRASOUND GUIDANCE;  Surgeon: Jeani Hawking, MD;  Location: [facility];  Service: Gynecology;  Laterality: N/A;   ESOPHAGOGASTRODUODENOSCOPY  04/20/2003   KNEE SURGERY  1962   MASTECTOMY     right eye surgery     SALPINGOOPHORECTOMY Bilateral 11/08/2016   Procedure: BILATERAL SALPINGO OOPHORECTOMY;  Surgeon: Marcelle Overlie, MD;  Location: [facility];  Service: Gynecology;  Laterality: Bilateral;   UPPER GI ENDOSCOPY      Family History  Problem Relation Age of Onset   Atrial fibrillation Mother    Coronary artery disease Mother    Congenital heart disease Mother    Heart attack Father 47   Hypertension Father    Diabetes Maternal Aunt    Cancer Maternal Aunt        leukemia   Diabetes Maternal Uncle    Cancer Maternal Uncle        colon   Diabetes Other        Grandmother   Coronary artery disease Other        Grandmother   Uterine cancer Other        Grandmother   Leukemia Other        Aunt    Allergies  Allergen Reactions   Codeine Nausea And Vomiting   Fentanyl Nausea And Vomiting   Hydromorphone Nausea And Vomiting        Morphine Nausea And Vomiting    Ciprofloxacin     Intolerant- tingling    Tape     Surgical tape   Prednisone Palpitations    Rapid hear beat    Current Outpatient Medications on File Prior to Visit  Medication Sig Dispense Refill   atorvastatin (LIPITOR) 10 MG tablet Take 1 tablet (10  mg total) by mouth daily. 90 tablet 3   BIOTIN PO Take by mouth daily.     CALCIUM PO Take 1,500 mg by mouth daily.     carboxymethylcellulose (REFRESH PLUS) 0.5 % SOLN 1 drop 3 (three) times daily as needed.     chlorthalidone (HYGROTON) 25 MG tablet TAKE TWO TABLETS EVERY DAY AS NEEDED FORSWELLING 180 tablet 0   Cholecalciferol (VITAMIN D) 2000 UNITS CAPS Take 1 capsule by mouth daily.     clobetasol cream (TEMOVATE) 0.05 % Apply 1 Application topically daily. Apply topically to affected areas daily 30 g 0   Cyanocobalamin (B-12 PO) Take 2,500 mcg by mouth daily.     denosumab (PROLIA) 60 MG/ML SOSY injection Inject 60 mg into the skin every 6 (six) months. 1 mL 0   diclofenac sodium (VOLTAREN) 1 % GEL Apply 2 g topically 4 (four) times daily. Rub into affected area of foot 2 to 4 times daily (Patient taking differently: Apply 2 g topically as needed. Rub into affected area of foot 2 to 4 times daily) 100 g 2   dicyclomine (BENTYL) 10 MG capsule TAKE 1 CAPSULE BY MOUTH TWICE DAILY FOR SPASMS 180 capsule 2   escitalopram (LEXAPRO) 5 MG tablet Take 1 tablet (5 mg total) by mouth daily. 90 tablet 3   famotidine (PEPCID) 20 MG tablet TAKE ONE TABLET EVERY DAY AS NEEDED FOR HEARTBURN OR INDIGESTION 90 tablet 3   Flaxseed, Linseed, (FLAX SEED OIL PO) Take by mouth. 2 a day     fluticasone (FLONASE) 50 MCG/ACT nasal spray SPRAY TWICE INTO EACH NOSTRIL ONCE DAILY 18 g 2   levothyroxine (SYNTHROID) 75 MCG tablet TAKE ONE TABLET BY MOUTH DAILY BEFORE BREAKFAST 90 tablet 3   Multiple Vitamin (MULTIVITAMIN) capsule Take 1 capsule by mouth daily.     Naproxen Sodium (ALEVE PO) Take 1 tablet by mouth as needed.     Phenylephrine-APAP-guaiFENesin  (TYLENOL SINUS SEVERE PO) Take by mouth.     Propylene Glycol (SYSTANE COMPLETE OP) Apply to eye.     No current facility-administered medications on file prior to visit.    BP 120/76   Pulse 73   Temp 98.7 F (37.1 C) (Temporal)   Ht 5\' 3"  (1.6 m)   Wt 137 lb (62.1 kg)   LMP 12/03/1988   SpO2 98%   BMI 24.27 kg/m  Objective:   Physical Exam Constitutional:      General: She is not in acute distress. Genitourinary:    Labia:        Right: No rash or lesion.        Left: No rash or lesion.        Comments: 0.25 cm rounded, slightly raised, mildly pink skin lesion to right perineum. No surrounding erythema, warmth, swelling. Non tender.  Skin:    General: Skin is warm and dry.           Assessment & Plan:  Skin lesion Assessment & Plan: Improving. Mostly represents an acne vulgaris lesion that could have occurred with sweating/moisture.  Healing.  No evidence of infection or abscess.  Reassurance provided.  Discussed to keep area dry. Protection against recurrent sweating.  Follow up as needed.          Doreene Nest, NP

## 2023-05-02 NOTE — Addendum Note (Signed)
Addended by: Roxy Manns A on: 05/02/2023 04:10 PM   Modules accepted: Orders

## 2023-05-08 NOTE — Telephone Encounter (Signed)
Patient called in to check on status of getting prolia labs and injection scheduled

## 2023-05-09 ENCOUNTER — Encounter: Payer: PPO | Admitting: Physical Therapy

## 2023-05-10 NOTE — Telephone Encounter (Signed)
Please check on auth for patient.

## 2023-05-10 NOTE — Telephone Encounter (Signed)
Tell me what you need me to do  , thanks

## 2023-05-14 ENCOUNTER — Other Ambulatory Visit (HOSPITAL_COMMUNITY): Payer: Self-pay

## 2023-05-14 NOTE — Telephone Encounter (Signed)
   20% coinsurance, no deductible, no prior auth. needed 

## 2023-05-14 NOTE — Telephone Encounter (Signed)
Pt ready for scheduling for PROLIA on or after : 06/04/23  Out-of-pocket cost due at time of visit: $302  Primary: HEALTH TEAM ADVANTAGE  Prolia co-insurance: 20% Admin fee co-insurance: 0%  Secondary: --- Prolia co-insurance:  Admin fee co-insurance:   Medical Benefit Details: Date Benefits were checked: 05/01/23 Deductible: NO/ Coinsurance: 20%/ Admin Fee: 0%  Prior Auth: N/A PA# Expiration Date:    Pharmacy benefit: Copay $200 If patient wants fill through the pharmacy benefit please send prescription to:  [facility] , and include estimated need by date in rx notes. Pharmacy will ship medication directly to the office.  Patient NOT eligible for Prolia Copay Card. Copay Card can make patient's cost as little as $25. Link to apply: [email or web address]://[email or web address]  ** This summary of benefits is an estimation of the patient's out-of-pocket cost. Exact cost may very based on individual plan coverage.

## 2023-05-15 ENCOUNTER — Other Ambulatory Visit (HOSPITAL_COMMUNITY): Payer: Self-pay

## 2023-05-15 ENCOUNTER — Other Ambulatory Visit: Payer: Self-pay

## 2023-05-15 DIAGNOSIS — M818 Other osteoporosis without current pathological fracture: Secondary | ICD-10-CM

## 2023-05-15 MED ORDER — DENOSUMAB 60 MG/ML ~~LOC~~ SOSY
60.0000 mg | PREFILLED_SYRINGE | Freq: Once | SUBCUTANEOUS | 0 refills | Status: AC
  Filled 2023-05-15: qty 1, 1d supply, fill #0
  Filled 2023-05-15: qty 1, 180d supply, fill #0

## 2023-05-15 NOTE — Telephone Encounter (Signed)
Called patient reviewed all following information including appointment, Co pay due at time of visit and if pick up of injection is needed from outside pharmacy.     Out of pocket for patient: $200 to pharmacy  Lab appointment :  05/29/23  Nurse visit:  06/05/23  Lab order placed: Yes  Prolia has been  []   Ordered  []   Script sent to local pharmacy for patient to bring   []   Script sent to Specialty pharmacy   [x]   Script sent to [facility] to deliver

## 2023-05-16 ENCOUNTER — Encounter: Payer: PPO | Admitting: Physical Therapy

## 2023-05-17 DIAGNOSIS — M1711 Unilateral primary osteoarthritis, right knee: Secondary | ICD-10-CM | POA: Diagnosis not present

## 2023-05-23 ENCOUNTER — Encounter: Payer: PPO | Admitting: Physical Therapy

## 2023-05-24 DIAGNOSIS — M25561 Pain in right knee: Secondary | ICD-10-CM | POA: Diagnosis not present

## 2023-05-27 ENCOUNTER — Other Ambulatory Visit (HOSPITAL_COMMUNITY): Payer: Self-pay

## 2023-05-27 DIAGNOSIS — M25561 Pain in right knee: Secondary | ICD-10-CM | POA: Diagnosis not present

## 2023-05-28 ENCOUNTER — Other Ambulatory Visit (HOSPITAL_COMMUNITY): Payer: Self-pay

## 2023-05-28 ENCOUNTER — Other Ambulatory Visit: Payer: Self-pay

## 2023-05-29 ENCOUNTER — Other Ambulatory Visit (INDEPENDENT_AMBULATORY_CARE_PROVIDER_SITE_OTHER): Payer: PPO

## 2023-05-29 DIAGNOSIS — M818 Other osteoporosis without current pathological fracture: Secondary | ICD-10-CM

## 2023-05-29 LAB — BASIC METABOLIC PANEL
BUN: 14 mg/dL (ref 6–23)
CO2: 27 mEq/L (ref 19–32)
Calcium: 8.9 mg/dL (ref 8.4–10.5)
Chloride: 107 mEq/L (ref 96–112)
Creatinine, Ser: 0.73 mg/dL (ref 0.40–1.20)
GFR: 77.88 mL/min (ref >60.00)
Glucose, Bld: 83 mg/dL (ref 70–99)
Potassium: 3.5 mEq/L (ref 3.5–5.1)
Sodium: 142 mEq/L (ref 135–145)

## 2023-06-05 ENCOUNTER — Ambulatory Visit (INDEPENDENT_AMBULATORY_CARE_PROVIDER_SITE_OTHER): Payer: PPO

## 2023-06-05 DIAGNOSIS — M818 Other osteoporosis without current pathological fracture: Secondary | ICD-10-CM | POA: Diagnosis not present

## 2023-06-05 MED ORDER — DENOSUMAB 60 MG/ML ~~LOC~~ SOSY
60.0000 mg | PREFILLED_SYRINGE | Freq: Once | SUBCUTANEOUS | Status: AC
  Administered 2023-06-05: 60 mg via SUBCUTANEOUS

## 2023-06-05 NOTE — Progress Notes (Signed)
Per orders of Dr. Marne Tower, injection of prolia 60 mg given by Brinton Brandel in left arm Patient tolerated injection well. Patient will make appointment for 6 month.   

## 2023-06-20 ENCOUNTER — Encounter: Payer: Self-pay | Admitting: Internal Medicine

## 2023-06-20 ENCOUNTER — Telehealth: Payer: PPO | Admitting: Internal Medicine

## 2023-06-20 ENCOUNTER — Telehealth: Payer: Self-pay | Admitting: Family Medicine

## 2023-06-20 DIAGNOSIS — U071 COVID-19: Secondary | ICD-10-CM | POA: Diagnosis not present

## 2023-06-20 MED ORDER — NIRMATRELVIR/RITONAVIR (PAXLOVID)TABLET: 3.0000 | ORAL_TABLET | Freq: Two times a day (BID) | ORAL | 0 refills | Status: AC

## 2023-06-20 NOTE — Telephone Encounter (Signed)
Patient called in and has some questions regarding Covid. She can be reached at [phone]. Thank you!

## 2023-06-20 NOTE — Telephone Encounter (Signed)
Will evaluate at today's visit

## 2023-06-20 NOTE — Assessment & Plan Note (Addendum)
Will try the paxlovid --full dose --since only 2 days in Continue tylenol and robitussin ER evaluation-- if SOB--or more black stools Discussed isolation and masking

## 2023-06-20 NOTE — Progress Notes (Signed)
Subjective:    Patient ID: Gabrielle Webb, female    DOB: May 12, 1941, 82 y.o.   MRN: [id number]  HPI Video virtual visit for COVID infection Identification done Reviewed limitations and billing and she gave consent Participants--patient in her home and I am in my office  Visited cousin ~ 7/14 The next day--she tested positive for COVID She felt bad the next day and was positive on 7/16 Then went to [facility] and they swabbed her--and that was also positive  Aching all over, cough--which causes throat pain Some fever--better with tylenol Diarrhea today---first time (black color) No SOB--oxygen level was fine on phone No dizziness Mostly staying in bed  Current Outpatient Medications on File Prior to Visit  Medication Sig Dispense Refill   atorvastatin (LIPITOR) 10 MG tablet TAKE 1 TABLET BY MOUTH DAILY 90 tablet 0   BIOTIN PO Take by mouth daily.     CALCIUM PO Take 1,500 mg by mouth daily.     carboxymethylcellulose (REFRESH PLUS) 0.5 % SOLN 1 drop 3 (three) times daily as needed.     chlorthalidone (HYGROTON) 25 MG tablet TAKE TWO TABLETS EVERY DAY AS NEEDED FORSWELLING 180 tablet 0   Cholecalciferol (VITAMIN D) 2000 UNITS CAPS Take 1 capsule by mouth daily.     clobetasol cream (TEMOVATE) 0.05 % Apply 1 Application topically daily. Apply topically to affected areas daily 30 g 0   Cyanocobalamin (B-12 PO) Take 2,500 mcg by mouth daily.     denosumab (PROLIA) 60 MG/ML SOSY injection Inject 60 mg into the skin every 6 (six) months. 1 mL 0   diclofenac sodium (VOLTAREN) 1 % GEL Apply 2 g topically 4 (four) times daily. Rub into affected area of foot 2 to 4 times daily (Patient taking differently: Apply 2 g topically as needed. Rub into affected area of foot 2 to 4 times daily) 100 g 2   dicyclomine (BENTYL) 10 MG capsule TAKE 1 CAPSULE BY MOUTH TWICE DAILY FOR SPASMS 180 capsule 2   escitalopram (LEXAPRO) 5 MG tablet Take 1 tablet (5 mg total) by mouth daily. 90 tablet 3    famotidine (PEPCID) 20 MG tablet TAKE ONE TABLET EVERY DAY AS NEEDED FOR HEARTBURN OR INDIGESTION 90 tablet 3   Flaxseed, Linseed, (FLAX SEED OIL PO) Take by mouth. 2 a day     fluticasone (FLONASE) 50 MCG/ACT nasal spray SPRAY TWICE INTO EACH NOSTRIL ONCE DAILY 18 g 2   levothyroxine (SYNTHROID) 75 MCG tablet TAKE ONE TABLET BY MOUTH DAILY BEFORE BREAKFAST 90 tablet 3   Multiple Vitamin (MULTIVITAMIN) capsule Take 1 capsule by mouth daily.     Naproxen Sodium (ALEVE PO) Take 1 tablet by mouth as needed.     Phenylephrine-APAP-guaiFENesin (TYLENOL SINUS SEVERE PO) Take by mouth.     Propylene Glycol (SYSTANE COMPLETE OP) Apply to eye.     No current facility-administered medications on file prior to visit.    Allergies  Allergen Reactions   Codeine Nausea And Vomiting   Fentanyl Nausea And Vomiting   Hydromorphone Nausea And Vomiting        Morphine Nausea And Vomiting   Ciprofloxacin     Intolerant- tingling    Tape     Surgical tape   Prednisone Palpitations    Rapid hear beat    Past Medical History:  Diagnosis Date   Allergy    Anxiety    Breast cancer (HCC) 7/12   Right   Cellulitis    Diverticulosis of  colon    GERD (gastroesophageal reflux disease)    Headache    sinus   History of hiatal hernia    Hx of radiation therapy 06/20/11 - 07/11/11   right breast   Hypothyroid    Osteoporosis    Personal history of radiation therapy 2012   Right Breast Cancer   PONV (postoperative nausea and vomiting)    Skin cancer    basal and squamous cell   Vertigo     Past Surgical History:  Procedure Laterality Date   ABDOMINAL HYSTERECTOMY N/A 11/08/2016   Procedure: HYSTERECTOMY TOTAL  ABDOMINAL;  Surgeon: Marcelle Overlie, MD;  Location: [facility];  Service: Gynecology;  Laterality: N/A;   APPENDECTOMY  07/2007   back sugery  1970   BREAST BIOPSY Left    Benign   BREAST EXCISIONAL BIOPSY Left 1983   BREAST EXCISIONAL BIOPSY Left 1983   BREAST LUMPECTOMY Right 05/16/2011    COLONOSCOPY     COLONOSCOPY WITH PROPOFOL N/A 08/27/2018   Procedure: COLONOSCOPY WITH PROPOFOL;  Surgeon: Scot Jun, MD;  Location: [facility] ENDOSCOPY;  Service: Endoscopy;  Laterality: N/A;   COLONOSCOPY WITH PROPOFOL N/A 08/16/2021   Procedure: COLONOSCOPY WITH PROPOFOL;  Surgeon: Earline Mayotte, MD;  Location: [facility] ENDOSCOPY;  Service: Endoscopy;  Laterality: N/A;   DIAGNOSTIC LAPAROSCOPY     DILATION AND CURETTAGE OF UTERUS N/A 05/31/2014   Procedure: DILATATION AND CURETTAGE WITH ULTRASOUND GUIDANCE;  Surgeon: Jeani Hawking, MD;  Location: [facility];  Service: Gynecology;  Laterality: N/A;   ESOPHAGOGASTRODUODENOSCOPY  04/20/2003   KNEE SURGERY  1962   MASTECTOMY     right eye surgery     SALPINGOOPHORECTOMY Bilateral 11/08/2016   Procedure: BILATERAL SALPINGO OOPHORECTOMY;  Surgeon: Marcelle Overlie, MD;  Location: [facility];  Service: Gynecology;  Laterality: Bilateral;   UPPER GI ENDOSCOPY      Family History  Problem Relation Age of Onset   Atrial fibrillation Mother    Coronary artery disease Mother    Congenital heart disease Mother    Heart attack Father 31   Hypertension Father    Diabetes Maternal Aunt    Cancer Maternal Aunt        leukemia   Diabetes Maternal Uncle    Cancer Maternal Uncle        colon   Diabetes Other        Grandmother   Coronary artery disease Other        Grandmother   Uterine cancer Other        Grandmother   Leukemia Other        Aunt    Social History   Socioeconomic History   Marital status: Widowed    Spouse name: Not on file   Number of children: 1   Years of education: Not on file   Highest education level: Not on file  Occupational History   Occupation: insurance agency  Tobacco Use   Smoking status: Never   Smokeless tobacco: Never  Vaping Use   Vaping status: Never Used  Substance and Sexual Activity   Alcohol use: No    Alcohol/week: 0.0 standard drinks of alcohol   Drug use: No   Sexual activity: Not  Currently    Birth control/protection: Post-menopausal  Other Topics Concern   Not on file  Social History Narrative   Exercises on [employer] elliptical   Social Determinants of Health   Financial Resource Strain: Low Risk  (09/20/2022)   Overall Financial Resource  Strain (CARDIA)    Difficulty of Paying Living Expenses: Not hard at all  Food Insecurity: No Food Insecurity (09/20/2022)   Hunger Vital Sign    Worried About Running Out of Food in the Last Year: Never true    Ran Out of Food in the Last Year: Never true  Transportation Needs: No Transportation Needs (09/20/2022)   PRAPARE - Administrator, Civil Service (Medical): No    Lack of Transportation (Non-Medical): No  Physical Activity: Sufficiently Active (09/20/2022)   Exercise Vital Sign    Days of Exercise per Week: 7 days    Minutes of Exercise per Session: 60 min  Stress: No Stress Concern Present (09/20/2022)   [employer] of Occupational Health - Occupational Stress Questionnaire    Feeling of Stress : Not at all  Social Connections: Moderately Integrated (09/20/2022)   Social Connection and Isolation Panel [NHANES]    Frequency of Communication with Friends and Family: More than three times a week    Frequency of Social Gatherings with Friends and Family: More than three times a week    Attends Religious Services: More than 4 times per year    Active Member of [employer] or Organizations: Yes    Attends Banker Meetings: 1 to 4 times per year    Marital Status: Widowed  Intimate Partner Violence: Not At Risk (09/20/2022)   Humiliation, Afraid, Rape, and Kick questionnaire    Fear of Current or Ex-Partner: No    Emotionally Abused: No    Physically Abused: No    Sexually Abused: No   Review of Systems Appetite is off---eating very little Drinking okay No loss of taste or smell No N/V    Objective:   Physical Exam Constitutional:      Appearance: Normal appearance.  Pulmonary:      Effort: Pulmonary effort is normal. No respiratory distress.  Neurological:     Mental Status: She is alert.            Assessment & Plan:

## 2023-06-20 NOTE — Telephone Encounter (Signed)
Called patient set up for virtual with Dr. Alphonsus Sias. She tested positive and would like some medication sent in. Did not feel like coming in office.

## 2023-06-25 ENCOUNTER — Emergency Department: Payer: PPO

## 2023-06-25 ENCOUNTER — Other Ambulatory Visit: Payer: Self-pay

## 2023-06-25 ENCOUNTER — Telehealth: Payer: Self-pay | Admitting: Family Medicine

## 2023-06-25 DIAGNOSIS — E876 Hypokalemia: Secondary | ICD-10-CM | POA: Diagnosis not present

## 2023-06-25 DIAGNOSIS — Z85828 Personal history of other malignant neoplasm of skin: Secondary | ICD-10-CM | POA: Diagnosis not present

## 2023-06-25 DIAGNOSIS — E039 Hypothyroidism, unspecified: Secondary | ICD-10-CM | POA: Insufficient documentation

## 2023-06-25 DIAGNOSIS — G9331 Postviral fatigue syndrome: Secondary | ICD-10-CM | POA: Insufficient documentation

## 2023-06-25 DIAGNOSIS — R059 Cough, unspecified: Secondary | ICD-10-CM | POA: Diagnosis not present

## 2023-06-25 DIAGNOSIS — B349 Viral infection, unspecified: Secondary | ICD-10-CM | POA: Diagnosis not present

## 2023-06-25 DIAGNOSIS — D71 Functional disorders of polymorphonuclear neutrophils: Secondary | ICD-10-CM | POA: Diagnosis not present

## 2023-06-25 DIAGNOSIS — R918 Other nonspecific abnormal finding of lung field: Secondary | ICD-10-CM | POA: Diagnosis not present

## 2023-06-25 DIAGNOSIS — R531 Weakness: Secondary | ICD-10-CM | POA: Diagnosis not present

## 2023-06-25 LAB — COMPREHENSIVE METABOLIC PANEL
ALT: 17 U/L (ref 0–44)
AST: 23 U/L (ref 15–41)
Albumin: 3.9 g/dL (ref 3.5–5.0)
Alkaline Phosphatase: 63 U/L (ref 38–126)
Anion gap: 10 (ref 5–15)
BUN: 13 mg/dL (ref 8–23)
CO2: 23 mmol/L (ref 22–32)
Calcium: 8.1 mg/dL — ABNORMAL LOW (ref 8.9–10.3)
Chloride: 104 mmol/L (ref 98–111)
Creatinine, Ser: 0.74 mg/dL (ref 0.44–1.00)
GFR, Estimated: >60 mL/min (ref >60)
Glucose, Bld: 98 mg/dL (ref 70–99)
Potassium: 3.3 mmol/L — ABNORMAL LOW (ref 3.5–5.1)
Sodium: 137 mmol/L (ref 135–145)
Total Bilirubin: 0.9 mg/dL (ref 0.3–1.2)
Total Protein: 7.2 g/dL (ref 6.5–8.1)

## 2023-06-25 LAB — CBC WITH DIFFERENTIAL/PLATELET
Abs Immature Granulocytes: 0.06 10*3/uL (ref 0.00–0.07)
Basophils Absolute: 0 10*3/uL (ref 0.0–0.1)
Basophils Relative: 0 %
Eosinophils Absolute: 0.1 10*3/uL (ref 0.0–0.5)
Eosinophils Relative: 1 %
HCT: 37.3 % (ref 36.0–46.0)
Hemoglobin: 12.2 g/dL (ref 12.0–15.0)
Immature Granulocytes: 1 %
Lymphocytes Relative: 22 %
Lymphs Abs: 2.2 10*3/uL (ref 0.7–4.0)
MCH: 29.6 pg (ref 26.0–34.0)
MCHC: 32.7 g/dL (ref 30.0–36.0)
MCV: 90.5 fL (ref 80.0–100.0)
Monocytes Absolute: 0.8 10*3/uL (ref 0.1–1.0)
Monocytes Relative: 8 %
Neutro Abs: 6.9 10*3/uL (ref 1.7–7.7)
Neutrophils Relative %: 68 %
Platelets: 219 10*3/uL (ref 150–400)
RBC: 4.12 MIL/uL (ref 3.87–5.11)
RDW: 13 % (ref 11.5–15.5)
WBC: 10 10*3/uL (ref 4.0–10.5)
nRBC: 0 % (ref 0.0–0.2)

## 2023-06-25 LAB — TROPONIN I (HIGH SENSITIVITY): Troponin I (High Sensitivity): <2 ng/L (ref <18)

## 2023-06-25 MED ORDER — AMOXICILLIN 500 MG PO TABS: 1000.0000 mg | ORAL_TABLET | Freq: Two times a day (BID) | ORAL | 0 refills | Status: AC

## 2023-06-25 NOTE — Telephone Encounter (Signed)
Patient contacted the office regarding her visit with Dr. Alphonsus Sias on 7/18. States she is still having some chest congestion and coughing, her last dose of the medication she was given on her last visit is to be taken tonight. Patient wants to know if there is anything that Dr. Alphonsus Sias could send for her that may help with her chest congestion. Patient's preferred pharmacy is [facility] in [place], can be reached at [phone].

## 2023-06-25 NOTE — ED Triage Notes (Signed)
Pt presents to ER with c/o weakness that has been ongoing for last 2 weeks, but has been getting worse in last few days.  Pt states she was dx with COVID on 7/16, and given paxlovid, and states she began to have a productive cough with large amts of yellow colored phlegm and more sinus issues.  Pt recently started on amoxicillin by her PCP and states she took first dose today.  Pt reports not wanting to do anything but sleep, and has not wanted to eat or drink much.  Pt reports hypoxia at home, and states her O2 sats dropped to mid 80's when walking, but are normal at rest.  Pt is otherwise A&O x4 and in NAD in triage.

## 2023-06-25 NOTE — Telephone Encounter (Signed)
Left detailed message on VM per DPR. 

## 2023-06-26 ENCOUNTER — Emergency Department
Admission: EM | Admit: 2023-06-26 | Discharge: 2023-06-26 | Disposition: A | Discharge status: Home / Self Care | Payer: PPO | Attending: Emergency Medicine | Admitting: Emergency Medicine

## 2023-06-26 ENCOUNTER — Telehealth: Payer: Self-pay | Admitting: Family Medicine

## 2023-06-26 DIAGNOSIS — R058 Other specified cough: Secondary | ICD-10-CM

## 2023-06-26 DIAGNOSIS — E876 Hypokalemia: Secondary | ICD-10-CM

## 2023-06-26 LAB — TROPONIN I (HIGH SENSITIVITY): Troponin I (High Sensitivity): 3 ng/L (ref <18)

## 2023-06-26 LAB — URINALYSIS, ROUTINE W REFLEX MICROSCOPIC
Bacteria, UA: NONE SEEN
Bilirubin Urine: NEGATIVE
Glucose, UA: NEGATIVE mg/dL
Hgb urine dipstick: NEGATIVE
Ketones, ur: 5 mg/dL — AB
Leukocytes,Ua: NEGATIVE
Nitrite: NEGATIVE
Protein, ur: 100 mg/dL — AB
Specific Gravity, Urine: 1.026 (ref 1.005–1.030)
pH: 5 (ref 5.0–8.0)

## 2023-06-26 MED ORDER — POTASSIUM CHLORIDE ER 10 MEQ PO TBCR: 10.0000 meq | EXTENDED_RELEASE_TABLET | Freq: Every day | ORAL | 0 refills | Status: DC

## 2023-06-26 MED ORDER — POTASSIUM CHLORIDE CRYS ER 20 MEQ PO TBCR
40.0000 meq | EXTENDED_RELEASE_TABLET | Freq: Once | ORAL | Status: AC
  Administered 2023-06-26: 40 meq via ORAL
  Filled 2023-06-26: qty 2
  Filled 2023-06-26: qty 1

## 2023-06-26 NOTE — ED Notes (Signed)
Patient reports overall weakness and productive cough with "colored" sputum.

## 2023-06-26 NOTE — ED Notes (Signed)
Requested dose of potassium from pharmacy due to dropped dose.

## 2023-06-26 NOTE — Telephone Encounter (Signed)
It sounds like a post covid cough syndrome (common) with mildly low K  Please schedule follow up with me next week

## 2023-06-26 NOTE — ED Provider Notes (Signed)
[facility] Provider Note    Event Date/Time   First MD Initiated Contact with Patient 06/26/23 [phone]     (approximate)   History   Weakness   HPI  Gabrielle Webb is a 82 y.o. female history of acid reflux, hypothyroidism skin cancer   Reviewed primary care note from July 18, patient began feeling ill on around 716 and tested positive for COVID at that time.  Was treated with Paxlovid  Since around July 16 she has been feeling fatigued, she has had a cough that is congested at times.  She has not been particularly short of breath but at times for wristwatch when she is walking around the house is shown her low oxygen level, she and her son report they are not certain of how accurate that is though.  No chest pain no fevers or chills.  Decreased appetite but still eating and drinking.  Reports account of loss of taste and smell which is decreased her appetite.  Reports mild fatigue  No ongoing fevers chills vomiting.  Occasional loose stools  Physical Exam   Triage Vital Signs: ED Triage Vitals  Encounter Vitals Group     BP 06/25/23 2203 136/69     Systolic BP Percentile --      Diastolic BP Percentile --      Pulse Rate 06/25/23 2203 (!) 104     Resp 06/25/23 2203 20     Temp 06/25/23 2203 98.3 F (36.8 C)     Temp Source 06/25/23 2203 Oral     SpO2 06/25/23 2203 98 %     Weight 06/26/23 0152 134 lb (60.8 kg)     Height 06/26/23 0152 5\' 3"  (1.6 m)     Head Circumference --      Peak Flow --      Pain Score --      Pain Loc --      Pain Education --      Exclude from Growth Chart --     Most recent vital signs: Vitals:   06/25/23 2203 06/26/23 0152  BP: 136/69 136/68  Pulse: (!) 104 72  Resp: 20 18  Temp: 98.3 F (36.8 C) 97.8 F (36.6 C)  SpO2: 98% 100%     General: Awake, no distress.  Very pleasant.  Accompanied by her son is also pleasant CV:  Good peripheral perfusion.  Normal tones and rate. Resp:  Normal effort.   Clear lung sounds.  Occasionally has a slight nonproductive cough.  Her work of breathing is normal.  No wheezing. Abd:  No distention.  Soft nontender nondistended Other:  Mucous membranes are moist  Oxygen saturation 99 to 100% on room air.  I ambulated the patient up and down the hallway she is able to ambulate without difficulty, her oxygen saturation with ambulatory pulse ox 100%.  Checked orthostatic blood pressure and with the patient standing her blood pressure 124 systolic   ED Results / Procedures / Treatments   Labs (all labs ordered are listed, but only abnormal results are displayed) Labs Reviewed  COMPREHENSIVE METABOLIC PANEL - Abnormal; Notable for the following components:      Result Value   Potassium 3.3 (*)    Calcium 8.1 (*)    All other components within normal limits  URINALYSIS, ROUTINE W REFLEX MICROSCOPIC - Abnormal; Notable for the following components:   Color, Urine AMBER (*)    APPearance HAZY (*)    Ketones, ur 5 (*)  Protein, ur 100 (*)    All other components within normal limits  CBC WITH DIFFERENTIAL/PLATELET  TROPONIN I (HIGH SENSITIVITY)  TROPONIN I (HIGH SENSITIVITY)   CBC interpreted by me as normal, first and second troponin within normal limits.  Comprehensive metabolic panel notable for mild hypokalemia  EKG  EKG interpreted by me at 2220 heart rate 105 QRS 90 QTc 440 Sinus tachycardia no evidence of acute ischemia   RADIOLOGY CXR interpreted by me as negative for acute infiltriate  Per radiology: IMPRESSION: 1. No evidence of acute chest disease. 2. Mild emphysematous changes. 3. Mild cardiomegaly. 4. Old granulomatous disease. 5. Aortic atherosclerosis.      PROCEDURES:  Critical Care performed: No  Procedures   MEDICATIONS ORDERED IN ED: Medications  potassium chloride SA (KLOR-CON M) CR tablet 40 mEq (40 mEq Oral Given 06/26/23 0345)     IMPRESSION / MDM / ASSESSMENT AND PLAN / ED COURSE  I reviewed the  triage vital signs and the nursing notes.                              Differential diagnosis includes, but is not limited to, possible postviral cough, evaluate and exclude other cause such as pneumonia, secondary infection, anemia, metabolic abnormality, dehydration etc.  Very reassuring clinical exam with ongoing cough.  I suspect she may have some element of bronchitis post COVID.  Chest x-ray reveals no acute acute infiltrate she is afebrile blood work reassuring with exception to very mild hypokalemia which we will replete and send her home with a short prescription for.  She is able to ambulate without difficulty no hypoxia.  No chest pain no pleuritic signs or symptoms no findings would be suggestive of thromboembolic disease, or other acute secondary infection noted at this time.  Encouraged her to continue with good oral intake  She will follow-up with her primary care Dr. Melony Overly.  Discussed careful interim return precautions.  Patient's presentation is most consistent with acute complicated illness / injury requiring diagnostic workup.  The patient is on the cardiac monitor to evaluate for evidence of arrhythmia and/or significant heart rate changes.       FINAL CLINICAL IMPRESSION(S) / ED DIAGNOSES   Final diagnoses:  Post-viral cough syndrome  Hypokalemia     Rx / DC Orders   ED Discharge Orders          Ordered    potassium chloride (KLOR-CON) 10 MEQ tablet  Daily        06/26/23 0348             Note:  This document was prepared using Dragon voice recognition software and may include unintentional dictation errors.   Sharyn Creamer, MD 06/26/23 [phone]

## 2023-06-26 NOTE — Telephone Encounter (Signed)
Patient called in and stated that she was in the ER until 4:30 this morning. She stated that the provider there informed her to contact Dr. Milinda Antis and have her look over the ER notes. Thank you!

## 2023-06-27 NOTE — Telephone Encounter (Signed)
See Dr. Royden Purl comments. Please schedule virtual visit when able

## 2023-06-27 NOTE — Telephone Encounter (Signed)
Patient called in and relayed message below. Patient stated that she isn't really feeling up to coming in right now. She stated that she needed to talk to Dr. Milinda Antis. Thank you!

## 2023-06-27 NOTE — Telephone Encounter (Signed)
See prev message, pt didn't schedule virtual appt and is adamant on speaking with PCP

## 2023-06-27 NOTE — Telephone Encounter (Signed)
Spoke with patient and she cancelled her labs and physical for next week. She wanted to talk to Dr. Milinda Antis about some diarrhea she was having. She stated that it was very dark and she wasn't sure if it was from the medicine or if its something she should be concerned about. She stated that she mentioned it to Dr. Alphonsus Sias and he wanted her to discuss with you because it could be bleeding or something else going on. She stated that she is near her phone if you have any questions.

## 2023-06-27 NOTE — Telephone Encounter (Signed)
If she needs to talk /not feeling well we can set up a virtual to review everything, will need to get another K level however when feeling well enough to come in   So sorry she is not feeling well

## 2023-06-28 NOTE — Telephone Encounter (Signed)
Spoke to her  Starting to feel better but still tired No more diarrhea No more dark stool Taking K Will schedule follow up in office to re check K and check her out in person

## 2023-06-30 ENCOUNTER — Telehealth: Payer: Self-pay | Admitting: Family Medicine

## 2023-06-30 DIAGNOSIS — E876 Hypokalemia: Secondary | ICD-10-CM | POA: Insufficient documentation

## 2023-06-30 NOTE — Telephone Encounter (Signed)
-----   Message from Lovena Neighbours sent at 06/12/2023  3:43 PM EDT ----- Regarding: Labs for 7.29.24 Please put physical lab orders in future. Thank you, Denny Peon

## 2023-07-01 ENCOUNTER — Other Ambulatory Visit: Payer: PPO

## 2023-07-02 ENCOUNTER — Ambulatory Visit: Payer: PPO

## 2023-07-02 VITALS — Ht 64.0 in | Wt 130.0 lb

## 2023-07-02 DIAGNOSIS — Z0000 Encounter for general adult medical examination without abnormal findings: Secondary | ICD-10-CM | POA: Diagnosis not present

## 2023-07-02 NOTE — Patient Instructions (Signed)
[name] , Thank you for taking time to come for your Medicare Wellness Visit. I appreciate your ongoing commitment to your health goals. Please review the following plan we discussed and let me know if I can assist you in the future.   Referrals/Orders/Follow-Ups/Clinician Recommendations: Aim for 30 minutes of exercise or brisk walking, 6-8 glasses of water, and 5 servings of fruits and vegetables each day.   This is a list of the screening recommended for you and due dates:  Health Maintenance  Topic Date Due   COVID-19 Vaccine (4 - 2023-24 season) 08/03/2022   Mammogram  01/23/2023   Flu Shot  07/04/2023   Medicare Annual Wellness Visit  09/21/2023   DTaP/Tdap/Td vaccine (3 - Td or Tdap) 12/11/2027   Pneumonia Vaccine  Completed   DEXA scan (bone density measurement)  Completed   Zoster (Shingles) Vaccine  Completed   HPV Vaccine  Aged Out   Pap Smear  Discontinued   Colon Cancer Screening  Discontinued    Advanced directives: Copy on file.  Next Medicare Annual Wellness Visit scheduled for next year: Yes  Preventive Care 91 Years and Older, Female Preventive care refers to lifestyle choices and visits with your health care provider that can promote health and wellness. What does preventive care include? A yearly physical exam. This is also called an annual well check. Dental exams once or twice a year. Routine eye exams. Ask your health care provider how often you should have your eyes checked. Personal lifestyle choices, including: Daily care of your teeth and gums. Regular physical activity. Eating a healthy diet. Avoiding tobacco and drug use. Limiting alcohol use. Practicing safe sex. Taking low-dose aspirin every day. Taking vitamin and mineral supplements as recommended by your health care provider. What happens during an annual well check? The services and screenings done by your health care provider during your annual well check will depend on your age,  overall health, lifestyle risk factors, and family history of disease. Counseling  Your health care provider may ask you questions about your: Alcohol use. Tobacco use. Drug use. Emotional well-being. Home and relationship well-being. Sexual activity. Eating habits. History of falls. Memory and ability to understand (cognition). Work and work Astronomer. Reproductive health. Screening  You may have the following tests or measurements: Height, weight, and BMI. Blood pressure. Lipid and cholesterol levels. These may be checked every 5 years, or more frequently if you are over 59 years old. Skin check. Lung cancer screening. You may have this screening every year starting at age 54 if you have a 30-pack-year history of smoking and currently smoke or have quit within the past 15 years. Fecal occult blood test (FOBT) of the stool. You may have this test every year starting at age 55. Flexible sigmoidoscopy or colonoscopy. You may have a sigmoidoscopy every 5 years or a colonoscopy every 10 years starting at age 83. Hepatitis C blood test. Hepatitis B blood test. Sexually transmitted disease (STD) testing. Diabetes screening. This is done by checking your blood sugar (glucose) after you have not eaten for a while (fasting). You may have this done every 1-3 years. Bone density scan. This is done to screen for osteoporosis. You may have this done starting at age 19. Mammogram. This may be done every 1-2 years. Talk to your health care provider about how often you should have regular mammograms. Talk with your health care provider about your test results, treatment options, and if necessary, the need for more tests. Vaccines  Your health care provider may recommend certain vaccines, such as: Influenza vaccine. This is recommended every year. Tetanus, diphtheria, and acellular pertussis (Tdap, Td) vaccine. You may need a Td booster every 10 years. Zoster vaccine. You may need this after age  34. Pneumococcal 13-valent conjugate (PCV13) vaccine. One dose is recommended after age 34. Pneumococcal polysaccharide (PPSV23) vaccine. One dose is recommended after age 25. Talk to your health care provider about which screenings and vaccines you need and how often you need them. This information is not intended to replace advice given to you by your health care provider. Make sure you discuss any questions you have with your health care provider. Document Released: 12/16/2015 Document Revised: 08/08/2016 Document Reviewed: 09/20/2015 Elsevier Interactive Patient Education  2017 [employer].  Fall Prevention in the Home Falls can cause injuries. They can happen to people of all ages. There are many things you can do to make your home safe and to help prevent falls. What can I do on the outside of my home? Regularly fix the edges of walkways and driveways and fix any cracks. Remove anything that might make you trip as you walk through a door, such as a raised step or threshold. Trim any bushes or trees on the path to your home. Use bright outdoor lighting. Clear any walking paths of anything that might make someone trip, such as rocks or tools. Regularly check to see if handrails are loose or broken. Make sure that both sides of any steps have handrails. Any raised decks and porches should have guardrails on the edges. Have any leaves, snow, or ice cleared regularly. Use sand or salt on walking paths during winter. Clean up any spills in your garage right away. This includes oil or grease spills. What can I do in the bathroom? Use night lights. Install grab bars by the toilet and in the tub and shower. Do not use towel bars as grab bars. Use non-skid mats or decals in the tub or shower. If you need to sit down in the shower, use a plastic, non-slip stool. Keep the floor dry. Clean up any water that spills on the floor as soon as it happens. Remove soap buildup in the tub or shower  regularly. Attach bath mats securely with double-sided non-slip rug tape. Do not have throw rugs and other things on the floor that can make you trip. What can I do in the bedroom? Use night lights. Make sure that you have a light by your bed that is easy to reach. Do not use any sheets or blankets that are too big for your bed. They should not hang down onto the floor. Have a firm chair that has side arms. You can use this for support while you get dressed. Do not have throw rugs and other things on the floor that can make you trip. What can I do in the kitchen? Clean up any spills right away. Avoid walking on wet floors. Keep items that you use a lot in easy-to-reach places. If you need to reach something above you, use a strong step stool that has a grab bar. Keep electrical cords out of the way. Do not use floor polish or wax that makes floors slippery. If you must use wax, use non-skid floor wax. Do not have throw rugs and other things on the floor that can make you trip. What can I do with my stairs? Do not leave any items on the stairs. Make sure that there are  handrails on both sides of the stairs and use them. Fix handrails that are broken or loose. Make sure that handrails are as long as the stairways. Check any carpeting to make sure that it is firmly attached to the stairs. Fix any carpet that is loose or worn. Avoid having throw rugs at the top or bottom of the stairs. If you do have throw rugs, attach them to the floor with carpet tape. Make sure that you have a light switch at the top of the stairs and the bottom of the stairs. If you do not have them, ask someone to add them for you. What else can I do to help prevent falls? Wear shoes that: Do not have high heels. Have rubber bottoms. Are comfortable and fit you well. Are closed at the toe. Do not wear sandals. If you use a stepladder: Make sure that it is fully opened. Do not climb a closed stepladder. Make sure that  both sides of the stepladder are locked into place. Ask someone to hold it for you, if possible. Clearly mark and make sure that you can see: Any grab bars or handrails. First and last steps. Where the edge of each step is. Use tools that help you move around (mobility aids) if they are needed. These include: Canes. Walkers. Scooters. Crutches. Turn on the lights when you go into a dark area. Replace any light bulbs as soon as they burn out. Set up your furniture so you have a clear path. Avoid moving your furniture around. If any of your floors are uneven, fix them. If there are any pets around you, be aware of where they are. Review your medicines with your doctor. Some medicines can make you feel dizzy. This can increase your chance of falling. Ask your doctor what other things that you can do to help prevent falls. This information is not intended to replace advice given to you by your health care provider. Make sure you discuss any questions you have with your health care provider. Document Released: 09/15/2009 Document Revised: 04/26/2016 Document Reviewed: 12/24/2014 Elsevier Interactive Patient Education  2017 [employer].

## 2023-07-02 NOTE — Progress Notes (Signed)
Subjective:   Gabrielle Webb is a 82 y.o. female who presents for Medicare Annual (Subsequent) preventive examination.  Visit Complete: Virtual  I connected with  Gabrielle Webb on 07/02/23 by a audio enabled telemedicine application and verified that I am speaking with the correct person using two identifiers.  Patient Location: Home  Provider Location: Home Office  I discussed the limitations of evaluation and management by telemedicine. The patient expressed understanding and agreed to proceed.   Review of Systems      Cardiac Risk Factors include: advanced age (>66men, >21 women);dyslipidemia;sedentary lifestyle  Vital Signs: Per patient no change in vitals since last visit.     Objective:    Today's Vitals   07/02/23 0849  Weight: 130 lb (59 kg)  Height: 5\' 4"  (1.626 m)   Body mass index is 22.31 kg/m.     07/02/2023    9:02 AM 06/25/2023   10:05 PM 03/28/2023    8:16 AM 09/20/2022    8:42 AM 08/16/2021    8:12 AM 06/22/2020    1:21 PM 08/27/2018    7:36 AM  Advanced Directives  Does Patient Have a Medical Advance Directive? Yes No Yes Yes Yes Yes Yes  Type of Estate agent of [place];Living will   Healthcare Power of [place];Living will Healthcare Power of [place];Living will Healthcare Power of [place];Living will Healthcare Power of [place];Living will  Does patient want to make changes to medical advance directive? No - Patient declined   No - Patient declined     Copy of Healthcare Power of Attorney in Chart? Yes - validated most recent copy scanned in chart (See row information)   Yes - validated most recent copy scanned in chart (See row information)  Yes - validated most recent copy scanned in chart (See row information) No - copy requested    Current Medications (verified) Outpatient Encounter Medications as of 07/02/2023  Medication Sig   BIOTIN PO Take by mouth daily.   carboxymethylcellulose (REFRESH PLUS) 0.5 % SOLN 1  drop 3 (three) times daily as needed.   chlorthalidone (HYGROTON) 25 MG tablet TAKE TWO TABLETS EVERY DAY AS NEEDED FORSWELLING   denosumab (PROLIA) 60 MG/ML SOSY injection Inject 60 mg into the skin every 6 (six) months.   diclofenac sodium (VOLTAREN) 1 % GEL Apply 2 g topically 4 (four) times daily. Rub into affected area of foot 2 to 4 times daily (Patient taking differently: Apply 2 g topically as needed. Rub into affected area of foot 2 to 4 times daily)   dicyclomine (BENTYL) 10 MG capsule TAKE 1 CAPSULE BY MOUTH TWICE DAILY FOR SPASMS   fluticasone (FLONASE) 50 MCG/ACT nasal spray SPRAY TWICE INTO EACH NOSTRIL ONCE DAILY   levothyroxine (SYNTHROID) 75 MCG tablet TAKE ONE TABLET BY MOUTH DAILY BEFORE BREAKFAST   Phenylephrine-APAP-guaiFENesin (TYLENOL SINUS SEVERE PO) Take by mouth.   Propylene Glycol (SYSTANE COMPLETE OP) Apply to eye.   amoxicillin (AMOXIL) 500 MG tablet Take 2 tablets (1,000 mg total) by mouth 2 (two) times daily for 7 days. (Patient not taking: Reported on 07/02/2023)   atorvastatin (LIPITOR) 10 MG tablet TAKE 1 TABLET BY MOUTH DAILY (Patient not taking: Reported on 07/02/2023)   CALCIUM PO Take 1,500 mg by mouth daily. (Patient not taking: Reported on 07/02/2023)   Cholecalciferol (VITAMIN D) 2000 UNITS CAPS Take 1 capsule by mouth daily. (Patient not taking: Reported on 07/02/2023)   clobetasol cream (TEMOVATE) 0.05 % Apply 1 Application topically daily. Apply  topically to affected areas daily (Patient not taking: Reported on 07/02/2023)   Cyanocobalamin (B-12 PO) Take 2,500 mcg by mouth daily. (Patient not taking: Reported on 07/02/2023)   escitalopram (LEXAPRO) 5 MG tablet Take 1 tablet (5 mg total) by mouth daily. (Patient not taking: Reported on 07/02/2023)   famotidine (PEPCID) 20 MG tablet TAKE ONE TABLET EVERY DAY AS NEEDED FOR HEARTBURN OR INDIGESTION (Patient not taking: Reported on 07/02/2023)   Flaxseed, Linseed, (FLAX SEED OIL PO) Take by mouth. 2 a day (Patient not  taking: Reported on 07/02/2023)   Multiple Vitamin (MULTIVITAMIN) capsule Take 1 capsule by mouth daily. (Patient not taking: Reported on 07/02/2023)   Naproxen Sodium (ALEVE PO) Take 1 tablet by mouth as needed. (Patient not taking: Reported on 07/02/2023)   potassium chloride (KLOR-CON) 10 MEQ tablet Take 1 tablet (10 mEq total) by mouth daily. (Patient not taking: Reported on 07/02/2023)   No facility-administered encounter medications on file as of 07/02/2023.    Allergies (verified) Codeine, Fentanyl, Hydromorphone, Morphine, Ciprofloxacin, Tape, and Prednisone   History: Past Medical History:  Diagnosis Date   Allergy    Anxiety    Breast cancer (HCC) 7/12   Right   Cellulitis    Diverticulosis of colon    GERD (gastroesophageal reflux disease)    Headache    sinus   History of hiatal hernia    Hx of radiation therapy 06/20/11 - 07/11/11   right breast   Hypothyroid    Osteoporosis    Personal history of radiation therapy 2012   Right Breast Cancer   PONV (postoperative nausea and vomiting)    Skin cancer    basal and squamous cell   Vertigo    Past Surgical History:  Procedure Laterality Date   ABDOMINAL HYSTERECTOMY N/A 11/08/2016   Procedure: HYSTERECTOMY TOTAL  ABDOMINAL;  Surgeon: Marcelle Overlie, MD;  Location: [facility];  Service: Gynecology;  Laterality: N/A;   APPENDECTOMY  07/2007   back sugery  1970   BREAST BIOPSY Left    Benign   BREAST EXCISIONAL BIOPSY Left 1983   BREAST EXCISIONAL BIOPSY Left 1983   BREAST LUMPECTOMY Right 05/16/2011   COLONOSCOPY     COLONOSCOPY WITH PROPOFOL N/A 08/27/2018   Procedure: COLONOSCOPY WITH PROPOFOL;  Surgeon: Scot Jun, MD;  Location: [facility]COPY;  Service: Endoscopy;  Laterality: N/A;   COLONOSCOPY WITH PROPOFOL N/A 08/16/2021   Procedure: COLONOSCOPY WITH PROPOFOL;  Surgeon: Earline Mayotte, MD;  Location: [facility] ENDOSCOPY;  Service: Endoscopy;  Laterality: N/A;   DIAGNOSTIC LAPAROSCOPY     DILATION AND  CURETTAGE OF UTERUS N/A 05/31/2014   Procedure: DILATATION AND CURETTAGE WITH ULTRASOUND GUIDANCE;  Surgeon: Jeani Hawking, MD;  Location: [facility];  Service: Gynecology;  Laterality: N/A;   ESOPHAGOGASTRODUODENOSCOPY  04/20/2003   KNEE SURGERY  1962   MASTECTOMY     right eye surgery     SALPINGOOPHORECTOMY Bilateral 11/08/2016   Procedure: BILATERAL SALPINGO OOPHORECTOMY;  Surgeon: Marcelle Overlie, MD;  Location: [facility];  Service: Gynecology;  Laterality: Bilateral;   UPPER GI ENDOSCOPY     Family History  Problem Relation Age of Onset   Atrial fibrillation Mother    Coronary artery disease Mother    Congenital heart disease Mother    Heart attack Father 47   Hypertension Father    Diabetes Maternal Aunt    Cancer Maternal Aunt        leukemia   Diabetes Maternal Uncle    Cancer Maternal  Uncle        colon   Diabetes Other        Grandmother   Coronary artery disease Other        Grandmother   Uterine cancer Other        Grandmother   Leukemia Other        Aunt   Social History   Socioeconomic History   Marital status: Widowed    Spouse name: Not on file   Number of children: 1   Years of education: Not on file   Highest education level: Not on file  Occupational History   Occupation: insurance agency  Tobacco Use   Smoking status: Never   Smokeless tobacco: Never  Vaping Use   Vaping status: Never Used  Substance and Sexual Activity   Alcohol use: No    Alcohol/week: 0.0 standard drinks of alcohol   Drug use: No   Sexual activity: Not Currently    Birth control/protection: Post-menopausal  Other Topics Concern   Not on file  Social History Narrative   Exercises on [employer] elliptical   Social Determinants of Health   Financial Resource Strain: Low Risk  (07/02/2023)   Overall Financial Resource Strain (CARDIA)    Difficulty of Paying Living Expenses: Not hard at all  Food Insecurity: No Food Insecurity (07/02/2023)   Hunger Vital Sign    Worried  About Running Out of Food in the Last Year: Never true    Ran Out of Food in the Last Year: Never true  Transportation Needs: No Transportation Needs (07/02/2023)   PRAPARE - Administrator, Civil Service (Medical): No    Lack of Transportation (Non-Medical): No  Physical Activity: Patient Unable To Answer (07/02/2023)   Exercise Vital Sign    Days of Exercise per Week: Patient unable to answer    Minutes of Exercise per Session: Patient unable to answer  Stress: No Stress Concern Present (07/02/2023)   [employer] of Occupational Health - Occupational Stress Questionnaire    Feeling of Stress : Not at all  Social Connections: Moderately Isolated (07/02/2023)   Social Connection and Isolation Panel [NHANES]    Frequency of Communication with Friends and Family: More than three times a week    Frequency of Social Gatherings with Friends and Family: More than three times a week    Attends Religious Services: More than 4 times per year    Active Member of [employer] or Organizations: No    Attends Banker Meetings: Never    Marital Status: Widowed    Tobacco Counseling Counseling given: Not Answered   Clinical Intake:  Pre-visit preparation completed: Yes  Pain : No/denies pain     BMI - recorded: 22.31 Nutritional Status: BMI of 19-24  Normal Nutritional Risks: Nausea/ vomitting/ diarrhea (loose stools due to covid) Diabetes: No  How often do you need to have someone help you when you read instructions, pamphlets, or other written materials from your doctor or pharmacy?: 1 - Never  Interpreter Needed?: No  Information entered by :: C.Caitlyne Ingham LPN   Activities of Daily Living    07/02/2023    9:03 AM 05/02/2023    8:27 AM  In your present state of health, do you have any difficulty performing the following activities:  Hearing? 0 0  Vision? 0 0  Difficulty concentrating or making decisions? 1 0  Comment occasionally due to covid   Walking or  climbing stairs? 0 0  Dressing  or bathing? 0 0  Doing errands, shopping? 0 0  Preparing Food and eating ? N   Using the Toilet? N   In the past six months, have you accidently leaked urine? N   Do you have problems with loss of bowel control? N   Managing your Medications? N   Managing your Finances? N   Housekeeping or managing your Housekeeping? N     Patient Care Team: Tower, Audrie Gallus, MD as PCP - General (Family Medicine) Magrinat, Valentino Hue, MD (Inactive) as Consulting Physician (Oncology) Marcelle Overlie, MD as Consulting Physician (Obstetrics and Gynecology) Marcellus Scott, OD as Referring Physician (Ophthalmology) Geanie Logan, MD as Referring Physician (Otolaryngology) Debbrah Alar, MD as Consulting Physician (Dermatology) Josepha Pigg, DDS as Consulting Physician (Dentistry) Mariah Milling Tollie Pizza, MD as Consulting Physician (Cardiology) Kathyrn Sheriff, [facility] (Inactive) as Pharmacist (Pharmacist)  Indicate any recent Medical Services you may have received from other than [facility] providers in the past year (date may be approximate).     Assessment:   This is a routine wellness examination for [place].  Hearing/Vision screen Hearing Screening - Comments:: Deaf in left ear, no aids Vision Screening - Comments:: Glasses - [facility] - UTD on exams  Dietary issues and exercise activities discussed:     Goals Addressed             This Visit's Progress    Patient Stated       Get over COVID and get back to being active.       Depression Screen    07/02/2023    9:01 AM 05/02/2023    8:27 AM 09/20/2022    8:49 AM 07/25/2022    2:06 PM 07/04/2022    8:48 AM 07/03/2021   10:41 AM 06/22/2020    1:22 PM  PHQ 2/9 Scores  PHQ - 2 Score 0 0 0 0 0 0 0  PHQ- 9 Score  0     0    Fall Risk    07/02/2023    9:03 AM 05/02/2023    8:27 AM 09/20/2022    8:47 AM 07/25/2022    2:05 PM 07/04/2022    8:48 AM  Fall Risk   Falls in the past year? 0 0 0 0 0  Number falls in  past yr: 0 0 0    Injury with Fall? 0 0 0    Risk for fall due to : No Fall Risks No Fall Risks No Fall Risks;Impaired vision    Follow up Falls prevention discussed;Falls evaluation completed Falls evaluation completed Falls prevention discussed Falls evaluation completed Falls evaluation completed    MEDICARE RISK AT HOME:  Medicare Risk at Home - 07/02/23 0904     Any stairs in or around the home? No    If so, are there any without handrails? No    Home free of loose throw rugs in walkways, pet beds, electrical cords, etc? Yes    Adequate lighting in your home to reduce risk of falls? Yes    Life alert? No    Use of a cane, walker or w/c? No    Grab bars in the bathroom? No   Having them installed   Shower chair or bench in shower? Yes    Elevated toilet seat or a handicapped toilet? Yes             TIMED UP AND GO:  Was the test performed?  No    Cognitive Function:  06/22/2020    1:24 PM 05/26/2018    9:22 AM 05/20/2017    9:17 AM 05/11/2016    8:53 AM  MMSE - Mini Mental State Exam  Not completed: Refused     Orientation to time  5 5 5   Orientation to Place  5 5 5   Registration  3 3 3   Attention/ Calculation  0 0 0  Recall  3 3 3   Language- name 2 objects  0 0 0  Language- repeat  1 1 1   Language- follow 3 step command  3 3 3   Language- read & follow direction  0 0 0  Write a sentence  0 0 0  Copy design  0 0 0  Total score  20 20 20         07/02/2023    9:05 AM 09/20/2022    8:51 AM  6CIT Screen  What Year? 0 points 0 points  What month? 0 points 0 points  What time? 0 points 0 points  Count back from 20 0 points 0 points  Months in reverse 0 points 0 points  Repeat phrase 0 points 0 points  Total Score 0 points 0 points    Immunizations Immunization History  Administered Date(s) Administered   Fluad Quad(high Dose 65+) 08/27/2019   Influenza Split 09/17/2011   Influenza Whole 10/17/2007, 09/27/2008, 09/23/2009   Influenza, High Dose Seasonal  PF 09/19/2015, 08/26/2017, 08/23/2020, 09/08/2021   Influenza-Unspecified 09/01/2013, 10/19/2014, 08/24/2016, 10/21/2018   PFIZER(Purple Top)SARS-COV-2 Vaccination 12/29/2019, 01/19/2020, 09/15/2020   Pneumococcal Conjugate-13 05/13/2015   Pneumococcal Polysaccharide-23 12/16/2006, 05/11/2014   Td 12/03/2005   Tdap 12/10/2017   Zoster Recombinant(Shingrix) 05/21/2018, 08/12/2018   Zoster, Live 12/03/2007    TDAP status: Up to date  Flu Vaccine status: Up to date  Pneumococcal vaccine status: Up to date  Covid-19 vaccine status: Information provided on how to obtain vaccines.   Qualifies for Shingles Vaccine? Yes   Zostavax completed  unknown   Shingrix Completed?: Yes  Screening Tests Health Maintenance  Topic Date Due   COVID-19 Vaccine (4 - 2023-24 season) 08/03/2022   MAMMOGRAM  01/23/2023   INFLUENZA VACCINE  07/04/2023   Medicare Annual Wellness (AWV)  07/01/2024   DTaP/Tdap/Td (3 - Td or Tdap) 12/11/2027   Pneumonia Vaccine 22+ Years old  Completed   DEXA SCAN  Completed   Zoster Vaccines- Shingrix  Completed   HPV VACCINES  Aged Out   PAP SMEAR-Modifier  Discontinued   Colonoscopy  Discontinued    Health Maintenance  Health Maintenance Due  Topic Date Due   COVID-19 Vaccine (4 - 2023-24 season) 08/03/2022   MAMMOGRAM  01/23/2023    Colorectal cancer screening: No longer required.   Mammogram status: Completed 07/23/22. Repeat every year. Pt has appointment scheduled for 07/29/23  Bone Density status: Completed 07/23/22. Results reflect: Bone density results: OSTEOPOROSIS. Repeat every 2 years.  Lung Cancer Screening: (Low Dose CT Chest recommended if Age 81-80 years, 20 pack-year currently smoking OR have quit w/in 15years.) does not qualify.   Lung Cancer Screening Referral: n/a  Additional Screening:  Hepatitis C Screening: does not qualify; Completed n/a  Vision Screening: Recommended annual ophthalmology exams for early detection of glaucoma and  other disorders of the eye. Is the patient up to date with their annual eye exam?  Yes  Who is the provider or what is the name of the office in which the patient attends annual eye exams? [facility] If pt is not established with  a provider, would they like to be referred to a provider to establish care? Yes .   Dental Screening: Recommended annual dental exams for proper oral hygiene    Community Resource Referral / Chronic Care Management: CRR required this visit?  No   CCM required this visit?  No     Plan:     I have personally reviewed and noted the following in the patient's chart:   Medical and social history Use of alcohol, tobacco or illicit drugs  Current medications and supplements including opioid prescriptions. Patient is not currently taking opioid prescriptions. Functional ability and status Nutritional status Physical activity Advanced directives List of other physicians Hospitalizations, surgeries, and ER visits in previous 12 months Vitals Screenings to include cognitive, depression, and falls Referrals and appointments  In addition, I have reviewed and discussed with patient certain preventive protocols, quality metrics, and best practice recommendations. A written personalized care plan for preventive services as well as general preventive health recommendations were provided to patient.     Maryan Puls, LPN   03/11/8118   After Visit Summary: (MyChart) Due to this being a telephonic visit, the after visit summary with patients personalized plan was offered to patient via MyChart   Nurse Notes: Pt is currently recovering from COVID., will call the office for appointment if condition changes.

## 2023-07-03 ENCOUNTER — Telehealth: Payer: Self-pay

## 2023-07-03 NOTE — Telephone Encounter (Signed)
Transition Care Management Follow-up Telephone Call Date of discharge and from where: [place] 7/24 How have you been since you were released from the hospital? Getting better Any questions or concerns? No  Items Reviewed: Did the pt receive and understand the discharge instructions provided? Yes  Medications obtained and verified? Yes  Other? No  Any new allergies since your discharge? No  Dietary orders reviewed? No Do you have support at home? Yes    Follow up appointments reviewed:  PCP Hospital f/u appt confirmed? Yes  Scheduled to see PCP on 8/2 @ 1:30. Specialist Hospital f/u appt confirmed? No  Scheduled to see  on  @ . Are transportation arrangements needed? Yes  If their condition worsens, is the pt aware to call PCP or go to the Emergency Dept.? Yes Was the patient provided with contact information for the PCP's office or ED? Yes Was to pt encouraged to call back with questions or concerns? Yes   Lenard Forth [facility] Guide, [facility] [phone] [address] [place], [place], [place] [address] Phone: [phone] Email: [email or web address]

## 2023-07-03 NOTE — Telephone Encounter (Signed)
Transition Care Management Unsuccessful Follow-up Telephone Call  Date of discharge and from where:  [place] 7/24  Attempts:  1st Attempt  Reason for unsuccessful TCM follow-up call:  No answer/busy   Lenard Forth [facility] Guide, [facility] [phone] [address] [place], [place], [place] [address] Phone: [phone] Email: [email or web address]

## 2023-07-05 ENCOUNTER — Encounter: Payer: Self-pay | Admitting: Family Medicine

## 2023-07-05 ENCOUNTER — Ambulatory Visit (INDEPENDENT_AMBULATORY_CARE_PROVIDER_SITE_OTHER): Payer: PPO | Admitting: Family Medicine

## 2023-07-05 VITALS — BP 100/70 | HR 64 | Temp 97.6°F | Ht 63.0 in | Wt 132.0 lb

## 2023-07-05 DIAGNOSIS — E876 Hypokalemia: Secondary | ICD-10-CM

## 2023-07-05 DIAGNOSIS — U099 Post covid-19 condition, unspecified: Secondary | ICD-10-CM

## 2023-07-05 DIAGNOSIS — R195 Other fecal abnormalities: Secondary | ICD-10-CM | POA: Diagnosis not present

## 2023-07-05 DIAGNOSIS — R5382 Chronic fatigue, unspecified: Secondary | ICD-10-CM | POA: Diagnosis not present

## 2023-07-05 LAB — COMPREHENSIVE METABOLIC PANEL
ALT: 11 U/L (ref 0–35)
AST: 18 U/L (ref 0–37)
Albumin: 4 g/dL (ref 3.5–5.2)
Alkaline Phosphatase: 58 U/L (ref 39–117)
BUN: 12 mg/dL (ref 6–23)
CO2: 30 mEq/L (ref 19–32)
Calcium: 9.4 mg/dL (ref 8.4–10.5)
Chloride: 103 mEq/L (ref 96–112)
Creatinine, Ser: 0.63 mg/dL (ref 0.40–1.20)
GFR: 83.95 mL/min (ref >60.00)
Glucose, Bld: 89 mg/dL (ref 70–99)
Potassium: 4.1 mEq/L (ref 3.5–5.1)
Sodium: 139 mEq/L (ref 135–145)
Total Bilirubin: 0.3 mg/dL (ref 0.2–1.2)
Total Protein: 7.1 g/dL (ref 6.0–8.3)

## 2023-07-05 LAB — CBC WITH DIFFERENTIAL/PLATELET
Basophils Absolute: 0 10*3/uL (ref 0.0–0.1)
Basophils Relative: 0.3 % (ref 0.0–3.0)
Eosinophils Absolute: 0 10*3/uL (ref 0.0–0.7)
Eosinophils Relative: 0.5 % (ref 0.0–5.0)
HCT: 35.9 % — ABNORMAL LOW (ref 36.0–46.0)
Hemoglobin: 11.8 g/dL — ABNORMAL LOW (ref 12.0–15.0)
Lymphocytes Relative: 30.2 % (ref 12.0–46.0)
Lymphs Abs: 2.4 10*3/uL (ref 0.7–4.0)
MCHC: 32.9 g/dL (ref 30.0–36.0)
MCV: 90.9 fl (ref 78.0–100.0)
Monocytes Absolute: 0.7 10*3/uL (ref 0.1–1.0)
Monocytes Relative: 8.4 % (ref 3.0–12.0)
Neutro Abs: 4.9 10*3/uL (ref 1.4–7.7)
Neutrophils Relative %: 60.6 % (ref 43.0–77.0)
Platelets: 329 10*3/uL (ref 150.0–400.0)
RBC: 3.95 Mil/uL (ref 3.87–5.11)
RDW: 13.5 % (ref 11.5–15.5)
WBC: 8.1 10*3/uL (ref 4.0–10.5)

## 2023-07-05 NOTE — Progress Notes (Signed)
Subjective:    Patient ID: Gabrielle Webb, female    DOB: 23-Dec-1940, 82 y.o.   MRN: [id number]  HPI  Wt Readings from Last 3 Encounters:  07/05/23 132 lb (59.9 kg)  07/02/23 130 lb (59 kg)  06/26/23 134 lb (60.8 kg)   23.38 kg/m  Vitals:   07/05/23 1050  BP: 100/70  Pulse: 64  Temp: 97.6 F (36.4 C)  SpO2: 99%    Pt presents for follow up of ER visit for post viral cough syndrome and low K   Pt presented to ER on 06/2023 for cough Had covid (tested positive on 7/16) and still not feeling well  Very fatigued  Cough/congestion on and off and occational felt shortness of breath with exertional  Did have some diarrhea with covid   Had re assuring cxr  DG Chest 1 View  Result Date: 06/25/2023 CLINICAL DATA:  10031. Coughing and weakness. COVID diagnosis July 16 on Paxlovid. EXAM: CHEST  1 VIEW COMPARISON:  Rib series 10/04/2015, PA Lat 10/21/2013. FINDINGS: There are mild emphysematous changes with no appreciable focal infiltrate. Inspiration is lower than on prior studies. No pleural effusion is evident. There is mild cardiomegaly with no evidence of CHF. Calcified right hilar and AP window lymph nodes are again noted with stable mediastinal configuration, minimal aortic atherosclerosis. There is mild thoracic levoscoliosis with osteopenia and degenerative changes of the spine. IMPRESSION: 1. No evidence of acute chest disease. 2. Mild emphysematous changes. 3. Mild cardiomegaly. 4. Old granulomatous disease. 5. Aortic atherosclerosis. Electronically Signed   By: Almira Bar M.D.   On: 06/25/2023 22:33    No new findings   Also low K  Lab Results  Component Value Date   NA 137 06/25/2023   K 3.3 (L) 06/25/2023   CO2 23 06/25/2023   GLUCOSE 98 06/25/2023   BUN 13 06/25/2023   CREATININE 0.74 06/25/2023   CALCIUM 8.1 (L) 06/25/2023   GFR 77.88 05/29/2023   EGFR 82 (L) 07/03/2016   GFRNONAA >60 06/25/2023   Was prescription clor con 10 meq daily for 5 days   Lab  Results  Component Value Date   ALT 17 06/25/2023   AST 23 06/25/2023   ALKPHOS 63 06/25/2023   BILITOT 0.9 06/25/2023   Lab Results  Component Value Date   WBC 10.0 06/25/2023   HGB 12.2 06/25/2023   HCT 37.3 06/25/2023   MCV 90.5 06/25/2023   PLT 219 06/25/2023   EKG and troponin ok   Some cough  Robitussin with honey in it / is helpful   Her diarrhea was dark but improved Still mildly mushy/not loose  Dark  She did take simethicone but  Had 2 doses of pepto  Her abdomen gripes but no pain  No n/v  No visible blood   Has a history of IBS in the past Bentyl is on med list   Takes pepcid for GERD  Occational tylenol Some sinus pressure /congestion    Patient Active Problem List   Diagnosis Date Noted   Post covid-19 condition, unspecified 07/05/2023   Dark stools 07/05/2023   Hypokalemia 06/30/2023   COVID-19 virus infection 06/20/2023   Skin lesion 05/02/2023   Right knee pain 04/24/2023   Vaginal pain 01/18/2023   Grief reaction 07/04/2022   Encounter for screening mammogram for breast cancer 12/18/2021   Anxious mood as adjustment reaction 06/09/2021   Breast lump on right side at 8 o'clock position 05/03/2020   History of breast cancer  05/03/2020   Medicare annual wellness visit, subsequent 05/28/2018   Estrogen deficiency 05/28/2018   Fatigue 05/22/2017   Chest discomfort 05/22/2017   S/P TAH-BSO 11/08/2016   Routine general medical examination at a health care facility 05/06/2016   Malignant neoplasm of upper-outer quadrant of right breast in female, estrogen receptor positive (HCC) 03/21/2015   History of tamoxifen therapy 05/18/2014   Encounter for routine gynecological examination 05/06/2013   Colon cancer screening 05/06/2013   Encounter for Medicare annual wellness exam 04/28/2013   Hx of radiation therapy    Hx of Breast cancer, stage 1, Right, UOQ, Receptor+,Her2- 05/29/2011    Class: Stage 1   Other screening mammogram 03/21/2011    Post-menopausal 03/21/2011   Hypothyroidism 01/23/2008   Pure hypercholesterolemia 01/23/2008   ALLERGIC RHINITIS 01/23/2008   GERD 01/23/2008   DIVERTICULOSIS, COLON 01/23/2008   IBS 01/23/2008   FIBROCYSTIC BREAST DISEASE 01/23/2008   Osteoporosis 10/17/2007   Past Medical History:  Diagnosis Date   Allergy    Anxiety    Breast cancer (HCC) 7/12   Right   Cellulitis    Diverticulosis of colon    GERD (gastroesophageal reflux disease)    Headache    sinus   History of hiatal hernia    Hx of radiation therapy 06/20/11 - 07/11/11   right breast   Hypothyroid    Osteoporosis    Personal history of radiation therapy 2012   Right Breast Cancer   PONV (postoperative nausea and vomiting)    Skin cancer    basal and squamous cell   Vertigo    Past Surgical History:  Procedure Laterality Date   ABDOMINAL HYSTERECTOMY N/A 11/08/2016   Procedure: HYSTERECTOMY TOTAL  ABDOMINAL;  Surgeon: Marcelle Overlie, MD;  Location: [facility];  Service: Gynecology;  Laterality: N/A;   APPENDECTOMY  07/2007   back sugery  1970   BREAST BIOPSY Left    Benign   BREAST EXCISIONAL BIOPSY Left 1983   BREAST EXCISIONAL BIOPSY Left 1983   BREAST LUMPECTOMY Right 05/16/2011   COLONOSCOPY     COLONOSCOPY WITH PROPOFOL N/A 08/27/2018   Procedure: COLONOSCOPY WITH PROPOFOL;  Surgeon: Scot Jun, MD;  Location: [facility];  Service: Endoscopy;  Laterality: N/A;   COLONOSCOPY WITH PROPOFOL N/A 08/16/2021   Procedure: COLONOSCOPY WITH PROPOFOL;  Surgeon: Earline Mayotte, MD;  Location: [facility] ENDOSCOPY;  Service: Endoscopy;  Laterality: N/A;   DIAGNOSTIC LAPAROSCOPY     DILATION AND CURETTAGE OF UTERUS N/A 05/31/2014   Procedure: DILATATION AND CURETTAGE WITH ULTRASOUND GUIDANCE;  Surgeon: Jeani Hawking, MD;  Location: [facility];  Service: Gynecology;  Laterality: N/A;   ESOPHAGOGASTRODUODENOSCOPY  04/20/2003   KNEE SURGERY  1962   MASTECTOMY     right eye surgery     SALPINGOOPHORECTOMY  Bilateral 11/08/2016   Procedure: BILATERAL SALPINGO OOPHORECTOMY;  Surgeon: Marcelle Overlie, MD;  Location: [facility];  Service: Gynecology;  Laterality: Bilateral;   UPPER GI ENDOSCOPY     Social History   Tobacco Use   Smoking status: Never   Smokeless tobacco: Never  Vaping Use   Vaping status: Never Used  Substance Use Topics   Alcohol use: No    Alcohol/week: 0.0 standard drinks of alcohol   Drug use: No   Family History  Problem Relation Age of Onset   Atrial fibrillation Mother    Coronary artery disease Mother    Congenital heart disease Mother    Heart attack Father 62   Hypertension Father  Diabetes Maternal Aunt    Cancer Maternal Aunt        leukemia   Diabetes Maternal Uncle    Cancer Maternal Uncle        colon   Diabetes Other        Grandmother   Coronary artery disease Other        Grandmother   Uterine cancer Other        Grandmother   Leukemia Other        Aunt   Allergies  Allergen Reactions   Codeine Nausea And Vomiting   Fentanyl Nausea And Vomiting   Hydromorphone Nausea And Vomiting        Morphine Nausea And Vomiting   Ciprofloxacin     Intolerant- tingling    Tape     Surgical tape   Prednisone Palpitations    Rapid hear beat   Current Outpatient Medications on File Prior to Visit  Medication Sig Dispense Refill   atorvastatin (LIPITOR) 10 MG tablet TAKE 1 TABLET BY MOUTH DAILY 90 tablet 0   BIOTIN PO Take by mouth daily.     CALCIUM PO Take 1,500 mg by mouth daily.     carboxymethylcellulose (REFRESH PLUS) 0.5 % SOLN 1 drop 3 (three) times daily as needed.     chlorthalidone (HYGROTON) 25 MG tablet TAKE TWO TABLETS EVERY DAY AS NEEDED FORSWELLING 180 tablet 0   Cholecalciferol (VITAMIN D) 2000 UNITS CAPS Take 1 capsule by mouth daily.     Cyanocobalamin (B-12 PO) Take 2,500 mcg by mouth daily.     denosumab (PROLIA) 60 MG/ML SOSY injection Inject 60 mg into the skin every 6 (six) months. 1 mL 0   diclofenac sodium (VOLTAREN) 1  % GEL Apply 2 g topically 4 (four) times daily. Rub into affected area of foot 2 to 4 times daily (Patient taking differently: Apply 2 g topically as needed. Rub into affected area of foot 2 to 4 times daily) 100 g 2   dicyclomine (BENTYL) 10 MG capsule TAKE 1 CAPSULE BY MOUTH TWICE DAILY FOR SPASMS 180 capsule 2   famotidine (PEPCID) 20 MG tablet TAKE ONE TABLET EVERY DAY AS NEEDED FOR HEARTBURN OR INDIGESTION 90 tablet 3   Flaxseed, Linseed, (FLAX SEED OIL PO) Take by mouth. 2 a day     fluticasone (FLONASE) 50 MCG/ACT nasal spray SPRAY TWICE INTO EACH NOSTRIL ONCE DAILY 18 g 2   levothyroxine (SYNTHROID) 75 MCG tablet TAKE ONE TABLET BY MOUTH DAILY BEFORE BREAKFAST 90 tablet 3   Multiple Vitamin (MULTIVITAMIN) capsule Take 1 capsule by mouth daily.     Phenylephrine-APAP-guaiFENesin (TYLENOL SINUS SEVERE PO) Take by mouth.     Propylene Glycol (SYSTANE COMPLETE OP) Apply to eye.     escitalopram (LEXAPRO) 5 MG tablet Take 1 tablet (5 mg total) by mouth daily. (Patient not taking: Reported on 07/02/2023) 90 tablet 3   No current facility-administered medications on file prior to visit.    Review of Systems  Constitutional:  Positive for fatigue. Negative for activity change, appetite change, fever and unexpected weight change.  HENT:  Positive for congestion. Negative for ear pain, rhinorrhea, sinus pressure and sore throat.   Eyes:  Negative for pain, redness and visual disturbance.  Respiratory:  Positive for cough. Negative for chest tightness, shortness of breath and wheezing.        Cough is improved   Cardiovascular:  Negative for chest pain and palpitations.  Gastrointestinal:  Positive for diarrhea. Negative for  abdominal distention, abdominal pain, anal bleeding, blood in stool, constipation, nausea, rectal pain and vomiting.       Dark stool  Endocrine: Negative for polydipsia and polyuria.  Genitourinary:  Negative for dysuria, frequency and urgency.  Musculoskeletal:  Negative  for arthralgias, back pain and myalgias.  Skin:  Negative for pallor and rash.  Allergic/Immunologic: Negative for environmental allergies.  Neurological:  Negative for dizziness, syncope and headaches.  Hematological:  Negative for adenopathy. Does not bruise/bleed easily.  Psychiatric/Behavioral:  Negative for decreased concentration and dysphoric mood. The patient is not nervous/anxious.        Objective:   Physical Exam Constitutional:      General: She is not in acute distress.    Appearance: Normal appearance. She is well-developed and normal weight. She is not ill-appearing or diaphoretic.  HENT:     Head: Normocephalic and atraumatic.     Nose: No congestion.     Mouth/Throat:     Mouth: Mucous membranes are moist.  Eyes:     General: No scleral icterus.       Right eye: No discharge.        Left eye: No discharge.     Conjunctiva/sclera: Conjunctivae normal.     Pupils: Pupils are equal, round, and reactive to light.  Neck:     Thyroid: No thyromegaly.     Vascular: No carotid bruit or JVD.  Cardiovascular:     Rate and Rhythm: Normal rate and regular rhythm.     Heart sounds: Normal heart sounds.     No gallop.  Pulmonary:     Effort: Pulmonary effort is normal. No respiratory distress.     Breath sounds: Normal breath sounds. No stridor. No wheezing, rhonchi or rales.     Comments: Good air exch No wheeze even on forced expiration  Abdominal:     General: There is no distension or abdominal bruit.     Palpations: Abdomen is soft. There is no hepatomegaly, splenomegaly, mass or pulsatile mass.     Tenderness: There is no abdominal tenderness. There is no right CVA tenderness, left CVA tenderness, guarding or rebound.  Genitourinary:    Rectum: Normal. Guaiac result negative. No mass, tenderness or external hemorrhoid. Normal anal tone.  Musculoskeletal:     Cervical back: Normal range of motion and neck supple.     Right lower leg: No edema.     Left lower leg:  No edema.  Lymphadenopathy:     Cervical: No cervical adenopathy.  Skin:    General: Skin is warm and dry.     Coloration: Skin is not pale.     Findings: No rash.  Neurological:     Mental Status: She is alert.     Coordination: Coordination normal.     Deep Tendon Reflexes: Reflexes are normal and symmetric. Reflexes normal.  Psychiatric:        Mood and Affect: Mood normal.           Assessment & Plan:   Problem List Items Addressed This Visit       Other   Post covid-19 condition, unspecified - Primary    Still some fatigue Some mild sinus congestion and cough  Discussed symptoms care and rest and fluids  Using robitussin with honey  Update if not starting to improve in a week or if worsening  Call back and Er precautions noted in detail today   Reassuring exam  Lab today  Hypokalemia    This became low from diarrhea that started shortly before covid  Was given klor con 10 meq daily for 5 d from ER Reviewed ER note from 7/24  Reviewed hospital records, lab results and studies in detail   Diarrhea has improved/ stool is still soft (dark /presumably from [employer])  Lab today        Relevant Orders   Comprehensive metabolic panel   Fatigue    I suspect this is post covid  Seen in ER Reviewed hospital records, lab results and studies in detail   Lab today  Reassuring exam      Relevant Orders   CBC with Differential/Platelet   Comprehensive metabolic panel   Dark stools    With diarrhea that is improving In course- she took several doses of pepto (may be cause)  Rectal exam normal today/ soft stool in vault that is dark brown and heme negative Very reassuring  Normal abd exam Instructed to avoid pepto and let us know if no further improvement

## 2023-07-05 NOTE — Assessment & Plan Note (Signed)
I suspect this is post covid  Seen in ER Reviewed hospital records, lab results and studies in detail   Lab today  Reassuring exam

## 2023-07-05 NOTE — Assessment & Plan Note (Signed)
This became low from diarrhea that started shortly before covid  Was given klor con 10 meq daily for 5 d from ER Reviewed ER note from 7/24  Reviewed hospital records, lab results and studies in detail   Diarrhea has improved/ stool is still soft (dark /presumably from [employer])  Lab today

## 2023-07-05 NOTE — Assessment & Plan Note (Signed)
With diarrhea that is improving In course- she took several doses of pepto (may be cause)  Rectal exam normal today/ soft stool in vault that is dark brown and heme negative Very reassuring  Normal abd exam Instructed to avoid pepto and let us know if no further improvement

## 2023-07-05 NOTE — Assessment & Plan Note (Signed)
Still some fatigue Some mild sinus congestion and cough  Discussed symptoms care and rest and fluids  Using robitussin with honey  Update if not starting to improve in a week or if worsening  Call back and Er precautions noted in detail today   Reassuring exam  Lab today

## 2023-07-05 NOTE — Patient Instructions (Addendum)
A probiotic like Align as directed is helpful to get your gut flora (good bacteria) back to normal  Use it for a few weeks if you tolerate it   Your stool does not have blood in it today (very reassuring)   Make sure you keep up with your fluids   The pepto may have made your stool black   If no improvement in bowel issues we will get you back to GI   Labs today   If the respiratory symptoms do not improve or worsen let us know Fatigue will take a while to improve   If severe -go to the hospital

## 2023-07-08 ENCOUNTER — Telehealth: Payer: Self-pay | Admitting: Family Medicine

## 2023-07-08 ENCOUNTER — Encounter: Payer: PPO | Admitting: Family Medicine

## 2023-07-08 NOTE — Telephone Encounter (Signed)
Patient is requesting a call back from Dr Milinda Antis regarding her IBS Diagnosis. She has some questions.

## 2023-07-08 NOTE — Telephone Encounter (Signed)
The letter was sent to the patient on 01/21/2023 Im not not sure why it is showing on her end 07/06/2023  Information for Referral #: [id number]   Diagnoses:   R10.2 (ICD-10-CM) - Vaginal pain   Procedures: REF30 - AMB REFERRAL TO GYNECOLOGY Authorization #:     Referring Provider Information Judy Pimple [facility] [phone] Referring To Provider Information Marcelle Overlie [address] [place] [address] [phone]   Referral Start Date: 01/18/2023 Referral End Date: 01/18/2024     I have updated the referral on my end to reflect her appt date of 07/05/2023  I have spoken with the patient and cleared this up

## 2023-07-08 NOTE — Telephone Encounter (Signed)
Patient returned call.I scheduled her for a non fasting lab for her ferritin and iron.

## 2023-07-08 NOTE — Telephone Encounter (Signed)
LVM for patient to schedule a non fasting lab appt.

## 2023-07-08 NOTE — Telephone Encounter (Signed)
Pt called back stating she received a letter regarding a referral sent to her OBGYN, Dr. Aaron Edelman. Pt is wondering why was a referral sent? Pt states she last saw Dr. Vincente Poli on 07/05/23 & received letter on 07/06/23. Call back # [phone]

## 2023-07-09 ENCOUNTER — Telehealth (INDEPENDENT_AMBULATORY_CARE_PROVIDER_SITE_OTHER): Payer: PPO | Admitting: Family Medicine

## 2023-07-09 ENCOUNTER — Other Ambulatory Visit (INDEPENDENT_AMBULATORY_CARE_PROVIDER_SITE_OTHER): Payer: PPO

## 2023-07-09 DIAGNOSIS — D649 Anemia, unspecified: Secondary | ICD-10-CM | POA: Insufficient documentation

## 2023-07-09 LAB — CBC WITH DIFFERENTIAL/PLATELET
Basophils Absolute: 0 10*3/uL (ref 0.0–0.1)
Basophils Relative: 0.4 % (ref 0.0–3.0)
Eosinophils Absolute: 0.1 10*3/uL (ref 0.0–0.7)
Eosinophils Relative: 1 % (ref 0.0–5.0)
HCT: 36.7 % (ref 36.0–46.0)
Hemoglobin: 12.1 g/dL (ref 12.0–15.0)
Lymphocytes Relative: 31.3 % (ref 12.0–46.0)
Lymphs Abs: 2 10*3/uL (ref 0.7–4.0)
MCHC: 32.8 g/dL (ref 30.0–36.0)
MCV: 91.1 fl (ref 78.0–100.0)
Monocytes Absolute: 0.5 10*3/uL (ref 0.1–1.0)
Monocytes Relative: 8.6 % (ref 3.0–12.0)
Neutro Abs: 3.7 10*3/uL (ref 1.4–7.7)
Neutrophils Relative %: 58.7 % (ref 43.0–77.0)
Platelets: 299 10*3/uL (ref 150.0–400.0)
RBC: 4.03 Mil/uL (ref 3.87–5.11)
RDW: 13.9 % (ref 11.5–15.5)
WBC: 6.3 10*3/uL (ref 4.0–10.5)

## 2023-07-09 LAB — IRON: Iron: 93 ug/dL (ref 42–145)

## 2023-07-09 LAB — FERRITIN: Ferritin: 60.9 ng/mL (ref 10.0–291.0)

## 2023-07-09 NOTE — Telephone Encounter (Signed)
Both covid and IBS can cause diarrhea (she may have had covid several days before she knew she did)  Stress worsens IBS also - and covid was definitely stressful  Glad the diarrhea is improving I think a trial of align is a good idea  If no further improvement please follow up for an office visit

## 2023-07-09 NOTE — Telephone Encounter (Signed)
Called pt and instead of sending a mychart she wanted to give me the info and have PCP just call her tomorrow when she is back in office to discuss this.  Pt said she read that "Covid and IBS don't go together".  She is questioning if her having IBS and covid is why she is having horrible GI problems and diarrhea. Pt said a few days before she even was diagnosed with covid she was having severe gas to the point she went to the pharmacy and they recommended OTC gas med. Pt said then she was diagnosed with covid.   Pt said she is still having dark stools but the diarrhea is slowing down. Pt is asking if she needs to be on a med just for IBS. She is on bentyl but not sure if she needs to be on other meds to help the diarrhea and dark stools.  Pt did buy Align and yogurt to see if that helps, she hasn't started the Align yet she is just eating more yogurt but wanted to see what PCP thought.  Pt is just trying to see if having covid flared up her IBS or if there is something else going on.   **pt will await a call from PCP to discuss this and is okay waiting until PCP is back in the office tomorrow to discuss this**

## 2023-07-09 NOTE — Telephone Encounter (Signed)
Please call her and ask her to send her questions by my chart- I am on my way out right now but will be able to see her concerns late tonight or tomorrow

## 2023-07-09 NOTE — Telephone Encounter (Signed)
-----   Message from Alvina Chou sent at 07/09/2023  7:13 AM EDT ----- Regarding: order labs for today Please put orders in for today, thanks

## 2023-07-10 NOTE — Telephone Encounter (Signed)
Pt notified of Dr. Tower's comments and verbalized understanding  

## 2023-07-19 DIAGNOSIS — D2272 Melanocytic nevi of left lower limb, including hip: Secondary | ICD-10-CM | POA: Diagnosis not present

## 2023-07-19 DIAGNOSIS — Z85828 Personal history of other malignant neoplasm of skin: Secondary | ICD-10-CM | POA: Diagnosis not present

## 2023-07-19 DIAGNOSIS — L570 Actinic keratosis: Secondary | ICD-10-CM | POA: Diagnosis not present

## 2023-07-19 DIAGNOSIS — Z872 Personal history of diseases of the skin and subcutaneous tissue: Secondary | ICD-10-CM | POA: Diagnosis not present

## 2023-07-19 DIAGNOSIS — D2262 Melanocytic nevi of left upper limb, including shoulder: Secondary | ICD-10-CM | POA: Diagnosis not present

## 2023-07-19 DIAGNOSIS — D2261 Melanocytic nevi of right upper limb, including shoulder: Secondary | ICD-10-CM | POA: Diagnosis not present

## 2023-07-22 DIAGNOSIS — M25561 Pain in right knee: Secondary | ICD-10-CM | POA: Diagnosis not present

## 2023-07-22 DIAGNOSIS — K644 Residual hemorrhoidal skin tags: Secondary | ICD-10-CM | POA: Diagnosis not present

## 2023-07-22 DIAGNOSIS — Z1272 Encounter for screening for malignant neoplasm of vagina: Secondary | ICD-10-CM | POA: Diagnosis not present

## 2023-07-22 DIAGNOSIS — Z853 Personal history of malignant neoplasm of breast: Secondary | ICD-10-CM | POA: Diagnosis not present

## 2023-07-22 DIAGNOSIS — M7918 Myalgia, other site: Secondary | ICD-10-CM | POA: Diagnosis not present

## 2023-07-22 DIAGNOSIS — Z6823 Body mass index (BMI) 23.0-23.9, adult: Secondary | ICD-10-CM | POA: Diagnosis not present

## 2023-07-22 DIAGNOSIS — Z124 Encounter for screening for malignant neoplasm of cervix: Secondary | ICD-10-CM | POA: Diagnosis not present

## 2023-07-29 ENCOUNTER — Ambulatory Visit: Admission: RE | Admit: 2023-07-29 | Payer: PPO | Source: Ambulatory Visit

## 2023-07-29 ENCOUNTER — Ambulatory Visit: Payer: PPO

## 2023-07-29 DIAGNOSIS — Z1231 Encounter for screening mammogram for malignant neoplasm of breast: Secondary | ICD-10-CM

## 2023-07-31 ENCOUNTER — Telehealth: Payer: Self-pay | Admitting: Family Medicine

## 2023-07-31 NOTE — Telephone Encounter (Signed)
Patient contacted the office regarding mammogram results, asked if Dr. Milinda Antis has viewed them and if not she asked for a call when she does review them.

## 2023-07-31 NOTE — Telephone Encounter (Signed)
Mammogram was normal  Noted in mychart  Repeat in a year

## 2023-08-01 NOTE — Telephone Encounter (Signed)
Pt viewed results and Dr. Tower's comments via mychart  ?

## 2023-08-21 ENCOUNTER — Other Ambulatory Visit: Payer: Self-pay

## 2023-08-21 ENCOUNTER — Ambulatory Visit (INDEPENDENT_AMBULATORY_CARE_PROVIDER_SITE_OTHER): Payer: PPO | Admitting: Internal Medicine

## 2023-08-21 ENCOUNTER — Ambulatory Visit: Payer: PPO | Attending: Obstetrics and Gynecology

## 2023-08-21 ENCOUNTER — Encounter: Payer: Self-pay | Admitting: Internal Medicine

## 2023-08-21 VITALS — BP 94/68 | HR 69 | Temp 97.1°F | Ht 63.0 in | Wt 132.0 lb

## 2023-08-21 DIAGNOSIS — M25561 Pain in right knee: Secondary | ICD-10-CM | POA: Insufficient documentation

## 2023-08-21 DIAGNOSIS — M6281 Muscle weakness (generalized): Secondary | ICD-10-CM | POA: Insufficient documentation

## 2023-08-21 DIAGNOSIS — R1032 Left lower quadrant pain: Secondary | ICD-10-CM | POA: Diagnosis not present

## 2023-08-21 LAB — COMPREHENSIVE METABOLIC PANEL WITH GFR
ALT: 10 U/L (ref 0–35)
AST: 19 U/L (ref 0–37)
Albumin: 4.2 g/dL (ref 3.5–5.2)
Alkaline Phosphatase: 55 U/L (ref 39–117)
BUN: 14 mg/dL (ref 6–23)
CO2: 30 meq/L (ref 19–32)
Calcium: 9.6 mg/dL (ref 8.4–10.5)
Chloride: 103 meq/L (ref 96–112)
Creatinine, Ser: 0.75 mg/dL (ref 0.40–1.20)
GFR: 75.28 mL/min (ref >60.00)
Glucose, Bld: 90 mg/dL (ref 70–99)
Potassium: 4 meq/L (ref 3.5–5.1)
Sodium: 140 meq/L (ref 135–145)
Total Bilirubin: 0.8 mg/dL (ref 0.2–1.2)
Total Protein: 6.9 g/dL (ref 6.0–8.3)

## 2023-08-21 LAB — CBC
HCT: 38.3 % (ref 36.0–46.0)
Hemoglobin: 12.6 g/dL (ref 12.0–15.0)
MCHC: 32.9 g/dL (ref 30.0–36.0)
MCV: 91.4 fl (ref 78.0–100.0)
Platelets: 179 10*3/uL (ref 150.0–400.0)
RBC: 4.19 Mil/uL (ref 3.87–5.11)
RDW: 14.8 % (ref 11.5–15.5)
WBC: 8.6 10*3/uL (ref 4.0–10.5)

## 2023-08-21 LAB — SEDIMENTATION RATE: Sed Rate: 16 mm/h (ref 0–30)

## 2023-08-21 NOTE — Progress Notes (Signed)
Subjective:    Patient ID: Gabrielle Webb, female    DOB: [birth year], 82 y.o.   MRN: [id number]  HPI Here due to left groin pain  Had a bad spell with the COVID Lots of diarrhea "Everything I eat goes through me" But the diarrhea predated the COVID  Having pain in left inguinal area----first time it woke her from sleep Intermittent but started about a month ago Had arm and leg symptoms with this as well--numbness in arm, tingling in leg (only the first time) Tried aspirin Feels it otherwise during the day Soreness is fairly persistent--no obvious aggravating causes  Woke this morning and took bentyl Also diarrhea Did help some  "Everything I eat goes through me" Moves bowels with all meals Small caliber--not really watery Has been on probiotics---no clear help  Current Outpatient Medications on File Prior to Visit  Medication Sig Dispense Refill   atorvastatin (LIPITOR) 10 MG tablet TAKE 1 TABLET BY MOUTH DAILY 90 tablet 0   BIOTIN PO Take by mouth daily.     CALCIUM PO Take 1,500 mg by mouth daily.     carboxymethylcellulose (REFRESH PLUS) 0.5 % SOLN 1 drop 3 (three) times daily as needed.     chlorthalidone (HYGROTON) 25 MG tablet TAKE TWO TABLETS EVERY DAY AS NEEDED FORSWELLING 180 tablet 0   Cholecalciferol (VITAMIN D) 2000 UNITS CAPS Take 1 capsule by mouth daily.     Cyanocobalamin (B-12 PO) Take 2,500 mcg by mouth daily.     denosumab (PROLIA) 60 MG/ML SOSY injection Inject 60 mg into the skin every 6 (six) months. 1 mL 0   diclofenac sodium (VOLTAREN) 1 % GEL Apply 2 g topically 4 (four) times daily. Rub into affected area of foot 2 to 4 times daily (Patient taking differently: Apply 2 g topically as needed. Rub into affected area of foot 2 to 4 times daily) 100 g 2   dicyclomine (BENTYL) 10 MG capsule TAKE 1 CAPSULE BY MOUTH TWICE DAILY FOR SPASMS 180 capsule 2   escitalopram (LEXAPRO) 5 MG tablet Take 1 tablet (5 mg total) by mouth daily. 90 tablet 3    famotidine (PEPCID) 20 MG tablet TAKE ONE TABLET EVERY DAY AS NEEDED FOR HEARTBURN OR INDIGESTION 90 tablet 3   Flaxseed, Linseed, (FLAX SEED OIL PO) Take by mouth. 2 a day     fluticasone (FLONASE) 50 MCG/ACT nasal spray SPRAY TWICE INTO EACH NOSTRIL ONCE DAILY 18 g 2   levothyroxine (SYNTHROID) 75 MCG tablet TAKE ONE TABLET BY MOUTH DAILY BEFORE BREAKFAST 90 tablet 3   Multiple Vitamin (MULTIVITAMIN) capsule Take 1 capsule by mouth daily.     Phenylephrine-APAP-guaiFENesin (TYLENOL SINUS SEVERE PO) Take by mouth.     Propylene Glycol (SYSTANE COMPLETE OP) Apply to eye.     No current facility-administered medications on file prior to visit.    Allergies  Allergen Reactions   Codeine Nausea And Vomiting   Fentanyl Nausea And Vomiting   Hydromorphone Nausea And Vomiting        Morphine Nausea And Vomiting   Ciprofloxacin     Intolerant- tingling    Tape     Surgical tape   Prednisone Palpitations    Rapid hear beat    Past Medical History:  Diagnosis Date   Allergy    Anxiety    Breast cancer (HCC) 7/12   Right   Cellulitis    Diverticulosis of colon    GERD (gastroesophageal reflux disease)    Headache  sinus   History of hiatal hernia    Hx of radiation therapy 06/20/11 - 07/11/11   right breast   Hypothyroid    Osteoporosis    Personal history of radiation therapy 2012   Right Breast Cancer   PONV (postoperative nausea and vomiting)    Skin cancer    basal and squamous cell   Vertigo     Past Surgical History:  Procedure Laterality Date   ABDOMINAL HYSTERECTOMY N/A 11/08/2016   Procedure: HYSTERECTOMY TOTAL  ABDOMINAL;  Surgeon: Marcelle Overlie, MD;  Location: [facility];  Service: Gynecology;  Laterality: N/A;   APPENDECTOMY  07/2007   back sugery  1970   BREAST BIOPSY Left    Benign   BREAST EXCISIONAL BIOPSY Left 1983   BREAST EXCISIONAL BIOPSY Left 1983   BREAST LUMPECTOMY Right 05/16/2011   COLONOSCOPY     COLONOSCOPY WITH PROPOFOL N/A 08/27/2018    Procedure: COLONOSCOPY WITH PROPOFOL;  Surgeon: Scot Jun, MD;  Location: [facility] ENDOSCOPY;  Service: Endoscopy;  Laterality: N/A;   COLONOSCOPY WITH PROPOFOL N/A 08/16/2021   Procedure: COLONOSCOPY WITH PROPOFOL;  Surgeon: Earline Mayotte, MD;  Location: [facility] ENDOSCOPY;  Service: Endoscopy;  Laterality: N/A;   DIAGNOSTIC LAPAROSCOPY     DILATION AND CURETTAGE OF UTERUS N/A 05/31/2014   Procedure: DILATATION AND CURETTAGE WITH ULTRASOUND GUIDANCE;  Surgeon: Jeani Hawking, MD;  Location: [facility];  Service: Gynecology;  Laterality: N/A;   ESOPHAGOGASTRODUODENOSCOPY  04/20/2003   KNEE SURGERY  1962   MASTECTOMY     right eye surgery     SALPINGOOPHORECTOMY Bilateral 11/08/2016   Procedure: BILATERAL SALPINGO OOPHORECTOMY;  Surgeon: Marcelle Overlie, MD;  Location: [facility];  Service: Gynecology;  Laterality: Bilateral;   UPPER GI ENDOSCOPY      Family History  Problem Relation Age of Onset   Atrial fibrillation Mother    Coronary artery disease Mother    Congenital heart disease Mother    Heart attack Father 52   Hypertension Father    Diabetes Maternal Aunt    Cancer Maternal Aunt        leukemia   Diabetes Maternal Uncle    Cancer Maternal Uncle        colon   Diabetes Other        Grandmother   Coronary artery disease Other        Grandmother   Uterine cancer Other        Grandmother   Leukemia Other        Aunt    Social History   Socioeconomic History   Marital status: Widowed    Spouse name: Not on file   Number of children: 1   Years of education: Not on file   Highest education level: Not on file  Occupational History   Occupation: insurance agency  Tobacco Use   Smoking status: Never   Smokeless tobacco: Never  Vaping Use   Vaping status: Never Used  Substance and Sexual Activity   Alcohol use: No    Alcohol/week: 0.0 standard drinks of alcohol   Drug use: No   Sexual activity: Not Currently    Birth control/protection: Post-menopausal  Other  Topics Concern   Not on file  Social History Narrative   Exercises on [employer] elliptical   Social Determinants of Health   Financial Resource Strain: Low Risk  (07/02/2023)   Overall Financial Resource Strain (CARDIA)    Difficulty of Paying Living Expenses: Not hard at all  Food Insecurity: No Food Insecurity (07/02/2023)   Hunger Vital Sign    Worried About Running Out of Food in the Last Year: Never true    Ran Out of Food in the Last Year: Never true  Transportation Needs: No Transportation Needs (07/02/2023)   PRAPARE - Administrator, Civil Service (Medical): No    Lack of Transportation (Non-Medical): No  Physical Activity: Patient Unable To Answer (07/02/2023)   Exercise Vital Sign    Days of Exercise per Week: Patient unable to answer    Minutes of Exercise per Session: Patient unable to answer  Stress: No Stress Concern Present (07/02/2023)   [employer] of Occupational Health - Occupational Stress Questionnaire    Feeling of Stress : Not at all  Social Connections: Moderately Isolated (07/02/2023)   Social Connection and Isolation Panel [NHANES]    Frequency of Communication with Friends and Family: More than three times a week    Frequency of Social Gatherings with Friends and Family: More than three times a week    Attends Religious Services: More than 4 times per year    Active Member of [employer] or Organizations: No    Attends Banker Meetings: Never    Marital Status: Widowed  Intimate Partner Violence: Not At Risk (07/02/2023)   Humiliation, Afraid, Rape, and Kick questionnaire    Fear of Current or Ex-Partner: No    Emotionally Abused: No    Physically Abused: No    Sexually Abused: No   Review of Systems No obvious weight loss No N/V      Objective:   Physical Exam Constitutional:      Appearance: Normal appearance.  Cardiovascular:     Rate and Rhythm: Normal rate and regular rhythm.     Heart sounds: No murmur heard.     No gallop.  Pulmonary:     Effort: Pulmonary effort is normal.     Breath sounds: Normal breath sounds. No wheezing or rales.  Abdominal:     General: Bowel sounds are normal.     Palpations: Abdomen is soft.     Comments: Marked LLQ tenderness with referred pain to there with RLQ palpation  Musculoskeletal:     Cervical back: Neck supple.     Right lower leg: No edema.     Left lower leg: No edema.  Lymphadenopathy:     Cervical: No cervical adenopathy.  Neurological:     Mental Status: She is alert.     Comments: Normal gait and strength            Assessment & Plan:

## 2023-08-21 NOTE — Assessment & Plan Note (Signed)
Very suggestive of diverticular disease Could have rupture that has walled off ---has sig tenderness and referred pain. Not overtly like peritonitis Will get CT abd/pelvis ASAP If negative, will need to proceed with GI evaluation

## 2023-08-21 NOTE — Addendum Note (Signed)
Addended by: Tillman Abide I on: 08/21/2023 12:23 PM   Modules accepted: Orders

## 2023-08-21 NOTE — Assessment & Plan Note (Signed)
Pain seems to be more abdominal than inguinal even though she points in that spot Could be radicular--but no indication that this is the issue

## 2023-08-21 NOTE — Therapy (Cosign Needed)
OUTPATIENT PHYSICAL THERAPY LOWER EXTREMITY EVALUATION   Patient Name: Gabrielle Webb MRN: [id number] DOB:1941/09/18, 82 y.o., female Today's Date: 08/22/2023  END OF SESSION:  PT End of Session - 08/21/23 1046     Visit Number 1    Number of Visits 13    Date for PT Re-Evaluation 10/02/23    PT Start Time 0945    PT Stop Time 1050    PT Time Calculation (min) 65 min    Activity Tolerance Patient tolerated treatment well    Behavior During Therapy [facility] for tasks assessed/performed             Past Medical History:  Diagnosis Date   Allergy    Anxiety    Breast cancer (HCC) 7/12   Right   Cellulitis    Diverticulosis of colon    GERD (gastroesophageal reflux disease)    Headache    sinus   History of hiatal hernia    Hx of radiation therapy 06/20/11 - 07/11/11   right breast   Hypothyroid    Osteoporosis    Personal history of radiation therapy 2012   Right Breast Cancer   PONV (postoperative nausea and vomiting)    Skin cancer    basal and squamous cell   Vertigo    Past Surgical History:  Procedure Laterality Date   ABDOMINAL HYSTERECTOMY N/A 11/08/2016   Procedure: HYSTERECTOMY TOTAL  ABDOMINAL;  Surgeon: Marcelle Overlie, MD;  Location: [facility];  Service: Gynecology;  Laterality: N/A;   APPENDECTOMY  07/2007   back sugery  1970   BREAST BIOPSY Left    Benign   BREAST EXCISIONAL BIOPSY Left 1983   BREAST EXCISIONAL BIOPSY Left 1983   BREAST LUMPECTOMY Right 05/16/2011   COLONOSCOPY     COLONOSCOPY WITH PROPOFOL N/A 08/27/2018   Procedure: COLONOSCOPY WITH PROPOFOL;  Surgeon: Scot Jun, MD;  Location: [facility] END[facility]COPY;  Service: Endoscopy;  Laterality: N/A;   COLONOSCOPY WITH PROPOFOL N/A 08/16/2021   Procedure: COLONOSCOPY WITH PROPOFOL;  Surgeon: Earline Mayotte, MD;  Location: [facility] ENDOSCOPY;  Service: Endoscopy;  Laterality: N/A;   DIAGNOSTIC LAPAROSCOPY     DILATION AND CURETTAGE OF UTERUS N/A 05/31/2014   Procedure: DILATATION AND  CURETTAGE WITH ULTRASOUND GUIDANCE;  Surgeon: Jeani Hawking, MD;  Location: [facility];  Service: Gynecology;  Laterality: N/A;   ESOPHAGOGASTRODUODENOSCOPY  04/20/2003   KNEE SURGERY  1962   MASTECTOMY     right eye surgery     SALPINGOOPHORECTOMY Bilateral 11/08/2016   Procedure: BILATERAL SALPINGO OOPHORECTOMY;  Surgeon: Marcelle Overlie, MD;  Location: [facility];  Service: Gynecology;  Laterality: Bilateral;   UPPER GI ENDOSCOPY     Patient Active Problem List   Diagnosis Date Noted   Left inguinal pain 08/21/2023   LLQ pain 08/21/2023   Normocytic anemia 07/09/2023   Post covid-19 condition, unspecified 07/05/2023   Dark stools 07/05/2023   Hypokalemia 06/30/2023   COVID-19 virus infection 06/20/2023   Skin lesion 05/02/2023   Right knee pain 04/24/2023   Vaginal pain 01/18/2023   Grief reaction 07/04/2022   Encounter for screening mammogram for breast cancer 12/18/2021   Anxious mood as adjustment reaction 06/09/2021   Breast lump on right side at 8 o'clock position 05/03/2020   History of breast cancer 05/03/2020   Medicare annual wellness visit, subsequent 05/28/2018   Estrogen deficiency 05/28/2018   Fatigue 05/22/2017   Chest discomfort 05/22/2017   S/P TAH-BSO 11/08/2016   Routine general medical examination at a  health care facility 05/06/2016   Malignant neoplasm of upper-outer quadrant of right breast in female, estrogen receptor positive (HCC) 03/21/2015   History of tamoxifen therapy 05/18/2014   Encounter for routine gynecological examination 05/06/2013   Colon cancer screening 05/06/2013   Encounter for Medicare annual wellness exam 04/28/2013   Hx of radiation therapy    Hx of Breast cancer, stage 1, Right, UOQ, Receptor+,Her2- 05/29/2011    Class: Stage 1   Other screening mammogram 03/21/2011   Post-menopausal 03/21/2011   Hypothyroidism 01/23/2008   Pure hypercholesterolemia 01/23/2008   ALLERGIC RHINITIS 01/23/2008   GERD 01/23/2008   DIVERTICULOSIS,  COLON 01/23/2008   IBS 01/23/2008   FIBROCYSTIC BREAST DISEASE 01/23/2008   Osteoporosis 10/17/2007    PCP: Judy Pimple MD  REFERRING PROVIDER: Marcelle Overlie, MD  REFERRING DIAG:  [phone] (ICD-10-CM) - Pain in right knee    THERAPY DIAG:  Muscle weakness (generalized)  Right knee pain, unspecified chronicity  Rationale for Evaluation and Treatment: Rehabilitation  ONSET DATE: April, 2024   SUBJECTIVE:   SUBJECTIVE STATEMENT: Pt is a pleasant 82 y/o F presenting to OPPT w/ sub-acute R knee pain. She reports an initial R knee injury requiring surgery after a basketball injury in 1962, however this is a new onset pain in the R knee.   R knee pain, bent to far at basketball, limiting activity, sharp pain, completely reduced activity, neighbor would get out and walking. Parking lot to the building type distances now.   PERTINENT HISTORY: Pt reports subacute R knee pain. This pain began following passive R knee flexion to end range during a pelvic PT visit, four months ago (April, 2024) and has continued to sustain the same level of pain since then. Pain reported at the anterior medial aspect of the R knee. Pain described as stabbing and sharp. Pt's pain with no activity is 0/10 NPS and 8-9/10 NPS with activity. Pt denies radicular symptoms in RLE, however does have LLE N/T. Pt has joint crepitus, popping and clicking in the knee joint. Pt's aggravating factors include prolonged walking, and stairs. The pain is relieved with a RLE knee sleeve, ice and OTC medications. Pt complaints of "pulling" when she flexes her knee and buckling of the knees 2/2 to weakness w/ ambulation.  PAIN:  Are you having pain? Yes: NPRS scale: 0/10 Pain location: Medial aspect of the anterior R knee Pain description: Sharp, stabbing pain  Aggravating factors: Prolonged walking, stairs  Relieving factors: Sleeve, OTC medications, ice  PRECAUTIONS: Fall  RED FLAGS: None   WEIGHT BEARING  RESTRICTIONS: No  FALLS:  Has patient fallen in last 6 months? No  No stairs  OCCUPATION: Retired  PLOF: Independent  PATIENT GOALS: Want to walk without pain and reduce knee buckling.   NEXT MD VISIT: N/A for the knee   OBJECTIVE:   DIAGNOSTIC FINDINGS:  EXAM: RIGHT KNEE - COMPLETE 4+ VIEW   COMPARISON:  None Available.   FINDINGS: No acute fracture or dislocation. Small joint effusion. Moderate to severe lateral and mild medial compartment joint space narrowing. Patellofemoral joint space is relatively preserved. Small tricompartmental marginal osteophytes. Osteopenia. Chondrocalcinosis of the menisci. Soft tissues are unremarkable.   Single AP view of the left knee is unremarkable, other than chondrocalcinosis of the menisci.   IMPRESSION: 1. Tricompartmental osteoarthritis, moderate to severe in the lateral compartment.  PATIENT SURVEYS:  FOTO 52/68  COGNITION: Overall cognitive status: Within functional limits for tasks assessed     SENSATION: [facility]  MUSCLE LENGTH: Hamstrings: Limited RLE/LLE <90 deg.    POSTURE:  Increased knee valgus of the R knee.   PALPATION: No TTP noted at the anterior or posterior med/ lat oint line   LOWER EXTREMITY ROM:  Active ROM Right eval Left eval  Knee flexion 100 120  Knee extension 4 0   (Blank rows = not tested)  LOWER EXTREMITY MMT:  MMT Right eval Left eval  Hip flexion 4- 4  Hip extension 2+ 2+  Hip abduction 4- 4-  Hip adduction 4- 4-  Hip internal rotation 4- 4  Hip external rotation 4 4  Knee flexion 4 4  Knee extension 4 4  Ankle dorsiflexion 4+ 4+  Ankle plantarflexion 4 4   (Blank rows = not tested)  LOWER EXTREMITY SPECIAL TESTS: (R) Knee special tests: Anterior drawer test: negative, Posterior drawer test: negative, and Valgus stress test: negative, Varus stress test: negative   FUNCTIONAL TESTS:  5 times sit to stand: Pt unable to perform 5x STS 2/2 R knee pain, defer to next  session   GAIT: Increased R knee valgus noted w/ ambulation.   JOINT MOBILITY: Mild R knee joint hypomobility felt in all planes. Typical R patellar movements noted w/ mobilizations.    TODAY'S TREATMENT:                                                                                                                              DATE: 08/21/23  THERE.EX: Reviewed HEP (reps/sets/frequency)   - Supine Heel Slide RLE x10 - Long Sitting Quad Set with Towel Roll Under Heel (Exercise d/c 2/2 no quad contraction noted in the RLE in this position)  - Seated LAQ RLE x10  - Mini Squat with Counter Support x10 - Sidelying Hip Abduction RLE/LLE x10  PATIENT EDUCATION:  Education details: HEP given to pt, prognosis Person educated: Patient Education method: Medical illustrator Education comprehension: verbalized understanding and returned demonstration  HOME EXERCISE PROGRAM: Access Code: [email or web address] URL: [email or web address] Date: 08/21/2023 Prepared by: Ronnie Derby  Exercises - Supine Heel Slide  - 1 x daily - 7 x weekly - 2 sets - 12 reps - Long Sitting Quad Set with Towel Roll Under Heel  - 1 x daily - 7 x weekly - 2 sets - 12 reps - 3 hold - Mini Squat with Counter Support  - 1 x daily - 3-4 x weekly - 3 sets - 10 reps - Sidelying Hip Abduction  - 1 x daily - 3-4 x weekly - 2 sets - 10 reps  ASSESSMENT:  CLINICAL IMPRESSION: Patient is an 82 y.o. F who was seen today for physical therapy evaluation and treatment for subacute R knee pain. Pt demonstrates mild joint hypomobility in all planes w/ typical patellar movement and decreased R knee flexion AROM (100 deg) and lacking terminal knee extension (4 deg). Pt presents with deficits including R knee valgus, proximal hip weakness, and painful WB with prolonged  walking and stair negotiation. No TTP noted w/ palpation, however pt identifies pain at the medial aspect of the R knee. MCL, ACL, PCL, and LCL testing of  the R knee resulted in negative w/ no concordant pain. Further testing next session to include tests/measure involving meniscal testing and other possible knee pathologies. These deficits are limiting participation in prolonged walking ADL's and stair negotiation due to moderate pain levels. Pt will benefit from skilled PT services to address these deficits to reduce pain and maximize functional capacity with walking, steps, and household ADL's.  OBJECTIVE IMPAIRMENTS: decreased balance, decreased mobility, difficulty walking, decreased ROM, decreased strength, impaired flexibility, and pain.   ACTIVITY LIMITATIONS: carrying, lifting, squatting, and locomotion level  PARTICIPATION LIMITATIONS: community activity  PERSONAL FACTORS: Age, Past/current experiences, Time since onset of injury/illness/exacerbation, and 1 comorbidity: Osteoporosis  are also affecting patient's functional outcome.   REHAB POTENTIAL: Good  CLINICAL DECISION MAKING: Stable/uncomplicated  EVALUATION COMPLEXITY: Low   GOALS: Goals reviewed with patient? Yes  SHORT TERM GOALS: Target date: 09/17/23 Pt will be independent with HEP to improve R knee strength and decrease pain with functional activities  Baseline: 08/21/23: HEP given to pt  Goal status: INITIAL   LONG TERM GOALS: Target date: 10/02/23  Pt will improve FOTO to target score to demonstrate clinically significant improvement in functional mobility.  Baseline: 08/21/23: 52/68 Goal status: INITIAL  2.  Pt will improve 5xSTS to </= 14.8 seconds to display improvements in LE strength and equal age- matched norms.  Baseline: 08/21/23: Deferred to next session  Goal status: INITIAL  3.  Pt will report <6/10 pain in R knee w/ prolonged ambulation to display clinically significant pain improvement. Baseline:08/21/23: 8/10 Goal status: INITIAL  4. Pt will improve R knee flexion AROM to 120 deg and R knee extension to 0 deg to be equal to L knee AROM for  improved functional mobility.   Baseline: 08/21/23: Active ROM Right eval Left eval  Knee flexion 100 120  Knee extension 4 0   (Blank rows = not tested)  Goal status: INITIAL     PLAN:  PT FREQUENCY: 2x/week  PT DURATION: 6 weeks  PLANNED INTERVENTIONS: Therapeutic exercises, Therapeutic activity, Neuromuscular re-education, Balance training, Gait training, Patient/Family education, Self Care, Joint mobilization, Joint manipulation, Stair training, Spinal manipulation, Spinal mobilization, Cryotherapy, Moist heat, Manual therapy, and Re-evaluation  PLAN FOR NEXT SESSION: Review HEP, meniscal special testing  Lovie Macadamia, SPT  Delphia Grates. Fairly IV, PT, DPT Physical Therapist- [facility]  08/22/2023, 8:49 AM

## 2023-08-22 ENCOUNTER — Ambulatory Visit
Admission: RE | Admit: 2023-08-22 | Discharge: 2023-08-22 | Disposition: A | Discharge status: Home / Self Care | Payer: PPO | Source: Ambulatory Visit | Attending: Internal Medicine | Admitting: Internal Medicine

## 2023-08-22 DIAGNOSIS — R1032 Left lower quadrant pain: Secondary | ICD-10-CM | POA: Insufficient documentation

## 2023-08-22 DIAGNOSIS — K5732 Diverticulitis of large intestine without perforation or abscess without bleeding: Secondary | ICD-10-CM | POA: Diagnosis not present

## 2023-08-22 DIAGNOSIS — K5900 Constipation, unspecified: Secondary | ICD-10-CM | POA: Diagnosis not present

## 2023-08-22 NOTE — Addendum Note (Signed)
Addended by: Georgia Duff IV on: 08/22/2023 08:53 AM   Modules accepted: Orders

## 2023-08-23 ENCOUNTER — Telehealth: Payer: Self-pay | Admitting: Family Medicine

## 2023-08-23 ENCOUNTER — Ambulatory Visit: Payer: PPO

## 2023-08-23 MED ORDER — AMOXICILLIN-POT CLAVULANATE 875-125 MG PO TABS: 1.0000 | ORAL_TABLET | Freq: Two times a day (BID) | ORAL | 0 refills | Status: DC

## 2023-08-23 NOTE — Telephone Encounter (Signed)
I didn't leave her a VM, I have already spoke with PCP regarding what Dr. Alphonsus Sias ask me to tell her

## 2023-08-23 NOTE — Telephone Encounter (Signed)
Pt is returning your call. Patient said you left her a message

## 2023-08-23 NOTE — Telephone Encounter (Signed)
Pt would like for you to call her about her cat scan

## 2023-08-23 NOTE — Telephone Encounter (Signed)
Dr. Milinda Antis didn't order scan and she isn't in office, will route to provider who ordered it

## 2023-08-23 NOTE — Telephone Encounter (Signed)
Pt notified of Dr. Karle Starch comments and Rx sent to pharmacy. Pt will keeps [name] posted

## 2023-08-24 ENCOUNTER — Encounter (HOSPITAL_COMMUNITY): Payer: Self-pay

## 2023-08-28 ENCOUNTER — Telehealth: Payer: Self-pay | Admitting: Family Medicine

## 2023-08-28 NOTE — Telephone Encounter (Signed)
Pt called stating since she cannot any hard foods so she hasn't had a bowel movement since last Mon, 9/23. Pt asked was there anything she can take to make her have a movement? Call back # [phone]

## 2023-08-29 NOTE — Telephone Encounter (Signed)
First of all how is pain now that she has started the antibiotic for diverticulitis ? Improving yet? Any fever or n/v?   (I reviewed note from Dr Alphonsus Sias and CT report)   I would start with miralax (mixed with water or other clear fluid) 1-3 times daily until bowels start to move  It is over the counter It takes a few days to start working  No surprise that without eating she has less bms  The augmentin may end up giving her a little loose stool also (some people notice this )   Thakns for the heads up

## 2023-08-29 NOTE — Telephone Encounter (Signed)
Pt said sxs of diverticulitis have improved a lot the pain is almost gone and she has no other sxs except the constipation. Pt advised of PCP's instructions and verbalized understanding. She will get the Miralax and keep [name] posted

## 2023-08-30 ENCOUNTER — Ambulatory Visit: Payer: PPO

## 2023-09-04 DIAGNOSIS — Z20822 Contact with and (suspected) exposure to covid-19: Secondary | ICD-10-CM | POA: Diagnosis not present

## 2023-09-09 ENCOUNTER — Ambulatory Visit: Payer: PPO | Attending: Obstetrics and Gynecology

## 2023-09-09 DIAGNOSIS — M6281 Muscle weakness (generalized): Secondary | ICD-10-CM | POA: Insufficient documentation

## 2023-09-09 DIAGNOSIS — M25561 Pain in right knee: Secondary | ICD-10-CM | POA: Diagnosis not present

## 2023-09-09 NOTE — Therapy (Addendum)
OUTPATIENT PHYSICAL THERAPY LOWER EXTREMITY TREATMENT   Patient Name: Gabrielle Webb MRN: [id number] DOB:12/13/1940, 82 y.o., female Today's Date: 09/09/2023  END OF SESSION:  PT End of Session - 09/09/23 0859     Visit Number 2    Number of Visits 13    Date for PT Re-Evaluation 10/02/23    PT Start Time 0900    PT Stop Time 0941    PT Time Calculation (min) 41 min    Activity Tolerance Patient tolerated treatment well    Behavior During Therapy [facility] for tasks assessed/performed             Past Medical History:  Diagnosis Date   Allergy    Anxiety    Breast cancer (HCC) 7/12   Right   Cellulitis    Diverticulosis of colon    GERD (gastroesophageal reflux disease)    Headache    sinus   History of hiatal hernia    Hx of radiation therapy 06/20/11 - 07/11/11   right breast   Hypothyroid    Osteoporosis    Personal history of radiation therapy 2012   Right Breast Cancer   PONV (postoperative nausea and vomiting)    Skin cancer    basal and squamous cell   Vertigo    Past Surgical History:  Procedure Laterality Date   ABDOMINAL HYSTERECTOMY N/A 11/08/2016   Procedure: HYSTERECTOMY TOTAL  ABDOMINAL;  Surgeon: Marcelle Overlie, MD;  Location: [facility];  Service: Gynecology;  Laterality: N/A;   APPENDECTOMY  07/2007   back sugery  1970   BREAST BIOPSY Left    Benign   BREAST EXCISIONAL BIOPSY Left 1983   BREAST EXCISIONAL BIOPSY Left 1983   BREAST LUMPECTOMY Right 05/16/2011   COLONOSCOPY     COLONOSCOPY WITH PROPOFOL N/A 08/27/2018   Procedure: COLONOSCOPY WITH PROPOFOL;  Surgeon: Scot Jun, MD;  Location: [facility] ENDOSCOPY;  Service: Endoscopy;  Laterality: N/A;   COLONOSCOPY WITH PROPOFOL N/A 08/16/2021   Procedure: COLONOSCOPY WITH PROPOFOL;  Surgeon: Earline Mayotte, MD;  Location: [facility] ENDOSCOPY;  Service: Endoscopy;  Laterality: N/A;   DIAGNOSTIC LAPAROSCOPY     DILATION AND CURETTAGE OF UTERUS N/A 05/31/2014   Procedure: DILATATION AND CURETTAGE  WITH ULTRASOUND GUIDANCE;  Surgeon: Jeani Hawking, MD;  Location: [facility];  Service: Gynecology;  Laterality: N/A;   ESOPHAGOGASTRODUODENOSCOPY  04/20/2003   KNEE SURGERY  1962   MASTECTOMY     right eye surgery     SALPINGOOPHORECTOMY Bilateral 11/08/2016   Procedure: BILATERAL SALPINGO OOPHORECTOMY;  Surgeon: Marcelle Overlie, MD;  Location: [facility];  Service: Gynecology;  Laterality: Bilateral;   UPPER GI ENDOSCOPY     Patient Active Problem List   Diagnosis Date Noted   Left inguinal pain 08/21/2023   LLQ pain 08/21/2023   Normocytic anemia 07/09/2023   Post covid-19 condition, unspecified 07/05/2023   Dark stools 07/05/2023   Hypokalemia 06/30/2023   COVID-19 virus infection 06/20/2023   Skin lesion 05/02/2023   Right knee pain 04/24/2023   Vaginal pain 01/18/2023   Grief reaction 07/04/2022   Encounter for screening mammogram for breast cancer 12/18/2021   Anxious mood as adjustment reaction 06/09/2021   Breast lump on right side at 8 o'clock position 05/03/2020   History of breast cancer 05/03/2020   Medicare annual wellness visit, subsequent 05/28/2018   Estrogen deficiency 05/28/2018   Fatigue 05/22/2017   Chest discomfort 05/22/2017   S/P TAH-BSO 11/08/2016   Routine general medical examination at a  health care facility 05/06/2016   Malignant neoplasm of upper-outer quadrant of right breast in female, estrogen receptor positive (HCC) 03/21/2015   History of tamoxifen therapy 05/18/2014   Encounter for routine gynecological examination 05/06/2013   Colon cancer screening 05/06/2013   Encounter for Medicare annual wellness exam 04/28/2013   Hx of radiation therapy    Hx of Breast cancer, stage 1, Right, UOQ, Receptor+,Her2- 05/29/2011    Class: Stage 1   Other screening mammogram 03/21/2011   Post-menopausal 03/21/2011   Hypothyroidism 01/23/2008   Pure hypercholesterolemia 01/23/2008   Allergic rhinitis 01/23/2008   GERD 01/23/2008   Diverticulosis of colon  01/23/2008   IBS 01/23/2008   FIBROCYSTIC BREAST DISEASE 01/23/2008   Osteoporosis 10/17/2007    PCP: Judy Pimple MD  REFERRING PROVIDER: Marcelle Overlie, MD  REFERRING DIAG:  [phone] (ICD-10-CM) - Pain in right knee    THERAPY DIAG:  Right knee pain, unspecified chronicity  Muscle weakness (generalized)  Rationale for Evaluation and Treatment: Rehabilitation  ONSET DATE: April, 2024   SUBJECTIVE:   SUBJECTIVE STATEMENT: Pt reports no R knee pain at today's session. Pt reports being HEP compliant and only having difficulty with SLR exercise.   PERTINENT HISTORY: Pt reports subacute R knee pain. This pain began following passive R knee flexion to end range during a pelvic PT visit, four months ago (April, 2024) and has continued to sustain the same level of pain since then. Pain reported at the anterior medial aspect of the R knee. Pain described as stabbing and sharp. Pt's pain with no activity is 0/10 NPS and 8-9/10 NPS with activity. Pt denies radicular symptoms in RLE, however does have LLE N/T. Pt has joint crepitus, popping and clicking in the knee joint. Pt's aggravating factors include prolonged walking, and stairs. The pain is relieved with a RLE knee sleeve, ice and OTC medications. Pt complaints of "pulling" when she flexes her knee and buckling of the knees 2/2 to weakness w/ ambulation.  PAIN:  Are you having pain? Yes: NPRS scale: 0/10 Pain location: Medial aspect of the anterior R knee Pain description: Sharp, stabbing pain  Aggravating factors: Prolonged walking, stairs  Relieving factors: Sleeve, OTC medications, ice  PRECAUTIONS: Fall  RED FLAGS: None   WEIGHT BEARING RESTRICTIONS: No  FALLS:  Has patient fallen in last 6 months? No  No stairs  OCCUPATION: Retired  PLOF: Independent  PATIENT GOALS: Want to walk without pain and reduce knee buckling.   NEXT MD VISIT: N/A for the knee   OBJECTIVE:   DIAGNOSTIC FINDINGS:  EXAM: RIGHT KNEE  - COMPLETE 4+ VIEW   COMPARISON:  None Available.   FINDINGS: No acute fracture or dislocation. Small joint effusion. Moderate to severe lateral and mild medial compartment joint space narrowing. Patellofemoral joint space is relatively preserved. Small tricompartmental marginal osteophytes. Osteopenia. Chondrocalcinosis of the menisci. Soft tissues are unremarkable.   Single AP view of the left knee is unremarkable, other than chondrocalcinosis of the menisci.   IMPRESSION: 1. Tricompartmental osteoarthritis, moderate to severe in the lateral compartment.  PATIENT SURVEYS:  FOTO 52/68  COGNITION: Overall cognitive status: Within functional limits for tasks assessed     SENSATION: WFL   MUSCLE LENGTH: Hamstrings: Limited RLE/LLE <90 deg.    POSTURE:  Increased knee valgus of the R knee.   PALPATION: No TTP noted at the anterior or posterior med/ lat oint line   LOWER EXTREMITY ROM:  Active ROM Right eval Left eval  Knee flexion 100  120  Knee extension 4 0   (Blank rows = not tested)  LOWER EXTREMITY MMT:  MMT Right eval Left eval  Hip flexion 4- 4  Hip extension 2+ 2+  Hip abduction 4- 4-  Hip adduction 4- 4-  Hip internal rotation 4- 4  Hip external rotation 4 4  Knee flexion 4 4  Knee extension 4 4  Ankle dorsiflexion 4+ 4+  Ankle plantarflexion 4 4   (Blank rows = not tested)  LOWER EXTREMITY SPECIAL TESTS: (R) Knee special tests: Anterior drawer test: negative, Posterior drawer test: negative, and Valgus stress test: negative, Varus stress test: negative   FUNCTIONAL TESTS:  5 times sit to stand: Pt unable to perform 5x STS 2/2 R knee pain, defer to next session   GAIT: Increased R knee valgus noted w/ ambulation.   JOINT MOBILITY: Mild R knee joint hypomobility felt in all planes. Typical R patellar movements noted w/ mobilizations.    TODAY'S TREATMENT:                                                                                                                               DATE: 09/09/23  THERE.EX:  Nustep x5 minutes for LE strengthening and mobility   Reviewed HEP (reps/sets/frequency)   - Supine Heel Slide RLE x12  - Seated LAQ RLE x12  - Seated LAQ of RLE w/ RTB around bilat ankles x8  - Mini Squat with Counter Support 2 x10 - Standing Hip Abduction RLE/LLE 2 x10 w/ bilat UE support - Standing Hip Extension RLE/LLE 2 x10 w/ bilat UE support  - Seated hamstring curl w/ GTB RLE x10 w/ bilat UE support   Standing alternating lunges w/ bilat UE support RLE/LLE x10/ each side  Standing bilat gastroc stretch at slant board RLE/LLE x30 sec  Standing gastroc stretch at wall RLE x30 sec   PATIENT EDUCATION:  Education details: HEP given to pt, prognosis Person educated: Patient Education method: Medical illustrator Education comprehension: verbalized understanding and returned demonstration  HOME EXERCISE PROGRAM: Access Code: [email or web address] URL: [email or web address] Date: 08/21/2023 Prepared by: Ronnie Derby  Exercises - Supine Heel Slide  - 1 x daily - 7 x weekly - 2 sets - 12 reps - Long Sitting Quad Set with Towel Roll Under Heel  - 1 x daily - 7 x weekly - 2 sets - 12 reps - 3 hold - Mini Squat with Counter Support  - 1 x daily - 3-4 x weekly - 3 sets - 10 reps - Sidelying Hip Abduction  - 1 x daily - 3-4 x weekly - 2 sets - 10 reps  ASSESSMENT:  CLINICAL IMPRESSION: Session focused on reviewing and updating pt's HEP along w/ progressing pt's LE strengthening exercises. Pt notes reduction in RLE knee pain and progression in her HEP warranting updates to her home exercises. Pt tolerated these updates well, verbalizing and demonstrating comprehension of exercises. Pt continues to note mild knee  pain with activities, and would continue to benefit from skilled PT services to address these deficits to reduce pain and maximize functional capacity with walking, steps, and household  ADL's.  OBJECTIVE IMPAIRMENTS: decreased balance, decreased mobility, difficulty walking, decreased ROM, decreased strength, impaired flexibility, and pain.   ACTIVITY LIMITATIONS: carrying, lifting, squatting, and locomotion level  PARTICIPATION LIMITATIONS: community activity  PERSONAL FACTORS: Age, Past/current experiences, Time since onset of injury/illness/exacerbation, and 1 comorbidity: Osteoporosis  are also affecting patient's functional outcome.   REHAB POTENTIAL: Good  CLINICAL DECISION MAKING: Stable/uncomplicated  EVALUATION COMPLEXITY: Low   GOALS: Goals reviewed with patient? Yes  SHORT TERM GOALS: Target date: 09/17/23 Pt will be independent with HEP to improve R knee strength and decrease pain with functional activities  Baseline: 08/21/23: HEP given to pt  Goal status: INITIAL   LONG TERM GOALS: Target date: 10/02/23  Pt will improve FOTO to target score to demonstrate clinically significant improvement in functional mobility.  Baseline: 08/21/23: 52/68 Goal status: INITIAL  2.  Pt will improve 5xSTS to </= 14.8 seconds to display improvements in LE strength and equal age- matched norms.  Baseline: 08/21/23: Deferred to next session  Goal status: INITIAL  3.  Pt will report <6/10 pain in R knee w/ prolonged ambulation to display clinically significant pain improvement. Baseline:08/21/23: 8/10 Goal status: INITIAL  4. Pt will improve R knee flexion AROM to 120 deg and R knee extension to 0 deg to be equal to L knee AROM for improved functional mobility.   Baseline: 08/21/23: Active ROM Right eval Left eval  Knee flexion 100 120  Knee extension 4 0   (Blank rows = not tested)  Goal status: INITIAL     PLAN:  PT FREQUENCY: 2x/week  PT DURATION: 6 weeks  PLANNED INTERVENTIONS: Therapeutic exercises, Therapeutic activity, Neuromuscular re-education, Balance training, Gait training, Patient/Family education, Self Care, Joint mobilization, Joint  manipulation, Stair training, Spinal manipulation, Spinal mobilization, Cryotherapy, Moist heat, Manual therapy, and Re-evaluation  PLAN FOR NEXT SESSION: Assess tolerance to HEP updates continue RLE strengthening.   Lovie Macadamia, SPT   Delphia Grates. Fairly IV, PT, DPT Physical Therapist-   [facility]  09/09/2023, 1:13 PM

## 2023-09-11 ENCOUNTER — Ambulatory Visit: Payer: PPO

## 2023-09-11 DIAGNOSIS — M6281 Muscle weakness (generalized): Secondary | ICD-10-CM

## 2023-09-11 DIAGNOSIS — M25561 Pain in right knee: Secondary | ICD-10-CM

## 2023-09-11 NOTE — Therapy (Addendum)
OUTPATIENT PHYSICAL THERAPY LOWER EXTREMITY TREATMENT   Patient Name: Gabrielle Webb MRN: [id number] DOB:09-23-1941, 82 y.o., female Today's Date: 09/11/2023  END OF SESSION:  PT End of Session - 09/11/23 0901     Visit Number 3    Number of Visits 13    Date for PT Re-Evaluation 10/02/23    PT Start Time 0902    PT Stop Time 0945    PT Time Calculation (min) 43 min    Activity Tolerance Patient tolerated treatment well    Behavior During Therapy [facility] for tasks assessed/performed             Past Medical History:  Diagnosis Date   Allergy    Anxiety    Breast cancer (HCC) 7/12   Right   Cellulitis    Diverticulosis of colon    GERD (gastroesophageal reflux disease)    Headache    sinus   History of hiatal hernia    Hx of radiation therapy 06/20/11 - 07/11/11   right breast   Hypothyroid    Osteoporosis    Personal history of radiation therapy 2012   Right Breast Cancer   PONV (postoperative nausea and vomiting)    Skin cancer    basal and squamous cell   Vertigo    Past Surgical History:  Procedure Laterality Date   ABDOMINAL HYSTERECTOMY N/A 11/08/2016   Procedure: HYSTERECTOMY TOTAL  ABDOMINAL;  Surgeon: Marcelle Overlie, MD;  Location: [facility];  Service: Gynecology;  Laterality: N/A;   APPENDECTOMY  07/2007   back sugery  1970   BREAST BIOPSY Left    Benign   BREAST EXCISIONAL BIOPSY Left 1983   BREAST EXCISIONAL BIOPSY Left 1983   BREAST LUMPECTOMY Right 05/16/2011   COLONOSCOPY     COLONOSCOPY WITH PROPOFOL N/A 08/27/2018   Procedure: COLONOSCOPY WITH PROPOFOL;  Surgeon: Scot Jun, MD;  Location: [facility] ENDOSCOPY;  Service: Endoscopy;  Laterality: N/A;   COLONOSCOPY WITH PROPOFOL N/A 08/16/2021   Procedure: COLONOSCOPY WITH PROPOFOL;  Surgeon: Earline Mayotte, MD;  Location: [facility] ENDOSCOPY;  Service: Endoscopy;  Laterality: N/A;   DIAGNOSTIC LAPAROSCOPY     DILATION AND CURETTAGE OF UTERUS N/A 05/31/2014   Procedure: DILATATION AND CURETTAGE  WITH ULTRASOUND GUIDANCE;  Surgeon: Jeani Hawking, MD;  Location: [facility];  Service: Gynecology;  Laterality: N/A;   ESOPHAGOGASTRODUODENOSCOPY  04/20/2003   KNEE SURGERY  1962   MASTECTOMY     right eye surgery     SALPINGOOPHORECTOMY Bilateral 11/08/2016   Procedure: BILATERAL SALPINGO OOPHORECTOMY;  Surgeon: Marcelle Overlie, MD;  Location: [facility];  Service: Gynecology;  Laterality: Bilateral;   UPPER GI ENDOSCOPY     Patient Active Problem List   Diagnosis Date Noted   Left inguinal pain 08/21/2023   LLQ pain 08/21/2023   Normocytic anemia 07/09/2023   Post covid-19 condition, unspecified 07/05/2023   Dark stools 07/05/2023   Hypokalemia 06/30/2023   COVID-19 virus infection 06/20/2023   Skin lesion 05/02/2023   Right knee pain 04/24/2023   Vaginal pain 01/18/2023   Grief reaction 07/04/2022   Encounter for screening mammogram for breast cancer 12/18/2021   Anxious mood as adjustment reaction 06/09/2021   Breast lump on right side at 8 o'clock position 05/03/2020   History of breast cancer 05/03/2020   Medicare annual wellness visit, subsequent 05/28/2018   Estrogen deficiency 05/28/2018   Fatigue 05/22/2017   Chest discomfort 05/22/2017   S/P TAH-BSO 11/08/2016   Routine general medical examination at a  health care facility 05/06/2016   Malignant neoplasm of upper-outer quadrant of right breast in female, estrogen receptor positive (HCC) 03/21/2015   History of tamoxifen therapy 05/18/2014   Encounter for routine gynecological examination 05/06/2013   Colon cancer screening 05/06/2013   Encounter for Medicare annual wellness exam 04/28/2013   Hx of radiation therapy    Hx of Breast cancer, stage 1, Right, UOQ, Receptor+,Her2- 05/29/2011    Class: Stage 1   Other screening mammogram 03/21/2011   Post-menopausal 03/21/2011   Hypothyroidism 01/23/2008   Pure hypercholesterolemia 01/23/2008   Allergic rhinitis 01/23/2008   GERD 01/23/2008   Diverticulosis of colon  01/23/2008   IBS 01/23/2008   FIBROCYSTIC BREAST DISEASE 01/23/2008   Osteoporosis 10/17/2007    PCP: Judy Pimple MD  REFERRING PROVIDER: Marcelle Overlie, MD  REFERRING DIAG:  [phone] (ICD-10-CM) - Pain in right knee    THERAPY DIAG:  Right knee pain, unspecified chronicity  Muscle weakness (generalized)  Rationale for Evaluation and Treatment: Rehabilitation  ONSET DATE: April, 2024   SUBJECTIVE:   SUBJECTIVE STATEMENT: Pt reports no R knee pain at today's session. Pt reports some lateral R knee pain with her HEP specifically with mini squats.   PERTINENT HISTORY: Pt reports subacute R knee pain. This pain began following passive R knee flexion to end range during a pelvic PT visit, four months ago (April, 2024) and has continued to sustain the same level of pain since then. Pain reported at the anterior medial aspect of the R knee. Pain described as stabbing and sharp. Pt's pain with no activity is 0/10 NPS and 8-9/10 NPS with activity. Pt denies radicular symptoms in RLE, however does have LLE N/T. Pt has joint crepitus, popping and clicking in the knee joint. Pt's aggravating factors include prolonged walking, and stairs. The pain is relieved with a RLE knee sleeve, ice and OTC medications. Pt complaints of "pulling" when she flexes her knee and buckling of the knees 2/2 to weakness w/ ambulation.  PAIN:  Are you having pain? Yes: NPRS scale: 0/10 Pain location: Medial aspect of the anterior R knee Pain description: Sharp, stabbing pain  Aggravating factors: Prolonged walking, stairs  Relieving factors: Sleeve, OTC medications, ice  PRECAUTIONS: Fall  RED FLAGS: None   WEIGHT BEARING RESTRICTIONS: No  FALLS:  Has patient fallen in last 6 months? No  No stairs  OCCUPATION: Retired  PLOF: Independent  PATIENT GOALS: Want to walk without pain and reduce knee buckling.   NEXT MD VISIT: N/A for the knee   OBJECTIVE:   DIAGNOSTIC FINDINGS:  EXAM: RIGHT  KNEE - COMPLETE 4+ VIEW   COMPARISON:  None Available.   FINDINGS: No acute fracture or dislocation. Small joint effusion. Moderate to severe lateral and mild medial compartment joint space narrowing. Patellofemoral joint space is relatively preserved. Small tricompartmental marginal osteophytes. Osteopenia. Chondrocalcinosis of the menisci. Soft tissues are unremarkable.   Single AP view of the left knee is unremarkable, other than chondrocalcinosis of the menisci.   IMPRESSION: 1. Tricompartmental osteoarthritis, moderate to severe in the lateral compartment.  PATIENT SURVEYS:  FOTO 52/68  COGNITION: Overall cognitive status: Within functional limits for tasks assessed     SENSATION: WFL   MUSCLE LENGTH: Hamstrings: Limited RLE/LLE <90 deg.    POSTURE:  Increased knee valgus of the R knee.   PALPATION: No TTP noted at the anterior or posterior med/ lat oint line   LOWER EXTREMITY ROM:  Active ROM Right eval Left eval  Knee  flexion 100 120  Knee extension 4 0   (Blank rows = not tested)  LOWER EXTREMITY MMT:  MMT Right eval Left eval  Hip flexion 4- 4  Hip extension 2+ 2+  Hip abduction 4- 4-  Hip adduction 4- 4-  Hip internal rotation 4- 4  Hip external rotation 4 4  Knee flexion 4 4  Knee extension 4 4  Ankle dorsiflexion 4+ 4+  Ankle plantarflexion 4 4   (Blank rows = not tested)  LOWER EXTREMITY SPECIAL TESTS: (R) Knee special tests: Anterior drawer test: negative, Posterior drawer test: negative, and Valgus stress test: negative, Varus stress test: negative   FUNCTIONAL TESTS:  5 times sit to stand: Pt unable to perform 5x STS 2/2 R knee pain, defer to next session   GAIT: Increased R knee valgus noted w/ ambulation.   JOINT MOBILITY: Mild R knee joint hypomobility felt in all planes. Typical R patellar movements noted w/ mobilizations.    TODAY'S TREATMENT:                                                                                                                               DATE: 09/11/23  Therapeutic Exercise: Nustep x5 minutes for LE strengthening and mobility  Lateral stepping w/ GTB 2 x10' down and back Standing gastroc stretch at wall RLE/LLE x30 sec each side Standing squats onto elevated plinth w/ 2 KG ball 3 x10. Notable weight shift onto her LLE to offset WB'ing on her RLE.  Standing staggered squats onto elevated plinth w/ 2KG ball 2 x10 (RLE back/ LLE back)  Seated hamstring curl w/ GTB 3 x12 RLE only Seated knee extension w/ GTB 3 x12 RLE only Standing lunges at second step w/ bilat UE support x10 RLE only  PATIENT EDUCATION:  Education details: HEP given to pt, prognosis Person educated: Patient Education method: Medical illustrator Education comprehension: verbalized understanding and returned demonstration  HOME EXERCISE PROGRAM: Access Code: [email or web address] URL: [email or web address] Date: 08/21/2023 Prepared by: Ronnie Derby  Exercises - Supine Heel Slide  - 1 x daily - 7 x weekly - 2 sets - 12 reps - Long Sitting Quad Set with Towel Roll Under Heel  - 1 x daily - 7 x weekly - 2 sets - 12 reps - 3 hold - Mini Squat with Counter Support  - 1 x daily - 3-4 x weekly - 3 sets - 10 reps - Sidelying Hip Abduction  - 1 x daily - 3-4 x weekly - 2 sets - 10 reps  ASSESSMENT:  CLINICAL IMPRESSION:  Session focused on progressing RLE strengthening exercises emphasizing knee flexion/ extension. Pt continues to note progression in RLE strength and reduced pain levels noted w/ increased activity tolerance during exercises. Pt self reports mild lateral R knee pain with CKC movements,noted during her HEP exercises primarily involving knee flexion. Pt tends to favor LLE over RLE w/ mini squat exercise leading to staggered stance modification to  enhance RLE Wb'ing, good carryover noted. Pt would continue to benefit from skilled PT services to address these deficits to reduce pain and maximize  functional capacity with walking, steps, and household ADL's.  OBJECTIVE IMPAIRMENTS: decreased balance, decreased mobility, difficulty walking, decreased ROM, decreased strength, impaired flexibility, and pain.   ACTIVITY LIMITATIONS: carrying, lifting, squatting, and locomotion level  PARTICIPATION LIMITATIONS: community activity  PERSONAL FACTORS: Age, Past/current experiences, Time since onset of injury/illness/exacerbation, and 1 comorbidity: Osteoporosis  are also affecting patient's functional outcome.   REHAB POTENTIAL: Good  CLINICAL DECISION MAKING: Stable/uncomplicated  EVALUATION COMPLEXITY: Low   GOALS: Goals reviewed with patient? Yes  SHORT TERM GOALS: Target date: 09/17/23 Pt will be independent with HEP to improve R knee strength and decrease pain with functional activities  Baseline: 08/21/23: HEP given to pt  Goal status: INITIAL   LONG TERM GOALS: Target date: 10/02/23  Pt will improve FOTO to target score to demonstrate clinically significant improvement in functional mobility.  Baseline: 08/21/23: 52/68 Goal status: INITIAL  2.  Pt will improve 5xSTS to </= 14.8 seconds to display improvements in LE strength and equal age- matched norms.  Baseline: 08/21/23: Deferred to next session  Goal status: INITIAL  3.  Pt will report <6/10 pain in R knee w/ prolonged ambulation to display clinically significant pain improvement. Baseline:08/21/23: 8/10 Goal status: INITIAL  4. Pt will improve R knee flexion AROM to 120 deg and R knee extension to 0 deg to be equal to L knee AROM for improved functional mobility.   Baseline: 08/21/23: Active ROM Right eval Left eval  Knee flexion 100 120  Knee extension 4 0   (Blank rows = not tested)  Goal status: INITIAL     PLAN:  PT FREQUENCY: 2x/week  PT DURATION: 6 weeks  PLANNED INTERVENTIONS: Therapeutic exercises, Therapeutic activity, Neuromuscular re-education, Balance training, Gait training, Patient/Family  education, Self Care, Joint mobilization, Joint manipulation, Stair training, Spinal manipulation, Spinal mobilization, Cryotherapy, Moist heat, Manual therapy, and Re-evaluation  PLAN FOR NEXT SESSION: Continue RLE strengthening and CKC exercises. Update HEP.  Lovie Macadamia, SPT   Delphia Grates. Fairly IV, PT, DPT Physical Therapist- [place]  [facility]  09/11/2023, 10:41 AM

## 2023-09-12 DIAGNOSIS — H0289 Other specified disorders of eyelid: Secondary | ICD-10-CM | POA: Diagnosis not present

## 2023-09-12 DIAGNOSIS — Z961 Presence of intraocular lens: Secondary | ICD-10-CM | POA: Diagnosis not present

## 2023-09-12 DIAGNOSIS — H04123 Dry eye syndrome of bilateral lacrimal glands: Secondary | ICD-10-CM | POA: Diagnosis not present

## 2023-09-12 DIAGNOSIS — E0500 Thyrotoxicosis with diffuse goiter without thyrotoxic crisis or storm: Secondary | ICD-10-CM | POA: Diagnosis not present

## 2023-09-18 ENCOUNTER — Ambulatory Visit: Payer: PPO

## 2023-09-18 DIAGNOSIS — M25561 Pain in right knee: Secondary | ICD-10-CM | POA: Diagnosis not present

## 2023-09-18 DIAGNOSIS — M6281 Muscle weakness (generalized): Secondary | ICD-10-CM

## 2023-09-18 NOTE — Therapy (Addendum)
OUTPATIENT PHYSICAL THERAPY LOWER EXTREMITY TREATMENT   Patient Name: Gabrielle Webb MRN: [id number] DOB:October 07, 1941, 82 y.o., female Today's Date: 09/18/2023  END OF SESSION:  PT End of Session - 09/18/23 0859     Visit Number 4    Number of Visits 13    Date for PT Re-Evaluation 10/02/23    PT Start Time 0900    PT Stop Time 0943    PT Time Calculation (min) 43 min    Activity Tolerance Patient tolerated treatment well    Behavior During Therapy [facility] for tasks assessed/performed             Past Medical History:  Diagnosis Date   Allergy    Anxiety    Breast cancer (HCC) 7/12   Right   Cellulitis    Diverticulosis of colon    GERD (gastroesophageal reflux disease)    Headache    sinus   History of hiatal hernia    Hx of radiation therapy 06/20/11 - 07/11/11   right breast   Hypothyroid    Osteoporosis    Personal history of radiation therapy 2012   Right Breast Cancer   PONV (postoperative nausea and vomiting)    Skin cancer    basal and squamous cell   Vertigo    Past Surgical History:  Procedure Laterality Date   ABDOMINAL HYSTERECTOMY N/A 11/08/2016   Procedure: HYSTERECTOMY TOTAL  ABDOMINAL;  Surgeon: Marcelle Overlie, MD;  Location: [facility];  Service: Gynecology;  Laterality: N/A;   APPENDECTOMY  07/2007   back sugery  1970   BREAST BIOPSY Left    Benign   BREAST EXCISIONAL BIOPSY Left 1983   BREAST EXCISIONAL BIOPSY Left 1983   BREAST LUMPECTOMY Right 05/16/2011   COLONOSCOPY     COLONOSCOPY WITH PROPOFOL N/A 08/27/2018   Procedure: COLONOSCOPY WITH PROPOFOL;  Surgeon: Scot Jun, MD;  Location: [facility] ENDOSCOPY;  Service: Endoscopy;  Laterality: N/A;   COLONOSCOPY WITH PROPOFOL N/A 08/16/2021   Procedure: COLONOSCOPY WITH PROPOFOL;  Surgeon: Earline Mayotte, MD;  Location: [facility] ENDOSCOPY;  Service: Endoscopy;  Laterality: N/A;   DIAGNOSTIC LAPAROSCOPY     DILATION AND CURETTAGE OF UTERUS N/A 05/31/2014   Procedure: DILATATION AND  CURETTAGE WITH ULTRASOUND GUIDANCE;  Surgeon: Jeani Hawking, MD;  Location: [facility];  Service: Gynecology;  Laterality: N/A;   ESOPHAGOGASTRODUODENOSCOPY  04/20/2003   KNEE SURGERY  1962   MASTECTOMY     right eye surgery     SALPINGOOPHORECTOMY Bilateral 11/08/2016   Procedure: BILATERAL SALPINGO OOPHORECTOMY;  Surgeon: Marcelle Overlie, MD;  Location: [facility];  Service: Gynecology;  Laterality: Bilateral;   UPPER GI ENDOSCOPY     Patient Active Problem List   Diagnosis Date Noted   Left inguinal pain 08/21/2023   LLQ pain 08/21/2023   Normocytic anemia 07/09/2023   Post covid-19 condition, unspecified 07/05/2023   Dark stools 07/05/2023   Hypokalemia 06/30/2023   COVID-19 virus infection 06/20/2023   Skin lesion 05/02/2023   Right knee pain 04/24/2023   Vaginal pain 01/18/2023   Grief reaction 07/04/2022   Encounter for screening mammogram for breast cancer 12/18/2021   Anxious mood as adjustment reaction 06/09/2021   Breast lump on right side at 8 o'clock position 05/03/2020   History of breast cancer 05/03/2020   Medicare annual wellness visit, subsequent 05/28/2018   Estrogen deficiency 05/28/2018   Fatigue 05/22/2017   Chest discomfort 05/22/2017   S/P TAH-BSO 11/08/2016   Routine general medical examination at a  health care facility 05/06/2016   Malignant neoplasm of upper-outer quadrant of right breast in female, estrogen receptor positive (HCC) 03/21/2015   History of tamoxifen therapy 05/18/2014   Encounter for routine gynecological examination 05/06/2013   Colon cancer screening 05/06/2013   Encounter for Medicare annual wellness exam 04/28/2013   Hx of radiation therapy    Hx of Breast cancer, stage 1, Right, UOQ, Receptor+,Her2- 05/29/2011    Class: Stage 1   Other screening mammogram 03/21/2011   Post-menopausal 03/21/2011   Hypothyroidism 01/23/2008   Pure hypercholesterolemia 01/23/2008   Allergic rhinitis 01/23/2008   GERD 01/23/2008   Diverticulosis of  colon 01/23/2008   IBS 01/23/2008   FIBROCYSTIC BREAST DISEASE 01/23/2008   Osteoporosis 10/17/2007    PCP: Judy Pimple MD  REFERRING PROVIDER: Marcelle Overlie, MD  REFERRING DIAG:  [phone] (ICD-10-CM) - Pain in right knee    THERAPY DIAG:  Right knee pain, unspecified chronicity  Muscle weakness (generalized)  Rationale for Evaluation and Treatment: Rehabilitation  ONSET DATE: April, 2024   SUBJECTIVE:   SUBJECTIVE STATEMENT: Pt reports 5-6/10 NPS in bilat LE's following completing her HEP on Monday. She reports this pain as feeling more so like soreness in her gluteal area.  PERTINENT HISTORY: Pt reports subacute R knee pain. This pain began following passive R knee flexion to end range during a pelvic PT visit, four months ago (April, 2024) and has continued to sustain the same level of pain since then. Pain reported at the anterior medial aspect of the R knee. Pain described as stabbing and sharp. Pt's pain with no activity is 0/10 NPS and 8-9/10 NPS with activity. Pt denies radicular symptoms in RLE, however does have LLE N/T. Pt has joint crepitus, popping and clicking in the knee joint. Pt's aggravating factors include prolonged walking, and stairs. The pain is relieved with a RLE knee sleeve, ice and OTC medications. Pt complaints of "pulling" when she flexes her knee and buckling of the knees 2/2 to weakness w/ ambulation.  PAIN:  Are you having pain? Yes: NPRS scale: 0/10 Pain location: Medial aspect of the anterior R knee Pain description: Sharp, stabbing pain  Aggravating factors: Prolonged walking, stairs  Relieving factors: Sleeve, OTC medications, ice  PRECAUTIONS: Fall  RED FLAGS: None   WEIGHT BEARING RESTRICTIONS: No  FALLS:  Has patient fallen in last 6 months? No  No stairs  OCCUPATION: Retired  PLOF: Independent  PATIENT GOALS: Want to walk without pain and reduce knee buckling.   NEXT MD VISIT: N/A for the knee   OBJECTIVE:    DIAGNOSTIC FINDINGS:  EXAM: RIGHT KNEE - COMPLETE 4+ VIEW   COMPARISON:  None Available.   FINDINGS: No acute fracture or dislocation. Small joint effusion. Moderate to severe lateral and mild medial compartment joint space narrowing. Patellofemoral joint space is relatively preserved. Small tricompartmental marginal osteophytes. Osteopenia. Chondrocalcinosis of the menisci. Soft tissues are unremarkable.   Single AP view of the left knee is unremarkable, other than chondrocalcinosis of the menisci.   IMPRESSION: 1. Tricompartmental osteoarthritis, moderate to severe in the lateral compartment.  PATIENT SURVEYS:  FOTO 52/68  COGNITION: Overall cognitive status: Within functional limits for tasks assessed     SENSATION: WFL   MUSCLE LENGTH: Hamstrings: Limited RLE/LLE <90 deg.    POSTURE:  Increased knee valgus of the R knee.   PALPATION: No TTP noted at the anterior or posterior med/ lat oint line   LOWER EXTREMITY ROM:  Active ROM Right eval Left  eval  Knee flexion 100 120  Knee extension 4 0   (Blank rows = not tested)  LOWER EXTREMITY MMT:  MMT Right eval Left eval  Hip flexion 4- 4  Hip extension 2+ 2+  Hip abduction 4- 4-  Hip adduction 4- 4-  Hip internal rotation 4- 4  Hip external rotation 4 4  Knee flexion 4 4  Knee extension 4 4  Ankle dorsiflexion 4+ 4+  Ankle plantarflexion 4 4   (Blank rows = not tested)  LOWER EXTREMITY SPECIAL TESTS: (R) Knee special tests: Anterior drawer test: negative, Posterior drawer test: negative, and Valgus stress test: negative, Varus stress test: negative   FUNCTIONAL TESTS:  5 times sit to stand: Pt unable to perform 5x STS 2/2 R knee pain, defer to next session   GAIT: Increased R knee valgus noted w/ ambulation.   JOINT MOBILITY: Mild R knee joint hypomobility felt in all planes. Typical R patellar movements noted w/ mobilizations.    TODAY'S TREATMENT:                                                                                                                               DATE: 09/18/23  Therapeutic Exercise: Nustep x5 minutes for LE strengthening and mobility  Lateral stepping w/ GTB 6 x5' down and back Supine PROM RLE/LLE hip flexion w/ adduction stretch 2 x30 Supine hamstring stretch w/ strap RLE/LLE 2 x30sec/ each Standing squats onto elevated plinth w/ 2 KG ball 3 x10.  Seated bilat LE leg press w/ 25# 3 x8  Seated knee extension w/ bilat LE's 15# 2 x8 Forward step ups onto 6" step w/ 4# in bilat Ue's x10 Lateral step ups onto 6" step w/ 4# in bilat Ue's  x10   PATIENT EDUCATION:  Education details: HEP given to pt, prognosis Person educated: Patient Education method: Medical illustrator Education comprehension: verbalized understanding and returned demonstration  HOME EXERCISE PROGRAM: Access Code: [email or web address] URL: [email or web address] Date: 08/21/2023 Prepared by: Ronnie Derby  Exercises - Supine Heel Slide  - 1 x daily - 7 x weekly - 2 sets - 12 reps - Long Sitting Quad Set with Towel Roll Under Heel  - 1 x daily - 7 x weekly - 2 sets - 12 reps - 3 hold - Mini Squat with Counter Support  - 1 x daily - 3-4 x weekly - 3 sets - 10 reps - Sidelying Hip Abduction  - 1 x daily - 3-4 x weekly - 2 sets - 10 reps  ASSESSMENT:  CLINICAL IMPRESSION:  Session focused on progressing RLE strengthening exercises emphasizing knee flexion/ extension. Pt continues to display improvements in bilat LE strength noted w/ strength progression exercises at today's session (see above). Stretching/ mobility exercises were incoporated at today's session to assist in relieving muscle soreness from pt's HEP completion. Pt educated and verbalized/ demonstrated understanding of the importance of stretching following completion of her HEP and on stretches  that would be beneficial to her (see above). Pt would continue to benefit from skilled PT services to address  these deficits to reduce pain and maximize functional capacity with walking, steps, and household ADL's.  OBJECTIVE IMPAIRMENTS: decreased balance, decreased mobility, difficulty walking, decreased ROM, decreased strength, impaired flexibility, and pain.   ACTIVITY LIMITATIONS: carrying, lifting, squatting, and locomotion level  PARTICIPATION LIMITATIONS: community activity  PERSONAL FACTORS: Age, Past/current experiences, Time since onset of injury/illness/exacerbation, and 1 comorbidity: Osteoporosis  are also affecting patient's functional outcome.   REHAB POTENTIAL: Good  CLINICAL DECISION MAKING: Stable/uncomplicated  EVALUATION COMPLEXITY: Low   GOALS: Goals reviewed with patient? Yes  SHORT TERM GOALS: Target date: 09/17/23 Pt will be independent with HEP to improve R knee strength and decrease pain with functional activities  Baseline: 08/21/23: HEP given to pt  Goal status: INITIAL   LONG TERM GOALS: Target date: 10/02/23  Pt will improve FOTO to target score to demonstrate clinically significant improvement in functional mobility.  Baseline: 08/21/23: 52/68 Goal status: INITIAL  2.  Pt will improve 5xSTS to </= 14.8 seconds to display improvements in LE strength and equal age- matched norms.  Baseline: 08/21/23: Deferred to next session  Goal status: INITIAL  3.  Pt will report <6/10 pain in R knee w/ prolonged ambulation to display clinically significant pain improvement. Baseline:08/21/23: 8/10 Goal status: INITIAL  4. Pt will improve R knee flexion AROM to 120 deg and R knee extension to 0 deg to be equal to L knee AROM for improved functional mobility.   Baseline: 08/21/23: Active ROM Right eval Left eval  Knee flexion 100 120  Knee extension 4 0   (Blank rows = not tested)  Goal status: INITIAL     PLAN:  PT FREQUENCY: 2x/week  PT DURATION: 6 weeks  PLANNED INTERVENTIONS: Therapeutic exercises, Therapeutic activity, Neuromuscular re-education,  Balance training, Gait training, Patient/Family education, Self Care, Joint mobilization, Joint manipulation, Stair training, Spinal manipulation, Spinal mobilization, Cryotherapy, Moist heat, Manual therapy, and Re-evaluation  PLAN FOR NEXT SESSION: Update HEP, progress bilat LE strengthening exercises.   Lovie Macadamia, SPT   Delphia Grates. Fairly IV, PT, DPT Physical Therapist- [place]  [facility]  09/18/2023, 10:14 AM

## 2023-09-20 ENCOUNTER — Ambulatory Visit: Payer: PPO

## 2023-09-20 DIAGNOSIS — M25561 Pain in right knee: Secondary | ICD-10-CM

## 2023-09-20 DIAGNOSIS — M6281 Muscle weakness (generalized): Secondary | ICD-10-CM

## 2023-09-20 NOTE — Therapy (Addendum)
OUTPATIENT PHYSICAL THERAPY LOWER EXTREMITY TREATMENT   Patient Name: Gabrielle Webb MRN: [id number] DOB:August 07, 1941, 82 y.o., female Today's Date: 09/20/2023  END OF SESSION:  PT End of Session - 09/20/23 0810     Visit Number 5    Number of Visits 13    Date for PT Re-Evaluation 10/02/23    PT Start Time 0815    PT Stop Time 0858    PT Time Calculation (min) 43 min    Activity Tolerance Patient tolerated treatment well    Behavior During Therapy [facility] for tasks assessed/performed             Past Medical History:  Diagnosis Date   Allergy    Anxiety    Breast cancer (HCC) 7/12   Right   Cellulitis    Diverticulosis of colon    GERD (gastroesophageal reflux disease)    Headache    sinus   History of hiatal hernia    Hx of radiation therapy 06/20/11 - 07/11/11   right breast   Hypothyroid    Osteoporosis    Personal history of radiation therapy 2012   Right Breast Cancer   PONV (postoperative nausea and vomiting)    Skin cancer    basal and squamous cell   Vertigo    Past Surgical History:  Procedure Laterality Date   ABDOMINAL HYSTERECTOMY N/A 11/08/2016   Procedure: HYSTERECTOMY TOTAL  ABDOMINAL;  Surgeon: Marcelle Overlie, MD;  Location: [facility];  Service: Gynecology;  Laterality: N/A;   APPENDECTOMY  07/2007   back sugery  1970   BREAST BIOPSY Left    Benign   BREAST EXCISIONAL BIOPSY Left 1983   BREAST EXCISIONAL BIOPSY Left 1983   BREAST LUMPECTOMY Right 05/16/2011   COLONOSCOPY     COLONOSCOPY WITH PROPOFOL N/A 08/27/2018   Procedure: COLONOSCOPY WITH PROPOFOL;  Surgeon: Scot Jun, MD;  Location: [facility] ENDOSCOPY;  Service: Endoscopy;  Laterality: N/A;   COLONOSCOPY WITH PROPOFOL N/A 08/16/2021   Procedure: COLONOSCOPY WITH PROPOFOL;  Surgeon: Earline Mayotte, MD;  Location: [facility] ENDOSCOPY;  Service: Endoscopy;  Laterality: N/A;   DIAGNOSTIC LAPAROSCOPY     DILATION AND CURETTAGE OF UTERUS N/A 05/31/2014   Procedure: DILATATION AND  CURETTAGE WITH ULTRASOUND GUIDANCE;  Surgeon: Jeani Hawking, MD;  Location: [facility];  Service: Gynecology;  Laterality: N/A;   ESOPHAGOGASTRODUODENOSCOPY  04/20/2003   KNEE SURGERY  1962   MASTECTOMY     right eye surgery     SALPINGOOPHORECTOMY Bilateral 11/08/2016   Procedure: BILATERAL SALPINGO OOPHORECTOMY;  Surgeon: Marcelle Overlie, MD;  Location: [facility];  Service: Gynecology;  Laterality: Bilateral;   UPPER GI ENDOSCOPY     Patient Active Problem List   Diagnosis Date Noted   Left inguinal pain 08/21/2023   LLQ pain 08/21/2023   Normocytic anemia 07/09/2023   Post covid-19 condition, unspecified 07/05/2023   Dark stools 07/05/2023   Hypokalemia 06/30/2023   COVID-19 virus infection 06/20/2023   Skin lesion 05/02/2023   Right knee pain 04/24/2023   Vaginal pain 01/18/2023   Grief reaction 07/04/2022   Encounter for screening mammogram for breast cancer 12/18/2021   Anxious mood as adjustment reaction 06/09/2021   Breast lump on right side at 8 o'clock position 05/03/2020   History of breast cancer 05/03/2020   Medicare annual wellness visit, subsequent 05/28/2018   Estrogen deficiency 05/28/2018   Fatigue 05/22/2017   Chest discomfort 05/22/2017   S/P TAH-BSO 11/08/2016   Routine general medical examination at a  health care facility 05/06/2016   Malignant neoplasm of upper-outer quadrant of right breast in female, estrogen receptor positive (HCC) 03/21/2015   History of tamoxifen therapy 05/18/2014   Encounter for routine gynecological examination 05/06/2013   Colon cancer screening 05/06/2013   Encounter for Medicare annual wellness exam 04/28/2013   Hx of radiation therapy    Hx of Breast cancer, stage 1, Right, UOQ, Receptor+,Her2- 05/29/2011    Class: Stage 1   Other screening mammogram 03/21/2011   Post-menopausal 03/21/2011   Hypothyroidism 01/23/2008   Pure hypercholesterolemia 01/23/2008   Allergic rhinitis 01/23/2008   GERD 01/23/2008   Diverticulosis of  colon 01/23/2008   IBS 01/23/2008   FIBROCYSTIC BREAST DISEASE 01/23/2008   Osteoporosis 10/17/2007    PCP: Judy Pimple MD  REFERRING PROVIDER: Marcelle Overlie, MD  REFERRING DIAG:  [phone] (ICD-10-CM) - Pain in right knee    THERAPY DIAG:  Right knee pain, unspecified chronicity  Muscle weakness (generalized)  Rationale for Evaluation and Treatment: Rehabilitation  ONSET DATE: April, 2024   SUBJECTIVE:   SUBJECTIVE STATEMENT: Pt reports no pain in the R knee at today's session. She expresses she had mild pain in the posterior R leg following last visit, and increasing her walking but this pain has subsided.   PERTINENT HISTORY: Pt reports subacute R knee pain. This pain began following passive R knee flexion to end range during a pelvic PT visit, four months ago (April, 2024) and has continued to sustain the same level of pain since then. Pain reported at the anterior medial aspect of the R knee. Pain described as stabbing and sharp. Pt's pain with no activity is 0/10 NPS and 8-9/10 NPS with activity. Pt denies radicular symptoms in RLE, however does have LLE N/T. Pt has joint crepitus, popping and clicking in the knee joint. Pt's aggravating factors include prolonged walking, and stairs. The pain is relieved with a RLE knee sleeve, ice and OTC medications. Pt complaints of "pulling" when she flexes her knee and buckling of the knees 2/2 to weakness w/ ambulation.  PAIN:  Are you having pain? Yes: NPRS scale: 0/10 Pain location: Medial aspect of the anterior R knee Pain description: Sharp, stabbing pain  Aggravating factors: Prolonged walking, stairs  Relieving factors: Sleeve, OTC medications, ice  PRECAUTIONS: Fall  RED FLAGS: None   WEIGHT BEARING RESTRICTIONS: No  FALLS:  Has patient fallen in last 6 months? No  No stairs  OCCUPATION: Retired  PLOF: Independent  PATIENT GOALS: Want to walk without pain and reduce knee buckling.   NEXT MD VISIT: N/A for  the knee   OBJECTIVE:   DIAGNOSTIC FINDINGS:  EXAM: RIGHT KNEE - COMPLETE 4+ VIEW   COMPARISON:  None Available.   FINDINGS: No acute fracture or dislocation. Small joint effusion. Moderate to severe lateral and mild medial compartment joint space narrowing. Patellofemoral joint space is relatively preserved. Small tricompartmental marginal osteophytes. Osteopenia. Chondrocalcinosis of the menisci. Soft tissues are unremarkable.   Single AP view of the left knee is unremarkable, other than chondrocalcinosis of the menisci.   IMPRESSION: 1. Tricompartmental osteoarthritis, moderate to severe in the lateral compartment.  PATIENT SURVEYS:  FOTO 52/68  COGNITION: Overall cognitive status: Within functional limits for tasks assessed     SENSATION: WFL   MUSCLE LENGTH: Hamstrings: Limited RLE/LLE <90 deg.    POSTURE:  Increased knee valgus of the R knee.   PALPATION: No TTP noted at the anterior or posterior med/ lat oint line   LOWER  EXTREMITY ROM:  Active ROM Right eval Left eval  Knee flexion 100 120  Knee extension 4 0   (Blank rows = not tested)  LOWER EXTREMITY MMT:  MMT Right eval Left eval  Hip flexion 4- 4  Hip extension 2+ 2+  Hip abduction 4- 4-  Hip adduction 4- 4-  Hip internal rotation 4- 4  Hip external rotation 4 4  Knee flexion 4 4  Knee extension 4 4  Ankle dorsiflexion 4+ 4+  Ankle plantarflexion 4 4   (Blank rows = not tested)  LOWER EXTREMITY SPECIAL TESTS: (R) Knee special tests: Anterior drawer test: negative, Posterior drawer test: negative, and Valgus stress test: negative, Varus stress test: negative   FUNCTIONAL TESTS:  5 times sit to stand: Pt unable to perform 5x STS 2/2 R knee pain, defer to next session   GAIT: Increased R knee valgus noted w/ ambulation.   JOINT MOBILITY: Mild R knee joint hypomobility felt in all planes. Typical R patellar movements noted w/ mobilizations.    TODAY'S TREATMENT:                                                                                                                               DATE: 09/20/23  Therapeutic Exercise: Nustep x5 minutes for LE strengthening and mobility  Lateral stepping w/ blue TB 6 x5' down and back TRX reverse lunges RLE/LLE 2 x6/ each side (multimodal cueing needed to complete exercise, good carryover)  Seated bilat LE leg press w/ 25# 3 x8   Seated knee extension w/ bilat LE's 15# x8, 10# 2x8 Seated knee flexion w/ bilat LE's 15# 3x8 Standing squats on elevated plinth 2x10 w/ 2KG medicine ball Standing lateral lunges RLE/LLE x8/ each side w/ 2KG medicine ball. Supine RLE/LLE hip flexion w/ adduction stretch 2x30 Seated hamstring stretch w/ RLE/LLE x30 sec each   PATIENT EDUCATION:  Education details: HEP given to pt, prognosis Person educated: Patient Education method: Medical illustrator Education comprehension: verbalized understanding and returned demonstration  HOME EXERCISE PROGRAM: Access Code: [email or web address] URL: [email or web address] Date: 08/21/2023 Prepared by: Ronnie Derby  Exercises - Supine Heel Slide  - 1 x daily - 7 x weekly - 2 sets - 12 reps - Long Sitting Quad Set with Towel Roll Under Heel  - 1 x daily - 7 x weekly - 2 sets - 12 reps - 3 hold - Mini Squat with Counter Support  - 1 x daily - 3-4 x weekly - 3 sets - 10 reps - Sidelying Hip Abduction  - 1 x daily - 3-4 x weekly - 2 sets - 10 reps  ASSESSMENT:  CLINICAL IMPRESSION:  Session focused on progressing RLE strengthening exercises emphasizing knee flexion/ extension. Pt continues to note reduced pain and improved activity tolerance to exercises at today's session. Pt displays progression in bilat LE strength noted w/ progressing strengthening exercises at today's session on the MATRIX machine for knee extension and knee  flexion. Pt would continue to benefit from skilled PT services to address these deficits to reduce pain and maximize  functional capacity with walking, steps, and household ADL's.  OBJECTIVE IMPAIRMENTS: decreased balance, decreased mobility, difficulty walking, decreased ROM, decreased strength, impaired flexibility, and pain.   ACTIVITY LIMITATIONS: carrying, lifting, squatting, and locomotion level  PARTICIPATION LIMITATIONS: community activity  PERSONAL FACTORS: Age, Past/current experiences, Time since onset of injury/illness/exacerbation, and 1 comorbidity: Osteoporosis  are also affecting patient's functional outcome.   REHAB POTENTIAL: Good  CLINICAL DECISION MAKING: Stable/uncomplicated  EVALUATION COMPLEXITY: Low   GOALS: Goals reviewed with patient? Yes  SHORT TERM GOALS: Target date: 09/17/23 Pt will be independent with HEP to improve R knee strength and decrease pain with functional activities  Baseline: 08/21/23: HEP given to pt  Goal status: INITIAL   LONG TERM GOALS: Target date: 10/02/23  Pt will improve FOTO to target score to demonstrate clinically significant improvement in functional mobility.  Baseline: 08/21/23: 52/68 Goal status: INITIAL  2.  Pt will improve 5xSTS to </= 14.8 seconds to display improvements in LE strength and equal age- matched norms.  Baseline: 08/21/23: Deferred to next session  Goal status: INITIAL  3.  Pt will report <6/10 pain in R knee w/ prolonged ambulation to display clinically significant pain improvement. Baseline:08/21/23: 8/10 Goal status: INITIAL  4. Pt will improve R knee flexion AROM to 120 deg and R knee extension to 0 deg to be equal to L knee AROM for improved functional mobility.   Baseline: 08/21/23: Active ROM Right eval Left eval  Knee flexion 100 120  Knee extension 4 0   (Blank rows = not tested)  Goal status: INITIAL     PLAN:  PT FREQUENCY: 2x/week  PT DURATION: 6 weeks  PLANNED INTERVENTIONS: Therapeutic exercises, Therapeutic activity, Neuromuscular re-education, Balance training, Gait training, Patient/Family  education, Self Care, Joint mobilization, Joint manipulation, Stair training, Spinal manipulation, Spinal mobilization, Cryotherapy, Moist heat, Manual therapy, and Re-evaluation  PLAN FOR NEXT SESSION: 5xSTS, Update HEP, progress SLS strengthening.   Lovie Macadamia, SPT   Delphia Grates. Fairly IV, PT, DPT Physical Therapist-   [facility]  09/20/2023, 10:12 AM

## 2023-09-24 ENCOUNTER — Ambulatory Visit: Payer: PPO

## 2023-09-24 DIAGNOSIS — M6281 Muscle weakness (generalized): Secondary | ICD-10-CM

## 2023-09-24 DIAGNOSIS — M25561 Pain in right knee: Secondary | ICD-10-CM

## 2023-09-24 NOTE — Therapy (Addendum)
OUTPATIENT PHYSICAL THERAPY LOWER EXTREMITY TREATMENT   Patient Name: Gabrielle Webb MRN: [id number] DOB:1941-07-21, 82 y.o., female Today's Date: 09/24/2023  END OF SESSION:  PT End of Session - 09/24/23 0945     Visit Number 6    Number of Visits 13    Date for PT Re-Evaluation 10/02/23    PT Start Time 0945    PT Stop Time 1028    PT Time Calculation (min) 43 min    Activity Tolerance Patient tolerated treatment well    Behavior During Therapy [facility] for tasks assessed/performed             Past Medical History:  Diagnosis Date   Allergy    Anxiety    Breast cancer (HCC) 7/12   Right   Cellulitis    Diverticulosis of colon    GERD (gastroesophageal reflux disease)    Headache    sinus   History of hiatal hernia    Hx of radiation therapy 06/20/11 - 07/11/11   right breast   Hypothyroid    Osteoporosis    Personal history of radiation therapy 2012   Right Breast Cancer   PONV (postoperative nausea and vomiting)    Skin cancer    basal and squamous cell   Vertigo    Past Surgical History:  Procedure Laterality Date   ABDOMINAL HYSTERECTOMY N/A 11/08/2016   Procedure: HYSTERECTOMY TOTAL  ABDOMINAL;  Surgeon: Marcelle Overlie, MD;  Location: [facility];  Service: Gynecology;  Laterality: N/A;   APPENDECTOMY  07/2007   back sugery  1970   BREAST BIOPSY Left    Benign   BREAST EXCISIONAL BIOPSY Left 1983   BREAST EXCISIONAL BIOPSY Left 1983   BREAST LUMPECTOMY Right 05/16/2011   COLONOSCOPY     COLONOSCOPY WITH PROPOFOL N/A 08/27/2018   Procedure: COLONOSCOPY WITH PROPOFOL;  Surgeon: Scot Jun, MD;  Location: [facility];  Service: Endoscopy;  Laterality: N/A;   COLONOSCOPY WITH PROPOFOL N/A 08/16/2021   Procedure: COLONOSCOPY WITH PROPOFOL;  Surgeon: Earline Mayotte, MD;  Location: [facility] ENDOSCOPY;  Service: Endoscopy;  Laterality: N/A;   DIAGNOSTIC LAPAROSCOPY     DILATION AND CURETTAGE OF UTERUS N/A 05/31/2014   Procedure: DILATATION AND  CURETTAGE WITH ULTRASOUND GUIDANCE;  Surgeon: Jeani Hawking, MD;  Location: [facility];  Service: Gynecology;  Laterality: N/A;   ESOPHAGOGASTRODUODENOSCOPY  04/20/2003   KNEE SURGERY  1962   MASTECTOMY     right eye surgery     SALPINGOOPHORECTOMY Bilateral 11/08/2016   Procedure: BILATERAL SALPINGO OOPHORECTOMY;  Surgeon: Marcelle Overlie, MD;  Location: [facility];  Service: Gynecology;  Laterality: Bilateral;   UPPER GI ENDOSCOPY     Patient Active Problem List   Diagnosis Date Noted   Left inguinal pain 08/21/2023   LLQ pain 08/21/2023   Normocytic anemia 07/09/2023   Post covid-19 condition, unspecified 07/05/2023   Dark stools 07/05/2023   Hypokalemia 06/30/2023   COVID-19 virus infection 06/20/2023   Skin lesion 05/02/2023   Right knee pain 04/24/2023   Vaginal pain 01/18/2023   Grief reaction 07/04/2022   Encounter for screening mammogram for breast cancer 12/18/2021   Anxious mood as adjustment reaction 06/09/2021   Breast lump on right side at 8 o'clock position 05/03/2020   History of breast cancer 05/03/2020   Medicare annual wellness visit, subsequent 05/28/2018   Estrogen deficiency 05/28/2018   Fatigue 05/22/2017   Chest discomfort 05/22/2017   S/P TAH-BSO 11/08/2016   Routine general medical examination at a  health care facility 05/06/2016   Malignant neoplasm of upper-outer quadrant of right breast in female, estrogen receptor positive (HCC) 03/21/2015   History of tamoxifen therapy 05/18/2014   Encounter for routine gynecological examination 05/06/2013   Colon cancer screening 05/06/2013   Encounter for Medicare annual wellness exam 04/28/2013   Hx of radiation therapy    Hx of Breast cancer, stage 1, Right, UOQ, Receptor+,Her2- 05/29/2011    Class: Stage 1   Other screening mammogram 03/21/2011   Post-menopausal 03/21/2011   Hypothyroidism 01/23/2008   Pure hypercholesterolemia 01/23/2008   Allergic rhinitis 01/23/2008   GERD 01/23/2008   Diverticulosis of  colon 01/23/2008   IBS 01/23/2008   FIBROCYSTIC BREAST DISEASE 01/23/2008   Osteoporosis 10/17/2007    PCP: Judy Pimple MD  REFERRING PROVIDER: Marcelle Overlie, MD  REFERRING DIAG:  [phone] (ICD-10-CM) - Pain in right knee    THERAPY DIAG:  Right knee pain, unspecified chronicity  Muscle weakness (generalized)  Rationale for Evaluation and Treatment: Rehabilitation  ONSET DATE: April, 2024   SUBJECTIVE:   SUBJECTIVE STATEMENT: Pt reports no pain in the R knee at today's session. She expresses she had mild pain in the lateral aspect of the R leg when stepping onto a curb on the way in today.    PERTINENT HISTORY: Pt reports subacute R knee pain. This pain began following passive R knee flexion to end range during a pelvic PT visit, four months ago (April, 2024) and has continued to sustain the same level of pain since then. Pain reported at the anterior medial aspect of the R knee. Pain described as stabbing and sharp. Pt's pain with no activity is 0/10 NPS and 8-9/10 NPS with activity. Pt denies radicular symptoms in RLE, however does have LLE N/T. Pt has joint crepitus, popping and clicking in the knee joint. Pt's aggravating factors include prolonged walking, and stairs. The pain is relieved with a RLE knee sleeve, ice and OTC medications. Pt complaints of "pulling" when she flexes her knee and buckling of the knees 2/2 to weakness w/ ambulation.  PAIN:  Are you having pain? Yes: NPRS scale: 0/10 Pain location: Medial aspect of the anterior R knee Pain description: Sharp, stabbing pain  Aggravating factors: Prolonged walking, stairs  Relieving factors: Sleeve, OTC medications, ice  PRECAUTIONS: Fall  RED FLAGS: None   WEIGHT BEARING RESTRICTIONS: No  FALLS:  Has patient fallen in last 6 months? No  No stairs  OCCUPATION: Retired  PLOF: Independent  PATIENT GOALS: Want to walk without pain and reduce knee buckling.   NEXT MD VISIT: N/A for the knee    OBJECTIVE:   DIAGNOSTIC FINDINGS:  EXAM: RIGHT KNEE - COMPLETE 4+ VIEW   COMPARISON:  None Available.   FINDINGS: No acute fracture or dislocation. Small joint effusion. Moderate to severe lateral and mild medial compartment joint space narrowing. Patellofemoral joint space is relatively preserved. Small tricompartmental marginal osteophytes. Osteopenia. Chondrocalcinosis of the menisci. Soft tissues are unremarkable.   Single AP view of the left knee is unremarkable, other than chondrocalcinosis of the menisci.   IMPRESSION: 1. Tricompartmental osteoarthritis, moderate to severe in the lateral compartment.  PATIENT SURVEYS:  FOTO 52/68  COGNITION: Overall cognitive status: Within functional limits for tasks assessed     SENSATION: WFL   MUSCLE LENGTH: Hamstrings: Limited RLE/LLE <90 deg.    POSTURE:  Increased knee valgus of the R knee.   PALPATION: No TTP noted at the anterior or posterior med/ lat oint line  LOWER EXTREMITY ROM:  Active ROM Right eval Left eval  Knee flexion 100 120  Knee extension 4 0   (Blank rows = not tested)  LOWER EXTREMITY MMT:  MMT Right eval Left eval  Hip flexion 4- 4  Hip extension 2+ 2+  Hip abduction 4- 4-  Hip adduction 4- 4-  Hip internal rotation 4- 4  Hip external rotation 4 4  Knee flexion 4 4  Knee extension 4 4  Ankle dorsiflexion 4+ 4+  Ankle plantarflexion 4 4   (Blank rows = not tested)  LOWER EXTREMITY SPECIAL TESTS: (R) Knee special tests: Anterior drawer test: negative, Posterior drawer test: negative, and Valgus stress test: negative, Varus stress test: negative   FUNCTIONAL TESTS:  5 times sit to stand: Pt unable to perform 5x STS 2/2 R knee pain, defer to next session  5xSTS: 8.45 seconds  GAIT: Increased R knee valgus noted w/ ambulation.   JOINT MOBILITY: Mild R knee joint hypomobility felt in all planes. Typical R patellar movements noted w/ mobilizations.    TODAY'S TREATMENT:                                                                                                                               DATE: 09/24/23  Therapeutic Exercise: Nustep x5 minutes for LE strengthening and mobility  Lateral stepping w/ blue TB 2 x10' down and back Standing alternating lunges at second step on staircase w/ 4# DB in bilat UE's  Standing squats on elevated plinth 2x10 w/ 2KG medicine ball Standing lateral lunges RLE/LLE 2 x8/ each side w/ 2KG medicine ball. Seated bilat LE leg press w/ 25# x10, 30# 2 x8   Seated knee extension w/ bilat LE's 10# 3x8  PATIENT EDUCATION:  Education details: HEP given to pt, prognosis Person educated: Patient Education method: Medical illustrator Education comprehension: verbalized understanding and returned demonstration  HOME EXERCISE PROGRAM:  Access Code: [email or web address] URL: [email or web address] Date: 09/24/2023 Prepared by: Ronnie Derby  Exercises - Squat with Counter Support  - 1 x daily - 3-4 x weekly - 3 sets - 8 reps - Side Stepping with Resistance at Ankles  - 1 x daily - 3-4 x weekly - 3 sets - 5 reps - Side Lunge with Counter Support  - 1 x daily - 3-4 x weekly - 3 sets - 6 reps   ASSESSMENT:  CLINICAL IMPRESSION:  Session focused on progressing updating pt's HEP and RLE strengthening exercises. Pt notes progression in bilat LE strengthening displayed with improved activity tolerance at today's session. Pt's HEP was updated to reflect exercise progression, and tolerance will be assessed at next session. Pt was able to perform the 5xSTS in 8.45 seconds, which is significantly faster than her age- matched norms (14.35 seconds) showing progression in her bilat LE strength. Pt would continue to benefit from skilled PT services to address these deficits to reduce pain and maximize functional capacity with walking, steps, and  household ADL's.  OBJECTIVE IMPAIRMENTS: decreased balance, decreased mobility, difficulty  walking, decreased ROM, decreased strength, impaired flexibility, and pain.   ACTIVITY LIMITATIONS: carrying, lifting, squatting, and locomotion level  PARTICIPATION LIMITATIONS: community activity  PERSONAL FACTORS: Age, Past/current experiences, Time since onset of injury/illness/exacerbation, and 1 comorbidity: Osteoporosis  are also affecting patient's functional outcome.   REHAB POTENTIAL: Good  CLINICAL DECISION MAKING: Stable/uncomplicated  EVALUATION COMPLEXITY: Low   GOALS: Goals reviewed with patient? Yes  SHORT TERM GOALS: Target date: 09/17/23 Pt will be independent with HEP to improve R knee strength and decrease pain with functional activities  Baseline: 08/21/23: HEP given to pt  Goal status: INITIAL   LONG TERM GOALS: Target date: 10/02/23  Pt will improve FOTO to target score to demonstrate clinically significant improvement in functional mobility.  Baseline: 08/21/23: 52/68 Goal status: INITIAL  2.  Pt will improve 5xSTS to </= 14.8 seconds to display improvements in LE strength and equal age- matched norms.  Baseline: 08/21/23: Deferred to next session 09/24/23: 8.45 seconds Goal status: MET  3.  Pt will report <6/10 pain in R knee w/ prolonged ambulation to display clinically significant pain improvement. Baseline:08/21/23: 8/10 Goal status: INITIAL  4. Pt will improve R knee flexion AROM to 120 deg and R knee extension to 0 deg to be equal to L knee AROM for improved functional mobility.   Baseline: 08/21/23: Active ROM Right eval Left eval  Knee flexion 100 120  Knee extension 4 0   (Blank rows = not tested)  Goal status: INITIAL     PLAN:  PT FREQUENCY: 2x/week  PT DURATION: 6 weeks  PLANNED INTERVENTIONS: Therapeutic exercises, Therapeutic activity, Neuromuscular re-education, Balance training, Gait training, Patient/Family education, Self Care, Joint mobilization, Joint manipulation, Stair training, Spinal manipulation, Spinal mobilization,  Cryotherapy, Moist heat, Manual therapy, and Re-evaluation  PLAN FOR NEXT SESSION: Assess tolerance to updated HEP, progress SLS strengthening.   Lovie Macadamia, SPT   Delphia Grates. Fairly IV, PT, DPT Physical Therapist- [place]  [facility]  09/24/2023, 1:14 PM

## 2023-09-27 ENCOUNTER — Ambulatory Visit: Payer: PPO

## 2023-09-27 DIAGNOSIS — M25561 Pain in right knee: Secondary | ICD-10-CM | POA: Diagnosis not present

## 2023-09-27 DIAGNOSIS — M6281 Muscle weakness (generalized): Secondary | ICD-10-CM

## 2023-09-27 NOTE — Therapy (Addendum)
OUTPATIENT PHYSICAL THERAPY LOWER EXTREMITY TREATMENT   Patient Name: Gabrielle Webb MRN: [id number] DOB:[birth year], 82 y.o., female Today's Date: 09/27/2023  END OF SESSION:  PT End of Session - 09/27/23 0945     Visit Number 7    Number of Visits 13    Date for PT Re-Evaluation 10/02/23    PT Start Time 0945    PT Stop Time 1028    PT Time Calculation (min) 43 min    Activity Tolerance Patient tolerated treatment well    Behavior During Therapy [facility] for tasks assessed/performed             Past Medical History:  Diagnosis Date   Allergy    Anxiety    Breast cancer (HCC) 7/12   Right   Cellulitis    Diverticulosis of colon    GERD (gastroesophageal reflux disease)    Headache    sinus   History of hiatal hernia    Hx of radiation therapy 06/20/11 - 07/11/11   right breast   Hypothyroid    Osteoporosis    Personal history of radiation therapy 2012   Right Breast Cancer   PONV (postoperative nausea and vomiting)    Skin cancer    basal and squamous cell   Vertigo    Past Surgical History:  Procedure Laterality Date   ABDOMINAL HYSTERECTOMY N/A 11/08/2016   Procedure: HYSTERECTOMY TOTAL  ABDOMINAL;  Surgeon: Marcelle Overlie, MD;  Location: [facility];  Service: Gynecology;  Laterality: N/A;   APPENDECTOMY  07/2007   back sugery  1970   BREAST BIOPSY Left    Benign   BREAST EXCISIONAL BIOPSY Left 1983   BREAST EXCISIONAL BIOPSY Left 1983   BREAST LUMPECTOMY Right 05/16/2011   COLONOSCOPY     COLONOSCOPY WITH PROPOFOL N/A 08/27/2018   Procedure: COLONOSCOPY WITH PROPOFOL;  Surgeon: Scot Jun, MD;  Location: [facility] END[facility]COPY;  Service: Endoscopy;  Laterality: N/A;   COLONOSCOPY WITH PROPOFOL N/A 08/16/2021   Procedure: COLONOSCOPY WITH PROPOFOL;  Surgeon: Earline Mayotte, MD;  Location: [facility] ENDOSCOPY;  Service: Endoscopy;  Laterality: N/A;   DIAGNOSTIC LAPAROSCOPY     DILATION AND CURETTAGE OF UTERUS N/A 05/31/2014   Procedure: DILATATION AND  CURETTAGE WITH ULTRASOUND GUIDANCE;  Surgeon: Jeani Hawking, MD;  Location: [facility];  Service: Gynecology;  Laterality: N/A;   ESOPHAGOGASTRODUODENOSCOPY  04/20/2003   KNEE SURGERY  1962   MASTECTOMY     right eye surgery     SALPINGOOPHORECTOMY Bilateral 11/08/2016   Procedure: BILATERAL SALPINGO OOPHORECTOMY;  Surgeon: Marcelle Overlie, MD;  Location: [facility];  Service: Gynecology;  Laterality: Bilateral;   UPPER GI ENDOSCOPY     Patient Active Problem List   Diagnosis Date Noted   Left inguinal pain 08/21/2023   LLQ pain 08/21/2023   Normocytic anemia 07/09/2023   Post covid-19 condition, unspecified 07/05/2023   Dark stools 07/05/2023   Hypokalemia 06/30/2023   COVID-19 virus infection 06/20/2023   Skin lesion 05/02/2023   Right knee pain 04/24/2023   Vaginal pain 01/18/2023   Grief reaction 07/04/2022   Encounter for screening mammogram for breast cancer 12/18/2021   Anxious mood as adjustment reaction 06/09/2021   Breast lump on right side at 8 o'clock position 05/03/2020   History of breast cancer 05/03/2020   Medicare annual wellness visit, subsequent 05/28/2018   Estrogen deficiency 05/28/2018   Fatigue 05/22/2017   Chest discomfort 05/22/2017   S/P TAH-BSO 11/08/2016   Routine general medical examination at a  health care facility 05/06/2016   Malignant neoplasm of upper-outer quadrant of right breast in female, estrogen receptor positive (HCC) 03/21/2015   History of tamoxifen therapy 05/18/2014   Encounter for routine gynecological examination 05/06/2013   Colon cancer screening 05/06/2013   Encounter for Medicare annual wellness exam 04/28/2013   Hx of radiation therapy    Hx of Breast cancer, stage 1, Right, UOQ, Receptor+,Her2- 05/29/2011    Class: Stage 1   Other screening mammogram 03/21/2011   Post-menopausal 03/21/2011   Hypothyroidism 01/23/2008   Pure hypercholesterolemia 01/23/2008   Allergic rhinitis 01/23/2008   GERD 01/23/2008   Diverticulosis of  colon 01/23/2008   IBS 01/23/2008   FIBROCYSTIC BREAST DISEASE 01/23/2008   Osteoporosis 10/17/2007    PCP: Judy Pimple MD  REFERRING PROVIDER: Marcelle Overlie, MD  REFERRING DIAG:  [phone] (ICD-10-CM) - Pain in right knee    THERAPY DIAG:  Right knee pain, unspecified chronicity  Muscle weakness (generalized)  Rationale for Evaluation and Treatment: Rehabilitation  ONSET DATE: April, 2024   SUBJECTIVE:   SUBJECTIVE STATEMENT: Pt reports no pain in the R knee at today's session. She expresses she had mild pain in the lateral aspect of the R leg when stepping onto a curb on the way in today.    PERTINENT HISTORY: Pt reports subacute R knee pain. This pain began following passive R knee flexion to end range during a pelvic PT visit, four months ago (April, 2024) and has continued to sustain the same level of pain since then. Pain reported at the anterior medial aspect of the R knee. Pain described as stabbing and sharp. Pt's pain with no activity is 0/10 NPS and 8-9/10 NPS with activity. Pt denies radicular symptoms in RLE, however does have LLE N/T. Pt has joint crepitus, popping and clicking in the knee joint. Pt's aggravating factors include prolonged walking, and stairs. The pain is relieved with a RLE knee sleeve, ice and OTC medications. Pt complaints of "pulling" when she flexes her knee and buckling of the knees 2/2 to weakness w/ ambulation.  PAIN:  Are you having pain? Yes: NPRS scale: 0/10 Pain location: Medial aspect of the anterior R knee Pain description: Sharp, stabbing pain  Aggravating factors: Prolonged walking, stairs  Relieving factors: Sleeve, OTC medications, ice  PRECAUTIONS: Fall  RED FLAGS: None   WEIGHT BEARING RESTRICTIONS: No  FALLS:  Has patient fallen in last 6 months? No  No stairs  OCCUPATION: Retired  PLOF: Independent  PATIENT GOALS: Want to walk without pain and reduce knee buckling.   NEXT MD VISIT: N/A for the knee    OBJECTIVE:   DIAGNOSTIC FINDINGS:  EXAM: RIGHT KNEE - COMPLETE 4+ VIEW   COMPARISON:  None Available.   FINDINGS: No acute fracture or dislocation. Small joint effusion. Moderate to severe lateral and mild medial compartment joint space narrowing. Patellofemoral joint space is relatively preserved. Small tricompartmental marginal osteophytes. Osteopenia. Chondrocalcinosis of the menisci. Soft tissues are unremarkable.   Single AP view of the left knee is unremarkable, other than chondrocalcinosis of the menisci.   IMPRESSION: 1. Tricompartmental osteoarthritis, moderate to severe in the lateral compartment.  PATIENT SURVEYS:  FOTO 52/68  COGNITION: Overall cognitive status: Within functional limits for tasks assessed     SENSATION: WFL   MUSCLE LENGTH: Hamstrings: Limited RLE/LLE <90 deg.    POSTURE:  Increased knee valgus of the R knee.   PALPATION: No TTP noted at the anterior or posterior med/ lat oint line  LOWER EXTREMITY ROM:  Active ROM Right eval Left eval  Knee flexion 100 120  Knee extension 4 0   (Blank rows = not tested)  LOWER EXTREMITY MMT:  MMT Right eval Left eval  Hip flexion 4- 4  Hip extension 2+ 2+  Hip abduction 4- 4-  Hip adduction 4- 4-  Hip internal rotation 4- 4  Hip external rotation 4 4  Knee flexion 4 4  Knee extension 4 4  Ankle dorsiflexion 4+ 4+  Ankle plantarflexion 4 4   (Blank rows = not tested)  LOWER EXTREMITY SPECIAL TESTS: (R) Knee special tests: Anterior drawer test: negative, Posterior drawer test: negative, and Valgus stress test: negative, Varus stress test: negative   FUNCTIONAL TESTS:  5 times sit to stand: Pt unable to perform 5x STS 2/2 R knee pain, defer to next session  5xSTS: 8.45 seconds  GAIT: Increased R knee valgus noted w/ ambulation.   JOINT MOBILITY: Mild R knee joint hypomobility felt in all planes. Typical R patellar movements noted w/ mobilizations.    TODAY'S TREATMENT:                                                                                                                               DATE: 09/27/23  Therapeutic Exercise: Nustep x5 minutes for LE strengthening and mobility lvl 3.0  Standing squats on elevated plinth 2x10 w/ 2KG medicine ball Step ups onto 6" step w/ RLE x10 no UE support Step ups onto 8" step w/ RLE 2 x10 no UE support Lateral step ups onto 8" step w/ RLE and bilat UE support 2 x12  Seated bilat LE leg press w/ 30# 3 x8   Alternating lunges w/ 4# DB's RLE/LLE 2 x10/ each side Lateral lunges w/ 4# DB RLE/LLE x8/ each side  PATIENT EDUCATION:  Education details: HEP given to pt, prognosis Person educated: Patient Education method: Medical illustrator Education comprehension: verbalized understanding and returned demonstration  HOME EXERCISE PROGRAM:  Access Code: [email or web address] URL: [email or web address] Date: 09/24/2023 Prepared by: Ronnie Derby  Exercises - Squat with Counter Support  - 1 x daily - 3-4 x weekly - 3 sets - 8 reps - Side Stepping with Resistance at Ankles  - 1 x daily - 3-4 x weekly - 3 sets - 5 reps - Side Lunge with Counter Support  - 1 x daily - 3-4 x weekly - 3 sets - 6 reps   ASSESSMENT:  CLINICAL IMPRESSION:  Session focused on progressing pt's bilat proximal hip strength and improving her R knee strength. Pt notes significant improvements in R knee strength noted with improved activity tolerance and progression of RLE strengthening exercises at today's session. Pt able to complete step- ups onto 8" obstacle w/ the RLE signifying improved functional strength of the RLE. Pt continues to note proximal hip strength weakness, and would continue to benefit from skilled PT interventions to address these remaining deficits.   OBJECTIVE IMPAIRMENTS:  decreased balance, decreased mobility, difficulty walking, decreased ROM, decreased strength, impaired flexibility, and pain.   ACTIVITY  LIMITATIONS: carrying, lifting, squatting, and locomotion level  PARTICIPATION LIMITATIONS: community activity  PERSONAL FACTORS: Age, Past/current experiences, Time since onset of injury/illness/exacerbation, and 1 comorbidity: Osteoporosis  are also affecting patient's functional outcome.   REHAB POTENTIAL: Good  CLINICAL DECISION MAKING: Stable/uncomplicated  EVALUATION COMPLEXITY: Low   GOALS: Goals reviewed with patient? Yes  SHORT TERM GOALS: Target date: 09/17/23 Pt will be independent with HEP to improve R knee strength and decrease pain with functional activities  Baseline: 08/21/23: HEP given to pt  Goal status: INITIAL   LONG TERM GOALS: Target date: 10/02/23  Pt will improve FOTO to target score to demonstrate clinically significant improvement in functional mobility.  Baseline: 08/21/23: 52/68 Goal status: INITIAL  2.  Pt will improve 5xSTS to </= 14.8 seconds to display improvements in LE strength and equal age- matched norms.  Baseline: 08/21/23: Deferred to next session 09/24/23: 8.45 seconds Goal status: MET  3.  Pt will report <6/10 pain in R knee w/ prolonged ambulation to display clinically significant pain improvement. Baseline:08/21/23: 8/10 Goal status: INITIAL  4. Pt will improve R knee flexion AROM to 120 deg and R knee extension to 0 deg to be equal to L knee AROM for improved functional mobility.   Baseline: 08/21/23: Active ROM Right eval Left eval  Knee flexion 100 120  Knee extension 4 0   (Blank rows = not tested)  Goal status: INITIAL     PLAN:  PT FREQUENCY: 2x/week  PT DURATION: 6 weeks  PLANNED INTERVENTIONS: Therapeutic exercises, Therapeutic activity, Neuromuscular re-education, Balance training, Gait training, Patient/Family education, Self Care, Joint mobilization, Joint manipulation, Stair training, Spinal manipulation, Spinal mobilization, Cryotherapy, Moist heat, Manual therapy, and Re-evaluation  PLAN FOR NEXT SESSION:  RECERTIFICATION!  Lovie Macadamia, SPT   Gabrielle Webb. Fairly IV, PT, DPT Physical Therapist- [place]  [facility]  09/27/2023, 12:56 PM

## 2023-09-30 ENCOUNTER — Telehealth: Payer: Self-pay | Admitting: Family Medicine

## 2023-09-30 NOTE — Telephone Encounter (Signed)
Needs appointment for that please  Thanks   Cannot eval over the phone

## 2023-09-30 NOTE — Telephone Encounter (Signed)
Pt called in and would like for dr tower to give her a call back if possible regarding brain freeze pt can be reached at [phone]

## 2023-10-01 NOTE — Telephone Encounter (Signed)
Patient has been scheduled. She stated that she will be bringing some documents regarding a health bar in [place] she heard about.

## 2023-10-02 ENCOUNTER — Ambulatory Visit: Payer: PPO

## 2023-10-02 ENCOUNTER — Encounter: Payer: Self-pay | Admitting: Family Medicine

## 2023-10-02 ENCOUNTER — Ambulatory Visit (INDEPENDENT_AMBULATORY_CARE_PROVIDER_SITE_OTHER): Payer: PPO | Admitting: Family Medicine

## 2023-10-02 VITALS — BP 114/68 | HR 75 | Temp 98.2°F | Ht 63.0 in | Wt 134.1 lb

## 2023-10-02 DIAGNOSIS — F4321 Adjustment disorder with depressed mood: Secondary | ICD-10-CM | POA: Diagnosis not present

## 2023-10-02 DIAGNOSIS — U099 Post covid-19 condition, unspecified: Secondary | ICD-10-CM | POA: Diagnosis not present

## 2023-10-02 DIAGNOSIS — E039 Hypothyroidism, unspecified: Secondary | ICD-10-CM | POA: Diagnosis not present

## 2023-10-02 DIAGNOSIS — R4189 Other symptoms and signs involving cognitive functions and awareness: Secondary | ICD-10-CM | POA: Insufficient documentation

## 2023-10-02 DIAGNOSIS — R5382 Chronic fatigue, unspecified: Secondary | ICD-10-CM | POA: Diagnosis not present

## 2023-10-02 DIAGNOSIS — M25561 Pain in right knee: Secondary | ICD-10-CM | POA: Diagnosis not present

## 2023-10-02 DIAGNOSIS — M6281 Muscle weakness (generalized): Secondary | ICD-10-CM

## 2023-10-02 DIAGNOSIS — R413 Other amnesia: Secondary | ICD-10-CM | POA: Insufficient documentation

## 2023-10-02 MED ORDER — ESCITALOPRAM OXALATE 10 MG PO TABS: 10.0000 mg | ORAL_TABLET | Freq: Every day | ORAL | 2 refills | Status: DC

## 2023-10-02 NOTE — Assessment & Plan Note (Signed)
With post covid syndrone Will try lexapro to help sleep /fatigue  Encouraged socialization with breaks and also physical exercise as tolerated  Reviewed last labs Does take D, B12 and mvi as well

## 2023-10-02 NOTE — Therapy (Addendum)
OUTPATIENT PHYSICAL THERAPY LOWER EXTREMITY TREATMENT/ RE- CERTIFICATION   Patient Name: Gabrielle Webb MRN: [id number] DOB:Aug 31, 1941, 82 y.o., female Today's Date: 10/02/2023  END OF SESSION:  PT End of Session - 10/02/23 0858     Visit Number 8    Number of Visits 13    Date for PT Re-Evaluation 11/13/23    PT Start Time 0900    PT Stop Time 0943    PT Time Calculation (min) 43 min    Activity Tolerance Patient tolerated treatment well    Behavior During Therapy [facility] for tasks assessed/performed             Past Medical History:  Diagnosis Date   Allergy    Anxiety    Breast cancer (HCC) 7/12   Right   Cellulitis    Diverticulosis of colon    GERD (gastroesophageal reflux disease)    Headache    sinus   History of hiatal hernia    Hx of radiation therapy 06/20/11 - 07/11/11   right breast   Hypothyroid    Osteoporosis    Personal history of radiation therapy 2012   Right Breast Cancer   PONV (postoperative nausea and vomiting)    Skin cancer    basal and squamous cell   Vertigo    Past Surgical History:  Procedure Laterality Date   ABDOMINAL HYSTERECTOMY N/A 11/08/2016   Procedure: HYSTERECTOMY TOTAL  ABDOMINAL;  Surgeon: Marcelle Overlie, MD;  Location: [facility];  Service: Gynecology;  Laterality: N/A;   APPENDECTOMY  07/2007   back sugery  1970   BREAST BIOPSY Left    Benign   BREAST EXCISIONAL BIOPSY Left 1983   BREAST EXCISIONAL BIOPSY Left 1983   BREAST LUMPECTOMY Right 05/16/2011   COLONOSCOPY     COLONOSCOPY WITH PROPOFOL N/A 08/27/2018   Procedure: COLONOSCOPY WITH PROPOFOL;  Surgeon: Scot Jun, MD;  Location: [facility];  Service: Endoscopy;  Laterality: N/A;   COLONOSCOPY WITH PROPOFOL N/A 08/16/2021   Procedure: COLONOSCOPY WITH PROPOFOL;  Surgeon: Earline Mayotte, MD;  Location: [facility] END[facility];  Service: Endoscopy;  Laterality: N/A;   DIAGNOSTIC LAPAROSCOPY     DILATION AND CURETTAGE OF UTERUS N/A 05/31/2014   Procedure:  DILATATION AND CURETTAGE WITH ULTRASOUND GUIDANCE;  Surgeon: Jeani Hawking, MD;  Location: [facility];  Service: Gynecology;  Laterality: N/A;   ESOPHAGOGASTRODUODENOSCOPY  04/20/2003   KNEE SURGERY  1962   MASTECTOMY     right eye surgery     SALPINGOOPHORECTOMY Bilateral 11/08/2016   Procedure: BILATERAL SALPINGO OOPHORECTOMY;  Surgeon: Marcelle Overlie, MD;  Location: [facility];  Service: Gynecology;  Laterality: Bilateral;   UPPER GI ENDOSCOPY     Patient Active Problem List   Diagnosis Date Noted   Brain fog 10/02/2023   Left inguinal pain 08/21/2023   LLQ pain 08/21/2023   Normocytic anemia 07/09/2023   Post covid-19 condition, unspecified 07/05/2023   Dark stools 07/05/2023   Hypokalemia 06/30/2023   COVID-19 virus infection 06/20/2023   Skin lesion 05/02/2023   Right knee pain 04/24/2023   Vaginal pain 01/18/2023   Grief reaction 07/04/2022   Encounter for screening mammogram for breast cancer 12/18/2021   Anxious mood as adjustment reaction 06/09/2021   Breast lump on right side at 8 o'clock position 05/03/2020   History of breast cancer 05/03/2020   Medicare annual wellness visit, subsequent 05/28/2018   Estrogen deficiency 05/28/2018   Fatigue 05/22/2017   Chest discomfort 05/22/2017   S/P TAH-BSO 11/08/2016  Routine general medical examination at a health care facility 05/06/2016   Malignant neoplasm of upper-outer quadrant of right breast in female, estrogen receptor positive (HCC) 03/21/2015   History of tamoxifen therapy 05/18/2014   Encounter for routine gynecological examination 05/06/2013   Colon cancer screening 05/06/2013   Encounter for Medicare annual wellness exam 04/28/2013   Hx of radiation therapy    Hx of Breast cancer, stage 1, Right, UOQ, Receptor+,Her2- 05/29/2011    Class: Stage 1   Other screening mammogram 03/21/2011   Post-menopausal 03/21/2011   Hypothyroidism 01/23/2008   Pure hypercholesterolemia 01/23/2008   Allergic rhinitis 01/23/2008    GERD 01/23/2008   Diverticulosis of colon 01/23/2008   IBS 01/23/2008   FIBROCYSTIC BREAST DISEASE 01/23/2008   Osteoporosis 10/17/2007    PCP: Judy Pimple MD  REFERRING PROVIDER: Marcelle Overlie, MD  REFERRING DIAG:  [phone] (ICD-10-CM) - Pain in right knee    THERAPY DIAG:  Right knee pain, unspecified chronicity  Muscle weakness (generalized)  Rationale for Evaluation and Treatment: Rehabilitation  ONSET DATE: April, 2024   SUBJECTIVE:   SUBJECTIVE STATEMENT: Pt reports no pain in the R knee at today's session. Pt reports being HEP compliant. No notable changes over the weekend.   PERTINENT HISTORY: Pt reports subacute R knee pain. This pain began following passive R knee flexion to end range during a pelvic PT visit, four months ago (April, 2024) and has continued to sustain the same level of pain since then. Pain reported at the anterior medial aspect of the R knee. Pain described as stabbing and sharp. Pt's pain with no activity is 0/10 NPS and 8-9/10 NPS with activity. Pt denies radicular symptoms in RLE, however does have LLE N/T. Pt has joint crepitus, popping and clicking in the knee joint. Pt's aggravating factors include prolonged walking, and stairs. The pain is relieved with a RLE knee sleeve, ice and OTC medications. Pt complaints of "pulling" when she flexes her knee and buckling of the knees 2/2 to weakness w/ ambulation.  PAIN:  Are you having pain? Yes: NPRS scale: 0/10 Pain location: Medial aspect of the anterior R knee Pain description: Sharp, stabbing pain  Aggravating factors: Prolonged walking, stairs  Relieving factors: Sleeve, OTC medications, ice  PRECAUTIONS: Fall  RED FLAGS: None   WEIGHT BEARING RESTRICTIONS: No  FALLS:  Has patient fallen in last 6 months? No  No stairs  OCCUPATION: Retired  PLOF: Independent  PATIENT GOALS: Want to walk without pain and reduce knee buckling.   NEXT MD VISIT: N/A for the knee   OBJECTIVE:    DIAGNOSTIC FINDINGS:  EXAM: RIGHT KNEE - COMPLETE 4+ VIEW   COMPARISON:  None Available.   FINDINGS: No acute fracture or dislocation. Small joint effusion. Moderate to severe lateral and mild medial compartment joint space narrowing. Patellofemoral joint space is relatively preserved. Small tricompartmental marginal osteophytes. Osteopenia. Chondrocalcinosis of the menisci. Soft tissues are unremarkable.   Single AP view of the left knee is unremarkable, other than chondrocalcinosis of the menisci.   IMPRESSION: 1. Tricompartmental osteoarthritis, moderate to severe in the lateral compartment.  PATIENT SURVEYS:  FOTO 52/68  COGNITION: Overall cognitive status: Within functional limits for tasks assessed     SENSATION: WFL   MUSCLE LENGTH: Hamstrings: Limited RLE/LLE <90 deg.    POSTURE:  Increased knee valgus of the R knee.   PALPATION: No TTP noted at the anterior or posterior med/ lat oint line   LOWER EXTREMITY ROM:  Active ROM Right  eval Left eval  Knee flexion 100 120  Knee extension 4 0   (Blank rows = not tested)  LOWER EXTREMITY MMT:  MMT Right eval Left eval  Hip flexion 4- 4  Hip extension 2+ 2+  Hip abduction 4- 4-  Hip adduction 4- 4-  Hip internal rotation 4- 4  Hip external rotation 4 4  Knee flexion 4 4  Knee extension 4 4  Ankle dorsiflexion 4+ 4+  Ankle plantarflexion 4 4   (Blank rows = not tested)  LOWER EXTREMITY SPECIAL TESTS: (R) Knee special tests: Anterior drawer test: negative, Posterior drawer test: negative, and Valgus stress test: negative, Varus stress test: negative   FUNCTIONAL TESTS:  5 times sit to stand: Pt unable to perform 5x STS 2/2 R knee pain, defer to next session  5xSTS: 8.45 seconds 30 sec STS: 15  : 1600 ft 0/10NPS RLE   GAIT: Increased R knee valgus noted w/ ambulation.   JOINT MOBILITY: Mild R knee joint hypomobility felt in all planes. Typical R patellar movements noted w/ mobilizations.     TODAY'S TREATMENT:                                                                                                                              DATE: 10/02/23  Beginning of session spent reassessing pt's goals and POC to complete re-cert. (See below)  Assessments completed:   30 second STS: 15 reps  : 1652ft w/ 0/10NPS in the R knee  3 Rep Max: L- 35# R- 20#  PATIENT EDUCATION:  Education details: HEP given to pt, prognosis Person educated: Patient Education method: Medical illustrator Education comprehension: verbalized understanding and returned demonstration  HOME EXERCISE PROGRAM:  Access Code: [email or web address] URL: [email or web address] Date: 09/24/2023 Prepared by: Ronnie Derby  Exercises - Squat with Counter Support  - 1 x daily - 3-4 x weekly - 3 sets - 8 reps - Side Stepping with Resistance at Ankles  - 1 x daily - 3-4 x weekly - 3 sets - 5 reps - Side Lunge with Counter Support  - 1 x daily - 3-4 x weekly - 3 sets - 6 reps   ASSESSMENT:  CLINICAL IMPRESSION:  Session focused on reassessing pt's LTG and STG's as pt requiring re-certification for continued skilled PT at today's session. Pt notes great progression with PT, achieving 4/6 of her STG/ LTG's including being HEP compliant, 5xSTS goal, reducing overall R knee pain, exceeding in the , and improving R knee AROM. These goals signify the pt's improved bilat LE strength and functional mobility that she has made so far in PT. Pt has yet to achieve her target FOTO score, or her 3 RM goal noting a significant difference in strength between the RLE and the LLE limiting full functional mobility. Pt and PT agree that pt would continue to benefit from skilled PT interventions to address remaining RLE strength deficits.   OBJECTIVE IMPAIRMENTS: decreased balance, decreased  mobility, difficulty walking, decreased ROM, decreased strength, impaired flexibility, and pain.   ACTIVITY LIMITATIONS:  carrying, lifting, squatting, and locomotion level  PARTICIPATION LIMITATIONS: community activity  PERSONAL FACTORS: Age, Past/current experiences, Time since onset of injury/illness/exacerbation, and 1 comorbidity: Osteoporosis  are also affecting patient's functional outcome.   REHAB POTENTIAL: Good  CLINICAL DECISION MAKING: Stable/uncomplicated  EVALUATION COMPLEXITY: Low   GOALS: Goals reviewed with patient? Yes  SHORT TERM GOALS: Target date: 09/17/23 Pt will be independent with HEP to improve R knee strength and decrease pain with functional activities  Baseline: 08/21/23: HEP given to pt 10/02/23: HEP compliant Goal status: MET   LONG TERM GOALS: Target date: 11/13/23  Pt will improve FOTO to target score to demonstrate clinically significant improvement in functional mobility.  Baseline: 08/21/23: 52/68 10/02/23: 57/68 Goal status: ONGOING  2.  Pt will improve 5xSTS to </= 14.8 seconds to display improvements in LE strength and equal age- matched norms.  Baseline: 08/21/23: Deferred to next session 09/24/23: 8.45 seconds Goal status: MET  3.  Pt will report <6/10 pain in R knee w/ prolonged ambulation to display clinically significant pain improvement. Baseline:08/21/23: 8/10, 10, 10/02/23: 2/10 Goal status: MET  4. Pt will improve R knee flexion AROM to 120 deg and R knee extension to 0 deg to be equal to L knee AROM for improved functional mobility.   Baseline: 08/21/23: Active ROM Right eval Left eval Right 10/30  Knee flexion 100 120 124  Knee extension 4 0 0   (Blank rows = not tested)  Goal status: MET   5. Pt will improve RLE 3 rep max to be >/+ to the LLE to demonstrate functional strength improvements of the RLE.  Baseline: 09/06/23: RLE- 20# LLE- 35#  Goal status: INITIAL    PLAN:  PT FREQUENCY: 2x/week  PT DURATION: 6 weeks  PLANNED INTERVENTIONS: Therapeutic exercises, Therapeutic activity, Neuromuscular re-education, Balance training, Gait  training, Patient/Family education, Self Care, Joint mobilization, Joint manipulation, Stair training, Spinal manipulation, Spinal mobilization, Cryotherapy, Moist heat, Manual therapy, and Re-evaluation  PLAN FOR NEXT SESSION: Continue to progress bilat LE strengthening (RLE> LLE)   Lovie Macadamia, SPT   Delphia Grates. Fairly IV, PT, DPT Physical Therapist- [place]  [facility]  10/02/2023, 1:11 PM

## 2023-10-02 NOTE — Assessment & Plan Note (Signed)
Lab Results  Component Value Date   TSH 2.23 06/27/2022   Stable with levothyroxine 75 mcg daily

## 2023-10-02 NOTE — Assessment & Plan Note (Signed)
Mother and a friend lost recently  Encouraged counseling-she will consider maybe in future  Encouraged socialization and self care

## 2023-10-02 NOTE — Assessment & Plan Note (Signed)
Worse since covid in July  Some insomnia as well as cognitive slow down  Reassuring labs most recently   Will try re start lexapro for sleep / better sleep may help energy level  Antic guidance given

## 2023-10-02 NOTE — Assessment & Plan Note (Signed)
Pt suffers from fatigue, insomnia and brain fog (with some word searching)  Discussed plan including Good nutrition Self care  Begin exercise slowly  Socialization   Will also try lexapro 10 to see if it helps with sleep and mood Discussed expectations of SSRI medication including time to effectiveness and mechanism of action, also poss of side effects (early and late)- including mental fuzziness, weight or appetite change, nausea and poss of worse dep or anxiety (even suicidal thoughts)  Pt voiced understanding and will stop med and update if this occurs

## 2023-10-02 NOTE — Patient Instructions (Addendum)
The most important thing to do for brain fog is exercise your brain  Word puzzles Reading  Math   Socialize as much as possible even if it tires you out   We can try lexapro 10 mg for sleep  See how this works    Walking is good for exercise  Add some strength training to your routine, this is important for bone and brain health and can reduce your risk of falls and help your body use insulin properly and regulate weight  Light weights, exercise bands , and internet videos are a good way to start  Yoga (chair or regular), machines , floor exercises or a gym with machines are also good options    You have been through a lot  If you want a referral for mental health counseling in the future I can refer and /or give you some resource numbers    Follow up in 4-6 weeks   Take care of yourself

## 2023-10-02 NOTE — Progress Notes (Signed)
Subjective:    Patient ID: Gabrielle Webb, female    DOB: 09-May-1941, 82 y.o.   MRN: [id number]  HPI  Wt Readings from Last 3 Encounters:  10/02/23 134 lb 2 oz (60.8 kg)  08/21/23 132 lb (59.9 kg)  07/05/23 132 lb (59.9 kg)   23.76 kg/m  Vitals:   10/02/23 1134  BP: 114/68  Pulse: 75  Temp: 98.2 F (36.8 C)  SpO2: 97%   Pt presents for c/o Brain fog Fatigue  Sleep problems   Had covid in July   Not sleeping well at night   Trouble with word finding more than usual  Cognitive slowing  Has made her feel uneasy   Worried about being a burden to family in the future   Is aware of a place called the [facility]" where they get vitamins and minerals   Lexapro- has not been taking it lately , 5 mg   Exercise Doing PT for knee  Nothing additionally    Grief Lost mother  A fried died also  Hard to handle      07/02/2023    9:01 AM 05/02/2023    8:27 AM 09/20/2022    8:49 AM 07/25/2022    2:06 PM 07/04/2022    8:48 AM  Depression screen PHQ 2/9  Decreased Interest 0 0 0 0 0  Down, Depressed, Hopeless 0 0 0 0 0  PHQ - 2 Score 0 0 0 0 0  Altered sleeping  0     Tired, decreased energy  0     Change in appetite  0     Feeling bad or failure about yourself   0     Trouble concentrating  0     Moving slowly or fidgety/restless  0     Suicidal thoughts  0     PHQ-9 Score  0     Difficult doing work/chores  Not difficult at all         Lab Results  Component Value Date   TSH 2.23 06/27/2022   Lab Results  Component Value Date   WBC 8.6 08/21/2023   HGB 12.6 08/21/2023   HCT 38.3 08/21/2023   MCV 91.4 08/21/2023   PLT 179.0 08/21/2023   Lab Results  Component Value Date   NA 140 08/21/2023   K 4.0 08/21/2023   CO2 30 08/21/2023   GLUCOSE 90 08/21/2023   BUN 14 08/21/2023   CREATININE 0.75 08/21/2023   CALCIUM 9.6 08/21/2023   GFR 75.28 08/21/2023   EGFR 82 (L) 07/03/2016   GFRNONAA >60 06/25/2023   Lab Results  Component Value Date    ALT 10 08/21/2023   AST 19 08/21/2023   ALKPHOS 55 08/21/2023   BILITOT 0.8 08/21/2023    Takes oral B12  No results found for: "VITAMINB12"    Patient Active Problem List   Diagnosis Date Noted   Brain fog 10/02/2023   Left inguinal pain 08/21/2023   LLQ pain 08/21/2023   Normocytic anemia 07/09/2023   Post covid-19 condition, unspecified 07/05/2023   Dark stools 07/05/2023   Hypokalemia 06/30/2023   COVID-19 virus infection 06/20/2023   Skin lesion 05/02/2023   Right knee pain 04/24/2023   Vaginal pain 01/18/2023   Grief reaction 07/04/2022   Encounter for screening mammogram for breast cancer 12/18/2021   Anxious mood as adjustment reaction 06/09/2021   Breast lump on right side at 8 o'clock position 05/03/2020   History of breast cancer 05/03/2020  Medicare annual wellness visit, subsequent 05/28/2018   Estrogen deficiency 05/28/2018   Fatigue 05/22/2017   Chest discomfort 05/22/2017   S/P TAH-BSO 11/08/2016   Routine general medical examination at a health care facility 05/06/2016   Malignant neoplasm of upper-outer quadrant of right breast in female, estrogen receptor positive (HCC) 03/21/2015   History of tamoxifen therapy 05/18/2014   Encounter for routine gynecological examination 05/06/2013   Colon cancer screening 05/06/2013   Encounter for Medicare annual wellness exam 04/28/2013   Hx of radiation therapy    Hx of Breast cancer, stage 1, Right, UOQ, Receptor+,Her2- 05/29/2011    Class: Stage 1   Other screening mammogram 03/21/2011   Post-menopausal 03/21/2011   Hypothyroidism 01/23/2008   Pure hypercholesterolemia 01/23/2008   Allergic rhinitis 01/23/2008   GERD 01/23/2008   Diverticulosis of colon 01/23/2008   IBS 01/23/2008   FIBROCYSTIC BREAST DISEASE 01/23/2008   Osteoporosis 10/17/2007   Past Medical History:  Diagnosis Date   Allergy    Anxiety    Breast cancer (HCC) 7/12   Right   Cellulitis    Diverticulosis of colon    GERD  (gastroesophageal reflux disease)    Headache    sinus   History of hiatal hernia    Hx of radiation therapy 06/20/11 - 07/11/11   right breast   Hypothyroid    Osteoporosis    Personal history of radiation therapy 2012   Right Breast Cancer   PONV (postoperative nausea and vomiting)    Skin cancer    basal and squamous cell   Vertigo    Past Surgical History:  Procedure Laterality Date   ABDOMINAL HYSTERECTOMY N/A 11/08/2016   Procedure: HYSTERECTOMY TOTAL  ABDOMINAL;  Surgeon: Marcelle Overlie, MD;  Location: [facility];  Service: Gynecology;  Laterality: N/A;   APPENDECTOMY  07/2007   back sugery  1970   BREAST BIOPSY Left    Benign   BREAST EXCISIONAL BIOPSY Left 1983   BREAST EXCISIONAL BIOPSY Left 1983   BREAST LUMPECTOMY Right 05/16/2011   COLONOSCOPY     COLONOSCOPY WITH PROPOFOL N/A 08/27/2018   Procedure: COLONOSCOPY WITH PROPOFOL;  Surgeon: Scot Jun, MD;  [facility];  Service: Endoscopy;  Laterality: N/A;   COLONOSCOPY WITH PROPOFOL N/A 08/16/2021   Procedure: COLONOSCOPY WITH PROPOFOL;  Surgeon: Earline Mayotte, MD;  Location: [facility] ENDOSCOPY;  Service: Endoscopy;  Laterality: N/A;   DIAGNOSTIC LAPAROSCOPY     DILATION AND CURETTAGE OF UTERUS N/A 05/31/2014   Procedure: DILATATION AND CURETTAGE WITH ULTRASOUND GUIDANCE;  Surgeon: Jeani Hawking, MD;  Location: [facility];  Service: Gynecology;  Laterality: N/A;   ESOPHAGOGASTRODUODENOSCOPY  04/20/2003   KNEE SURGERY  1962   MASTECTOMY     right eye surgery     SALPINGOOPHORECTOMY Bilateral 11/08/2016   Procedure: BILATERAL SALPINGO OOPHORECTOMY;  Surgeon: Marcelle Overlie, MD;  Location: [facility];  Service: Gynecology;  Laterality: Bilateral;   UPPER GI ENDOSCOPY     Social History   Tobacco Use   Smoking status: Never   Smokeless tobacco: Never  Vaping Use   Vaping status: Never Used  Substance Use Topics   Alcohol use: No    Alcohol/week: 0.0 standard drinks of alcohol   Drug use: No    Family History  Problem Relation Age of Onset   Atrial fibrillation Mother    Coronary artery disease Mother    Congenital heart disease Mother    Heart attack Father 55   Hypertension Father  Diabetes Maternal Aunt    Cancer Maternal Aunt        leukemia   Diabetes Maternal Uncle    Cancer Maternal Uncle        colon   Diabetes Other        Grandmother   Coronary artery disease Other        Grandmother   Uterine cancer Other        Grandmother   Leukemia Other        Aunt   Allergies  Allergen Reactions   Codeine Nausea And Vomiting   Fentanyl Nausea And Vomiting   Hydromorphone Nausea And Vomiting        Morphine Nausea And Vomiting   Ciprofloxacin     Intolerant- tingling    Tape     Surgical tape   Prednisone Palpitations    Rapid hear beat   Current Outpatient Medications on File Prior to Visit  Medication Sig Dispense Refill   atorvastatin (LIPITOR) 10 MG tablet TAKE 1 TABLET BY MOUTH DAILY 90 tablet 0   BIOTIN PO Take by mouth daily.     CALCIUM PO Take 1,500 mg by mouth daily.     carboxymethylcellulose (REFRESH PLUS) 0.5 % SOLN 1 drop 3 (three) times daily as needed.     chlorthalidone (HYGROTON) 25 MG tablet TAKE TWO TABLETS EVERY DAY AS NEEDED FORSWELLING 180 tablet 0   Cholecalciferol (VITAMIN D) 2000 UNITS CAPS Take 1 capsule by mouth daily.     Cyanocobalamin (B-12 PO) Take 2,500 mcg by mouth daily.     denosumab (PROLIA) 60 MG/ML SOSY injection Inject 60 mg into the skin every 6 (six) months. 1 mL 0   diclofenac sodium (VOLTAREN) 1 % GEL Apply 2 g topically 4 (four) times daily. Rub into affected area of foot 2 to 4 times daily (Patient taking differently: Apply 2 g topically as needed. Rub into affected area of foot 2 to 4 times daily) 100 g 2   dicyclomine (BENTYL) 10 MG capsule TAKE 1 CAPSULE BY MOUTH TWICE DAILY FOR SPASMS 180 capsule 2   famotidine (PEPCID) 20 MG tablet TAKE ONE TABLET EVERY DAY AS NEEDED FOR HEARTBURN OR INDIGESTION 90  tablet 3   Flaxseed, Linseed, (FLAX SEED OIL PO) Take by mouth. 2 a day     fluticasone (FLONASE) 50 MCG/ACT nasal spray SPRAY TWICE INTO EACH NOSTRIL ONCE DAILY 18 g 2   levothyroxine (SYNTHROID) 75 MCG tablet TAKE ONE TABLET BY MOUTH DAILY BEFORE BREAKFAST 90 tablet 3   Multiple Vitamin (MULTIVITAMIN) capsule Take 1 capsule by mouth daily.     Propylene Glycol (SYSTANE COMPLETE OP) Apply to eye.     No current facility-administered medications on file prior to visit.    Review of Systems  Constitutional:  Positive for fatigue. Negative for activity change, appetite change, fever and unexpected weight change.  HENT:  Negative for congestion, ear pain, rhinorrhea, sinus pressure and sore throat.   Eyes:  Negative for pain, redness and visual disturbance.  Respiratory:  Negative for cough, shortness of breath and wheezing.   Cardiovascular:  Negative for chest pain and palpitations.  Gastrointestinal:  Negative for abdominal pain, blood in stool, constipation and diarrhea.  Endocrine: Negative for polydipsia and polyuria.  Genitourinary:  Negative for dysuria, frequency and urgency.  Musculoskeletal:  Negative for arthralgias, back pain and myalgias.  Skin:  Negative for pallor and rash.  Allergic/Immunologic: Negative for environmental allergies.  Neurological:  Negative for dizziness, syncope and  headaches.  Hematological:  Negative for adenopathy. Does not bruise/bleed easily.  Psychiatric/Behavioral:  Positive for decreased concentration and sleep disturbance. Negative for dysphoric mood. The patient is not nervous/anxious.        Grief        Objective:   Physical Exam Constitutional:      General: She is not in acute distress.    Appearance: Normal appearance. She is well-developed and normal weight. She is not ill-appearing or diaphoretic.  HENT:     Head: Normocephalic and atraumatic.  Eyes:     Conjunctiva/sclera: Conjunctivae normal.     Pupils: Pupils are equal,  round, and reactive to light.  Neck:     Thyroid: No thyromegaly.     Vascular: No carotid bruit or JVD.  Cardiovascular:     Rate and Rhythm: Normal rate and regular rhythm.     Heart sounds: Normal heart sounds.     No gallop.  Pulmonary:     Effort: Pulmonary effort is normal. No respiratory distress.     Breath sounds: No rales.  Abdominal:     General: Abdomen is flat. There is no abdominal bruit.  Musculoskeletal:     Cervical back: Normal range of motion and neck supple.     Right lower leg: No edema.     Left lower leg: No edema.  Lymphadenopathy:     Cervical: No cervical adenopathy.  Skin:    General: Skin is warm and dry.     Coloration: Skin is not jaundiced or pale.     Findings: No bruising or rash.  Neurological:     Mental Status: She is alert.     Coordination: Coordination normal.     Deep Tendon Reflexes: Reflexes are normal and symmetric. Reflexes normal.  Psychiatric:        Attention and Perception: Attention normal.        Mood and Affect: Mood is anxious. Affect is not labile or tearful.        Speech: Speech normal.        Behavior: Behavior normal.        Cognition and Memory: Cognition and memory normal.     Comments: Mildly anxious Candidly discusses symptoms and stressors   No difficulty with word finding or speech            Assessment & Plan:   Problem List Items Addressed This Visit       Endocrine   Hypothyroidism    Lab Results  Component Value Date   TSH 2.23 06/27/2022   Stable with levothyroxine 75 mcg daily        Other   Brain fog    With post covid syndrone Will try lexapro to help sleep /fatigue  Encouraged socialization with breaks and also physical exercise as tolerated  Reviewed last labs Does take D, B12 and mvi as well      Fatigue    Worse since covid in July  Some insomnia as well as cognitive slow down  Reassuring labs most recently   Will try re start lexapro for sleep / better sleep may help  energy level  Antic guidance given      Grief reaction    Mother and a friend lost recently  Encouraged counseling-she will consider maybe in future  Encouraged socialization and self care       Post covid-19 condition, unspecified - Primary    Pt suffers from fatigue, insomnia and brain fog (with some  word searching)  Discussed plan including Good nutrition Self care  Begin exercise slowly  Socialization   Will also try lexapro 10 to see if it helps with sleep and mood Discussed expectations of SSRI medication including time to effectiveness and mechanism of action, also poss of side effects (early and late)- including mental fuzziness, weight or appetite change, nausea and poss of worse dep or anxiety (even suicidal thoughts)  Pt voiced understanding and will stop med and update if this occurs

## 2023-10-04 ENCOUNTER — Ambulatory Visit: Payer: PPO | Attending: Obstetrics and Gynecology

## 2023-10-04 DIAGNOSIS — M6281 Muscle weakness (generalized): Secondary | ICD-10-CM | POA: Insufficient documentation

## 2023-10-04 DIAGNOSIS — M25561 Pain in right knee: Secondary | ICD-10-CM | POA: Diagnosis not present

## 2023-10-04 NOTE — Therapy (Addendum)
OUTPATIENT PHYSICAL THERAPY LOWER EXTREMITY TREATMENT   Patient Name: Gabrielle Webb MRN: [id number] DOB:[birth year], 82 y.o., female Today's Date: 10/04/2023  END OF SESSION:  PT End of Session - 10/04/23 0944     Visit Number 9    Number of Visits 13    Date for PT Re-Evaluation 11/13/23    PT Start Time 0944    PT Stop Time 1027    PT Time Calculation (min) 43 min    Activity Tolerance Patient tolerated treatment well    Behavior During Therapy [facility] for tasks assessed/performed             Past Medical History:  Diagnosis Date   Allergy    Anxiety    Breast cancer (HCC) 7/12   Right   Cellulitis    Diverticulosis of colon    GERD (gastroesophageal reflux disease)    Headache    sinus   History of hiatal hernia    Hx of radiation therapy 06/20/11 - 07/11/11   right breast   Hypothyroid    Osteoporosis    Personal history of radiation therapy 2012   Right Breast Cancer   PONV (postoperative nausea and vomiting)    Skin cancer    basal and squamous cell   Vertigo    Past Surgical History:  Procedure Laterality Date   ABDOMINAL HYSTERECTOMY N/A 11/08/2016   Procedure: HYSTERECTOMY TOTAL  ABDOMINAL;  Surgeon: Marcelle Overlie, MD;  Location: [facility];  Service: Gynecology;  Laterality: N/A;   APPENDECTOMY  07/2007   back sugery  1970   BREAST BIOPSY Left    Benign   BREAST EXCISIONAL BIOPSY Left 1983   BREAST EXCISIONAL BIOPSY Left 1983   BREAST LUMPECTOMY Right 05/16/2011   COLONOSCOPY     COLONOSCOPY WITH PROPOFOL N/A 08/27/2018   Procedure: COLONOSCOPY WITH PROPOFOL;  Surgeon: Scot Jun, MD;  Location: [facility];  Service: Endoscopy;  Laterality: N/A;   COLONOSCOPY WITH PROPOFOL N/A 08/16/2021   Procedure: COLONOSCOPY WITH PROPOFOL;  Surgeon: Earline Mayotte, MD;  Location: [facility] ENDOSCOPY;  Service: Endoscopy;  Laterality: N/A;   DIAGNOSTIC LAPAROSCOPY     DILATION AND CURETTAGE OF UTERUS N/A 05/31/2014   Procedure: DILATATION AND CURETTAGE  WITH ULTRASOUND GUIDANCE;  Surgeon: Jeani Hawking, MD;  Location: [facility];  Service: Gynecology;  Laterality: N/A;   ESOPHAGOGASTRODUODENOSCOPY  04/20/2003   KNEE SURGERY  1962   MASTECTOMY     right eye surgery     SALPINGOOPHORECTOMY Bilateral 11/08/2016   Procedure: BILATERAL SALPINGO OOPHORECTOMY;  Surgeon: Marcelle Overlie, MD;  Location: [facility];  Service: Gynecology;  Laterality: Bilateral;   UPPER GI ENDOSCOPY     Patient Active Problem List   Diagnosis Date Noted   Brain fog 10/02/2023   Left inguinal pain 08/21/2023   LLQ pain 08/21/2023   Normocytic anemia 07/09/2023   Post covid-19 condition, unspecified 07/05/2023   Dark stools 07/05/2023   Hypokalemia 06/30/2023   COVID-19 virus infection 06/20/2023   Skin lesion 05/02/2023   Right knee pain 04/24/2023   Vaginal pain 01/18/2023   Grief reaction 07/04/2022   Encounter for screening mammogram for breast cancer 12/18/2021   Anxious mood as adjustment reaction 06/09/2021   Breast lump on right side at 8 o'clock position 05/03/2020   History of breast cancer 05/03/2020   Medicare annual wellness visit, subsequent 05/28/2018   Estrogen deficiency 05/28/2018   Fatigue 05/22/2017   Chest discomfort 05/22/2017   S/P TAH-BSO 11/08/2016   Routine  general medical examination at a health care facility 05/06/2016   Malignant neoplasm of upper-outer quadrant of right breast in female, estrogen receptor positive (HCC) 03/21/2015   History of tamoxifen therapy 05/18/2014   Encounter for routine gynecological examination 05/06/2013   Colon cancer screening 05/06/2013   Encounter for Medicare annual wellness exam 04/28/2013   Hx of radiation therapy    Hx of Breast cancer, stage 1, Right, UOQ, Receptor+,Her2- 05/29/2011    Class: Stage 1   Other screening mammogram 03/21/2011   Post-menopausal 03/21/2011   Hypothyroidism 01/23/2008   Pure hypercholesterolemia 01/23/2008   Allergic rhinitis 01/23/2008   GERD 01/23/2008    Diverticulosis of colon 01/23/2008   IBS 01/23/2008   FIBROCYSTIC BREAST DISEASE 01/23/2008   Osteoporosis 10/17/2007    PCP: Judy Pimple MD  REFERRING PROVIDER: Marcelle Overlie, MD  REFERRING DIAG:  [phone] (ICD-10-CM) - Pain in right knee    THERAPY DIAG:  Right knee pain, unspecified chronicity  Muscle weakness (generalized)  Rationale for Evaluation and Treatment: Rehabilitation  ONSET DATE: April, 2024   SUBJECTIVE:   SUBJECTIVE STATEMENT: Pt reports no pain in the R knee at today's session. She reports mild lateral R knee pain following last visit which has since subsided.   PERTINENT HISTORY: Pt reports subacute R knee pain. This pain began following passive R knee flexion to end range during a pelvic PT visit, four months ago (April, 2024) and has continued to sustain the same level of pain since then. Pain reported at the anterior medial aspect of the R knee. Pain described as stabbing and sharp. Pt's pain with no activity is 0/10 NPS and 8-9/10 NPS with activity. Pt denies radicular symptoms in RLE, however does have LLE N/T. Pt has joint crepitus, popping and clicking in the knee joint. Pt's aggravating factors include prolonged walking, and stairs. The pain is relieved with a RLE knee sleeve, ice and OTC medications. Pt complaints of "pulling" when she flexes her knee and buckling of the knees 2/2 to weakness w/ ambulation.  PAIN:  Are you having pain? Yes: NPRS scale: 0/10 Pain location: Medial aspect of the anterior R knee Pain description: Sharp, stabbing pain  Aggravating factors: Prolonged walking, stairs  Relieving factors: Sleeve, OTC medications, ice  PRECAUTIONS: Fall  RED FLAGS: None   WEIGHT BEARING RESTRICTIONS: No  FALLS:  Has patient fallen in last 6 months? No  No stairs  OCCUPATION: Retired  PLOF: Independent  PATIENT GOALS: Want to walk without pain and reduce knee buckling.   NEXT MD VISIT: N/A for the knee   OBJECTIVE:    DIAGNOSTIC FINDINGS:  EXAM: RIGHT KNEE - COMPLETE 4+ VIEW   COMPARISON:  None Available.   FINDINGS: No acute fracture or dislocation. Small joint effusion. Moderate to severe lateral and mild medial compartment joint space narrowing. Patellofemoral joint space is relatively preserved. Small tricompartmental marginal osteophytes. Osteopenia. Chondrocalcinosis of the menisci. Soft tissues are unremarkable.   Single AP view of the left knee is unremarkable, other than chondrocalcinosis of the menisci.   IMPRESSION: 1. Tricompartmental osteoarthritis, moderate to severe in the lateral compartment.  PATIENT SURVEYS:  FOTO 52/68  COGNITION: Overall cognitive status: Within functional limits for tasks assessed     SENSATION: WFL   MUSCLE LENGTH: Hamstrings: Limited RLE/LLE <90 deg.    POSTURE:  Increased knee valgus of the R knee.   PALPATION: No TTP noted at the anterior or posterior med/ lat oint line   LOWER EXTREMITY ROM:  Active  ROM Right eval Left eval  Knee flexion 100 120  Knee extension 4 0   (Blank rows = not tested)  LOWER EXTREMITY MMT:  MMT Right eval Left eval  Hip flexion 4- 4  Hip extension 2+ 2+  Hip abduction 4- 4-  Hip adduction 4- 4-  Hip internal rotation 4- 4  Hip external rotation 4 4  Knee flexion 4 4  Knee extension 4 4  Ankle dorsiflexion 4+ 4+  Ankle plantarflexion 4 4   (Blank rows = not tested)  LOWER EXTREMITY SPECIAL TESTS: (R) Knee special tests: Anterior drawer test: negative, Posterior drawer test: negative, and Valgus stress test: negative, Varus stress test: negative   FUNCTIONAL TESTS:  5 times sit to stand: Pt unable to perform 5x STS 2/2 R knee pain, defer to next session  5xSTS: 8.45 seconds 30 sec STS: 15  : 1600 ft 0/10NPS RLE   GAIT: Increased R knee valgus noted w/ ambulation.   JOINT MOBILITY: Mild R knee joint hypomobility felt in all planes. Typical R patellar movements noted w/ mobilizations.     TODAY'S TREATMENT:                                                                                                                              DATE: 10/04/23  Nustep x5 minutes for LE strengthening and mobility lvl 3.0 Lateral banded walks with blue TB 5 x10' down and back  Standing elevated plinth mini squats w/ 2KG medicine ball 2 x12 Eccentric step downs off of 4" step w/ contralateral LE toe tap RLE/LLE 2 x10/ each side Seated knee extension at OMEGA machine 3 x8 10# Seated knee flexion at OMEGA machine 3x8 20# Lateral lunges w/ 5# DB RLE/LLE x10/ each side  PATIENT EDUCATION:  Education details: HEP given to pt, prognosis Person educated: Patient Education method: Medical illustrator Education comprehension: verbalized understanding and returned demonstration  HOME EXERCISE PROGRAM:  Access Code: [email or web address] URL: [email or web address] Date: 09/24/2023 Prepared by: Ronnie Derby  Exercises - Squat with Counter Support  - 1 x daily - 3-4 x weekly - 3 sets - 8 reps - Side Stepping with Resistance at Ankles  - 1 x daily - 3-4 x weekly - 3 sets - 5 reps - Side Lunge with Counter Support  - 1 x daily - 3-4 x weekly - 3 sets - 6 reps   ASSESSMENT:  CLINICAL IMPRESSION:  Session focused on progressing bilateral LE strengthening and SLS activities. Pt notes improvements in bilat LE strength displayed with improved activity tolerance to exercises at today's session including eccentric step downs from 4" step and increased weight tolerance with knee flexion/ ext activities at the [facility] machine. Pt continues to note strength differentials between the RLE and the LLE and would continue to benefit from skilled PT interventions to address remaining RLE strength deficits to improve QoL and PLOF.    OBJECTIVE IMPAIRMENTS: decreased balance, decreased mobility, difficulty walking, decreased  ROM, decreased strength, impaired flexibility, and pain.   ACTIVITY  LIMITATIONS: carrying, lifting, squatting, and locomotion level  PARTICIPATION LIMITATIONS: community activity  PERSONAL FACTORS: Age, Past/current experiences, Time since onset of injury/illness/exacerbation, and 1 comorbidity: Osteoporosis  are also affecting patient's functional outcome.   REHAB POTENTIAL: Good  CLINICAL DECISION MAKING: Stable/uncomplicated  EVALUATION COMPLEXITY: Low   GOALS: Goals reviewed with patient? Yes  SHORT TERM GOALS: Target date: 09/17/23 Pt will be independent with HEP to improve R knee strength and decrease pain with functional activities  Baseline: 08/21/23: HEP given to pt 10/02/23: HEP compliant Goal status: MET   LONG TERM GOALS: Target date: 11/13/23  Pt will improve FOTO to target score to demonstrate clinically significant improvement in functional mobility.  Baseline: 08/21/23: 52/68 10/02/23: 57/68 Goal status: ONGOING  2.  Pt will improve 5xSTS to </= 14.8 seconds to display improvements in LE strength and equal age- matched norms.  Baseline: 08/21/23: Deferred to next session 09/24/23: 8.45 seconds Goal status: MET  3.  Pt will report <6/10 pain in R knee w/ prolonged ambulation to display clinically significant pain improvement. Baseline:08/21/23: 8/10, 10, 10/02/23: 2/10 Goal status: MET  4. Pt will improve R knee flexion AROM to 120 deg and R knee extension to 0 deg to be equal to L knee AROM for improved functional mobility.   Baseline: 08/21/23: Active ROM Right eval Left eval Right 10/30  Knee flexion 100 120 124  Knee extension 4 0 0   (Blank rows = not tested)  Goal status: MET   5. Pt will improve RLE 3 rep max to be >/+ to the LLE to demonstrate functional strength improvements of the RLE.  Baseline: 09/06/23: RLE- 20# LLE- 35#  Goal status: INITIAL    PLAN:  PT FREQUENCY: 2x/week  PT DURATION: 6 weeks  PLANNED INTERVENTIONS: Therapeutic exercises, Therapeutic activity, Neuromuscular re-education, Balance  training, Gait training, Patient/Family education, Self Care, Joint mobilization, Joint manipulation, Stair training, Spinal manipulation, Spinal mobilization, Cryotherapy, Moist heat, Manual therapy, and Re-evaluation  PLAN FOR NEXT SESSION: Progress note, Continue to progress bilat LE strengthening (RLE> LLE)   Lovie Macadamia, SPT   Delphia Grates. Fairly IV, PT, DPT Physical Therapist- [place]  [facility]  10/04/2023, 12:29 PM

## 2023-10-09 ENCOUNTER — Ambulatory Visit: Payer: PPO

## 2023-10-09 DIAGNOSIS — M25561 Pain in right knee: Secondary | ICD-10-CM | POA: Diagnosis not present

## 2023-10-09 DIAGNOSIS — M6281 Muscle weakness (generalized): Secondary | ICD-10-CM

## 2023-10-09 NOTE — Therapy (Addendum)
OUTPATIENT PHYSICAL THERAPY LOWER EXTREMITY TREATMENT/ PROGRESS NOTE Dates: 08/21/23 - 10/09/23    Patient Name: Gabrielle Webb MRN: [id number] DOB:[birth year], 82 y.o., female Today's Date: 10/09/2023  END OF SESSION:  PT End of Session - 10/09/23 0814     Visit Number 10    Number of Visits 13    Date for PT Re-Evaluation 11/13/23    PT Start Time 0815    PT Stop Time 0858    PT Time Calculation (min) 43 min    Activity Tolerance Patient tolerated treatment well    Behavior During Therapy [facility] for tasks assessed/performed             Past Medical History:  Diagnosis Date   Allergy    Anxiety    Breast cancer (HCC) 7/12   Right   Cellulitis    Diverticulosis of colon    GERD (gastroesophageal reflux disease)    Headache    sinus   History of hiatal hernia    Hx of radiation therapy 06/20/11 - 07/11/11   right breast   Hypothyroid    Osteoporosis    Personal history of radiation therapy 2012   Right Breast Cancer   PONV (postoperative nausea and vomiting)    Skin cancer    basal and squamous cell   Vertigo    Past Surgical History:  Procedure Laterality Date   ABDOMINAL HYSTERECTOMY N/A 11/08/2016   Procedure: HYSTERECTOMY TOTAL  ABDOMINAL;  Surgeon: Marcelle Overlie, MD;  Location: [facility];  Service: Gynecology;  Laterality: N/A;   APPENDECTOMY  07/2007   back sugery  1970   BREAST BIOPSY Left    Benign   BREAST EXCISIONAL BIOPSY Left 1983   BREAST EXCISIONAL BIOPSY Left 1983   BREAST LUMPECTOMY Right 05/16/2011   COLONOSCOPY     COLONOSCOPY WITH PROPOFOL N/A 08/27/2018   Procedure: COLONOSCOPY WITH PROPOFOL;  Surgeon: Scot Jun, MD;  Location: [facility];  Service: Endoscopy;  Laterality: N/A;   COLONOSCOPY WITH PROPOFOL N/A 08/16/2021   Procedure: COLONOSCOPY WITH PROPOFOL;  Surgeon: Earline Mayotte, MD;  Location: [facility] ENDOSCOPY;  Service: Endoscopy;  Laterality: N/A;   DIAGNOSTIC LAPAROSCOPY     DILATION AND CURETTAGE OF UTERUS N/A  05/31/2014   Procedure: DILATATION AND CURETTAGE WITH ULTRASOUND GUIDANCE;  Surgeon: Jeani Hawking, MD;  Location: [facility];  Service: Gynecology;  Laterality: N/A;   ESOPHAGOGASTRODUODENOSCOPY  04/20/2003   KNEE SURGERY  1962   MASTECTOMY     right eye surgery     SALPINGOOPHORECTOMY Bilateral 11/08/2016   Procedure: BILATERAL SALPINGO OOPHORECTOMY;  Surgeon: Marcelle Overlie, MD;  Location: [facility];  Service: Gynecology;  Laterality: Bilateral;   UPPER GI ENDOSCOPY     Patient Active Problem List   Diagnosis Date Noted   Brain fog 10/02/2023   Left inguinal pain 08/21/2023   LLQ pain 08/21/2023   Normocytic anemia 07/09/2023   Post covid-19 condition, unspecified 07/05/2023   Dark stools 07/05/2023   Hypokalemia 06/30/2023   COVID-19 virus infection 06/20/2023   Skin lesion 05/02/2023   Right knee pain 04/24/2023   Vaginal pain 01/18/2023   Grief reaction 07/04/2022   Encounter for screening mammogram for breast cancer 12/18/2021   Anxious mood as adjustment reaction 06/09/2021   Breast lump on right side at 8 o'clock position 05/03/2020   History of breast cancer 05/03/2020   Medicare annual wellness visit, subsequent 05/28/2018   Estrogen deficiency 05/28/2018   Fatigue 05/22/2017   Chest discomfort 05/22/2017  S/P TAH-BSO 11/08/2016   Routine general medical examination at a health care facility 05/06/2016   Malignant neoplasm of upper-outer quadrant of right breast in female, estrogen receptor positive (HCC) 03/21/2015   History of tamoxifen therapy 05/18/2014   Encounter for routine gynecological examination 05/06/2013   Colon cancer screening 05/06/2013   Encounter for Medicare annual wellness exam 04/28/2013   Hx of radiation therapy    Hx of Breast cancer, stage 1, Right, UOQ, Receptor+,Her2- 05/29/2011    Class: Stage 1   Other screening mammogram 03/21/2011   Post-menopausal 03/21/2011   Hypothyroidism 01/23/2008   Pure hypercholesterolemia 01/23/2008    Allergic rhinitis 01/23/2008   GERD 01/23/2008   Diverticulosis of colon 01/23/2008   IBS 01/23/2008   FIBROCYSTIC BREAST DISEASE 01/23/2008   Osteoporosis 10/17/2007    PCP: Judy Pimple MD  REFERRING PROVIDER: Marcelle Overlie, MD  REFERRING DIAG:  [phone] (ICD-10-CM) - Pain in right knee    THERAPY DIAG:  Right knee pain, unspecified chronicity  Muscle weakness (generalized)  Rationale for Evaluation and Treatment: Rehabilitation  ONSET DATE: April, 2024   SUBJECTIVE:   SUBJECTIVE STATEMENT: Pt reports no pain in the R knee at today's session. She reports mild soreness in the R knee today, following completing ADL's in the home.   PERTINENT HISTORY: Pt reports subacute R knee pain. This pain began following passive R knee flexion to end range during a pelvic PT visit, four months ago (April, 2024) and has continued to sustain the same level of pain since then. Pain reported at the anterior medial aspect of the R knee. Pain described as stabbing and sharp. Pt's pain with no activity is 0/10 NPS and 8-9/10 NPS with activity. Pt denies radicular symptoms in RLE, however does have LLE N/T. Pt has joint crepitus, popping and clicking in the knee joint. Pt's aggravating factors include prolonged walking, and stairs. The pain is relieved with a RLE knee sleeve, ice and OTC medications. Pt complaints of "pulling" when she flexes her knee and buckling of the knees 2/2 to weakness w/ ambulation.  PAIN:  Are you having pain? Yes: NPRS scale: 0/10 Pain location: Medial aspect of the anterior R knee Pain description: Sharp, stabbing pain  Aggravating factors: Prolonged walking, stairs  Relieving factors: Sleeve, OTC medications, ice  PRECAUTIONS: Fall  RED FLAGS: None   WEIGHT BEARING RESTRICTIONS: No  FALLS:  Has patient fallen in last 6 months? No  No stairs  OCCUPATION: Retired  PLOF: Independent  PATIENT GOALS: Want to walk without pain and reduce knee buckling.    NEXT MD VISIT: N/A for the knee   OBJECTIVE:   DIAGNOSTIC FINDINGS:  EXAM: RIGHT KNEE - COMPLETE 4+ VIEW   COMPARISON:  None Available.   FINDINGS: No acute fracture or dislocation. Small joint effusion. Moderate to severe lateral and mild medial compartment joint space narrowing. Patellofemoral joint space is relatively preserved. Small tricompartmental marginal osteophytes. Osteopenia. Chondrocalcinosis of the menisci. Soft tissues are unremarkable.   Single AP view of the left knee is unremarkable, other than chondrocalcinosis of the menisci.   IMPRESSION: 1. Tricompartmental osteoarthritis, moderate to severe in the lateral compartment.  PATIENT SURVEYS:  FOTO 52/68  COGNITION: Overall cognitive status: Within functional limits for tasks assessed     SENSATION: WFL   MUSCLE LENGTH: Hamstrings: Limited RLE/LLE <90 deg.    POSTURE:  Increased knee valgus of the R knee.   PALPATION: No TTP noted at the anterior or posterior med/ lat oint line  LOWER EXTREMITY ROM:  Active ROM Right eval Left eval  Knee flexion 100 120  Knee extension 4 0   (Blank rows = not tested)  LOWER EXTREMITY MMT:  MMT Right eval Left eval  Hip flexion 4- 4  Hip extension 2+ 2+  Hip abduction 4- 4-  Hip adduction 4- 4-  Hip internal rotation 4- 4  Hip external rotation 4 4  Knee flexion 4 4  Knee extension 4 4  Ankle dorsiflexion 4+ 4+  Ankle plantarflexion 4 4   (Blank rows = not tested)  LOWER EXTREMITY SPECIAL TESTS: (R) Knee special tests: Anterior drawer test: negative, Posterior drawer test: negative, and Valgus stress test: negative, Varus stress test: negative   FUNCTIONAL TESTS:  5 times sit to stand: Pt unable to perform 5x STS 2/2 R knee pain, defer to next session  5xSTS: 8.45 seconds 30 sec STS: 15  : 1600 ft 0/10NPS RLE   GAIT: Increased R knee valgus noted w/ ambulation.   JOINT MOBILITY: Mild R knee joint hypomobility felt in all planes.  Typical R patellar movements noted w/ mobilizations.    TODAY'S TREATMENT:                                                                                                                              DATE: 10/09/23  Nustep x5 minutes for LE strengthening and mobility lvl 4.0 Lateral banded walks with blue TB 5 x10' down and back 5' x6 down and back  Sled pushes w/ 10# 2 x50' down and back  Step ups onto 8" aerobic step w/ RLE and 4# DB's in bilat UE 's 2 x10 Step ups onto 8" aerobic step w/ RLE and 4# DB 2 x10  Leg press bilat LE's 30# 3 x8 Seated knee extension at OMEGA machine 3 x8 15# Seated knee flexion at OMEGA machine 3x8 20# Lateral lunges w/ 5# DB RLE/LLE 2 x10/ each side   PATIENT EDUCATION:  Education details: HEP given to pt, prognosis Person educated: Patient Education method: Medical illustrator Education comprehension: verbalized understanding and returned demonstration  HOME EXERCISE PROGRAM:  Access Code: [email or web address] URL: [email or web address] Date: 09/24/2023 Prepared by: Ronnie Derby  Exercises - Squat with Counter Support  - 1 x daily - 3-4 x weekly - 3 sets - 8 reps - Side Stepping with Resistance at Ankles  - 1 x daily - 3-4 x weekly - 3 sets - 5 reps - Side Lunge with Counter Support  - 1 x daily - 3-4 x weekly - 3 sets - 6 reps   ASSESSMENT:  CLINICAL IMPRESSION:  Session focused on bilateral LE strengthening and SLS activities. Pt's 10th visit today, requiring a progress note, however reference visit note from 10/02/23 as a re-certification and assessment of pt's goals was recently completed. Pt notes achieving her target FOTO score at today's session. Pt demonstrated improved activity tolerance to exercise progressions, including weighted sled pushes, with no significant  signs of discomfort. Pt continues to report mild lateral R knee pain during quick turning movements, but this pain subsides quickly with rest and does not limit  function. Overall, pt is making great functional gains, demonstrating increased strength, stability, and endurance. Continued focus on knee mobility and strengthening will help address current pain complaints and further progress functional capacity.Pt would continue to benefit from skilled PT interventions to address remaining RLE strength deficits to improve QoL and PLOF.   OBJECTIVE IMPAIRMENTS: decreased balance, decreased mobility, difficulty walking, decreased ROM, decreased strength, impaired flexibility, and pain.   ACTIVITY LIMITATIONS: carrying, lifting, squatting, and locomotion level  PARTICIPATION LIMITATIONS: community activity  PERSONAL FACTORS: Age, Past/current experiences, Time since onset of injury/illness/exacerbation, and 1 comorbidity: Osteoporosis  are also affecting patient's functional outcome.   REHAB POTENTIAL: Good  CLINICAL DECISION MAKING: Stable/uncomplicated  EVALUATION COMPLEXITY: Low   GOALS: Goals reviewed with patient? Yes  SHORT TERM GOALS: Target date: 09/17/23 Pt will be independent with HEP to improve R knee strength and decrease pain with functional activities  Baseline: 08/21/23: HEP given to pt 10/02/23: HEP compliant Goal status: MET   LONG TERM GOALS: Target date: 11/13/23  Pt will improve FOTO to target score to demonstrate clinically significant improvement in functional mobility.  Baseline: 08/21/23: 52/68 10/02/23: 57/68 10/09/23: 72/68 Goal status: MET  2.  Pt will improve 5xSTS to </= 14.8 seconds to display improvements in LE strength and equal age- matched norms.  Baseline: 08/21/23: Deferred to next session 09/24/23: 8.45 seconds Goal status: MET  3.  Pt will report <6/10 pain in R knee w/ prolonged ambulation to display clinically significant pain improvement. Baseline:08/21/23: 8/10, 10, 10/02/23: 2/10 Goal status: MET  4. Pt will improve R knee flexion AROM to 120 deg and R knee extension to 0 deg to be equal to L knee AROM  for improved functional mobility.   Baseline: 08/21/23: Active ROM Right eval Left eval Right 10/30  Knee flexion 100 120 124  Knee extension 4 0 0   (Blank rows = not tested)  Goal status: MET   5. Pt will improve RLE 3 rep max to be >/+ to the LLE to demonstrate functional strength improvements of the RLE.  Baseline: 09/06/23: RLE- 20# LLE- 35#  Goal status: INITIAL    PLAN:  PT FREQUENCY: 2x/week  PT DURATION: 6 weeks  PLANNED INTERVENTIONS: Therapeutic exercises, Therapeutic activity, Neuromuscular re-education, Balance training, Gait training, Patient/Family education, Self Care, Joint mobilization, Joint manipulation, Stair training, Spinal manipulation, Spinal mobilization, Cryotherapy, Moist heat, Manual therapy, and Re-evaluation  PLAN FOR NEXT SESSION: Continue to progress bilat LE strengthening (RLE> LLE)   Lovie Macadamia, SPT   Delphia Grates. Fairly IV, PT, DPT Physical Therapist- [facility]  10/09/2023, 1:09 PM

## 2023-10-11 ENCOUNTER — Ambulatory Visit: Payer: PPO

## 2023-10-11 ENCOUNTER — Telehealth: Payer: Self-pay | Admitting: Family Medicine

## 2023-10-11 NOTE — Telephone Encounter (Signed)
Patient called in and wanted to know what vaccines she need between now and the end of the year. She stated that they can be messaged to her. Thank you!

## 2023-10-11 NOTE — Telephone Encounter (Signed)
I recommend a flu shot and a covid shot every fall if she has not done that already   I recommend RSV for high risk people after 60 (folks with lung problems)- she does not fit that profile but it is entirely optional   We can give the flu shot here but not the others

## 2023-10-11 NOTE — Telephone Encounter (Signed)
Pt notified of Dr. Marliss Coots comments

## 2023-10-16 ENCOUNTER — Ambulatory Visit: Payer: PPO

## 2023-10-16 DIAGNOSIS — M25561 Pain in right knee: Secondary | ICD-10-CM | POA: Diagnosis not present

## 2023-10-16 DIAGNOSIS — M6281 Muscle weakness (generalized): Secondary | ICD-10-CM

## 2023-10-16 NOTE — Therapy (Signed)
OUTPATIENT PHYSICAL THERAPY LOWER EXTREMITY TREATMENT   Patient Name: Gabrielle Webb MRN: [id number] DOB:02/23/1941, 82 y.o., female Today's Date: 10/16/2023  END OF SESSION:  PT End of Session - 10/16/23 0809     Visit Number 11    Number of Visits 13    Date for PT Re-Evaluation 11/13/23    PT Start Time 0815    PT Stop Time 0859    PT Time Calculation (min) 44 min    Activity Tolerance Patient tolerated treatment well    Behavior During Therapy [facility] for tasks assessed/performed             Past Medical History:  Diagnosis Date   Allergy    Anxiety    Breast cancer (HCC) 7/12   Right   Cellulitis    Diverticulosis of colon    GERD (gastroesophageal reflux disease)    Headache    sinus   History of hiatal hernia    Hx of radiation therapy 06/20/11 - 07/11/11   right breast   Hypothyroid    Osteoporosis    Personal history of radiation therapy 2012   Right Breast Cancer   PONV (postoperative nausea and vomiting)    Skin cancer    basal and squamous cell   Vertigo    Past Surgical History:  Procedure Laterality Date   ABDOMINAL HYSTERECTOMY N/A 11/08/2016   Procedure: HYSTERECTOMY TOTAL  ABDOMINAL;  Surgeon: Marcelle Overlie, MD;  Location: [facility];  Service: Gynecology;  Laterality: N/A;   APPENDECTOMY  07/2007   back sugery  1970   BREAST BIOPSY Left    Benign   BREAST EXCISIONAL BIOPSY Left 1983   BREAST EXCISIONAL BIOPSY Left 1983   BREAST LUMPECTOMY Right 05/16/2011   COLONOSCOPY     COLONOSCOPY WITH PROPOFOL N/A 08/27/2018   Procedure: COLONOSCOPY WITH PROPOFOL;  Surgeon: Scot Jun, MD;  Location: [facility] ENDOSCOPY;  Service: Endoscopy;  Laterality: N/A;   COLONOSCOPY WITH PROPOFOL N/A 08/16/2021   Procedure: COLONOSCOPY WITH PROPOFOL;  Surgeon: Earline Mayotte, MD;  Location: [facility] ENDOSCOPY;  Service: Endoscopy;  Laterality: N/A;   DIAGNOSTIC LAPAROSCOPY     DILATION AND CURETTAGE OF UTERUS N/A 05/31/2014   Procedure: DILATATION AND  CURETTAGE WITH ULTRASOUND GUIDANCE;  Surgeon: Jeani Hawking, MD;  Location: [facility];  Service: Gynecology;  Laterality: N/A;   ESOPHAGOGASTRODUODENOSCOPY  04/20/2003   KNEE SURGERY  1962   MASTECTOMY     right eye surgery     SALPINGOOPHORECTOMY Bilateral 11/08/2016   Procedure: BILATERAL SALPINGO OOPHORECTOMY;  Surgeon: Marcelle Overlie, MD;  Location: [facility];  Service: Gynecology;  Laterality: Bilateral;   UPPER GI ENDOSCOPY     Patient Active Problem List   Diagnosis Date Noted   Brain fog 10/02/2023   Left inguinal pain 08/21/2023   LLQ pain 08/21/2023   Normocytic anemia 07/09/2023   Post covid-19 condition, unspecified 07/05/2023   Dark stools 07/05/2023   Hypokalemia 06/30/2023   COVID-19 virus infection 06/20/2023   Skin lesion 05/02/2023   Right knee pain 04/24/2023   Vaginal pain 01/18/2023   Grief reaction 07/04/2022   Encounter for screening mammogram for breast cancer 12/18/2021   Anxious mood as adjustment reaction 06/09/2021   Breast lump on right side at 8 o'clock position 05/03/2020   History of breast cancer 05/03/2020   Medicare annual wellness visit, subsequent 05/28/2018   Estrogen deficiency 05/28/2018   Fatigue 05/22/2017   Chest discomfort 05/22/2017   S/P TAH-BSO 11/08/2016   Routine  general medical examination at a health care facility 05/06/2016   Malignant neoplasm of upper-outer quadrant of right breast in female, estrogen receptor positive (HCC) 03/21/2015   History of tamoxifen therapy 05/18/2014   Encounter for routine gynecological examination 05/06/2013   Colon cancer screening 05/06/2013   Encounter for Medicare annual wellness exam 04/28/2013   Hx of radiation therapy    Hx of Breast cancer, stage 1, Right, UOQ, Receptor+,Her2- 05/29/2011    Class: Stage 1   Other screening mammogram 03/21/2011   Post-menopausal 03/21/2011   Hypothyroidism 01/23/2008   Pure hypercholesterolemia 01/23/2008   Allergic rhinitis 01/23/2008   GERD  01/23/2008   Diverticulosis of colon 01/23/2008   IBS 01/23/2008   FIBROCYSTIC BREAST DISEASE 01/23/2008   Osteoporosis 10/17/2007    PCP: Judy Pimple MD  REFERRING PROVIDER: Marcelle Overlie, MD  REFERRING DIAG:  [phone] (ICD-10-CM) - Pain in right knee    THERAPY DIAG:  Right knee pain, unspecified chronicity  Muscle weakness (generalized)  Rationale for Evaluation and Treatment: Rehabilitation  ONSET DATE: April, 2024   SUBJECTIVE:   SUBJECTIVE STATEMENT: Pt reports mild stiffness due to the cold weather. Intermittently feeling bad from long haul covid symptoms. Still feeling weak and tired.   PERTINENT HISTORY: Pt reports subacute R knee pain. This pain began following passive R knee flexion to end range during a pelvic PT visit, four months ago (April, 2024) and has continued to sustain the same level of pain since then. Pain reported at the anterior medial aspect of the R knee. Pain described as stabbing and sharp. Pt's pain with no activity is 0/10 NPS and 8-9/10 NPS with activity. Pt denies radicular symptoms in RLE, however does have LLE N/T. Pt has joint crepitus, popping and clicking in the knee joint. Pt's aggravating factors include prolonged walking, and stairs. The pain is relieved with a RLE knee sleeve, ice and OTC medications. Pt complaints of "pulling" when she flexes her knee and buckling of the knees 2/2 to weakness w/ ambulation.  PAIN:  Are you having pain? Yes: NPRS scale: 0/10 Pain location: Medial aspect of the anterior R knee Pain description: Sharp, stabbing pain  Aggravating factors: Prolonged walking, stairs  Relieving factors: Sleeve, OTC medications, ice  PRECAUTIONS: Fall  RED FLAGS: None   WEIGHT BEARING RESTRICTIONS: No  FALLS:  Has patient fallen in last 6 months? No  No stairs  OCCUPATION: Retired  PLOF: Independent  PATIENT GOALS: Want to walk without pain and reduce knee buckling.   NEXT MD VISIT: N/A for the knee    OBJECTIVE:   DIAGNOSTIC FINDINGS:  EXAM: RIGHT KNEE - COMPLETE 4+ VIEW   COMPARISON:  None Available.   FINDINGS: No acute fracture or dislocation. Small joint effusion. Moderate to severe lateral and mild medial compartment joint space narrowing. Patellofemoral joint space is relatively preserved. Small tricompartmental marginal osteophytes. Osteopenia. Chondrocalcinosis of the menisci. Soft tissues are unremarkable.   Single AP view of the left knee is unremarkable, other than chondrocalcinosis of the menisci.   IMPRESSION: 1. Tricompartmental osteoarthritis, moderate to severe in the lateral compartment.  PATIENT SURVEYS:  FOTO 52/68  COGNITION: Overall cognitive status: Within functional limits for tasks assessed     SENSATION: WFL   MUSCLE LENGTH: Hamstrings: Limited RLE/LLE <90 deg.    POSTURE:  Increased knee valgus of the R knee.   PALPATION: No TTP noted at the anterior or posterior med/ lat oint line   LOWER EXTREMITY ROM:  Active ROM Right eval  Left eval  Knee flexion 100 120  Knee extension 4 0   (Blank rows = not tested)  LOWER EXTREMITY MMT:  MMT Right eval Left eval  Hip flexion 4- 4  Hip extension 2+ 2+  Hip abduction 4- 4-  Hip adduction 4- 4-  Hip internal rotation 4- 4  Hip external rotation 4 4  Knee flexion 4 4  Knee extension 4 4  Ankle dorsiflexion 4+ 4+  Ankle plantarflexion 4 4   (Blank rows = not tested)  LOWER EXTREMITY SPECIAL TESTS: (R) Knee special tests: Anterior drawer test: negative, Posterior drawer test: negative, and Valgus stress test: negative, Varus stress test: negative   FUNCTIONAL TESTS:  5 times sit to stand: Pt unable to perform 5x STS 2/2 R knee pain, defer to next session  5xSTS: 8.45 seconds 30 sec STS: 15  : 1600 ft 0/10NPS RLE   GAIT: Increased R knee valgus noted w/ ambulation.   JOINT MOBILITY: Mild R knee joint hypomobility felt in all planes. Typical R patellar movements noted w/  mobilizations.    TODAY'S TREATMENT:                                                                                                                              DATE: 10/16/23  Stationary bike x5 minutes for LE strengthening and mobility lvl 4.0 Single leg RDL's with SUE support: 2x8/side. PT demo and min multimodal cuing for form/technique. Excellent carryover.  Lateral banded walks with blue TB 5 x10' down and back  Leg press, SLE:   Right: 2x8, 25#  Left: 2x8, 45# Lateral step ups on 8" step: 2x12/side with BUE support. SBA.  BOSU ball alternating lunges: 2x8/LE    PATIENT EDUCATION:  Education details: HEP given to pt, prognosis Person educated: Patient Education method: Medical illustrator Education comprehension: verbalized understanding and returned demonstration  HOME EXERCISE PROGRAM:  Access Code: [email or web address] URL: [email or web address] Date: 09/24/2023 Prepared by: Ronnie Derby  Exercises - Squat with Counter Support  - 1 x daily - 3-4 x weekly - 3 sets - 8 reps - Side Stepping with Resistance at Ankles  - 1 x daily - 3-4 x weekly - 3 sets - 5 reps - Side Lunge with Counter Support  - 1 x daily - 3-4 x weekly - 3 sets - 6 reps   ASSESSMENT:  CLINICAL IMPRESSION: Continuing POC with focus on single limb loading and strengthening. Pt continues to display RLE weakness compared to LLE as evidenced by leg press and alternating lunges onto BOSU ball. Session slightly regressed due to mild R hip flexor pain pt reports from prior session but pt endorses this is improving. Future sessions to continue on progressing R limb strength equal to LLE. Pt would continue to benefit from skilled PT interventions to address remaining RLE strength deficits to improve QoL and PLOF.   OBJECTIVE IMPAIRMENTS: decreased balance, decreased mobility, difficulty walking, decreased ROM, decreased strength, impaired flexibility,  and pain.   ACTIVITY LIMITATIONS: carrying,  lifting, squatting, and locomotion level  PARTICIPATION LIMITATIONS: community activity  PERSONAL FACTORS: Age, Past/current experiences, Time since onset of injury/illness/exacerbation, and 1 comorbidity: Osteoporosis  are also affecting patient's functional outcome.   REHAB POTENTIAL: Good  CLINICAL DECISION MAKING: Stable/uncomplicated  EVALUATION COMPLEXITY: Low   GOALS: Goals reviewed with patient? Yes  SHORT TERM GOALS: Target date: 09/17/23 Pt will be independent with HEP to improve R knee strength and decrease pain with functional activities  Baseline: 08/21/23: HEP given to pt 10/02/23: HEP compliant Goal status: MET   LONG TERM GOALS: Target date: 11/13/23  Pt will improve FOTO to target score to demonstrate clinically significant improvement in functional mobility.  Baseline: 08/21/23: 52/68 10/02/23: 57/68 10/09/23: 72/68 Goal status: MET  2.  Pt will improve 5xSTS to </= 14.8 seconds to display improvements in LE strength and equal age- matched norms.  Baseline: 08/21/23: Deferred to next session 09/24/23: 8.45 seconds Goal status: MET  3.  Pt will report <6/10 pain in R knee w/ prolonged ambulation to display clinically significant pain improvement. Baseline:08/21/23: 8/10, 10, 10/02/23: 2/10 Goal status: MET  4. Pt will improve R knee flexion AROM to 120 deg and R knee extension to 0 deg to be equal to L knee AROM for improved functional mobility.   Baseline: 08/21/23: Active ROM Right eval Left eval Right 10/30  Knee flexion 100 120 124  Knee extension 4 0 0   (Blank rows = not tested)  Goal status: MET   5. Pt will improve RLE 3 rep max to be >/+ to the LLE to demonstrate functional strength improvements of the RLE.  Baseline: 09/06/23: RLE- 20# LLE- 35#  Goal status: INITIAL    PLAN:  PT FREQUENCY: 2x/week  PT DURATION: 6 weeks  PLANNED INTERVENTIONS: Therapeutic exercises, Therapeutic activity, Neuromuscular re-education, Balance training, Gait  training, Patient/Family education, Self Care, Joint mobilization, Joint manipulation, Stair training, Spinal manipulation, Spinal mobilization, Cryotherapy, Moist heat, Manual therapy, and Re-evaluation  PLAN FOR NEXT SESSION: Continue to progress single limb strengthening on LLE   Kyrollos Cordell M. Fairly IV, PT, DPT Physical Therapist- [place]  [facility]  10/16/2023, 10:04 AM

## 2023-10-18 ENCOUNTER — Ambulatory Visit: Payer: PPO

## 2023-10-18 DIAGNOSIS — M25561 Pain in right knee: Secondary | ICD-10-CM

## 2023-10-18 DIAGNOSIS — M6281 Muscle weakness (generalized): Secondary | ICD-10-CM

## 2023-10-18 NOTE — Therapy (Addendum)
OUTPATIENT PHYSICAL THERAPY LOWER EXTREMITY TREATMENT   Patient Name: Gabrielle Webb MRN: [id number] DOB:1941/10/21, 82 y.o., female Today's Date: 10/18/2023  END OF SESSION:  PT End of Session - 10/18/23 0757     Visit Number 12    Number of Visits 13    Date for PT Re-Evaluation 11/13/23    PT Start Time 0810    PT Stop Time 0854    PT Time Calculation (min) 44 min    Activity Tolerance Patient tolerated treatment well    Behavior During Therapy [facility] for tasks assessed/performed             Past Medical History:  Diagnosis Date   Allergy    Anxiety    Breast cancer (HCC) 7/12   Right   Cellulitis    Diverticulosis of colon    GERD (gastroesophageal reflux disease)    Headache    sinus   History of hiatal hernia    Hx of radiation therapy 06/20/11 - 07/11/11   right breast   Hypothyroid    Osteoporosis    Personal history of radiation therapy 2012   Right Breast Cancer   PONV (postoperative nausea and vomiting)    Skin cancer    basal and squamous cell   Vertigo    Past Surgical History:  Procedure Laterality Date   ABDOMINAL HYSTERECTOMY N/A 11/08/2016   Procedure: HYSTERECTOMY TOTAL  ABDOMINAL;  Surgeon: Marcelle Overlie, MD;  Location: [facility];  Service: Gynecology;  Laterality: N/A;   APPENDECTOMY  07/2007   back sugery  1970   BREAST BIOPSY Left    Benign   BREAST EXCISIONAL BIOPSY Left 1983   BREAST EXCISIONAL BIOPSY Left 1983   BREAST LUMPECTOMY Right 05/16/2011   COLONOSCOPY     COLONOSCOPY WITH PROPOFOL N/A 08/27/2018   Procedure: COLONOSCOPY WITH PROPOFOL;  Surgeon: Scot Jun, MD;  Location: [facility] ENDOSCOPY;  Service: Endoscopy;  Laterality: N/A;   COLONOSCOPY WITH PROPOFOL N/A 08/16/2021   Procedure: COLONOSCOPY WITH PROPOFOL;  Surgeon: Earline Mayotte, MD;  Location: [facility] ENDOSCOPY;  Service: Endoscopy;  Laterality: N/A;   DIAGNOSTIC LAPAROSCOPY     DILATION AND CURETTAGE OF UTERUS N/A 05/31/2014   Procedure: DILATATION AND  CURETTAGE WITH ULTRASOUND GUIDANCE;  Surgeon: Jeani Hawking, MD;  Location: [facility];  Service: Gynecology;  Laterality: N/A;   ESOPHAGOGASTRODUODENOSCOPY  04/20/2003   KNEE SURGERY  1962   MASTECTOMY     right eye surgery     SALPINGOOPHORECTOMY Bilateral 11/08/2016   Procedure: BILATERAL SALPINGO OOPHORECTOMY;  Surgeon: Marcelle Overlie, MD;  Location: [facility];  Service: Gynecology;  Laterality: Bilateral;   UPPER GI ENDOSCOPY     Patient Active Problem List   Diagnosis Date Noted   Brain fog 10/02/2023   Left inguinal pain 08/21/2023   LLQ pain 08/21/2023   Normocytic anemia 07/09/2023   Post covid-19 condition, unspecified 07/05/2023   Dark stools 07/05/2023   Hypokalemia 06/30/2023   COVID-19 virus infection 06/20/2023   Skin lesion 05/02/2023   Right knee pain 04/24/2023   Vaginal pain 01/18/2023   Grief reaction 07/04/2022   Encounter for screening mammogram for breast cancer 12/18/2021   Anxious mood as adjustment reaction 06/09/2021   Breast lump on right side at 8 o'clock position 05/03/2020   History of breast cancer 05/03/2020   Medicare annual wellness visit, subsequent 05/28/2018   Estrogen deficiency 05/28/2018   Fatigue 05/22/2017   Chest discomfort 05/22/2017   S/P TAH-BSO 11/08/2016   Routine  general medical examination at a health care facility 05/06/2016   Malignant neoplasm of upper-outer quadrant of right breast in female, estrogen receptor positive (HCC) 03/21/2015   History of tamoxifen therapy 05/18/2014   Encounter for routine gynecological examination 05/06/2013   Colon cancer screening 05/06/2013   Encounter for Medicare annual wellness exam 04/28/2013   Hx of radiation therapy    Hx of Breast cancer, stage 1, Right, UOQ, Receptor+,Her2- 05/29/2011    Class: Stage 1   Other screening mammogram 03/21/2011   Post-menopausal 03/21/2011   Hypothyroidism 01/23/2008   Pure hypercholesterolemia 01/23/2008   Allergic rhinitis 01/23/2008   GERD  01/23/2008   Diverticulosis of colon 01/23/2008   IBS 01/23/2008   FIBROCYSTIC BREAST DISEASE 01/23/2008   Osteoporosis 10/17/2007    PCP: Judy Pimple MD  REFERRING PROVIDER: Marcelle Overlie, MD  REFERRING DIAG:  [phone] (ICD-10-CM) - Pain in right knee    THERAPY DIAG:  Right knee pain, unspecified chronicity  Muscle weakness (generalized)  Rationale for Evaluation and Treatment: Rehabilitation  ONSET DATE: April, 2024   SUBJECTIVE:   SUBJECTIVE STATEMENT: Pt reports mild R knee pain with quick stops and turning movements. Pt tolerated last session well noting no significant increase in soreness.    PERTINENT HISTORY: Pt reports subacute R knee pain. This pain began following passive R knee flexion to end range during a pelvic PT visit, four months ago (April, 2024) and has continued to sustain the same level of pain since then. Pain reported at the anterior medial aspect of the R knee. Pain described as stabbing and sharp. Pt's pain with no activity is 0/10 NPS and 8-9/10 NPS with activity. Pt denies radicular symptoms in RLE, however does have LLE N/T. Pt has joint crepitus, popping and clicking in the knee joint. Pt's aggravating factors include prolonged walking, and stairs. The pain is relieved with a RLE knee sleeve, ice and OTC medications. Pt complaints of "pulling" when she flexes her knee and buckling of the knees 2/2 to weakness w/ ambulation.  PAIN:  Are you having pain? Yes: NPRS scale: 0/10 Pain location: Medial aspect of the anterior R knee Pain description: Sharp, stabbing pain  Aggravating factors: Prolonged walking, stairs  Relieving factors: Sleeve, OTC medications, ice  PRECAUTIONS: Fall  RED FLAGS: None   WEIGHT BEARING RESTRICTIONS: No  FALLS:  Has patient fallen in last 6 months? No  No stairs  OCCUPATION: Retired  PLOF: Independent  PATIENT GOALS: Want to walk without pain and reduce knee buckling.   NEXT MD VISIT: N/A for the knee    OBJECTIVE:   DIAGNOSTIC FINDINGS:  EXAM: RIGHT KNEE - COMPLETE 4+ VIEW   COMPARISON:  None Available.   FINDINGS: No acute fracture or dislocation. Small joint effusion. Moderate to severe lateral and mild medial compartment joint space narrowing. Patellofemoral joint space is relatively preserved. Small tricompartmental marginal osteophytes. Osteopenia. Chondrocalcinosis of the menisci. Soft tissues are unremarkable.   Single AP view of the left knee is unremarkable, other than chondrocalcinosis of the menisci.   IMPRESSION: 1. Tricompartmental osteoarthritis, moderate to severe in the lateral compartment.  PATIENT SURVEYS:  FOTO 52/68  COGNITION: Overall cognitive status: Within functional limits for tasks assessed     SENSATION: WFL   MUSCLE LENGTH: Hamstrings: Limited RLE/LLE <90 deg.    POSTURE:  Increased knee valgus of the R knee.   PALPATION: No TTP noted at the anterior or posterior med/ lat oint line   LOWER EXTREMITY ROM:  Active ROM  Right eval Left eval  Knee flexion 100 120  Knee extension 4 0   (Blank rows = not tested)  LOWER EXTREMITY MMT:  MMT Right eval Left eval  Hip flexion 4- 4  Hip extension 2+ 2+  Hip abduction 4- 4-  Hip adduction 4- 4-  Hip internal rotation 4- 4  Hip external rotation 4 4  Knee flexion 4 4  Knee extension 4 4  Ankle dorsiflexion 4+ 4+  Ankle plantarflexion 4 4   (Blank rows = not tested)   FUNCTIONAL TESTS:  5xSTS: 8.45 seconds 30 sec STS: 15  : 1600 ft 0/10NPS RLE   GAIT: Increased R knee valgus noted w/ ambulation.   JOINT MOBILITY: Mild R knee joint hypomobility felt in all planes. Typical R patellar movements noted w/ mobilizations.    TODAY'S TREATMENT:                                                                                                                              DATE: 10/18/23 Nustep x5 minutes for LE strengthening and mobility lvl 3.0 Lateral banded walks with blue  TB 3 x10' down and back  BOSU ball alternating lunges RLE/LLE: 2x10/ each side Sled pushes w/ 20# 3 x50' down and back  Forward step ups onto 8" aerobic step w/ RLE in bilat UE 's 3 x10 w/ 1 UE support Lateral step ups onto 8" aerobic step w/ RLE in bilat UE 's 3 x10 w/ 1 UE support Leg press, SLE:   Right: 2x8, 20#  Left: 2x8, 35# Biofreeze application to lateral aspect of R knee to aid in pain reduction and decrease inflammation.   PATIENT EDUCATION:  Education details: HEP given to pt, prognosis Person educated: Patient Education method: Medical illustrator Education comprehension: verbalized understanding and returned demonstration  HOME EXERCISE PROGRAM:  Access Code: [email or web address] URL: [email or web address] Date: 09/24/2023 Prepared by: Ronnie Derby  Exercises - Squat with Counter Support  - 1 x daily - 3-4 x weekly - 3 sets - 8 reps - Side Stepping with Resistance at Ankles  - 1 x daily - 3-4 x weekly - 3 sets - 5 reps - Side Lunge with Counter Support  - 1 x daily - 3-4 x weekly - 3 sets - 6 reps   ASSESSMENT:  CLINICAL IMPRESSION: Session focused on bilat LE strengthening and SLS exercises. Pt continues to demonstrate improvements in bilateral LE strength, as evidenced by enhanced activity tolerance during today's session. However, pt reports a mild increase in irritability at the lateral aspect of the R knee, which was addressed with Biofreeze application to reduce pain and mild inflammation. Pt continues to note strength deficits in the RLE compared to the LLE, which are impacting overall function. Given these findings, pt would benefit from continued SLS strengthening activities to further improve balance, stability, and overall LE strength to improve QoL and return to PLOF.   OBJECTIVE IMPAIRMENTS: decreased balance, decreased mobility, difficulty  walking, decreased ROM, decreased strength, impaired flexibility, and pain.   ACTIVITY LIMITATIONS:  carrying, lifting, squatting, and locomotion level  PARTICIPATION LIMITATIONS: community activity  PERSONAL FACTORS: Age, Past/current experiences, Time since onset of injury/illness/exacerbation, and 1 comorbidity: Osteoporosis  are also affecting patient's functional outcome.   REHAB POTENTIAL: Good  CLINICAL DECISION MAKING: Stable/uncomplicated  EVALUATION COMPLEXITY: Low   GOALS: Goals reviewed with patient? Yes  SHORT TERM GOALS: Target date: 09/17/23 Pt will be independent with HEP to improve R knee strength and decrease pain with functional activities  Baseline: 08/21/23: HEP given to pt 10/02/23: HEP compliant Goal status: MET   LONG TERM GOALS: Target date: 11/13/23  Pt will improve FOTO to target score to demonstrate clinically significant improvement in functional mobility.  Baseline: 08/21/23: 52/68 10/02/23: 57/68 10/09/23: 72/68 Goal status: MET  2.  Pt will improve 5xSTS to </= 14.8 seconds to display improvements in LE strength and equal age- matched norms.  Baseline: 08/21/23: Deferred to next session 09/24/23: 8.45 seconds Goal status: MET  3.  Pt will report <6/10 pain in R knee w/ prolonged ambulation to display clinically significant pain improvement. Baseline:08/21/23: 8/10, 10, 10/02/23: 2/10 Goal status: MET  4. Pt will improve R knee flexion AROM to 120 deg and R knee extension to 0 deg to be equal to L knee AROM for improved functional mobility.   Baseline: 08/21/23: Active ROM Right eval Left eval Right 10/30  Knee flexion 100 120 124  Knee extension 4 0 0   (Blank rows = not tested)  Goal status: MET   5. Pt will improve RLE 3 rep max to be >/+ to the LLE to demonstrate functional strength improvements of the RLE.  Baseline: 09/06/23: RLE- 20# LLE- 35#  Goal status: INITIAL    PLAN:  PT FREQUENCY: 2x/week  PT DURATION: 6 weeks  PLANNED INTERVENTIONS: Therapeutic exercises, Therapeutic activity, Neuromuscular re-education, Balance  training, Gait training, Patient/Family education, Self Care, Joint mobilization, Joint manipulation, Stair training, Spinal manipulation, Spinal mobilization, Cryotherapy, Moist heat, Manual therapy, and Re-evaluation  PLAN FOR NEXT SESSION: Address visit number? Continue to progress single limb strengthening on LLE  Lovie Macadamia, SPT   10/18/2023, 10:07 AM

## 2023-10-23 ENCOUNTER — Telehealth: Payer: Self-pay | Admitting: Family Medicine

## 2023-10-23 NOTE — Telephone Encounter (Signed)
Patient is requesting a phone call to discuss if Dr Milinda Antis thinks that going to a health bar in  for her long covid that she is dealing with would be beneficial to her.She said that she has a friend that goes there and he helps with people suffering from long covid and brian freeze.She said that she just stays tired all the time.

## 2023-10-23 NOTE — Telephone Encounter (Signed)
No clue what that is or what they offer (? FDA approved therapies) -I am curious  Have her get info and bring to a visit and we can review   So sorry she is not feeling better

## 2023-10-23 NOTE — Telephone Encounter (Signed)
See OV on 10/02/23 pt discussed with PCP some and why she wanted to go there. It's an IV hydration place that given you IV fluids/vitamins depending on what you are dealing with.   Right from website for post covid:   POST-ILLNESS PROTOCOL      Two infusion visits (one week apart) to help combat the after-effects of Illness including fatigue, brain fog, and decreased energy levels.  Week 1 Infusion:  High Dose Vitamin C (20 grams) with Tri-Immune Boost and Zofran    Week 2 Infusion:  High Dose Vitamin C (20 grams) with Izola Price' Cocktail  **she can also add additional vitamins at an extra cost**

## 2023-10-23 NOTE — Telephone Encounter (Signed)
No FDA approval for those type of therapies so can't say whether they will help (results are likely individual)  There is some evidence (not proof)  that vitamin C may help some people but we don't know why  ( I don't know of any harms beyond diarrhea at high doses) Fluids are good if you are dehydrated Zofran is an anti nausea medicine -it does sedate   I think it is most likely safe to try but be skeptical about what they are giving you (? What tri immune boost or myers's cocktail is)

## 2023-10-24 ENCOUNTER — Ambulatory Visit: Payer: PPO

## 2023-10-24 DIAGNOSIS — M25561 Pain in right knee: Secondary | ICD-10-CM

## 2023-10-24 DIAGNOSIS — M6281 Muscle weakness (generalized): Secondary | ICD-10-CM

## 2023-10-24 NOTE — Telephone Encounter (Signed)
That sounds great,thanks  

## 2023-10-24 NOTE — Therapy (Signed)
OUTPATIENT PHYSICAL THERAPY LOWER EXTREMITY TREATMENT   Patient Name: Gabrielle Webb MRN: [id number] DOB:1941/11/09, 82 y.o., female Today's Date: 10/24/2023  END OF SESSION:  PT End of Session - 10/24/23 0813     Visit Number 13    Number of Visits 17    Date for PT Re-Evaluation 11/13/23    PT Start Time 0814    PT Stop Time 0858    PT Time Calculation (min) 44 min    Activity Tolerance Patient tolerated treatment well    Behavior During Therapy [facility] for tasks assessed/performed             Past Medical History:  Diagnosis Date   Allergy    Anxiety    Breast cancer (HCC) 7/12   Right   Cellulitis    Diverticulosis of colon    GERD (gastroesophageal reflux disease)    Headache    sinus   History of hiatal hernia    Hx of radiation therapy 06/20/11 - 07/11/11   right breast   Hypothyroid    Osteoporosis    Personal history of radiation therapy 2012   Right Breast Cancer   PONV (postoperative nausea and vomiting)    Skin cancer    basal and squamous cell   Vertigo    Past Surgical History:  Procedure Laterality Date   ABDOMINAL HYSTERECTOMY N/A 11/08/2016   Procedure: HYSTERECTOMY TOTAL  ABDOMINAL;  Surgeon: Marcelle Overlie, MD;  Location: [facility];  Service: Gynecology;  Laterality: N/A;   APPENDECTOMY  07/2007   back sugery  1970   BREAST BIOPSY Left    Benign   BREAST EXCISIONAL BIOPSY Left 1983   BREAST EXCISIONAL BIOPSY Left 1983   BREAST LUMPECTOMY Right 05/16/2011   COLONOSCOPY     COLONOSCOPY WITH PROPOFOL N/A 08/27/2018   Procedure: COLONOSCOPY WITH PROPOFOL;  Surgeon: Scot Jun, MD;  Location: [facility] ENDOSCOPY;  Service: Endoscopy;  Laterality: N/A;   COLONOSCOPY WITH PROPOFOL N/A 08/16/2021   Procedure: COLONOSCOPY WITH PROPOFOL;  Surgeon: Earline Mayotte, MD;  Location: [facility] ENDOSCOPY;  Service: Endoscopy;  Laterality: N/A;   DIAGNOSTIC LAPAROSCOPY     DILATION AND CURETTAGE OF UTERUS N/A 05/31/2014   Procedure: DILATATION AND  CURETTAGE WITH ULTRASOUND GUIDANCE;  Surgeon: Jeani Hawking, MD;  Location: [facility];  Service: Gynecology;  Laterality: N/A;   ESOPHAGOGASTRODUODENOSCOPY  04/20/2003   KNEE SURGERY  1962   MASTECTOMY     right eye surgery     SALPINGOOPHORECTOMY Bilateral 11/08/2016   Procedure: BILATERAL SALPINGO OOPHORECTOMY;  Surgeon: Marcelle Overlie, MD;  Location: [facility];  Service: Gynecology;  Laterality: Bilateral;   UPPER GI ENDOSCOPY     Patient Active Problem List   Diagnosis Date Noted   Brain fog 10/02/2023   Left inguinal pain 08/21/2023   LLQ pain 08/21/2023   Normocytic anemia 07/09/2023   Post covid-19 condition, unspecified 07/05/2023   Dark stools 07/05/2023   Hypokalemia 06/30/2023   COVID-19 virus infection 06/20/2023   Skin lesion 05/02/2023   Right knee pain 04/24/2023   Vaginal pain 01/18/2023   Grief reaction 07/04/2022   Encounter for screening mammogram for breast cancer 12/18/2021   Anxious mood as adjustment reaction 06/09/2021   Breast lump on right side at 8 o'clock position 05/03/2020   History of breast cancer 05/03/2020   Medicare annual wellness visit, subsequent 05/28/2018   Estrogen deficiency 05/28/2018   Fatigue 05/22/2017   Chest discomfort 05/22/2017   S/P TAH-BSO 11/08/2016   Routine  general medical examination at a health care facility 05/06/2016   Malignant neoplasm of upper-outer quadrant of right breast in female, estrogen receptor positive (HCC) 03/21/2015   History of tamoxifen therapy 05/18/2014   Encounter for routine gynecological examination 05/06/2013   Colon cancer screening 05/06/2013   Encounter for Medicare annual wellness exam 04/28/2013   Hx of radiation therapy    Hx of Breast cancer, stage 1, Right, UOQ, Receptor+,Her2- 05/29/2011    Class: Stage 1   Other screening mammogram 03/21/2011   Post-menopausal 03/21/2011   Hypothyroidism 01/23/2008   Pure hypercholesterolemia 01/23/2008   Allergic rhinitis 01/23/2008   GERD  01/23/2008   Diverticulosis of colon 01/23/2008   IBS 01/23/2008   FIBROCYSTIC BREAST DISEASE 01/23/2008   Osteoporosis 10/17/2007    PCP: Judy Pimple MD  REFERRING PROVIDER: Marcelle Overlie, MD  REFERRING DIAG:  [id number] (ICD-10-CM) - Pain in right knee    THERAPY DIAG:  Right knee pain, unspecified chronicity  Muscle weakness (generalized)  Rationale for Evaluation and Treatment: Rehabilitation  ONSET DATE: April, 2024   SUBJECTIVE:   SUBJECTIVE STATEMENT: Pt reports mild R knee discomfort. Tolerated last session well.  PERTINENT HISTORY: Pt reports subacute R knee pain. This pain began following passive R knee flexion to end range during a pelvic PT visit, four months ago (April, 2024) and has continued to sustain the same level of pain since then. Pain reported at the anterior medial aspect of the R knee. Pain described as stabbing and sharp. Pt's pain with no activity is 0/10 NPS and 8-9/10 NPS with activity. Pt denies radicular symptoms in RLE, however does have LLE N/T. Pt has joint crepitus, popping and clicking in the knee joint. Pt's aggravating factors include prolonged walking, and stairs. The pain is relieved with a RLE knee sleeve, ice and OTC medications. Pt complaints of "pulling" when she flexes her knee and buckling of the knees 2/2 to weakness w/ ambulation.  PAIN:  Are you having pain? Yes: NPRS scale: 0/10 Pain location: Medial aspect of the anterior R knee Pain description: Sharp, stabbing pain  Aggravating factors: Prolonged walking, stairs  Relieving factors: Sleeve, OTC medications, ice  PRECAUTIONS: Fall  RED FLAGS: None   WEIGHT BEARING RESTRICTIONS: No  FALLS:  Has patient fallen in last 6 months? No  No stairs  OCCUPATION: Retired  PLOF: Independent  PATIENT GOALS: Want to walk without pain and reduce knee buckling.   NEXT MD VISIT: N/A for the knee   OBJECTIVE:   DIAGNOSTIC FINDINGS:  EXAM: RIGHT KNEE - COMPLETE 4+ VIEW    COMPARISON:  None Available.   FINDINGS: No acute fracture or dislocation. Small joint effusion. Moderate to severe lateral and mild medial compartment joint space narrowing. Patellofemoral joint space is relatively preserved. Small tricompartmental marginal osteophytes. Osteopenia. Chondrocalcinosis of the menisci. Soft tissues are unremarkable.   Single AP view of the left knee is unremarkable, other than chondrocalcinosis of the menisci.   IMPRESSION: 1. Tricompartmental osteoarthritis, moderate to severe in the lateral compartment.  PATIENT SURVEYS:  FOTO 52/68  COGNITION: Overall cognitive status: Within functional limits for tasks assessed     SENSATION: WFL   MUSCLE LENGTH: Hamstrings: Limited RLE/LLE <90 deg.    POSTURE:  Increased knee valgus of the R knee.   PALPATION: No TTP noted at the anterior or posterior med/ lat oint line   LOWER EXTREMITY ROM:  Active ROM Right eval Left eval  Knee flexion 100 120  Knee extension 4 0   (  Blank rows = not tested)  LOWER EXTREMITY MMT:  MMT Right eval Left eval  Hip flexion 4- 4  Hip extension 2+ 2+  Hip abduction 4- 4-  Hip adduction 4- 4-  Hip internal rotation 4- 4  Hip external rotation 4 4  Knee flexion 4 4  Knee extension 4 4  Ankle dorsiflexion 4+ 4+  Ankle plantarflexion 4 4   (Blank rows = not tested)   FUNCTIONAL TESTS:  5xSTS: 8.45 seconds 30 sec STS: 15  : 1600 ft 0/10NPS RLE   GAIT: Increased R knee valgus noted w/ ambulation.   JOINT MOBILITY: Mild R knee joint hypomobility felt in all planes. Typical R patellar movements noted w/ mobilizations.    TODAY'S TREATMENT:                                                                                                                              DATE: 10/24/23  Nustep x5 minutes for LE strengthening and mobility lvl 5.0  Matrix hip exercises R/L:   Flexion: 2x12, 55#/ 55# 2x12  Abduction: 2x12, 40#/ 40# 2x12   Lateral lunges  with 2 KG med ball: 2x8/side  Sled pushes w/ 30# 3 x50' down and back   Single leg RDL with SUE support: 2x12/side  PATIENT EDUCATION:  Education details: HEP given to pt, prognosis Person educated: Patient Education method: Explanation and Demonstration Education comprehension: verbalized understanding and returned demonstration  HOME EXERCISE PROGRAM: Access Code: [email or web address] URL: [email or web address] Date: 09/24/2023 Prepared by: Ronnie Derby  Exercises - Squat with Counter Support  - 1 x daily - 3-4 x weekly - 3 sets - 8 reps - Side Stepping with Resistance at Ankles  - 1 x daily - 3-4 x weekly - 3 sets - 5 reps - Side Lunge with Counter Support  - 1 x daily - 3-4 x weekly - 3 sets - 6 reps   ASSESSMENT:  CLINICAL IMPRESSION: Progressed therex today via increased resistance and varying modes of single leg strengthening to provide alternative muscular recruitment to prevent strength plateau. Pt remains without pain with progressive loading and varying modes of exercises. Pt requires mild PT demo and VC's but demonstrates excellent carryover. Pt does continue to display some mild RLE weakness in proximal hips compared to LLE as evidenced with single leg tasks today thus will benefit from continued SLS strengthening activities to further improve balance, stability, and overall LE strength to improve QoL and return to PLOF.   OBJECTIVE IMPAIRMENTS: decreased balance, decreased mobility, difficulty walking, decreased ROM, decreased strength, impaired flexibility, and pain.   ACTIVITY LIMITATIONS: carrying, lifting, squatting, and locomotion level  PARTICIPATION LIMITATIONS: community activity  PERSONAL FACTORS: Age, Past/current experiences, Time since onset of injury/illness/exacerbation, and 1 comorbidity: Osteoporosis  are also affecting patient's functional outcome.   REHAB POTENTIAL: Good  CLINICAL DECISION MAKING: Stable/uncomplicated  EVALUATION COMPLEXITY:  Low   GOALS: Goals reviewed with patient? Yes  SHORT TERM GOALS: Target date:  09/17/23 Pt will be independent with HEP to improve R knee strength and decrease pain with functional activities  Baseline: 08/21/23: HEP given to pt 10/02/23: HEP compliant Goal status: MET   LONG TERM GOALS: Target date: 11/13/23  Pt will improve FOTO to target score to demonstrate clinically significant improvement in functional mobility.  Baseline: 08/21/23: 52/68 10/02/23: 57/68 10/09/23: 72/68 Goal status: MET  2.  Pt will improve 5xSTS to </= 14.8 seconds to display improvements in LE strength and equal age- matched norms.  Baseline: 08/21/23: Deferred to next session 09/24/23: 8.45 seconds Goal status: MET  3.  Pt will report <6/10 pain in R knee w/ prolonged ambulation to display clinically significant pain improvement. Baseline:08/21/23: 8/10, 10, 10/02/23: 2/10 Goal status: MET  4. Pt will improve R knee flexion AROM to 120 deg and R knee extension to 0 deg to be equal to L knee AROM for improved functional mobility.   Baseline: 08/21/23: Active ROM Right eval Left eval Right 10/30  Knee flexion 100 120 124  Knee extension 4 0 0   (Blank rows = not tested)  Goal status: MET   5. Pt will improve RLE 3 rep max to be >/+ to the LLE to demonstrate functional strength improvements of the RLE.  Baseline: 09/06/23: RLE- 20# LLE- 35#  Goal status: INITIAL    PLAN:  PT FREQUENCY: 2x/week  PT DURATION: 6 weeks  PLANNED INTERVENTIONS: Therapeutic exercises, Therapeutic activity, Neuromuscular re-education, Balance training, Gait training, Patient/Family education, Self Care, Joint mobilization, Joint manipulation, Stair training, Spinal manipulation, Spinal mobilization, Cryotherapy, Moist heat, Manual therapy, and Re-evaluation  PLAN FOR NEXT SESSION:  Continue to progress single limb strengthening on LLE   Sahiba Granholm M. Fairly IV, PT, DPT Physical Therapist- [place]  [facility] 10/24/2023, 10:05 AM

## 2023-10-24 NOTE — Telephone Encounter (Signed)
Pt notified of Dr. Royden Purl comments. Pt said the reason she asked again about the health bar is because Sunday night (it was dark) she was driving from church and bent down to pick something up and when she sat back up she had forgot where she was and couldn't figure out where she was. She drove around for a while until she recognized a street and was able to get home. Pt said she was really concerned about that and knows she is still having "brain frog" problems since covid. Pt may still call the [facility] and get an appt because multiple ppl have said it helps their long haul covid sxs but she also wanted to speak to you about it. Pt has her f/u scheduled on 11/19/23 which I kept on the books but also scheduled her an appt on Monday 10/28/23 (1st available with PCP) to discuss her memory problems per pt request.   FYI to PCP

## 2023-10-28 ENCOUNTER — Ambulatory Visit: Payer: PPO

## 2023-10-28 ENCOUNTER — Encounter: Payer: Self-pay | Admitting: Family Medicine

## 2023-10-28 ENCOUNTER — Ambulatory Visit: Payer: PPO | Admitting: Family Medicine

## 2023-10-28 VITALS — HR 69 | Temp 98.0°F | Resp 16 | Ht 63.0 in | Wt 132.5 lb

## 2023-10-28 DIAGNOSIS — R413 Other amnesia: Secondary | ICD-10-CM

## 2023-10-28 DIAGNOSIS — R410 Disorientation, unspecified: Secondary | ICD-10-CM | POA: Diagnosis not present

## 2023-10-28 DIAGNOSIS — E7800 Pure hypercholesterolemia, unspecified: Secondary | ICD-10-CM

## 2023-10-28 DIAGNOSIS — F4322 Adjustment disorder with anxiety: Secondary | ICD-10-CM | POA: Diagnosis not present

## 2023-10-28 DIAGNOSIS — M818 Other osteoporosis without current pathological fracture: Secondary | ICD-10-CM | POA: Diagnosis not present

## 2023-10-28 DIAGNOSIS — R5382 Chronic fatigue, unspecified: Secondary | ICD-10-CM | POA: Diagnosis not present

## 2023-10-28 DIAGNOSIS — F4320 Adjustment disorder, unspecified: Secondary | ICD-10-CM

## 2023-10-28 DIAGNOSIS — F4321 Adjustment disorder with depressed mood: Secondary | ICD-10-CM

## 2023-10-28 DIAGNOSIS — E039 Hypothyroidism, unspecified: Secondary | ICD-10-CM | POA: Diagnosis not present

## 2023-10-28 LAB — LIPID PANEL
Cholesterol: 203 mg/dL — ABNORMAL HIGH (ref 0–200)
HDL: 59.6 mg/dL (ref >39.00)
LDL Cholesterol: 118 mg/dL — ABNORMAL HIGH (ref 0–99)
NonHDL: 143.74
Total CHOL/HDL Ratio: 3
Triglycerides: 128 mg/dL (ref 0.0–149.0)
VLDL: 25.6 mg/dL (ref 0.0–40.0)

## 2023-10-28 LAB — BASIC METABOLIC PANEL
BUN: 13 mg/dL (ref 6–23)
CO2: 30 meq/L (ref 19–32)
Calcium: 9.3 mg/dL (ref 8.4–10.5)
Chloride: 103 meq/L (ref 96–112)
Creatinine, Ser: 0.75 mg/dL (ref 0.40–1.20)
GFR: 75.18 mL/min (ref >60.00)
Glucose, Bld: 81 mg/dL (ref 70–99)
Potassium: 4.2 meq/L (ref 3.5–5.1)
Sodium: 140 meq/L (ref 135–145)

## 2023-10-28 LAB — VITAMIN D 25 HYDROXY (VIT D DEFICIENCY, FRACTURES): VITD: 46.03 ng/mL (ref 30.00–100.00)

## 2023-10-28 LAB — FOLATE: Folate: 19.4 ng/mL (ref >5.9)

## 2023-10-28 LAB — TSH: TSH: 16.18 u[IU]/mL — ABNORMAL HIGH (ref 0.35–5.50)

## 2023-10-28 LAB — VITAMIN B12: Vitamin B-12: 1227 pg/mL — ABNORMAL HIGH (ref 211–911)

## 2023-10-28 MED ORDER — FLUOXETINE HCL 10 MG PO CAPS: 10.0000 mg | ORAL_CAPSULE | Freq: Every day | ORAL | 1 refills | Status: DC

## 2023-10-28 NOTE — Assessment & Plan Note (Signed)
TSH ordered  Taking levothyroxine 75 mcg daily  More tired since having covid Also some cognitive changes

## 2023-10-28 NOTE — Assessment & Plan Note (Signed)
Lab today  Disc goals for lipids and reasons to control them Rev last labs with pt Rev low sat fat diet in detail  Atorvastatin 10 mg daily

## 2023-10-28 NOTE — Assessment & Plan Note (Signed)
D level added to labs today

## 2023-10-28 NOTE — Assessment & Plan Note (Signed)
Pt had episode of confusion vs global amnesia  Happened in car after bending over, did not know where she was  No headache/dizziness  Got home fine but does not remember it   Also some cognitive issues/ brain fog since covid this summer  Labs drawn  Will consider imaging  Reassuring exam today

## 2023-10-28 NOTE — Progress Notes (Signed)
Subjective:    Patient ID: Gabrielle Webb, female    DOB: 09-13-1941, 82 y.o.   MRN: [id number]  HPI  Wt Readings from Last 3 Encounters:  10/28/23 132 lb 8 oz (60.1 kg)  10/02/23 134 lb 2 oz (60.8 kg)  08/21/23 132 lb (59.9 kg)   23.47 kg/m  Vitals:   10/28/23 0916  Pulse: 69  Resp: 16  Temp: 98 F (36.7 C)  SpO2: 98%    Pt presents to discuss brain fog and memory  Has had problems since having covid in July   Was thinking about going to a health bar (some friends use) for vitamin infusions   Last visit end to start lexapro 10 mg daily  to help sleep and fatigue - she took it one night and never took it again  Forgot to take it again Is sleeping ok  Was dealing with grief reaction last time (mother and close friend)     Had one episode of confusion  She bent over to pick up pocket book in her car  and sat back up and felt out of it -was at an intersection  Felt confused and did not know where she was   Got home but does not remember how      Lab Results  Component Value Date   TSH 2.23 06/27/2022     Lab Results  Component Value Date   NA 140 08/21/2023   K 4.0 08/21/2023   CO2 30 08/21/2023   GLUCOSE 90 08/21/2023   BUN 14 08/21/2023   CREATININE 0.75 08/21/2023   CALCIUM 9.6 08/21/2023   GFR 75.28 08/21/2023   EGFR 82 (L) 07/03/2016   GFRNONAA >60 06/25/2023   Lab Results  Component Value Date   WBC 8.6 08/21/2023   HGB 12.6 08/21/2023   HCT 38.3 08/21/2023   MCV 91.4 08/21/2023   PLT 179.0 08/21/2023   Lab Results  Component Value Date   TSH 2.23 06/27/2022   Lab Results  Component Value Date   ESRSEDRATE 16 08/21/2023   No results found for: "VITAMINB12" No results found for: "FOLATE"  Last vitamin D Lab Results  Component Value Date   VD25OH 37.34 06/27/2022   Has osteoporosis  Hyperlipidemia Takes atorvastatin 10 mg daily  Lab Results  Component Value Date   CHOL 174 06/27/2022   HDL 53.10 06/27/2022   LDLCALC  97 06/27/2022   LDLDIRECT 142.0 03/19/2011   TRIG 119.0 06/27/2022   CHOLHDL 3 06/27/2022         10/28/2023    9:26 AM 07/02/2023    9:01 AM 05/02/2023    8:27 AM 09/20/2022    8:49 AM 07/25/2022    2:06 PM  Depression screen PHQ 2/9  Decreased Interest 0 0 0 0 0  Down, Depressed, Hopeless 0 0 0 0 0  PHQ - 2 Score 0 0 0 0 0  Altered sleeping 0  0    Tired, decreased energy 3  0    Change in appetite 0  0    Feeling bad or failure about yourself  0  0    Trouble concentrating 0  0    Moving slowly or fidgety/restless 0  0    Suicidal thoughts 0  0    PHQ-9 Score 3  0    Difficult doing work/chores Not difficult at all  Not difficult at all        05/02/2023    8:27 AM 06/09/2021  10:18 AM  GAD 7 : Generalized Anxiety Score  Nervous, Anxious, on Edge 0 3  Control/stop worrying 0 3  Worry too much - different things 0 3  Trouble relaxing 0 3  Restless 0   Easily annoyed or irritable 0   Afraid - awful might happen 0   Total GAD 7 Score 0   Anxiety Difficulty Not difficult at all Not difficult at all       Patient Active Problem List   Diagnosis Date Noted   Episode of confusion 10/28/2023   Memory change 10/02/2023   Left inguinal pain 08/21/2023   Normocytic anemia 07/09/2023   Post covid-19 condition, unspecified 07/05/2023   Dark stools 07/05/2023   Hypokalemia 06/30/2023   Skin lesion 05/02/2023   Right knee pain 04/24/2023   Vaginal pain 01/18/2023   Grief reaction 07/04/2022   Encounter for screening mammogram for breast cancer 12/18/2021   Anxious mood as adjustment reaction 06/09/2021   History of breast cancer 05/03/2020   Medicare annual wellness visit, subsequent 05/28/2018   Estrogen deficiency 05/28/2018   Fatigue 05/22/2017   S/P TAH-BSO 11/08/2016   Routine general medical examination at a health care facility 05/06/2016   Malignant neoplasm of upper-outer quadrant of right breast in female, estrogen receptor positive (HCC) 03/21/2015    History of tamoxifen therapy 05/18/2014   Encounter for routine gynecological examination 05/06/2013   Colon cancer screening 05/06/2013   Encounter for Medicare annual wellness exam 04/28/2013   Hx of radiation therapy    Hx of Breast cancer, stage 1, Right, UOQ, Receptor+,Her2- 05/29/2011    Class: Stage 1   Other screening mammogram 03/21/2011   Post-menopausal 03/21/2011   Hypothyroidism 01/23/2008   Pure hypercholesterolemia 01/23/2008   Allergic rhinitis 01/23/2008   GERD 01/23/2008   Diverticulosis of colon 01/23/2008   IBS 01/23/2008   FIBROCYSTIC BREAST DISEASE 01/23/2008   Osteoporosis 10/17/2007   Past Medical History:  Diagnosis Date   Allergy    Anxiety    Breast cancer (HCC) 7/12   Right   Cellulitis    Diverticulosis of colon    GERD (gastroesophageal reflux disease)    Headache    sinus   History of hiatal hernia    Hx of radiation therapy 06/20/11 - 07/11/11   right breast   Hypothyroid    Osteoporosis    Personal history of radiation therapy 2012   Right Breast Cancer   PONV (postoperative nausea and vomiting)    Skin cancer    basal and squamous cell   Vertigo    Past Surgical History:  Procedure Laterality Date   ABDOMINAL HYSTERECTOMY N/A 11/08/2016   Procedure: HYSTERECTOMY TOTAL  ABDOMINAL;  Surgeon: Marcelle Overlie, MD;  Location: [facility];  Service: Gynecology;  Laterality: N/A;   APPENDECTOMY  07/2007   back sugery  1970   BREAST BIOPSY Left    Benign   BREAST EXCISIONAL BIOPSY Left 1983   BREAST EXCISIONAL BIOPSY Left 1983   BREAST LUMPECTOMY Right 05/16/2011   COLONOSCOPY     COLONOSCOPY WITH PROPOFOL N/A 08/27/2018   Procedure: COLONOSCOPY WITH PROPOFOL;  Surgeon: Scot Jun, MD;  Location: [facility] ENDOSCOPY;  Service: Endoscopy;  Laterality: N/A;   COLONOSCOPY WITH PROPOFOL N/A 08/16/2021   Procedure: COLONOSCOPY WITH PROPOFOL;  Surgeon: Earline Mayotte, MD;  Location: [facility] ENDOSCOPY;  Service: Endoscopy;  Laterality: N/A;    DIAGNOSTIC LAPAROSCOPY     DILATION AND CURETTAGE OF UTERUS N/A 05/31/2014   Procedure: DILATATION AND  CURETTAGE WITH ULTRASOUND GUIDANCE;  Surgeon: Jeani Hawking, MD;  Location: [facility];  Service: Gynecology;  Laterality: N/A;   ESOPHAGOGASTRODUODENOSCOPY  04/20/2003   KNEE SURGERY  1962   MASTECTOMY     right eye surgery     SALPINGOOPHORECTOMY Bilateral 11/08/2016   Procedure: BILATERAL SALPINGO OOPHORECTOMY;  Surgeon: Marcelle Overlie, MD;  Location: [facility];  Service: Gynecology;  Laterality: Bilateral;   UPPER GI ENDOSCOPY     Social History   Tobacco Use   Smoking status: Never   Smokeless tobacco: Never  Vaping Use   Vaping status: Never Used  Substance Use Topics   Alcohol use: No    Alcohol/week: 0.0 standard drinks of alcohol   Drug use: No   Family History  Problem Relation Age of Onset   Atrial fibrillation Mother    Coronary artery disease Mother    Congenital heart disease Mother    Heart attack Father 47   Hypertension Father    Diabetes Maternal Aunt    Cancer Maternal Aunt        leukemia   Diabetes Maternal Uncle    Cancer Maternal Uncle        colon   Diabetes Other        Grandmother   Coronary artery disease Other        Grandmother   Uterine cancer Other        Grandmother   Leukemia Other        Aunt   Allergies  Allergen Reactions   Codeine Nausea And Vomiting   Fentanyl Nausea And Vomiting   Hydromorphone Nausea And Vomiting        Morphine Nausea And Vomiting   Ciprofloxacin     Intolerant- tingling    Tape     Surgical tape   Prednisone Palpitations    Rapid hear beat   Current Outpatient Medications on File Prior to Visit  Medication Sig Dispense Refill   atorvastatin (LIPITOR) 10 MG tablet TAKE 1 TABLET BY MOUTH DAILY 90 tablet 0   BIOTIN PO Take by mouth daily.     CALCIUM PO Take 1,500 mg by mouth daily.     carboxymethylcellulose (REFRESH PLUS) 0.5 % SOLN 1 drop 3 (three) times daily as needed.     chlorthalidone  (HYGROTON) 25 MG tablet TAKE TWO TABLETS EVERY DAY AS NEEDED FORSWELLING 180 tablet 0   Cholecalciferol (VITAMIN D) 2000 UNITS CAPS Take 1 capsule by mouth daily.     Cyanocobalamin (B-12 PO) Take 2,500 mcg by mouth daily.     denosumab (PROLIA) 60 MG/ML SOSY injection Inject 60 mg into the skin every 6 (six) months. 1 mL 0   diclofenac sodium (VOLTAREN) 1 % GEL Apply 2 g topically 4 (four) times daily. Rub into affected area of foot 2 to 4 times daily (Patient taking differently: Apply 2 g topically as needed. Rub into affected area of foot 2 to 4 times daily) 100 g 2   dicyclomine (BENTYL) 10 MG capsule TAKE 1 CAPSULE BY MOUTH TWICE DAILY FOR SPASMS 180 capsule 2   famotidine (PEPCID) 20 MG tablet TAKE ONE TABLET EVERY DAY AS NEEDED FOR HEARTBURN OR INDIGESTION 90 tablet 3   Flaxseed, Linseed, (FLAX SEED OIL PO) Take by mouth. 2 a day     fluticasone (FLONASE) 50 MCG/ACT nasal spray SPRAY TWICE INTO EACH NOSTRIL ONCE DAILY 18 g 2   levothyroxine (SYNTHROID) 75 MCG tablet TAKE ONE TABLET BY MOUTH DAILY BEFORE BREAKFAST 90  tablet 3   Multiple Vitamin (MULTIVITAMIN) capsule Take 1 capsule by mouth daily.     Propylene Glycol (SYSTANE COMPLETE OP) Apply to eye.     No current facility-administered medications on file prior to visit.    Review of Systems  Constitutional:  Negative for activity change, appetite change, fatigue, fever and unexpected weight change.  HENT:  Negative for congestion, ear pain, rhinorrhea, sinus pressure and sore throat.   Eyes:  Negative for pain, redness and visual disturbance.  Respiratory:  Negative for cough, shortness of breath and wheezing.   Cardiovascular:  Negative for chest pain and palpitations.  Gastrointestinal:  Negative for abdominal pain, blood in stool, constipation and diarrhea.  Endocrine: Negative for polydipsia and polyuria.  Genitourinary:  Negative for dysuria, frequency and urgency.  Musculoskeletal:  Negative for arthralgias, back pain and  myalgias.  Skin:  Negative for pallor and rash.  Allergic/Immunologic: Negative for environmental allergies.  Neurological:  Negative for dizziness, syncope and headaches.  Hematological:  Negative for adenopathy. Does not bruise/bleed easily.  Psychiatric/Behavioral:  Positive for confusion, decreased concentration and dysphoric mood. Negative for sleep disturbance. The patient is nervous/anxious.        Objective:   Physical Exam Constitutional:      General: She is not in acute distress.    Appearance: Normal appearance. She is well-developed and normal weight. She is not ill-appearing or diaphoretic.  HENT:     Head: Normocephalic and atraumatic.  Eyes:     Conjunctiva/sclera: Conjunctivae normal.     Pupils: Pupils are equal, round, and reactive to light.  Neck:     Thyroid: No thyromegaly.     Vascular: No carotid bruit or JVD.  Cardiovascular:     Rate and Rhythm: Normal rate and regular rhythm.     Heart sounds: Normal heart sounds.     No gallop.  Pulmonary:     Effort: Pulmonary effort is normal. No respiratory distress.     Breath sounds: Normal breath sounds. No wheezing or rales.  Abdominal:     General: There is no distension or abdominal bruit.     Palpations: Abdomen is soft.  Musculoskeletal:     Cervical back: Normal range of motion and neck supple.     Right lower leg: No edema.     Left lower leg: No edema.  Lymphadenopathy:     Cervical: No cervical adenopathy.  Skin:    General: Skin is warm and dry.     Coloration: Skin is not pale.     Findings: No rash.  Neurological:     Mental Status: She is alert.     Cranial Nerves: No cranial nerve deficit.     Sensory: No sensory deficit.     Motor: No weakness.     Coordination: Romberg sign negative. Coordination normal. Finger-Nose-Finger Test normal.     Gait: Gait normal.     Deep Tendon Reflexes: Reflexes are normal and symmetric. Reflexes normal.  Psychiatric:        Attention and Perception:  Attention normal.        Mood and Affect: Mood is anxious.        Speech: Speech normal.        Behavior: Behavior normal.     Comments: Candidly discusses symptoms and stressors    MMSE 28 out of 30   Normal clock draw but hands are all done as one curved line  Assessment & Plan:   Problem List Items Addressed This Visit       Endocrine   Hypothyroidism    TSH ordered  Taking levothyroxine 75 mcg daily  More tired since having covid Also some cognitive changes       Relevant Orders   TSH     Musculoskeletal and Integument   Osteoporosis    D level added to labs today        Other   Pure hypercholesterolemia    Lab today  Disc goals for lipids and reasons to control them Rev last labs with pt Rev low sat fat diet in detail  Atorvastatin 10 mg daily       Relevant Orders   Lipid panel   Memory change - Primary    Some issues with brain fog/cognitive slow down after covid this summer  MMSE score today was 28/30-quite good  Clock draw was accurate but she drew both hands as one curved line Reports one episode of confusion vs amnesia as well   No doubt depresson/anx /grief may add to this   Reviewed last labs Folate and B12 levels today as well as tsh and lipiss Blood pressure has not been a problem   May consider imaging after results         Relevant Orders   Vitamin B12   Folate   Basic metabolic panel   Lipid panel   VITAMIN D 25 Hydroxy (Vit-D Deficiency, Fractures)   TSH   Grief reaction    See a/p for anxiety and depression       Fatigue    Ongoing since covid this summer  Now some cognitive changes /brain fog Considered going to health bar for vit infusion   Will check B12 and folate today before advising further   Depression with anxiety and grief may play a role       Relevant Orders   Vitamin B12   Folate   Basic metabolic panel   TSH   Episode of confusion    Pt had episode of confusion vs global amnesia   Happened in car after bending over, did not know where she was  No headache/dizziness  Got home fine but does not remember it   Also some cognitive issues/ brain fog since covid this summer  Labs drawn  Will consider imaging  Reassuring exam today       Anxious mood as adjustment reaction    With grief after losing mom and a friend  ? If playing a role in current fatigue and brain fog and sleep issues   Discussed treatment opt  Elected not to take lexapro- forgot to take and was concerned of sedation  Discussed opt of counseling  She has good support Will try fluoxetine 10 mg  Discussed expectations of SSRI medication including time to effectiveness and mechanism of action, also poss of side effects (early and late)- including mental fuzziness, weight or appetite change, nausea and poss of worse dep or anxiety (even suicidal thoughts)  Pt voiced understanding and will stop med and update if this occurs    Will plan follow up when labs return

## 2023-10-28 NOTE — Assessment & Plan Note (Addendum)
Some issues with brain fog/cognitive slow down after covid this summer  MMSE score today was 28/30-quite good  Clock draw was accurate but she drew both hands as one curved line Reports one episode of confusion vs amnesia as well   No doubt depresson/anx /grief may add to this   Reviewed last labs Folate and B12 levels today as well as tsh and lipiss Blood pressure has not been a problem   May consider imaging after results

## 2023-10-28 NOTE — Patient Instructions (Addendum)
I want to check your B12 and folate levels - these affect memory and cognition  Also thyroid/ cholesterol and vitamin D   I want to get those back   The generic fluoxetine 10 mg  is for depression /anxiety symptoms  Improved mood can help cognition and memory   Once we get labs back   I am interested in MRI or CT of brain once your labs return   Take care of yourself  Socialize when you can Stay active

## 2023-10-28 NOTE — Assessment & Plan Note (Signed)
With grief after losing mom and a friend  ? If playing a role in current fatigue and brain fog and sleep issues   Discussed treatment opt  Elected not to take lexapro- forgot to take and was concerned of sedation  Discussed opt of counseling  She has good support Will try fluoxetine 10 mg  Discussed expectations of SSRI medication including time to effectiveness and mechanism of action, also poss of side effects (early and late)- including mental fuzziness, weight or appetite change, nausea and poss of worse dep or anxiety (even suicidal thoughts)  Pt voiced understanding and will stop med and update if this occurs    Will plan follow up when labs return

## 2023-10-28 NOTE — Assessment & Plan Note (Signed)
Labs today

## 2023-10-28 NOTE — Assessment & Plan Note (Signed)
Ongoing since covid this summer  Now some cognitive changes /brain fog Considered going to health bar for vit infusion   Will check B12 and folate today before advising further   Depression with anxiety and grief may play a role

## 2023-10-28 NOTE — Assessment & Plan Note (Signed)
See a/p for anxiety and depression

## 2023-10-29 ENCOUNTER — Telehealth: Payer: Self-pay | Admitting: Family Medicine

## 2023-10-29 ENCOUNTER — Telehealth: Payer: Self-pay | Admitting: *Deleted

## 2023-10-29 MED ORDER — LEVOTHYROXINE SODIUM 100 MCG PO TABS: 100.0000 ug | ORAL_TABLET | Freq: Every day | ORAL | 0 refills | Status: DC

## 2023-10-29 NOTE — Telephone Encounter (Signed)
Pt viewed results and Dr. Royden Purl comments via Earleen Reaper, pt said she will keep an eye on her diet better and also she isn't sure if she has been taking her meds or taking them correctly. Rx for new thyroid dose sent to pharmacy.   Pt does want to proceed with imaging of brain, she would like to have it done in [place] pt advise PCP will put order in and if she hasn't received a call within 2 weeks to let us know.

## 2023-10-29 NOTE — Telephone Encounter (Signed)
-----   Message from [place] sent at 10/28/2023  7:48 PM EST ----- Vitamin B12 level is actually high  I advise not to do any infusions of this Folate level is normal  Cholesterol is up a bit (continue atorvastatin and work on diet to bring it down, if not improved later we may increase dose) Avoid red meat/ fried foods/ egg yolks/ fatty breakfast meats/ butter, cheese and high fat dairy/ and shellfish   Your TSH is up / you need more thyroid hormone- this may be the REASON for all the fatigue and cognitive issues (good news)  Please go up on levothyroxine from 75 mcg to 100 mcg (please send in #30 with 1 refill) I will have the office send it in  I want you to follow up in about a month for labs and a visit  If you still want to do brain imaging - we can proceed (if you want to wait to get on higher thyroid dose and discuss in a month , that is ok too)  Let me know what you prefer

## 2023-10-29 NOTE — Telephone Encounter (Signed)
Pt called stating she was told to let Dr. Milinda Antis know if she wanted to proceed with a CT scan or MRI or not. Pt requested a call back from [place] to discuss more. Call back # [phone]

## 2023-10-29 NOTE — Addendum Note (Signed)
Addended by: Roxy Manns A on: 10/29/2023 08:23 PM   Modules accepted: Orders

## 2023-10-29 NOTE — Telephone Encounter (Signed)
Addressed through results notes

## 2023-11-04 ENCOUNTER — Telehealth: Payer: Self-pay | Admitting: Family Medicine

## 2023-11-04 NOTE — Telephone Encounter (Signed)
Sending to referral dpt (order is in)

## 2023-11-04 NOTE — Telephone Encounter (Signed)
Will route to referral dpt

## 2023-11-04 NOTE — Telephone Encounter (Signed)
Pt called in wanting to know when will the CT or MRI be put in and pt would like a called back

## 2023-11-04 NOTE — Telephone Encounter (Signed)
Patient called in and stated that she hasn't heard anything in regards to getting her MRI scheduled. She would like a callback when available. Thank you!

## 2023-11-05 ENCOUNTER — Ambulatory Visit: Payer: PPO

## 2023-11-05 NOTE — Telephone Encounter (Signed)
Patient called in and stated that her cholesterol medication was suppose to be increased. She wants to know if it will be increased or not. Please advise.  Thank you!

## 2023-11-05 NOTE — Telephone Encounter (Signed)
From previous result note: Cholesterol is up a bit (continue atorvastatin and work on diet to bring it down, if not improved later we may increase dose)   I want her to work on diet  We can re check 3 months  If not improved would consider increasing dose   Good question

## 2023-11-06 ENCOUNTER — Telehealth: Payer: Self-pay | Admitting: Family Medicine

## 2023-11-06 MED ORDER — DENOSUMAB 60 MG/ML ~~LOC~~ SOSY
60.0000 mg | PREFILLED_SYRINGE | Freq: Once | SUBCUTANEOUS | Status: DC
Start: 2023-11-20 — End: 2024-06-26

## 2023-11-06 NOTE — Telephone Encounter (Signed)
Order placed to start auth

## 2023-11-06 NOTE — Addendum Note (Signed)
Addended by: Donnamarie Poag on: 11/06/2023 01:38 PM   Modules accepted: Orders

## 2023-11-06 NOTE — Telephone Encounter (Signed)
Good question It is taking a long time for MRIs to get read recently and I would put her follow up appointment out 1-2 weeks later  If we don't have a reading by then we can call for it  Thanks for letting me know

## 2023-11-06 NOTE — Telephone Encounter (Signed)
Patient called regarding prolia, says she had her last one in July and will be due soon. Please advise, thanks!

## 2023-11-06 NOTE — Telephone Encounter (Signed)
Pt notified of Dr. Royden Purl comments and fasting lab appt scheduled in 3 months.  ** pt did have another question: she has her MRI scheduled on 11/14/23 and then a f/u with PCP on 11/19/23, pt is asking if she needs to keep that appt or r/s for a later date so we have the results. Please advise **

## 2023-11-07 NOTE — Telephone Encounter (Signed)
Patient would like to know how she got the prolia last time, was this ordered and delivered through the pharmacy? Patient says she would like to do the same thing this time

## 2023-11-07 NOTE — Telephone Encounter (Signed)
Pt Is scheduled on 11/14/23 per appt desk.

## 2023-11-07 NOTE — Telephone Encounter (Signed)
Pt notified of Dr. Royden Purl comments and appt r/s to 12/10/23

## 2023-11-08 ENCOUNTER — Ambulatory Visit: Payer: PPO | Attending: Obstetrics and Gynecology

## 2023-11-08 DIAGNOSIS — M6281 Muscle weakness (generalized): Secondary | ICD-10-CM | POA: Insufficient documentation

## 2023-11-08 DIAGNOSIS — M25561 Pain in right knee: Secondary | ICD-10-CM | POA: Diagnosis not present

## 2023-11-08 NOTE — Therapy (Signed)
OUTPATIENT PHYSICAL THERAPY LOWER EXTREMITY TREATMENT   Patient Name: Gabrielle Webb MRN: [id number] DOB:[birth year], 82 y.o., female Today's Date: 11/08/2023  END OF SESSION:  PT End of Session - 11/08/23 1036     Visit Number 14    Number of Visits 17    Date for PT Re-Evaluation 11/13/23    PT Start Time 1032    PT Stop Time 1115    PT Time Calculation (min) 43 min    Activity Tolerance Patient tolerated treatment well    Behavior During Therapy [facility] for tasks assessed/performed             Past Medical History:  Diagnosis Date   Allergy    Anxiety    Breast cancer (HCC) 7/12   Right   Cellulitis    Diverticulosis of colon    GERD (gastroesophageal reflux disease)    Headache    sinus   History of hiatal hernia    Hx of radiation therapy 06/20/11 - 07/11/11   right breast   Hypothyroid    Osteoporosis    Personal history of radiation therapy 2012   Right Breast Cancer   PONV (postoperative nausea and vomiting)    Skin cancer    basal and squamous cell   Vertigo    Past Surgical History:  Procedure Laterality Date   ABDOMINAL HYSTERECTOMY N/A 11/08/2016   Procedure: HYSTERECTOMY TOTAL  ABDOMINAL;  Surgeon: Marcelle Overlie, MD;  Location: [facility];  Service: Gynecology;  Laterality: N/A;   APPENDECTOMY  07/2007   back sugery  1970   BREAST BIOPSY Left    Benign   BREAST EXCISIONAL BIOPSY Left 1983   BREAST EXCISIONAL BIOPSY Left 1983   BREAST LUMPECTOMY Right 05/16/2011   COLONOSCOPY     COLONOSCOPY WITH PROPOFOL N/A 08/27/2018   Procedure: COLONOSCOPY WITH PROPOFOL;  Surgeon: Scot Jun, MD;  Location: [facility] ENDOSCOPY;  Service: Endoscopy;  Laterality: N/A;   COLONOSCOPY WITH PROPOFOL N/A 08/16/2021   Procedure: COLONOSCOPY WITH PROPOFOL;  Surgeon: Earline Mayotte, MD;  Location: [facility] ENDOSCOPY;  Service: Endoscopy;  Laterality: N/A;   DIAGNOSTIC LAPAROSCOPY     DILATION AND CURETTAGE OF UTERUS N/A 05/31/2014   Procedure: DILATATION AND  CURETTAGE WITH ULTRASOUND GUIDANCE;  Surgeon: Jeani Hawking, MD;  Location: [facility];  Service: Gynecology;  Laterality: N/A;   ESOPHAGOGASTRODUODENOSCOPY  04/20/2003   KNEE SURGERY  1962   MASTECTOMY     right eye surgery     SALPINGOOPHORECTOMY Bilateral 11/08/2016   Procedure: BILATERAL SALPINGO OOPHORECTOMY;  Surgeon: Marcelle Overlie, MD;  Location: [facility];  Service: Gynecology;  Laterality: Bilateral;   UPPER GI ENDOSCOPY     Patient Active Problem List   Diagnosis Date Noted   Episode of confusion 10/28/2023   Memory change 10/02/2023   Left inguinal pain 08/21/2023   Normocytic anemia 07/09/2023   Post covid-19 condition, unspecified 07/05/2023   Dark stools 07/05/2023   Hypokalemia 06/30/2023   Skin lesion 05/02/2023   Right knee pain 04/24/2023   Vaginal pain 01/18/2023   Grief reaction 07/04/2022   Encounter for screening mammogram for breast cancer 12/18/2021   Anxious mood as adjustment reaction 06/09/2021   History of breast cancer 05/03/2020   Medicare annual wellness visit, subsequent 05/28/2018   Estrogen deficiency 05/28/2018   Fatigue 05/22/2017   S/P TAH-BSO 11/08/2016   Routine general medical examination at a health care facility 05/06/2016   Malignant neoplasm of upper-outer quadrant of right breast in female, estrogen  receptor positive (HCC) 03/21/2015   History of tamoxifen therapy 05/18/2014   Encounter for routine gynecological examination 05/06/2013   Colon cancer screening 05/06/2013   Encounter for Medicare annual wellness exam 04/28/2013   Hx of radiation therapy    Hx of Breast cancer, stage 1, Right, UOQ, Receptor+,Her2- 05/29/2011    Class: Stage 1   Other screening mammogram 03/21/2011   Post-menopausal 03/21/2011   Hypothyroidism 01/23/2008   Pure hypercholesterolemia 01/23/2008   Allergic rhinitis 01/23/2008   GERD 01/23/2008   Diverticulosis of colon 01/23/2008   IBS 01/23/2008   FIBROCYSTIC BREAST DISEASE 01/23/2008   Osteoporosis  10/17/2007    PCP: Judy Pimple MD  REFERRING PROVIDER: Marcelle Overlie, MD  REFERRING DIAG:  [phone] (ICD-10-CM) - Pain in right knee    THERAPY DIAG:  Right knee pain, unspecified chronicity  Muscle weakness (generalized)  Rationale for Evaluation and Treatment: Rehabilitation  ONSET DATE: April, 2024   SUBJECTIVE:   SUBJECTIVE STATEMENT: Pt reports her knee has been doing good after 2 weeks off from PT. Reports getting her MRI for her brain due to memory deficits.   PERTINENT HISTORY: Pt reports subacute R knee pain. This pain began following passive R knee flexion to end range during a pelvic PT visit, four months ago (April, 2024) and has continued to sustain the same level of pain since then. Pain reported at the anterior medial aspect of the R knee. Pain described as stabbing and sharp. Pt's pain with no activity is 0/10 NPS and 8-9/10 NPS with activity. Pt denies radicular symptoms in RLE, however does have LLE N/T. Pt has joint crepitus, popping and clicking in the knee joint. Pt's aggravating factors include prolonged walking, and stairs. The pain is relieved with a RLE knee sleeve, ice and OTC medications. Pt complaints of "pulling" when she flexes her knee and buckling of the knees 2/2 to weakness w/ ambulation.  PAIN:  Are you having pain? Yes: NPRS scale: 0/10 Pain location: Medial aspect of the anterior R knee Pain description: Sharp, stabbing pain  Aggravating factors: Prolonged walking, stairs  Relieving factors: Sleeve, OTC medications, ice  PRECAUTIONS: Fall  RED FLAGS: None   WEIGHT BEARING RESTRICTIONS: No  FALLS:  Has patient fallen in last 6 months? No  No stairs  OCCUPATION: Retired  PLOF: Independent  PATIENT GOALS: Want to walk without pain and reduce knee buckling.   NEXT MD VISIT: N/A for the knee   OBJECTIVE:   DIAGNOSTIC FINDINGS:  EXAM: RIGHT KNEE - COMPLETE 4+ VIEW   COMPARISON:  None Available.   FINDINGS: No acute  fracture or dislocation. Small joint effusion. Moderate to severe lateral and mild medial compartment joint space narrowing. Patellofemoral joint space is relatively preserved. Small tricompartmental marginal osteophytes. Osteopenia. Chondrocalcinosis of the menisci. Soft tissues are unremarkable.   Single AP view of the left knee is unremarkable, other than chondrocalcinosis of the menisci.   IMPRESSION: 1. Tricompartmental osteoarthritis, moderate to severe in the lateral compartment.  PATIENT SURVEYS:  FOTO 52/68  COGNITION: Overall cognitive status: Within functional limits for tasks assessed     SENSATION: WFL   MUSCLE LENGTH: Hamstrings: Limited RLE/LLE <90 deg.    POSTURE:  Increased knee valgus of the R knee.   PALPATION: No TTP noted at the anterior or posterior med/ lat oint line   LOWER EXTREMITY ROM:  Active ROM Right eval Left eval  Knee flexion 100 120  Knee extension 4 0   (Blank rows = not tested)  LOWER EXTREMITY MMT:  MMT Right eval Left eval  Hip flexion 4- 4  Hip extension 2+ 2+  Hip abduction 4- 4-  Hip adduction 4- 4-  Hip internal rotation 4- 4  Hip external rotation 4 4  Knee flexion 4 4  Knee extension 4 4  Ankle dorsiflexion 4+ 4+  Ankle plantarflexion 4 4   (Blank rows = not tested)   FUNCTIONAL TESTS:  5xSTS: 8.45 seconds 30 sec STS: 15  : 1600 ft 0/10NPS RLE   GAIT: Increased R knee valgus noted w/ ambulation.   JOINT MOBILITY: Mild R knee joint hypomobility felt in all planes. Typical R patellar movements noted w/ mobilizations.    TODAY'S TREATMENT:                                                                                                                              DATE: 11/08/23  There.ex:  Stationary Bike:  x5 minutes for LE strengthening and mobility lvl 3.0  Matrix hip exercises R/L:   Flexion: 2x12, 55#/ 55# 2x12  Abduction: 2x12, 40#/ 40# 2x12   Lateral lunges with 2 KG med ball:  2x8/side  Sled pushes w/ 50# 3 x50' down and back   PATIENT EDUCATION:  Education details: HEP given to pt, prognosis Person educated: Patient Education method: Explanation and Demonstration Education comprehension: verbalized understanding and returned demonstration  HOME EXERCISE PROGRAM: Access Code: [email or web address] URL: [email or web address] Date: 09/24/2023 Prepared by: Ronnie Derby  Exercises - Squat with Counter Support  - 1 x daily - 3-4 x weekly - 3 sets - 8 reps - Side Stepping with Resistance at Ankles  - 1 x daily - 3-4 x weekly - 3 sets - 5 reps - Side Lunge with Counter Support  - 1 x daily - 3-4 x weekly - 3 sets - 6 reps   ASSESSMENT:  CLINICAL IMPRESSION: Continuing PT POC with progression in therex today via increased resistance and varying modes of single leg strengthening to provide alternative muscular recruitment to prevent strength plateau. Pt continuing to progress without R knee pain. Pt approaching end of POC and is agreeable for goal re-assessment and likely discharge next visit. Pt will benefit from continued SLS strengthening activities to further improve balance, stability, and overall LE strength to improve QoL and return to PLOF.   OBJECTIVE IMPAIRMENTS: decreased balance, decreased mobility, difficulty walking, decreased ROM, decreased strength, impaired flexibility, and pain.   ACTIVITY LIMITATIONS: carrying, lifting, squatting, and locomotion level  PARTICIPATION LIMITATIONS: community activity  PERSONAL FACTORS: Age, Past/current experiences, Time since onset of injury/illness/exacerbation, and 1 comorbidity: Osteoporosis  are also affecting patient's functional outcome.   REHAB POTENTIAL: Good  CLINICAL DECISION MAKING: Stable/uncomplicated  EVALUATION COMPLEXITY: Low   GOALS: Goals reviewed with patient? Yes  SHORT TERM GOALS: Target date: 09/17/23 Pt will be independent with HEP to improve R knee strength and decrease pain  with functional activities  Baseline: 08/21/23: HEP given to pt 10/02/23:  HEP compliant Goal status: MET   LONG TERM GOALS: Target date: 11/13/23  Pt will improve FOTO to target score to demonstrate clinically significant improvement in functional mobility.  Baseline: 08/21/23: 52/68 10/02/23: 57/68 10/09/23: 72/68 Goal status: MET  2.  Pt will improve 5xSTS to </= 14.8 seconds to display improvements in LE strength and equal age- matched norms.  Baseline: 08/21/23: Deferred to next session 09/24/23: 8.45 seconds Goal status: MET  3.  Pt will report <6/10 pain in R knee w/ prolonged ambulation to display clinically significant pain improvement. Baseline:08/21/23: 8/10, 10, 10/02/23: 2/10 Goal status: MET  4. Pt will improve R knee flexion AROM to 120 deg and R knee extension to 0 deg to be equal to L knee AROM for improved functional mobility.   Baseline: 08/21/23: Active ROM Right eval Left eval Right 10/30  Knee flexion 100 120 124  Knee extension 4 0 0   (Blank rows = not tested)  Goal status: MET   5. Pt will improve RLE 3 rep max to be >/+ to the LLE to demonstrate functional strength improvements of the RLE.  Baseline: 09/06/23: RLE- 20# LLE- 35#  Goal status: INITIAL    PLAN:  PT FREQUENCY: 2x/week  PT DURATION: 6 weeks  PLANNED INTERVENTIONS: Therapeutic exercises, Therapeutic activity, Neuromuscular re-education, Balance training, Gait training, Patient/Family education, Self Care, Joint mobilization, Joint manipulation, Stair training, Spinal manipulation, Spinal mobilization, Cryotherapy, Moist heat, Manual therapy, and Re-evaluation  PLAN FOR NEXT SESSION:  Discharge. Continue to progress single limb strengthening on LLE   Elvis Laufer M. Fairly IV, PT, DPT Physical Therapist- [place]  [facility] 11/08/2023, 12:15 PM

## 2023-11-11 ENCOUNTER — Ambulatory Visit: Payer: PPO

## 2023-11-11 ENCOUNTER — Telehealth: Payer: Self-pay

## 2023-11-11 DIAGNOSIS — M6281 Muscle weakness (generalized): Secondary | ICD-10-CM

## 2023-11-11 DIAGNOSIS — M25561 Pain in right knee: Secondary | ICD-10-CM

## 2023-11-11 NOTE — Telephone Encounter (Signed)
Patient called to f/u on PA request, have we received a response?

## 2023-11-11 NOTE — Telephone Encounter (Signed)
Prolia VOB initiated via [email or web address]  Next Prolia inj DUE: 12/03/23

## 2023-11-11 NOTE — Telephone Encounter (Signed)
Created new encounter for Prolia BIV. Will route encounter back once benefit verification is complete.  

## 2023-11-11 NOTE — Therapy (Signed)
OUTPATIENT PHYSICAL THERAPY LOWER EXTREMITY TREATMENT   Patient Name: Gabrielle Webb MRN: [id number] DOB:1941/01/07, 82 y.o., female Today's Date: 11/11/2023  END OF SESSION:  PT End of Session - 11/11/23 0939     Visit Number 15    Number of Visits 17    Date for PT Re-Evaluation 11/13/23    PT Start Time 0910    PT Stop Time 0930    PT Time Calculation (min) 20 min    Activity Tolerance Patient tolerated treatment well    Behavior During Therapy [facility] for tasks assessed/performed              Past Medical History:  Diagnosis Date   Allergy    Anxiety    Breast cancer (HCC) 7/12   Right   Cellulitis    Diverticulosis of colon    GERD (gastroesophageal reflux disease)    Headache    sinus   History of hiatal hernia    Hx of radiation therapy 06/20/11 - 07/11/11   right breast   Hypothyroid    Osteoporosis    Personal history of radiation therapy 2012   Right Breast Cancer   PONV (postoperative nausea and vomiting)    Skin cancer    basal and squamous cell   Vertigo    Past Surgical History:  Procedure Laterality Date   ABDOMINAL HYSTERECTOMY N/A 11/08/2016   Procedure: HYSTERECTOMY TOTAL  ABDOMINAL;  Surgeon: Marcelle Overlie, MD;  Location: [facility];  Service: Gynecology;  Laterality: N/A;   APPENDECTOMY  07/2007   back sugery  1970   BREAST BIOPSY Left    Benign   BREAST EXCISIONAL BIOPSY Left 1983   BREAST EXCISIONAL BIOPSY Left 1983   BREAST LUMPECTOMY Right 05/16/2011   COLONOSCOPY     COLONOSCOPY WITH PROPOFOL N/A 08/27/2018   Procedure: COLONOSCOPY WITH PROPOFOL;  Surgeon: Scot Jun, MD;  Location: [facility] ENDOSCOPY;  Service: Endoscopy;  Laterality: N/A;   COLONOSCOPY WITH PROPOFOL N/A 08/16/2021   Procedure: COLONOSCOPY WITH PROPOFOL;  Surgeon: Earline Mayotte, MD;  Location: [facility] ENDOSCOPY;  Service: Endoscopy;  Laterality: N/A;   DIAGNOSTIC LAPAROSCOPY     DILATION AND CURETTAGE OF UTERUS N/A 05/31/2014   Procedure: DILATATION AND  CURETTAGE WITH ULTRASOUND GUIDANCE;  Surgeon: Jeani Hawking, MD;  Location: [facility];  Service: Gynecology;  Laterality: N/A;   ESOPHAGOGASTRODUODENOSCOPY  04/20/2003   KNEE SURGERY  1962   MASTECTOMY     right eye surgery     SALPINGOOPHORECTOMY Bilateral 11/08/2016   Procedure: BILATERAL SALPINGO OOPHORECTOMY;  Surgeon: Marcelle Overlie, MD;  Location: [facility];  Service: Gynecology;  Laterality: Bilateral;   UPPER GI ENDOSCOPY     Patient Active Problem List   Diagnosis Date Noted   Episode of confusion 10/28/2023   Memory change 10/02/2023   Left inguinal pain 08/21/2023   Normocytic anemia 07/09/2023   Post covid-19 condition, unspecified 07/05/2023   Dark stools 07/05/2023   Hypokalemia 06/30/2023   Skin lesion 05/02/2023   Right knee pain 04/24/2023   Vaginal pain 01/18/2023   Grief reaction 07/04/2022   Encounter for screening mammogram for breast cancer 12/18/2021   Anxious mood as adjustment reaction 06/09/2021   History of breast cancer 05/03/2020   Medicare annual wellness visit, subsequent 05/28/2018   Estrogen deficiency 05/28/2018   Fatigue 05/22/2017   S/P TAH-BSO 11/08/2016   Routine general medical examination at a health care facility 05/06/2016   Malignant neoplasm of upper-outer quadrant of right breast in female,  estrogen receptor positive (HCC) 03/21/2015   History of tamoxifen therapy 05/18/2014   Encounter for routine gynecological examination 05/06/2013   Colon cancer screening 05/06/2013   Encounter for Medicare annual wellness exam 04/28/2013   Hx of radiation therapy    Hx of Breast cancer, stage 1, Right, UOQ, Receptor+,Her2- 05/29/2011    Class: Stage 1   Other screening mammogram 03/21/2011   Post-menopausal 03/21/2011   Hypothyroidism 01/23/2008   Pure hypercholesterolemia 01/23/2008   Allergic rhinitis 01/23/2008   GERD 01/23/2008   Diverticulosis of colon 01/23/2008   IBS 01/23/2008   FIBROCYSTIC BREAST DISEASE 01/23/2008   Osteoporosis  10/17/2007    PCP: Judy Pimple MD  REFERRING PROVIDER: Marcelle Overlie, MD  REFERRING DIAG:  [phone] (ICD-10-CM) - Pain in right knee    THERAPY DIAG:  Right knee pain, unspecified chronicity  Muscle weakness (generalized)  Rationale for Evaluation and Treatment: Rehabilitation  ONSET DATE: April, 2024   SUBJECTIVE:   SUBJECTIVE STATEMENT: Pt reports fatigue today from hanging up Christmas decorations yesterday. Agreeable to discharge today.  PERTINENT HISTORY: Pt reports subacute R knee pain. This pain began following passive R knee flexion to end range during a pelvic PT visit, four months ago (April, 2024) and has continued to sustain the same level of pain since then. Pain reported at the anterior medial aspect of the R knee. Pain described as stabbing and sharp. Pt's pain with no activity is 0/10 NPS and 8-9/10 NPS with activity. Pt denies radicular symptoms in RLE, however does have LLE N/T. Pt has joint crepitus, popping and clicking in the knee joint. Pt's aggravating factors include prolonged walking, and stairs. The pain is relieved with a RLE knee sleeve, ice and OTC medications. Pt complaints of "pulling" when she flexes her knee and buckling of the knees 2/2 to weakness w/ ambulation.  PAIN:  Are you having pain? Yes: NPRS scale: 0/10 Pain location: Medial aspect of the anterior R knee Pain description: Sharp, stabbing pain  Aggravating factors: Prolonged walking, stairs  Relieving factors: Sleeve, OTC medications, ice  PRECAUTIONS: Fall  RED FLAGS: None   WEIGHT BEARING RESTRICTIONS: No  FALLS:  Has patient fallen in last 6 months? No  No stairs  OCCUPATION: Retired  PLOF: Independent  PATIENT GOALS: Want to walk without pain and reduce knee buckling.   NEXT MD VISIT: N/A for the knee   OBJECTIVE:   DIAGNOSTIC FINDINGS:  EXAM: RIGHT KNEE - COMPLETE 4+ VIEW   COMPARISON:  None Available.   FINDINGS: No acute fracture or dislocation. Small  joint effusion. Moderate to severe lateral and mild medial compartment joint space narrowing. Patellofemoral joint space is relatively preserved. Small tricompartmental marginal osteophytes. Osteopenia. Chondrocalcinosis of the menisci. Soft tissues are unremarkable.   Single AP view of the left knee is unremarkable, other than chondrocalcinosis of the menisci.   IMPRESSION: 1. Tricompartmental osteoarthritis, moderate to severe in the lateral compartment.  PATIENT SURVEYS:  FOTO 52/68  COGNITION: Overall cognitive status: Within functional limits for tasks assessed     SENSATION: WFL   MUSCLE LENGTH: Hamstrings: Limited RLE/LLE <90 deg.    POSTURE:  Increased knee valgus of the R knee.   PALPATION: No TTP noted at the anterior or posterior med/ lat oint line   LOWER EXTREMITY ROM:  Active ROM Right eval Left eval  Knee flexion 100 120  Knee extension 4 0   (Blank rows = not tested)  LOWER EXTREMITY MMT:  MMT Right eval Left eval  Hip flexion 4- 4  Hip extension 2+ 2+  Hip abduction 4- 4-  Hip adduction 4- 4-  Hip internal rotation 4- 4  Hip external rotation 4 4  Knee flexion 4 4  Knee extension 4 4  Ankle dorsiflexion 4+ 4+  Ankle plantarflexion 4 4   (Blank rows = not tested)   FUNCTIONAL TESTS:  5xSTS: 8.45 seconds 30 sec STS: 15  : 1600 ft 0/10NPS RLE   GAIT: Increased R knee valgus noted w/ ambulation.   JOINT MOBILITY: Mild R knee joint hypomobility felt in all planes. Typical R patellar movements noted w/ mobilizations.    TODAY'S TREATMENT:                                                                                                                              DATE: 11/11/23  There.ex:  Nu-step:  x4 minutes for LE strengthening and mobility lvl 5.0  Review of 3 rep max goal.   Reviewed updated HEP with education on post PT gym membership benefits for home based management.   PATIENT EDUCATION:  Education details: HEP  given to pt, prognosis Person educated: Patient Education method: Medical illustrator Education comprehension: verbalized understanding and returned demonstration  HOME EXERCISE PROGRAM: Access Code: [email or web address] URL: [email or web address] Date: 09/24/2023 Prepared by: Ronnie Derby  Exercises - Squat with Counter Support  - 1 x daily - 3-4 x weekly - 3 sets - 8 reps - Side Stepping with Resistance at Ankles  - 1 x daily - 3-4 x weekly - 3 sets - 5 reps - Side Lunge with Counter Support  - 1 x daily - 3-4 x weekly - 3 sets - 6 reps   ASSESSMENT:  CLINICAL IMPRESSION: Pt has met all goals exceeding 3 RM strength on R knee. Pt has returned to full and painless household and community ADL completion. Pt's HEP was updated today with re-education on reps/sets/frequency. Further education provided on self management with gym membership for further R knee/hip strength gains as pt remains with greater weakness in RLE compared to LLE. Pt understanding of education and is agreeable for discharge with all questions answered. Pt formally discharged from PT at this time.   OBJECTIVE IMPAIRMENTS: decreased balance, decreased mobility, difficulty walking, decreased ROM, decreased strength, impaired flexibility, and pain.   ACTIVITY LIMITATIONS: carrying, lifting, squatting, and locomotion level  PARTICIPATION LIMITATIONS: community activity  PERSONAL FACTORS: Age, Past/current experiences, Time since onset of injury/illness/exacerbation, and 1 comorbidity: Osteoporosis  are also affecting patient's functional outcome.   REHAB POTENTIAL: Good  CLINICAL DECISION MAKING: Stable/uncomplicated  EVALUATION COMPLEXITY: Low   GOALS: Goals reviewed with patient? Yes  SHORT TERM GOALS: Target date: 09/17/23 Pt will be independent with HEP to improve R knee strength and decrease pain with functional activities  Baseline: 08/21/23: HEP given to pt 10/02/23: HEP compliant Goal  status: MET   LONG TERM GOALS: Target date: 11/13/23  Pt will improve FOTO to target  score to demonstrate clinically significant improvement in functional mobility.  Baseline: 08/21/23: 52/68 10/02/23: 57/68 10/09/23: 72/68 Goal status: MET  2.  Pt will improve 5xSTS to </= 14.8 seconds to display improvements in LE strength and equal age- matched norms.  Baseline: 08/21/23: Deferred to next session 09/24/23: 8.45 seconds Goal status: MET  3.  Pt will report <6/10 pain in R knee w/ prolonged ambulation to display clinically significant pain improvement. Baseline:08/21/23: 8/10, 10, 10/02/23: 2/10 Goal status: MET  4. Pt will improve R knee flexion AROM to 120 deg and R knee extension to 0 deg to be equal to L knee AROM for improved functional mobility.   Baseline: 08/21/23: Active ROM Right eval Left eval Right 10/30  Knee flexion 100 120 124  Knee extension 4 0 0   (Blank rows = not tested)  Goal status: MET   5. Pt will improve RLE 3 rep max to be >/+ to the LLE to demonstrate functional strength improvements of the RLE.  Baseline: 09/06/23: RLE- 20# LLE- 35#; 11/11/23: RLE- 35#, LLE: 55# Goal status: MET    PLAN:  PT FREQUENCY: 2x/week  PT DURATION: 6 weeks  PLANNED INTERVENTIONS: Therapeutic exercises, Therapeutic activity, Neuromuscular re-education, Balance training, Gait training, Patient/Family education, Self Care, Joint mobilization, Joint manipulation, Stair training, Spinal manipulation, Spinal mobilization, Cryotherapy, Moist heat, Manual therapy, and Re-evaluation  PLAN FOR NEXT SESSION:  N/A   Delphia Grates. Fairly IV, PT, DPT Physical Therapist- [place]  [facility] 11/11/2023, 9:45 AM

## 2023-11-12 ENCOUNTER — Other Ambulatory Visit: Payer: Self-pay | Admitting: Family Medicine

## 2023-11-12 ENCOUNTER — Other Ambulatory Visit: Payer: Self-pay

## 2023-11-12 ENCOUNTER — Other Ambulatory Visit (HOSPITAL_COMMUNITY): Payer: Self-pay

## 2023-11-12 DIAGNOSIS — H04123 Dry eye syndrome of bilateral lacrimal glands: Secondary | ICD-10-CM | POA: Diagnosis not present

## 2023-11-12 DIAGNOSIS — B880 Other acariasis: Secondary | ICD-10-CM | POA: Diagnosis not present

## 2023-11-12 DIAGNOSIS — E0500 Thyrotoxicosis with diffuse goiter without thyrotoxic crisis or storm: Secondary | ICD-10-CM | POA: Diagnosis not present

## 2023-11-12 DIAGNOSIS — H0100A Unspecified blepharitis right eye, upper and lower eyelids: Secondary | ICD-10-CM | POA: Diagnosis not present

## 2023-11-12 NOTE — Telephone Encounter (Signed)
Pt ready for scheduling for PROLIA on or after : 12/03/23  Out-of-pocket cost due at time of visit: $340  Primary: [employer] Prolia co-insurance: 20% Admin fee co-insurance: $20  Secondary: --- Prolia co-insurance:  Admin fee co-insurance:   Medical Benefit Details: Date Benefits were checked: 11/12/23 Deductible: NO/ Coinsurance: 20%/ Admin Fee: $20  Prior Auth: N/A PA# Expiration Date:   # of doses approved:  Pharmacy benefit: Copay $200 If patient wants fill through the pharmacy benefit please send prescription to: [employer], and include estimated need by date in rx notes. Pharmacy will ship medication directly to the office.  Patient NOT eligible for Prolia Copay Card. Copay Card can make patient's cost as little as $25. Link to apply: [email or web address]://[email or web address]  ** This summary of benefits is an estimation of the patient's out-of-pocket cost. Exact cost may very based on individual plan coverage.

## 2023-11-12 NOTE — Telephone Encounter (Signed)
Will hold until Gabrielle Webb returns since pt isn't due until 12/04/23 per prev note

## 2023-11-12 NOTE — Telephone Encounter (Signed)
Pt called stating she received a call from [facility] speciality pharmacy stating she needed to reach out to our office to schedule labs for her prolia. Pt sates she was also advised to get our office to contact the pharmacy. Please advise. Call back # [phone]

## 2023-11-13 ENCOUNTER — Other Ambulatory Visit (HOSPITAL_COMMUNITY): Payer: Self-pay

## 2023-11-13 ENCOUNTER — Other Ambulatory Visit: Payer: Self-pay

## 2023-11-13 DIAGNOSIS — D045 Carcinoma in situ of skin of trunk: Secondary | ICD-10-CM | POA: Diagnosis not present

## 2023-11-13 DIAGNOSIS — D485 Neoplasm of uncertain behavior of skin: Secondary | ICD-10-CM | POA: Diagnosis not present

## 2023-11-13 NOTE — Telephone Encounter (Signed)
Gabrielle Webb from [facility] pharmacy called in and wanted to understand why rx denied as responded to by other means. She stated that they haven't received anything in regards to this medication. She can be reached at [phone]. Please advise. Thank you!

## 2023-11-14 ENCOUNTER — Ambulatory Visit
Admission: RE | Admit: 2023-11-14 | Discharge: 2023-11-14 | Disposition: A | Discharge status: Home / Self Care | Payer: PPO | Source: Ambulatory Visit | Attending: Family Medicine | Admitting: Family Medicine

## 2023-11-14 DIAGNOSIS — R4182 Altered mental status, unspecified: Secondary | ICD-10-CM | POA: Diagnosis not present

## 2023-11-14 DIAGNOSIS — R413 Other amnesia: Secondary | ICD-10-CM | POA: Insufficient documentation

## 2023-11-14 DIAGNOSIS — R9089 Other abnormal findings on diagnostic imaging of central nervous system: Secondary | ICD-10-CM | POA: Diagnosis not present

## 2023-11-14 DIAGNOSIS — I6782 Cerebral ischemia: Secondary | ICD-10-CM | POA: Diagnosis not present

## 2023-11-14 DIAGNOSIS — J320 Chronic maxillary sinusitis: Secondary | ICD-10-CM | POA: Diagnosis not present

## 2023-11-14 DIAGNOSIS — R410 Disorientation, unspecified: Secondary | ICD-10-CM | POA: Diagnosis not present

## 2023-11-14 MED ORDER — GADOBUTROL 1 MMOL/ML IV SOLN
6.0000 mL | Freq: Once | INTRAVENOUS | Status: AC | PRN
  Administered 2023-11-14: 6 mL via INTRAVENOUS

## 2023-11-15 ENCOUNTER — Other Ambulatory Visit: Payer: Self-pay

## 2023-11-15 ENCOUNTER — Telehealth: Payer: Self-pay | Admitting: *Deleted

## 2023-11-15 ENCOUNTER — Other Ambulatory Visit (HOSPITAL_COMMUNITY): Payer: Self-pay

## 2023-11-15 MED ORDER — AMOXICILLIN-POT CLAVULANATE 875-125 MG PO TABS: 1.0000 | ORAL_TABLET | Freq: Two times a day (BID) | ORAL | 0 refills | Status: DC

## 2023-11-15 NOTE — Telephone Encounter (Signed)
-----   Message from [place] sent at 11/15/2023  4:25 PM EST ----- Your MRI was overall reassuring / some age related change but no focal stroke or tumor  It did however show evidence of some bad sinusitis on left side  Any sinus pain or pressure? Please sent to pref pharmacy  Augmentin 875 mg 1 po bid for 10 d #20 no refills Take as directed  We will re evaluate this at your follow up

## 2023-11-15 NOTE — Telephone Encounter (Signed)
Delaney Meigs from [facility] pharmacy called over to follow up on this message. Thank you!

## 2023-11-15 NOTE — Telephone Encounter (Signed)
Left VM requesting pt to call the office back or check her mychart.  Rx sent to pharmacy

## 2023-11-18 ENCOUNTER — Other Ambulatory Visit (HOSPITAL_COMMUNITY): Payer: Self-pay

## 2023-11-19 ENCOUNTER — Ambulatory Visit: Payer: PPO | Admitting: Family Medicine

## 2023-11-20 ENCOUNTER — Other Ambulatory Visit: Payer: Self-pay

## 2023-11-20 DIAGNOSIS — M810 Age-related osteoporosis without current pathological fracture: Secondary | ICD-10-CM

## 2023-11-20 MED ORDER — PROLIA 60 MG/ML ~~LOC~~ SOSY
60.0000 mg | PREFILLED_SYRINGE | SUBCUTANEOUS | 0 refills | Status: DC
Start: 2023-11-20 — End: 2024-05-11
  Filled 2023-11-20: qty 1, 180d supply, fill #0

## 2023-11-20 NOTE — Telephone Encounter (Signed)
Per Dr. Milinda Antis ok to use labs that were done on 11/24. I have called patient and set up labs. Provided phone number for her to contact the pharmacy about getting shipped. She will reach out if any issues getting sent.

## 2023-11-20 NOTE — Progress Notes (Signed)
Specialty Pharmacy Refill Coordination Note  Gabrielle Webb is a 82 y.o. female contacted today regarding refills of specialty medication(s) Denosumab (Prolia)   Patient requested Courier to Provider Office   Delivery date: 11/25/23   Verified address: [facility] [address] [place], [place] [address]   Medication will be filled on 11/22/23.

## 2023-11-20 NOTE — Telephone Encounter (Signed)
Patient called in to follow up on getting her Prolia shot scheduled.

## 2023-11-20 NOTE — Telephone Encounter (Signed)
Addressed/ scheduled with Park Meo

## 2023-11-22 ENCOUNTER — Other Ambulatory Visit: Payer: Self-pay

## 2023-11-25 NOTE — Telephone Encounter (Signed)
Received Prolia

## 2023-11-28 ENCOUNTER — Telehealth: Payer: Self-pay | Admitting: *Deleted

## 2023-11-28 NOTE — Telephone Encounter (Signed)
Copied from CRM [id number]. Topic: Clinical - Medication Question >> Nov 28, 2023 12:28 PM Alcus Dad H wrote: Reason for CRM: Pt would like a call back from the person who gives the prolia shot, thinks her name is Verlon Au. She is needing some clarification about the medication. Cb number is [phone]

## 2023-11-29 NOTE — Telephone Encounter (Signed)
Called patient back they wanted to verify that we had everything set up for injection. Advised we do and we will see her on scheduled date.

## 2023-12-03 ENCOUNTER — Ambulatory Visit (INDEPENDENT_AMBULATORY_CARE_PROVIDER_SITE_OTHER): Payer: PPO

## 2023-12-03 DIAGNOSIS — M810 Age-related osteoporosis without current pathological fracture: Secondary | ICD-10-CM | POA: Diagnosis not present

## 2023-12-03 MED ORDER — DENOSUMAB 60 MG/ML ~~LOC~~ SOSY
60.0000 mg | PREFILLED_SYRINGE | Freq: Once | SUBCUTANEOUS | Status: AC
Start: 2023-12-03 — End: 2023-12-03
  Administered 2023-12-03: 60 mg via SUBCUTANEOUS

## 2023-12-03 MED ORDER — DENOSUMAB 60 MG/ML ~~LOC~~ SOSY
60.0000 mg | PREFILLED_SYRINGE | Freq: Once | SUBCUTANEOUS | Status: DC
Start: 2024-06-01 — End: 2024-06-26

## 2023-12-03 NOTE — Progress Notes (Signed)
 Per orders of Dr. Roxy Manns, injection of prolia 60 mg given by Lewanda Rife in left deltoid. Patient tolerated injection well. Patient will make appointment for 6 month.

## 2023-12-05 DIAGNOSIS — D045 Carcinoma in situ of skin of trunk: Secondary | ICD-10-CM | POA: Diagnosis not present

## 2023-12-08 ENCOUNTER — Ambulatory Visit
Admission: EM | Admit: 2023-12-08 | Discharge: 2023-12-08 | Disposition: A | Discharge status: Home / Self Care | Payer: PPO | Attending: Emergency Medicine | Admitting: Emergency Medicine

## 2023-12-08 ENCOUNTER — Encounter: Payer: Self-pay | Admitting: *Deleted

## 2023-12-08 DIAGNOSIS — R300 Dysuria: Secondary | ICD-10-CM | POA: Insufficient documentation

## 2023-12-08 LAB — POCT URINALYSIS DIP (MANUAL ENTRY)
Bilirubin, UA: NEGATIVE
Glucose, UA: NEGATIVE mg/dL
Ketones, POC UA: NEGATIVE mg/dL
Nitrite, UA: NEGATIVE
Protein Ur, POC: NEGATIVE mg/dL
Spec Grav, UA: 1.02 (ref 1.010–1.025)
Urobilinogen, UA: 0.2 U/dL
pH, UA: 8.5 — AB (ref 5.0–8.0)

## 2023-12-08 MED ORDER — NITROFURANTOIN MONOHYD MACRO 100 MG PO CAPS: 100.0000 mg | ORAL_CAPSULE | Freq: Two times a day (BID) | ORAL | 0 refills | Status: DC

## 2023-12-08 NOTE — Discharge Instructions (Addendum)
 Your urinalysis shows Gabrielle Webb blood cells but does not currently show bacteria, your urine will be sent to the lab to determine exactly which bacteria is present, if any changes need to be made to your medications you will be notified  Begin use of her bed every morning and every evening for 5 days  You may use over-the-counter pyridium  to help minimize your symptoms until antibiotic removes bacteria, this medication will turn your urine orange  Increase your fluid intake through use of water  As always practice good hygiene, wiping front to back and avoidance of scented vaginal products to prevent further irritation  If symptoms continue to persist after use of medication or recur please follow-up with urgent care or your primary doctor as needed

## 2023-12-08 NOTE — ED Triage Notes (Signed)
 C/O dysuria, urinary urgency, and polyuria onset yesterday. Today c/o hematuria. Denies fevers.

## 2023-12-08 NOTE — ED Provider Notes (Signed)
 Gabrielle Webb    CSN: [id number] Arrival date & time: 12/08/23  9165      History   Chief Complaint Chief Complaint  Patient presents with   Hematuria    HPI Gabrielle Webb is a 83 y.o. female.   Patient presents for evaluation of urinary frequency, urgency and dysuria beginning 1 day ago, experiencing hematuria today.  Associated lower abdominal pain described as sharp radiating into the back.  Has not attempted treatment.  Denies fever, flank pain or vaginal symptoms.  Past Medical History:  Diagnosis Date   Allergy    Anxiety    Breast cancer (HCC) 7/12   Right   Cellulitis    Diverticulosis of colon    GERD (gastroesophageal reflux disease)    Headache    sinus   History of hiatal hernia    Hx of radiation therapy 06/20/11 - 07/11/11   right breast   Hypothyroid    Osteoporosis    Personal history of radiation therapy 2012   Right Breast Cancer   PONV (postoperative nausea and vomiting)    Skin cancer    basal and squamous cell   Vertigo     Patient Active Problem List   Diagnosis Date Noted   Episode of confusion 10/28/2023   Memory change 10/02/2023   Left inguinal pain 08/21/2023   Normocytic anemia 07/09/2023   Post covid-19 condition, unspecified 07/05/2023   Dark stools 07/05/2023   Hypokalemia 06/30/2023   Skin lesion 05/02/2023   Right knee pain 04/24/2023   Vaginal pain 01/18/2023   Grief reaction 07/04/2022   Encounter for screening mammogram for breast cancer 12/18/2021   Anxious mood as adjustment reaction 06/09/2021   History of breast cancer 05/03/2020   Medicare annual wellness visit, subsequent 05/28/2018   Estrogen deficiency 05/28/2018   Fatigue 05/22/2017   S/P TAH-BSO 11/08/2016   Routine general medical examination at a health care facility 05/06/2016   Malignant neoplasm of upper-outer quadrant of right breast in female, estrogen receptor positive (HCC) 03/21/2015   History of tamoxifen  therapy 05/18/2014   Encounter  for routine gynecological examination 05/06/2013   Colon cancer screening 05/06/2013   Encounter for Medicare annual wellness exam 04/28/2013   Hx of radiation therapy    Hx of Breast cancer, stage 1, Right, UOQ, Receptor+,Her2- 05/29/2011    Class: Stage 1   Other screening mammogram 03/21/2011   Post-menopausal 03/21/2011   Hypothyroidism 01/23/2008   Pure hypercholesterolemia 01/23/2008   Allergic rhinitis 01/23/2008   GERD 01/23/2008   Diverticulosis of colon 01/23/2008   IBS 01/23/2008   FIBROCYSTIC BREAST DISEASE 01/23/2008   Osteoporosis 10/17/2007    Past Surgical History:  Procedure Laterality Date   ABDOMINAL HYSTERECTOMY N/A 11/08/2016   Procedure: HYSTERECTOMY TOTAL  ABDOMINAL;  Surgeon: [name], MD;  Location: [facility];  Service: Gynecology;  Laterality: N/A;   APPENDECTOMY  07/2007   back sugery  1970   BREAST BIOPSY Left    Benign   BREAST EXCISIONAL BIOPSY Left 1983   BREAST EXCISIONAL BIOPSY Left 1983   BREAST LUMPECTOMY Right 05/16/2011   COLONOSCOPY     COLONOSCOPY WITH PROPOFOL  N/A 08/27/2018   Procedure: COLONOSCOPY WITH PROPOFOL ;  Surgeon: [name], MD;  Location: [facility] ENDOSCOPY;  Service: Endoscopy;  Laterality: N/A;   COLONOSCOPY WITH PROPOFOL  N/A 08/16/2021   Procedure: COLONOSCOPY WITH PROPOFOL ;  Surgeon: [name], MD;  Location: [facility] ENDOSCOPY;  Service: Endoscopy;  Laterality: N/A;   DIAGNOSTIC LAPAROSCOPY  DILATION AND CURETTAGE OF UTERUS N/A 05/31/2014   Procedure: DILATATION AND CURETTAGE WITH ULTRASOUND GUIDANCE;  Surgeon: Rosaline LITTIE Cobble, MD;  Location: [facility];  Service: Gynecology;  Laterality: N/A;   ESOPHAGOGASTRODUODENOSCOPY  04/20/2003   KNEE SURGERY  1962   MASTECTOMY     right eye surgery     SALPINGOOPHORECTOMY Bilateral 11/08/2016   Procedure: BILATERAL SALPINGO OOPHORECTOMY;  Surgeon: [name], MD;  Location: [facility];  Service: Gynecology;  Laterality: Bilateral;   UPPER GI ENDOSCOPY      OB  History   No obstetric history on file.      Home Medications    Prior to Admission medications   Medication Sig Start Date End Date Taking? Authorizing Provider  atorvastatin  (LIPITOR) 10 MG tablet TAKE 1 TABLET BY MOUTH DAILY 05/03/23  Yes Tower, Laine LABOR, MD  BIOTIN PO Take by mouth daily.   Yes [provider]  CALCIUM  PO Take 1,500 mg by mouth daily.   Yes [provider]  chlorthalidone  (HYGROTON ) 25 MG tablet TAKE TWO TABLETS EVERY DAY AS NEEDED FORSWELLING 06/28/21  Yes Tower, Laine LABOR, MD  Cholecalciferol (VITAMIN D ) 2000 UNITS CAPS Take 1 capsule by mouth daily.   Yes [provider]  Cyanocobalamin  (B-12 PO) Take 2,500 mcg by mouth daily.   Yes [provider]  denosumab  (PROLIA ) 60 MG/ML SOSY injection Inject 60 mg into the skin every 6 (six) months. 11/20/23  Yes Tower, Laine LABOR, MD  dicyclomine  (BENTYL ) 10 MG capsule TAKE 1 CAPSULE BY MOUTH TWICE DAILY FOR SPASMS 03/13/23  Yes Tower, Laine LABOR, MD  escitalopram  (LEXAPRO ) 5 MG tablet Take 5 mg by mouth daily. Takes inconsistently   Yes [provider]  famotidine  (PEPCID ) 20 MG tablet TAKE ONE TABLET EVERY DAY AS NEEDED FOR HEARTBURN OR INDIGESTION 07/25/22  Yes Tower, Marne A, MD  Flaxseed, Linseed, (FLAX SEED OIL PO) Take by mouth. 2 a day   Yes [provider]  fluticasone  (FLONASE ) 50 MCG/ACT nasal spray SPRAY TWICE INTO EACH NOSTRIL ONCE DAILY 04/19/22  Yes Tower, Laine LABOR, MD  levothyroxine  (SYNTHROID ) 100 MCG tablet Take 1 tablet (100 mcg total) by mouth daily before breakfast. 10/29/23  Yes Tower, Laine LABOR, MD  Multiple Vitamin (MULTIVITAMIN) capsule Take 1 capsule by mouth daily.   Yes [provider]  nitrofurantoin , macrocrystal-monohydrate, (MACROBID ) 100 MG capsule Take 1 capsule (100 mg total) by mouth 2 (two) times daily. 12/08/23  Yes Kynsley Whitehouse, Shelba SAUNDERS, NP  Propylene Glycol (SYSTANE COMPLETE OP) Apply to eye.   Yes [provider]  amoxicillin -clavulanate  (AUGMENTIN ) 875-125 MG tablet Take 1 tablet by mouth 2 (two) times daily. 11/15/23   Tower, Laine LABOR, MD  carboxymethylcellulose (REFRESH PLUS) 0.5 % SOLN 1 drop 3 (three) times daily as needed.    [provider]  diclofenac  sodium (VOLTAREN ) 1 % GEL Apply 2 g topically 4 (four) times daily. Rub into affected area of foot 2 to 4 times daily Patient taking differently: Apply 2 g topically as needed. Rub into affected area of foot 2 to 4 times daily 12/21/14   Gershon Donnice SAUNDERS, DPM  FLUoxetine  (PROZAC ) 10 MG capsule Take 1 capsule (10 mg total) by mouth daily. 10/28/23   Tower, Laine LABOR, MD    Family History Family History  Problem Relation Age of Onset   Atrial fibrillation Mother    Coronary artery disease Mother    Congenital heart disease Mother    Heart attack Father 101  Hypertension Father    Diabetes Maternal Aunt    Cancer Maternal Aunt        leukemia   Diabetes Maternal Uncle    Cancer Maternal Uncle        colon   Diabetes Other        Grandmother   Coronary artery disease Other        Grandmother   Uterine cancer Other        Grandmother   Leukemia Other        Aunt    Social History Social History   Tobacco Use   Smoking status: Never   Smokeless tobacco: Never  Vaping Use   Vaping status: Never Used  Substance Use Topics   Alcohol use: No    Alcohol/week: 0.0 standard drinks of alcohol   Drug use: No     Allergies   Codeine, Fentanyl , Hydromorphone , Morphine, Ciprofloxacin , Tape, and Prednisone   Review of Systems Review of Systems   Physical Exam Triage Vital Signs ED Triage Vitals  Encounter Vitals Group     BP 12/08/23 0927 127/73     Systolic BP Percentile --      Diastolic BP Percentile --      Pulse Rate 12/08/23 0927 80     Resp 12/08/23 0927 18     Temp 12/08/23 0927 98.4 F (36.9 C)     Temp Source 12/08/23 0927 Oral     SpO2 12/08/23 0927 100 %     Weight --      Height --      Head Circumference --      Peak Flow  --      Pain Score 12/08/23 0930 0     Pain Loc --      Pain Education --      Exclude from Growth Chart --    No data found.  Updated Vital Signs BP 127/73   Pulse 80   Temp 98.4 F (36.9 C) (Oral)   Resp 18   LMP 12/03/1988   SpO2 100%   Visual Acuity Right Eye Distance:   Left Eye Distance:   Bilateral Distance:    Right Eye Near:   Left Eye Near:    Bilateral Near:     Physical Exam Constitutional:      Appearance: Normal appearance.  Eyes:     Extraocular Movements: Extraocular movements intact.  Pulmonary:     Effort: Pulmonary effort is normal.  Abdominal:     Tenderness: There is no abdominal tenderness. There is no right CVA tenderness, left CVA tenderness or guarding.  Neurological:     Mental Status: She is alert.      UC Treatments / Results  Labs (all labs ordered are listed, but only abnormal results are displayed) Labs Reviewed  POCT URINALYSIS DIP (MANUAL ENTRY) - Abnormal; Notable for the following components:      Result Value   Clarity, UA hazy (*)    Blood, UA moderate (*)    pH, UA 8.5 (*)    Leukocytes, UA Small (1+) (*)    All other components within normal limits  URINE CULTURE    EKG   Radiology No results found.  Procedures Procedures (including critical care time)  Medications Ordered in UC Medications - No data to display  Initial Impression / Assessment and Plan / UC Course  I have reviewed the triage vital signs and the nursing notes.  Pertinent labs & imaging results that were  available during my care of the patient were reviewed by me and considered in my medical decision making (see chart for details).  Dysuria  Urinalysis shows leukocytes, negative for nitrates, sent for culture, initiate antibiotic if she is symptomatic, has taken Macrobid  in the past with success therefore prescribed today supportive care with follow-up with her this persist worsen or recur Final Clinical Impressions(s) / UC Diagnoses    Final diagnoses:  Dysuria     Discharge Instructions      Your urinalysis shows Teriyah Purington blood cells but does not currently show bacteria, your urine will be sent to the lab to determine exactly which bacteria is present, if any changes need to be made to your medications you will be notified  Begin use of her bed every morning and every evening for 5 days  You may use over-the-counter pyridium  to help minimize your symptoms until antibiotic removes bacteria, this medication will turn your urine orange  Increase your fluid intake through use of water  As always practice good hygiene, wiping front to back and avoidance of scented vaginal products to prevent further irritation  If symptoms continue to persist after use of medication or recur please follow-up with urgent care or your primary doctor as needed    ED Prescriptions     Medication Sig Dispense Auth. Provider   nitrofurantoin , macrocrystal-monohydrate, (MACROBID ) 100 MG capsule Take 1 capsule (100 mg total) by mouth 2 (two) times daily. 10 capsule Torianne Laflam R, NP      PDMP not reviewed this encounter.   Teresa Shelba SAUNDERS, NP 12/08/23 1003

## 2023-12-09 ENCOUNTER — Telehealth: Payer: Self-pay

## 2023-12-09 NOTE — Telephone Encounter (Signed)
 Per chart review tab pt was seen [facility]  on 12/08/23. Sending note to DrTower

## 2023-12-09 NOTE — Telephone Encounter (Signed)
 Looks like she is on macrobid and culture is pending Thanks

## 2023-12-09 NOTE — Telephone Encounter (Signed)
 Gabrielle Webb

## 2023-12-10 ENCOUNTER — Ambulatory Visit: Payer: PPO | Admitting: Family Medicine

## 2023-12-10 LAB — URINE CULTURE: Culture: 40000 — AB

## 2023-12-16 ENCOUNTER — Telehealth: Payer: Self-pay | Admitting: *Deleted

## 2023-12-16 NOTE — Telephone Encounter (Signed)
 Pt notified of Dr. Royden Purl comments and verbalized understanding. Pt said she feels a lot better and her sxs have resolved

## 2023-12-16 NOTE — Telephone Encounter (Signed)
 If she feels better and urinary symptoms are gone (including blood in urine) you do not need to re schedule  The urine culture was sensitive to macrobid which she was given

## 2023-12-16 NOTE — Telephone Encounter (Signed)
 Copied from CRM [id number]. Topic: Appointments - Appointment Info/Confirmation >> Dec 16, 2023 10:16 AM Rosina BIRCH wrote: Patient/patient representative is calling for information regarding an appointment. Pt called wanted to know if the provider want her to reschedule her appointment she missed on 1/7. Pt stated she is doing better

## 2023-12-18 ENCOUNTER — Other Ambulatory Visit: Payer: Self-pay | Admitting: Family Medicine

## 2023-12-18 ENCOUNTER — Ambulatory Visit: Payer: PPO | Admitting: Internal Medicine

## 2023-12-18 ENCOUNTER — Ambulatory Visit: Payer: Self-pay | Admitting: Family Medicine

## 2023-12-18 ENCOUNTER — Encounter: Payer: Self-pay | Admitting: Internal Medicine

## 2023-12-18 VITALS — BP 108/70 | Temp 98.0°F | Ht 63.0 in | Wt 134.0 lb

## 2023-12-18 DIAGNOSIS — R300 Dysuria: Secondary | ICD-10-CM

## 2023-12-18 DIAGNOSIS — N3000 Acute cystitis without hematuria: Secondary | ICD-10-CM | POA: Insufficient documentation

## 2023-12-18 LAB — POC URINALSYSI DIPSTICK (AUTOMATED)
Bilirubin, UA: NEGATIVE
Glucose, UA: NEGATIVE
Ketones, UA: NEGATIVE
Nitrite, UA: NEGATIVE
Protein, UA: POSITIVE — AB
Spec Grav, UA: 1.025 (ref 1.010–1.025)
Urobilinogen, UA: 0.2 U/dL
pH, UA: 5.5 (ref 5.0–8.0)

## 2023-12-18 MED ORDER — AMOXICILLIN-POT CLAVULANATE 875-125 MG PO TABS: 1.0000 | ORAL_TABLET | Freq: Two times a day (BID) | ORAL | 0 refills | Status: DC

## 2023-12-18 NOTE — Progress Notes (Signed)
 Subjective:    Patient ID: Gabrielle Webb, female    DOB: [birth year], 83 y.o.   MRN: [id number]  HPI Here due to recurrent urine symptoms  Was seen 10 days ago---on a SUnday Saw blood in the urine--went to urgent care Some dysuria and got "cold chills" while voiding Got macrobid  100 bid x 5 days Symptoms were completely better Has been drinking cranberry juice since then  2 nights ago had big glass of buttermilk Yesterday --symptoms restarted Her typical dysuria (sensation) restarted yesterday No fever No blood this time  No other Rx  Current Outpatient Medications on File Prior to Visit  Medication Sig Dispense Refill   atorvastatin  (LIPITOR) 10 MG tablet TAKE 1 TABLET BY MOUTH DAILY 90 tablet 0   BIOTIN PO Take by mouth daily.     CALCIUM  PO Take 1,500 mg by mouth daily.     carboxymethylcellulose (REFRESH PLUS) 0.5 % SOLN 1 drop 3 (three) times daily as needed.     chlorthalidone  (HYGROTON ) 25 MG tablet TAKE TWO TABLETS EVERY DAY AS NEEDED FORSWELLING 180 tablet 0   Cholecalciferol (VITAMIN D ) 2000 UNITS CAPS Take 1 capsule by mouth daily.     Cyanocobalamin  (B-12 PO) Take 2,500 mcg by mouth daily.     denosumab  (PROLIA ) 60 MG/ML SOSY injection Inject 60 mg into the skin every 6 (six) months. 1 mL 0   diclofenac  sodium (VOLTAREN ) 1 % GEL Apply 2 g topically 4 (four) times daily. Rub into affected area of foot 2 to 4 times daily (Patient taking differently: Apply 2 g topically as needed. Rub into affected area of foot 2 to 4 times daily) 100 g 2   dicyclomine  (BENTYL ) 10 MG capsule TAKE 1 CAPSULE BY MOUTH TWICE DAILY FOR SPASMS 180 capsule 2   escitalopram  (LEXAPRO ) 5 MG tablet Take 5 mg by mouth daily. Takes inconsistently     famotidine  (PEPCID ) 20 MG tablet TAKE ONE TABLET EVERY DAY AS NEEDED FOR HEARTBURN OR INDIGESTION 90 tablet 3   Flaxseed, Linseed, (FLAX SEED OIL PO) Take by mouth. 2 a day     fluticasone  (FLONASE ) 50 MCG/ACT nasal spray SPRAY TWICE INTO EACH  NOSTRIL ONCE DAILY 18 g 2   levothyroxine  (SYNTHROID ) 100 MCG tablet Take 1 tablet (100 mcg total) by mouth daily before breakfast. 90 tablet 0   Multiple Vitamin (MULTIVITAMIN) capsule Take 1 capsule by mouth daily.     Propylene Glycol (SYSTANE COMPLETE OP) Apply to eye.     Current Facility-Administered Medications on File Prior to Visit  Medication Dose Route Frequency Provider Last Rate Last Admin   denosumab  (PROLIA ) injection 60 mg  60 mg Subcutaneous Once Tower, Manley Seeds, MD       [START ON 06/01/2024] denosumab  (PROLIA ) injection 60 mg  60 mg Subcutaneous Once Tower, Manley Seeds, MD        Allergies  Allergen Reactions   Codeine Nausea And Vomiting   Fentanyl  Nausea And Vomiting   Hydromorphone  Nausea And Vomiting        Morphine Nausea And Vomiting   Ciprofloxacin      Intolerant- tingling    Tape     Surgical tape   Prednisone Palpitations    Rapid hear beat    Past Medical History:  Diagnosis Date   Allergy    Anxiety    Breast cancer (HCC) 7/12   Right   Cellulitis    Diverticulosis of colon    GERD (gastroesophageal reflux disease)  Headache    sinus   History of hiatal hernia    Hx of radiation therapy 06/20/11 - 07/11/11   right breast   Hypothyroid    Osteoporosis    Personal history of radiation therapy 2012   Right Breast Cancer   PONV (postoperative nausea and vomiting)    Skin cancer    basal and squamous cell   Vertigo     Past Surgical History:  Procedure Laterality Date   ABDOMINAL HYSTERECTOMY N/A 11/08/2016   Procedure: HYSTERECTOMY TOTAL  ABDOMINAL;  Surgeon: Thurman Flores, MD;  Location: [facility];  Service: Gynecology;  Laterality: N/A;   APPENDECTOMY  07/2007   back sugery  1970   BREAST BIOPSY Left    Benign   BREAST EXCISIONAL BIOPSY Left 1983   BREAST EXCISIONAL BIOPSY Left 1983   BREAST LUMPECTOMY Right 05/16/2011   COLONOSCOPY     COLONOSCOPY WITH PROPOFOL  N/A 08/27/2018   Procedure: COLONOSCOPY WITH PROPOFOL ;  Surgeon:  Cassie Click, MD;  Location: [facility] ENDOSCOPY;  Service: Endoscopy;  Laterality: N/A;   COLONOSCOPY WITH PROPOFOL  N/A 08/16/2021   Procedure: COLONOSCOPY WITH PROPOFOL ;  Surgeon: Marshall Skeeter, MD;  Location: [facility] ENDOSCOPY;  Service: Endoscopy;  Laterality: N/A;   DIAGNOSTIC LAPAROSCOPY     DILATION AND CURETTAGE OF UTERUS N/A 05/31/2014   Procedure: DILATATION AND CURETTAGE WITH ULTRASOUND GUIDANCE;  Surgeon: Martine Sleek, MD;  Location: [facility];  Service: Gynecology;  Laterality: N/A;   ESOPHAGOGASTRODUODENOSCOPY  04/20/2003   KNEE SURGERY  1962   MASTECTOMY     right eye surgery     SALPINGOOPHORECTOMY Bilateral 11/08/2016   Procedure: BILATERAL SALPINGO OOPHORECTOMY;  Surgeon: Thurman Flores, MD;  Location: [facility];  Service: Gynecology;  Laterality: Bilateral;   UPPER GI ENDOSCOPY      Family History  Problem Relation Age of Onset   Atrial fibrillation Mother    Coronary artery disease Mother    Congenital heart disease Mother    Heart attack Father 42   Hypertension Father    Diabetes Maternal Aunt    Cancer Maternal Aunt        leukemia   Diabetes Maternal Uncle    Cancer Maternal Uncle        colon   Diabetes Other        Grandmother   Coronary artery disease Other        Grandmother   Uterine cancer Other        Grandmother   Leukemia Other        Aunt    Social History   Socioeconomic History   Marital status: Widowed    Spouse name: Not on file   Number of children: 1   Years of education: Not on file   Highest education level: Not on file  Occupational History   Occupation: insurance agency  Tobacco Use   Smoking status: Never   Smokeless tobacco: Never  Vaping Use   Vaping status: Never Used  Substance and Sexual Activity   Alcohol use: No    Alcohol/week: 0.0 standard drinks of alcohol   Drug use: No   Sexual activity: Not on file  Other Topics Concern   Not on file  Social History Narrative   Exercises on [employer] elliptical    Social Drivers of Health   Financial Resource Strain: Low Risk  (07/02/2023)   Overall Financial Resource Strain (CARDIA)    Difficulty of Paying Living Expenses: Not hard at all  Food  Insecurity: No Food Insecurity (07/02/2023)   Hunger Vital Sign    Worried About Running Out of Food in the Last Year: Never true    Ran Out of Food in the Last Year: Never true  Transportation Needs: No Transportation Needs (07/02/2023)   PRAPARE - Administrator, Civil Service (Medical): No    Lack of Transportation (Non-Medical): No  Physical Activity: Patient Unable To Answer (07/02/2023)   Exercise Vital Sign    Days of Exercise per Week: Patient unable to answer    Minutes of Exercise per Session: Patient unable to answer  Stress: No Stress Concern Present (07/02/2023)   [employer] of Occupational Health - Occupational Stress Questionnaire    Feeling of Stress : Not at all  Social Connections: Moderately Isolated (07/02/2023)   Social Connection and Isolation Panel [NHANES]    Frequency of Communication with Friends and Family: More than three times a week    Frequency of Social Gatherings with Friends and Family: More than three times a week    Attends Religious Services: More than 4 times per year    Active Member of [employer] or Organizations: No    Attends Banker Meetings: Never    Marital Status: Widowed  Intimate Partner Violence: Not At Risk (07/02/2023)   Humiliation, Afraid, Rape, and Kick questionnaire    Fear of Current or Ex-Partner: No    Emotionally Abused: No    Physically Abused: No    Sexually Abused: No   Review of Systems No back pain No N/V Does have suprapubic pressure    Objective:   Physical Exam Constitutional:      Appearance: Normal appearance.  Abdominal:     Palpations: Abdomen is soft.     Tenderness: There is no right CVA tenderness, left CVA tenderness, guarding or rebound.     Comments: Mild suprapubic tenderness   Neurological:     Mental Status: She is alert.            Assessment & Plan:

## 2023-12-18 NOTE — Telephone Encounter (Signed)
 Copied from CRM [id number]. Topic: Clinical - Medication Refill >> Dec 18, 2023  9:19 AM Jennings Mohr D wrote: Most Recent Primary Care Visit:  Provider: Katheleen Palmer  Department: [facility]  Visit Type: NURSE VISIT  Date: 12/03/2023  Medication: nitrofurantoin , macrocrystal-monohydrate, (MACROBID ) 100 MG capsule  Has the patient contacted their pharmacy? No (Agent: If no, request that the patient contact the pharmacy for the refill. If patient does not wish to contact the pharmacy document the reason why and proceed with request.) (Agent: If yes, when and what did the pharmacy advise?)  Is this the correct pharmacy for this prescription? Yes If no, delete pharmacy and type the correct one.  This is the patient's preferred pharmacy:  [facility] - [place], [place] - [address] [place] [address] Phone: [phone] Fax: [phone]    Has the prescription been filled recently? Yes  Is the patient out of the medication? Yes  Has the patient been seen for an appointment in the last year OR does the patient have an upcoming appointment? Yes  Can we respond through MyChart? Yes  Agent: Please be advised that Rx refills may take up to 3 business days. We ask that you follow-up with your pharmacy.

## 2023-12-18 NOTE — Addendum Note (Signed)
 Addended by: Franne Ivory on: 12/18/2023 01:02 PM   Modules accepted: Orders

## 2023-12-18 NOTE — Telephone Encounter (Signed)
 Will assess at the OV today

## 2023-12-18 NOTE — Telephone Encounter (Signed)
  Chief Complaint: UTI not fully gone Symptoms: not able to empty bladder, only going a few drops, urgency Frequency: yesterday Pertinent Negatives: Patient denies fever, denies pain, denies odor, denies blood  Disposition: [] ED /[] Urgent Care (no appt availability in office) / [x] Appointment(In office/virtual)/ []  [facility]/ [] Home Care/ [] Refused Recommended Disposition /[] [facility]/ []  Follow-up with PCP  Additional Notes: Pt recently dx with UTI and took Macrobid  as prescribed. Pt states that she was relieved of all s/s, however, she ate cornbread and drank buttermilk and now she is having urgency and only minimal amount is voided at each visit to the restroom. Pt sched with MD in PCP office.     Copied from CRM [id number]. Topic: Clinical - Medication Refill >> Dec 18, 2023  9:19 AM Jasmine D wrote: Most Recent Primary Care Visit:  Provider: Katheleen Palmer  Department: [facility]  Visit Type: NURSE VISIT  Date: 12/03/2023  Medication: nitrofurantoin , macrocrystal-monohydrate, (MACROBID ) 100 MG capsule  Has the patient contacted their pharmacy? No (Agent: If no, request that the patient contact the pharmacy for the refill. If patient does not wish to contact the pharmacy document the reason why and proceed with request.) (Agent: If yes, when and what did the pharmacy advise?)  Is this the correct pharmacy for this prescription? Yes If no, delete pharmacy and type the correct one.  This is the patient's preferred pharmacy:  [facility] - [place], [place] - [address] [place] [address] Phone: [phone] Fax: [phone]    Has the prescription been filled recently? Yes  Is the patient out of the medication? Yes  Has the patient been seen for an appointment in the last year OR does the patient have an upcoming appointment? Yes  Can we respond through MyChart? Yes  Agent: Please be advised that Rx refills may  take up to 3 business days. We ask that you follow-up with your pharmacy. Reason for Disposition . Urinating more frequently than usual (i.e., frequency)  Answer Assessment - Initial Assessment Questions 1. SYMPTOM: "What's the main symptom you're concerned about?" (e.g., frequency, incontinence)     urgency 2. ONSET: "When did the  urgency  start?"     yesterday 3. PAIN: "Is there any pain?" If Yes, ask: "How bad is it?" (Scale: 1-10; mild, moderate, severe)     Denies, feels like I have goose bumps going up my back 4. CAUSE: "What do you think is causing the symptoms?"     UTI, just diagnosed on 12/08/23, took  5. OTHER SYMPTOMS: "Do you have any other symptoms?" (e.g., blood in urine, fever, flank pain, pain with urination)     denies  Protocols used: Urinary Symptoms-A-AH

## 2023-12-18 NOTE — Telephone Encounter (Signed)
 Refill not appropriate, pt is seeing a provider today for sxs, if provider feels pt needs abx they will prescribe it at appt

## 2023-12-18 NOTE — Assessment & Plan Note (Addendum)
 Culture showed 40K pansensitive E coli. 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

## 2023-12-20 ENCOUNTER — Encounter: Payer: Self-pay | Admitting: Internal Medicine

## 2023-12-20 LAB — URINE CULTURE
MICRO NUMBER:: 15958933
SPECIMEN QUALITY:: ADEQUATE

## 2024-01-06 ENCOUNTER — Telehealth: Payer: Self-pay | Admitting: *Deleted

## 2024-01-06 NOTE — Telephone Encounter (Signed)
Pt notified of Dr. Tower's recommendations and verbalized understanding  

## 2024-01-06 NOTE — Telephone Encounter (Signed)
Copied from CRM [id number]. Topic: Clinical - Medication Question >> Jan 06, 2024 10:06 AM Lennart Pall wrote: Reason for CRM: Patient wanting to know which shots she needs to take for 2025, if someone could please reach out to her.

## 2024-01-06 NOTE — Progress Notes (Deleted)
 Cardiology Office Note  Date:  01/06/2024   ID:  [name], [name] Apr 13, 1941, MRN [id number]  PCP:  Randeen Laine LABOR, MD   No chief complaint on file.   HPI:  Gabrielle Webb is a pleasant 83 year old woman with history of Anxiety, uses Xanax  as needed Nonsmoker Family history of coronary artery disease History of breast cancer on the right, XRT on the right, lumpectomy Previously seen 2018 for chest pain and shortness of breath symptoms CT calcium  scoring June 2023, score of 0 Who presents for new patient evaluation for dental x-ray showing calcification right side of neck  Last seen by myself in clinic June 2023  Carotid ultrasound July 2023   Recently seen by the dentist, they did x-ray, finding of small punctate region of calcification left carotid possibly She is concerned  2018 calcium  score reviewed, nervous that it may have changed Coronary calcium  score of 0. This was 0 percentile for age and sex matched control.  Denies significant chest pain, palpitations, no near-syncope or syncope, No regular exercise program but denies shortness of breath on exertion Tolerating Lipitor   EKG personally reviewed by myself on todays visit Shows normal sinus rhythm with short run sinus tachycardia/atrial tachycardia rate 106 bpm, PACs  Lab work reviewed with her in detail Total chol 210, LDL 128   PMH:   has a past medical history of Allergy, Anxiety, Breast cancer (HCC) (7/12), Cellulitis, Diverticulosis of colon, GERD (gastroesophageal reflux disease), Headache, History of hiatal hernia, radiation therapy (06/20/11 - 07/11/11), Hypothyroid, Osteoporosis, Personal history of radiation therapy (2012), PONV (postoperative nausea and vomiting), Skin cancer, and Vertigo.  PSH:    Past Surgical History:  Procedure Laterality Date   ABDOMINAL HYSTERECTOMY N/A 11/08/2016   Procedure: HYSTERECTOMY TOTAL  ABDOMINAL;  Surgeon: [name], MD;  Location: [facility];  Service: Gynecology;   Laterality: N/A;   APPENDECTOMY  07/2007   back sugery  1970   BREAST BIOPSY Left    Benign   BREAST EXCISIONAL BIOPSY Left 1983   BREAST EXCISIONAL BIOPSY Left 1983   BREAST LUMPECTOMY Right 05/16/2011   COLONOSCOPY     COLONOSCOPY WITH PROPOFOL  N/A 08/27/2018   Procedure: COLONOSCOPY WITH PROPOFOL ;  Surgeon: [name], MD;  Location: [facility];  Service: Endoscopy;  Laterality: N/A;   COLONOSCOPY WITH PROPOFOL  N/A 08/16/2021   Procedure: COLONOSCOPY WITH PROPOFOL ;  Surgeon: [name], MD;  Location: [facility] ENDOSCOPY;  Service: Endoscopy;  Laterality: N/A;   DIAGNOSTIC LAPAROSCOPY     DILATION AND CURETTAGE OF UTERUS N/A 05/31/2014   Procedure: DILATATION AND CURETTAGE WITH ULTRASOUND GUIDANCE;  Surgeon: Rosaline LITTIE Cobble, MD;  Location: [facility];  Service: Gynecology;  Laterality: N/A;   ESOPHAGOGASTRODUODENOSCOPY  04/20/2003   KNEE SURGERY  1962   MASTECTOMY     right eye surgery     SALPINGOOPHORECTOMY Bilateral 11/08/2016   Procedure: BILATERAL SALPINGO OOPHORECTOMY;  Surgeon: [name], MD;  Location: [facility];  Service: Gynecology;  Laterality: Bilateral;   UPPER GI ENDOSCOPY      Current Outpatient Medications  Medication Sig Dispense Refill   amoxicillin -clavulanate (AUGMENTIN ) 875-125 MG tablet Take 1 tablet by mouth 2 (two) times daily. 14 tablet 0   atorvastatin  (LIPITOR) 10 MG tablet TAKE 1 TABLET BY MOUTH DAILY 90 tablet 0   BIOTIN PO Take by mouth daily.     CALCIUM  PO Take 1,500 mg by mouth daily.     carboxymethylcellulose (REFRESH PLUS) 0.5 % SOLN 1 drop 3 (three) times  daily as needed.     chlorthalidone  (HYGROTON ) 25 MG tablet TAKE TWO TABLETS EVERY DAY AS NEEDED FORSWELLING 180 tablet 0   Cholecalciferol (VITAMIN D ) 2000 UNITS CAPS Take 1 capsule by mouth daily.     Cyanocobalamin  (B-12 PO) Take 2,500 mcg by mouth daily.     denosumab  (PROLIA ) 60 MG/ML SOSY injection Inject 60 mg into the skin every 6 (six) months. 1 mL 0   diclofenac   sodium (VOLTAREN ) 1 % GEL Apply 2 g topically 4 (four) times daily. Rub into affected area of foot 2 to 4 times daily (Patient taking differently: Apply 2 g topically as needed. Rub into affected area of foot 2 to 4 times daily) 100 g 2   dicyclomine  (BENTYL ) 10 MG capsule TAKE 1 CAPSULE BY MOUTH TWICE DAILY FOR SPASMS 180 capsule 2   escitalopram  (LEXAPRO ) 5 MG tablet Take 5 mg by mouth daily. Takes inconsistently     famotidine  (PEPCID ) 20 MG tablet TAKE ONE TABLET EVERY DAY AS NEEDED FOR HEARTBURN OR INDIGESTION 90 tablet 3   Flaxseed, Linseed, (FLAX SEED OIL PO) Take by mouth. 2 a day     fluticasone  (FLONASE ) 50 MCG/ACT nasal spray SPRAY TWICE INTO EACH NOSTRIL ONCE DAILY 18 g 2   levothyroxine  (SYNTHROID ) 100 MCG tablet Take 1 tablet (100 mcg total) by mouth daily before breakfast. 90 tablet 0   Multiple Vitamin (MULTIVITAMIN) capsule Take 1 capsule by mouth daily.     Propylene Glycol (SYSTANE COMPLETE OP) Apply to eye.     Current Facility-Administered Medications  Medication Dose Route Frequency Provider Last Rate Last Admin   denosumab  (PROLIA ) injection 60 mg  60 mg Subcutaneous Once Tower, Laine LABOR, MD       [START ON 06/01/2024] denosumab  (PROLIA ) injection 60 mg  60 mg Subcutaneous Once Tower, Laine A, MD         Allergies:   Codeine, Fentanyl , Hydromorphone , Morphine, Ciprofloxacin , Tape, and Prednisone   Social History:  The patient  reports that she has never smoked. She has never used smokeless tobacco. She reports that she does not drink alcohol and does not use drugs.   Family History:   family history includes Atrial fibrillation in her mother; Cancer in her maternal aunt and maternal uncle; Congenital heart disease in her mother; Coronary artery disease in her mother and another family member; Diabetes in her maternal aunt, maternal uncle, and another family member; Heart attack (age of onset: 70) in her father; Hypertension in her father; Leukemia in an other family member;  Uterine cancer in an other family member.    Review of Systems: Review of Systems  Constitutional: Negative.   HENT: Negative.    Respiratory: Negative.    Cardiovascular: Negative.   Gastrointestinal: Negative.   Musculoskeletal: Negative.   Neurological: Negative.   Psychiatric/Behavioral:  The patient is nervous/anxious.   All other systems reviewed and are negative.    PHYSICAL EXAM: VS:  LMP 12/03/1988  , BMI There is no height or weight on file to calculate BMI. GEN: Well nourished, well developed, in no acute distress HEENT: normal Neck: no JVD, carotid bruits, or masses Cardiac: RRR; no murmurs, rubs, or gallops,no edema  Respiratory:  clear to auscultation bilaterally, normal work of breathing GI: soft, nontender, nondistended, + BS MS: no deformity or atrophy Skin: warm and dry, no rash Neuro:  Strength and sensation are intact Psych: euthymic mood, full affect   Recent Labs: 08/21/2023: ALT 10; Hemoglobin 12.6; Platelets 179.0 10/28/2023:  BUN 13; Creatinine, Ser 0.75; Potassium 4.2; Sodium 140; TSH 16.18    Lipid Panel Lab Results  Component Value Date   CHOL 203 (H) 10/28/2023   HDL 59.60 10/28/2023   LDLCALC 118 (H) 10/28/2023   TRIG 128.0 10/28/2023      Wt Readings from Last 3 Encounters:  12/18/23 134 lb (60.8 kg)  10/28/23 132 lb 8 oz (60.1 kg)  10/02/23 134 lb 2 oz (60.8 kg)      ASSESSMENT AND PLAN:  Problem List Items Addressed This Visit   None   Carotid calcification Noted on dental x-ray on the left For further evaluation and reassurance for her we have ordered carotid ultrasound Likely focal punctate region without significant stenosis  Hyperlipidemia Numbers much improved on Lipitor, recommend she stay on the current dose She is concerned about progression of coronary calcification, requesting repeat CT coronary calcium  scoring Detailed that prior scoring a 2018 score was 0 and there likely has been minimal progression if  any At her request a repeat calcium  score has been ordered  Stress/adjustment disorder Recently lost her mother 2 months ago, still trouble adjusting  Palpitations/tachycardia Short run atrial tachycardia rate around 106 bpm noted on EKG, short-lived several seconds then self terminated No significant room on blood pressure to add beta-blockers     Total encounter time more than 60 minutes  Greater than 50% was spent in counseling and coordination of care with the patient    Signed, Velinda Lunger, M.D., Ph.D. [facility] [place], [place] [phone]

## 2024-01-06 NOTE — Telephone Encounter (Unsigned)
Copied from CRM [id number]. Topic: Clinical - Medication Question >> Jan 06, 2024 10:06 AM Lennart Pall wrote: Reason for CRM: Patient wanting to know which shots she needs to take for 2025, if someone could please reach out to her.

## 2024-01-06 NOTE — Telephone Encounter (Signed)
I recommend a flu shot if she has not had one this season  Covid shot if she has not had one in the past year   Is utd on tetanus and pna and shingrix vaccines    Rsv is an option for folks over 65 -she can ask about that at pharmacy

## 2024-01-07 ENCOUNTER — Ambulatory Visit: Payer: PPO | Admitting: Cardiovascular Disease

## 2024-01-07 DIAGNOSIS — R5382 Chronic fatigue, unspecified: Secondary | ICD-10-CM

## 2024-01-07 DIAGNOSIS — I6522 Occlusion and stenosis of left carotid artery: Secondary | ICD-10-CM

## 2024-01-07 DIAGNOSIS — E782 Mixed hyperlipidemia: Secondary | ICD-10-CM

## 2024-01-07 DIAGNOSIS — I739 Peripheral vascular disease, unspecified: Secondary | ICD-10-CM

## 2024-01-07 DIAGNOSIS — F419 Anxiety disorder, unspecified: Secondary | ICD-10-CM

## 2024-01-09 ENCOUNTER — Telehealth: Payer: Self-pay | Admitting: Family Medicine

## 2024-01-09 ENCOUNTER — Other Ambulatory Visit: Payer: Self-pay | Admitting: Family Medicine

## 2024-01-09 DIAGNOSIS — M818 Other osteoporosis without current pathological fracture: Secondary | ICD-10-CM

## 2024-01-09 DIAGNOSIS — Z1231 Encounter for screening mammogram for malignant neoplasm of breast: Secondary | ICD-10-CM

## 2024-01-09 DIAGNOSIS — E2839 Other primary ovarian failure: Secondary | ICD-10-CM

## 2024-01-09 NOTE — Telephone Encounter (Signed)
 I don't think she is due till aug 2025  Do you know if they will take an order this early?

## 2024-01-09 NOTE — Telephone Encounter (Signed)
 Copied from CRM [id number]. Topic: Clinical - Request for Lab/Test Order >> Jan 09, 2024  8:25 AM [name] ORN wrote: Reason for CRM: Patient is wanting Dr. Randeen to send a request over to [facility] for her to have a bone density test. Patient states she spoke with them & they told her that she needed to have a request from PCP sent.

## 2024-01-10 NOTE — Telephone Encounter (Signed)
 Pt already has her mammogram scheduled for Aug and wants to make sure she can have the DEXA at the same time. Please place order, and pt will call to get it scheduled

## 2024-01-12 NOTE — Addendum Note (Signed)
 Addended by: Deri Fleet A on: 01/12/2024 03:40 PM   Modules accepted: Orders

## 2024-01-12 NOTE — Telephone Encounter (Signed)
 The order is in  She can call to schedule if they are scheduling that far out

## 2024-01-13 NOTE — Telephone Encounter (Signed)
 Imaging [facility]tr called pt directly and scheduled her DEXA along with her mammogram

## 2024-01-14 ENCOUNTER — Other Ambulatory Visit: Payer: Self-pay | Admitting: Family Medicine

## 2024-01-14 NOTE — Telephone Encounter (Signed)
Patient called in regarding her medication she is waiting for dr tower to send over the prescription for her medication

## 2024-01-14 NOTE — Telephone Encounter (Signed)
This abx was prescribed by Dr. Alphonsus Sias for a recent UTI.  Called pt and advised we don't refill abx. I asked pt why she wanted an antibiotic pt said she knows she has a sinus infection and wants an antibiotic. I advise pt we would need to see her for an appt to determine in abx are appropriate. Pt declined an appt she said she will just keep taking OTC meds for her sxs and nasal saline. Pt advised if she changes her mind and wants her sinus sxs evaluated to call us back.   Med declined and FYI to PCP

## 2024-02-04 ENCOUNTER — Telehealth: Payer: Self-pay | Admitting: Family Medicine

## 2024-02-04 ENCOUNTER — Telehealth: Payer: Self-pay | Admitting: *Deleted

## 2024-02-04 DIAGNOSIS — E039 Hypothyroidism, unspecified: Secondary | ICD-10-CM

## 2024-02-04 DIAGNOSIS — E7800 Pure hypercholesterolemia, unspecified: Secondary | ICD-10-CM

## 2024-02-04 NOTE — Telephone Encounter (Signed)
-----   Message from Vincenza Hews sent at 01/27/2024  9:25 AM EST ----- Regarding: Labs Wed 02/05/24 Hello,   Patient has a lab appointment on Wednesday 02/05/24. Can we get lab orders please.   Thanks

## 2024-02-04 NOTE — Telephone Encounter (Signed)
 Please clarify - what meds is she referring to ?

## 2024-02-04 NOTE — Telephone Encounter (Signed)
 Copied from CRM [id number]. Topic: General - Other >> Feb 04, 2024  9:28 AM Rodman Pickle T wrote: Reason for CRM: patient appt has been changed to Friday she wants to make sure its ok to continue to still not take her meds

## 2024-02-04 NOTE — Telephone Encounter (Signed)
 Thanks- yes- do NOT stop meds

## 2024-02-04 NOTE — Telephone Encounter (Signed)
 Pt said she was advise to stop taking her meds a few days before her lab appt to check her lipids so it's "more accurate" I advise pt that is not correct and not to stop taking meds and she can also drink water and black coffee before labs.  FYI to PCP (also let management know to f/u with call)

## 2024-02-05 ENCOUNTER — Other Ambulatory Visit: Payer: PPO

## 2024-02-07 ENCOUNTER — Other Ambulatory Visit

## 2024-02-07 DIAGNOSIS — E039 Hypothyroidism, unspecified: Secondary | ICD-10-CM | POA: Diagnosis not present

## 2024-02-07 DIAGNOSIS — E7800 Pure hypercholesterolemia, unspecified: Secondary | ICD-10-CM

## 2024-02-07 LAB — LIPID PANEL
Cholesterol: 186 mg/dL (ref 0–200)
HDL: 71.1 mg/dL (ref >39.00)
LDL Cholesterol: 99 mg/dL (ref 0–99)
NonHDL: 115.01
Total CHOL/HDL Ratio: 3
Triglycerides: 78 mg/dL (ref 0.0–149.0)
VLDL: 15.6 mg/dL (ref 0.0–40.0)

## 2024-02-07 LAB — COMPREHENSIVE METABOLIC PANEL
ALT: 9 U/L (ref 0–35)
AST: 18 U/L (ref 0–37)
Albumin: 4.3 g/dL (ref 3.5–5.2)
Alkaline Phosphatase: 51 U/L (ref 39–117)
BUN: 18 mg/dL (ref 6–23)
CO2: 27 meq/L (ref 19–32)
Calcium: 9.2 mg/dL (ref 8.4–10.5)
Chloride: 105 meq/L (ref 96–112)
Creatinine, Ser: 0.69 mg/dL (ref 0.40–1.20)
GFR: 81.79 mL/min (ref >60.00)
Glucose, Bld: 83 mg/dL (ref 70–99)
Potassium: 4 meq/L (ref 3.5–5.1)
Sodium: 143 meq/L (ref 135–145)
Total Bilirubin: 0.8 mg/dL (ref 0.2–1.2)
Total Protein: 6.8 g/dL (ref 6.0–8.3)

## 2024-02-07 LAB — TSH: TSH: 11.8 u[IU]/mL — ABNORMAL HIGH (ref 0.35–5.50)

## 2024-02-09 ENCOUNTER — Encounter: Payer: Self-pay | Admitting: Family Medicine

## 2024-02-09 NOTE — Addendum Note (Signed)
 Addended by: Roxy Manns A on: 02/09/2024 11:00 AM   Modules accepted: Orders

## 2024-02-10 ENCOUNTER — Telehealth: Payer: Self-pay | Admitting: Family Medicine

## 2024-02-10 ENCOUNTER — Other Ambulatory Visit: Payer: Self-pay

## 2024-02-10 DIAGNOSIS — Z1231 Encounter for screening mammogram for malignant neoplasm of breast: Secondary | ICD-10-CM

## 2024-02-10 DIAGNOSIS — E2839 Other primary ovarian failure: Secondary | ICD-10-CM

## 2024-02-10 DIAGNOSIS — M810 Age-related osteoporosis without current pathological fracture: Secondary | ICD-10-CM

## 2024-02-10 DIAGNOSIS — M818 Other osteoporosis without current pathological fracture: Secondary | ICD-10-CM

## 2024-02-10 MED ORDER — LEVOTHYROXINE SODIUM 112 MCG PO TABS: 112.0000 ug | ORAL_TABLET | Freq: Every day | ORAL | 1 refills | Status: DC

## 2024-02-10 NOTE — Telephone Encounter (Signed)
 Patient scheduled. Patient would also like a phone call to discuss some questions she have about bone density and mammogram. She stated that going to [facility] is a little far and wanted to know if Dr. Milinda Antis could send her somewhere in [place]. She can be reached at [phone]. Thank you!

## 2024-02-10 NOTE — Telephone Encounter (Signed)
 Is it possible for someone to change those orders to [facility] in [place] or do I have to re do both orders? Thanks

## 2024-02-10 NOTE — Telephone Encounter (Signed)
-----   Message from [facility] B sent at 02/10/2024 12:36 PM EDT ----- Patient reviewed results and Dr. Lucretia Roers comments. New Levothyroxine dose sent to pharmacy.   Admin- please scheduled 6 week lab appt to repeat thyroid test.

## 2024-02-10 NOTE — Progress Notes (Signed)
 Patient scheduled. Patient would also like a phone call to discuss some questions she have about bone density and mammogram. She stated that going to [facility] is a little far and wanted to know if Dr. Milinda Antis could send her somewhere in [place]. She can be reached at [phone]. Thank you!

## 2024-02-13 NOTE — Addendum Note (Signed)
 Addended by: Donnamarie Poag on: 02/13/2024 12:13 PM   Modules accepted: Orders

## 2024-02-13 NOTE — Telephone Encounter (Signed)
 Called patient new order placed as requested. Sending my chart with contact number for her to call office to set up appointment at her convenience.

## 2024-02-26 DIAGNOSIS — E039 Hypothyroidism, unspecified: Secondary | ICD-10-CM | POA: Diagnosis not present

## 2024-02-26 DIAGNOSIS — M810 Age-related osteoporosis without current pathological fracture: Secondary | ICD-10-CM | POA: Diagnosis not present

## 2024-03-03 ENCOUNTER — Other Ambulatory Visit: Payer: Self-pay | Admitting: Family Medicine

## 2024-03-12 ENCOUNTER — Other Ambulatory Visit: Payer: Self-pay | Admitting: Family Medicine

## 2024-03-16 NOTE — Progress Notes (Unsigned)
 Cardiology Office Note  Date:  03/17/2024   ID:  Gabrielle Webb, Gabrielle Webb [birth year], MRN [id number]  PCP:  Gabrielle Pimple, MD   Chief Complaint  Patient presents with   12 month follow up     "Doing well."     HPI:  Gabrielle Webb is a pleasant 83 year old woman with history of Anxiety, uses Xanax as needed Nonsmoker Family history of coronary artery disease History of breast cancer on the right, XRT on the right, lumpectomy Previously seen 2018 for chest pain and shortness of breath symptoms Coronary calcium score of 0 Who presents for follow-up for chest pain, x-ray showing calcification right side of neck  In follow-up today she reports doing well Stress, lost mother 6/23 Had difficult time with COVID in 2024, severe sinus infection, brain fog Has recovered since that time  Lifting heavy pot in her garden yesterday, appreciated pain left lower ribs, acute onset  Prior imaging x-ray through the dentist, finding of small punctate region of calcification left carotid possibly  2018 calcium score score of 0 2023 calcium score score of 0  Tolerating Lipitor MRI brain reviewed, no strokes  Lab work reviewed Total cholesterol 186 LDL 99   EKG personally reviewed by myself on todays visit EKG Interpretation Date/Time:  Tuesday March 17 2024 10:38:37 EDT Ventricular Rate:  65 PR Interval:  124 QRS Duration:  84 QT Interval:  408 QTC Calculation: 424 R Axis:   -25  Text Interpretation: Normal sinus rhythm Normal ECG When compared with ECG of 25-Jun-2023 22:15, Vent. rate has decreased BY  40 BPM Nonspecific T wave abnormality no longer evident in Lateral leads Confirmed by Julien Nordmann [phone]) on 03/17/2024 11:06:43 AM    Lab work reviewed with her in detail Total chol 210, LDL 128   PMH:   has a past medical history of Allergy, Anxiety, Breast cancer (HCC) (7/12), Cellulitis, Diverticulosis of colon, GERD (gastroesophageal reflux disease), Headache, History of hiatal  hernia, radiation therapy (06/20/11 - 07/11/11), Hypothyroid, Osteoporosis, Personal history of radiation therapy (2012), PONV (postoperative nausea and vomiting), Skin cancer, and Vertigo.  PSH:    Past Surgical History:  Procedure Laterality Date   ABDOMINAL HYSTERECTOMY N/A 11/08/2016   Procedure: HYSTERECTOMY TOTAL  ABDOMINAL;  Surgeon: Marcelle Overlie, MD;  Location: [facility];  Service: Gynecology;  Laterality: N/A;   APPENDECTOMY  07/2007   back sugery  1970   BREAST BIOPSY Left    Benign   BREAST EXCISIONAL BIOPSY Left 1983   BREAST EXCISIONAL BIOPSY Left 1983   BREAST LUMPECTOMY Right 05/16/2011   COLONOSCOPY     COLONOSCOPY WITH PROPOFOL N/A 08/27/2018   Procedure: COLONOSCOPY WITH PROPOFOL;  Surgeon: Scot Jun, MD;  Location: [facility] END[facility]COPY;  Service: Endoscopy;  Laterality: N/A;   COLONOSCOPY WITH PROPOFOL N/A 08/16/2021   Procedure: COLONOSCOPY WITH PROPOFOL;  Surgeon: Earline Mayotte, MD;  Location: [facility] ENDOSCOPY;  Service: Endoscopy;  Laterality: N/A;   DIAGNOSTIC LAPAROSCOPY     DILATION AND CURETTAGE OF UTERUS N/A 05/31/2014   Procedure: DILATATION AND CURETTAGE WITH ULTRASOUND GUIDANCE;  Surgeon: Jeani Hawking, MD;  Location: [facility];  Service: Gynecology;  Laterality: N/A;   ESOPHAGOGASTRODUODENOSCOPY  04/20/2003   KNEE SURGERY  1962   MASTECTOMY     right eye surgery     SALPINGOOPHORECTOMY Bilateral 11/08/2016   Procedure: BILATERAL SALPINGO OOPHORECTOMY;  Surgeon: Marcelle Overlie, MD;  Location: [facility];  Service: Gynecology;  Laterality: Bilateral;   UPPER GI ENDOSCOPY  Current Outpatient Medications  Medication Sig Dispense Refill   aspirin EC 81 MG tablet Take 81 mg by mouth daily.     atorvastatin (LIPITOR) 10 MG tablet TAKE 1 TABLET BY MOUTH DAILY 90 tablet 0   BIOTIN PO Take by mouth daily.     CALCIUM PO Take 1,500 mg by mouth daily.     carboxymethylcellulose (REFRESH PLUS) 0.5 % SOLN 1 drop 3 (three) times daily as needed.      chlorthalidone (HYGROTON) 25 MG tablet TAKE TWO TABLETS EVERY DAY AS NEEDED FORSWELLING 180 tablet 0   Cholecalciferol (VITAMIN D) 2000 UNITS CAPS Take 1 capsule by mouth daily.     Cyanocobalamin (B-12 PO) Take 2,500 mcg by mouth daily.     denosumab (PROLIA) 60 MG/ML SOSY injection Inject 60 mg into the skin every 6 (six) months. 1 mL 0   diclofenac sodium (VOLTAREN) 1 % GEL Apply 2 g topically 4 (four) times daily. Rub into affected area of foot 2 to 4 times daily (Patient taking differently: Apply 2 g topically as needed. Rub into affected area of foot 2 to 4 times daily) 100 g 2   dicyclomine (BENTYL) 10 MG capsule TAKE 1 CAPSULE BY MOUTH TWICE DAILY FOR SPASMS 180 capsule 2   escitalopram (LEXAPRO) 5 MG tablet Take 5 mg by mouth daily. Takes inconsistently     famotidine (PEPCID) 20 MG tablet TAKE ONE TABLET EVERY DAY AS NEEDED FOR HEARTBURN OR INDIGESTION 90 tablet 3   Flaxseed, Linseed, (FLAX SEED OIL PO) Take by mouth. 2 a day     fluticasone (FLONASE) 50 MCG/ACT nasal spray SPRAY TWICE INTO EACH NOSTRIL ONCE DAILY 16 g 3   levothyroxine (SYNTHROID) 112 MCG tablet Take 1 tablet (112 mcg total) by mouth daily before breakfast. 30 tablet 1   Multiple Vitamin (MULTIVITAMIN) capsule Take 1 capsule by mouth daily.     Propylene Glycol (SYSTANE COMPLETE OP) Apply to eye.     Current Facility-Administered Medications  Medication Dose Route Frequency Provider Last Rate Last Admin   denosumab (PROLIA) injection 60 mg  60 mg Subcutaneous Once Tower, Manley Seeds, MD       [START ON 06/01/2024] denosumab (PROLIA) injection 60 mg  60 mg Subcutaneous Once Tower, Marne A, MD        Allergies:   Codeine, Fentanyl, Hydromorphone, Morphine, Ciprofloxacin, Tape, and Prednisone   Social History:  The patient  reports that she has never smoked. She has never used smokeless tobacco. She reports that she does not drink alcohol and does not use drugs.   Family History:   family history includes Atrial  fibrillation in her mother; Cancer in her maternal aunt and maternal uncle; Congenital heart disease in her mother; Coronary artery disease in her mother and another family member; Diabetes in her maternal aunt, maternal uncle, and another family member; Heart attack (age of onset: 74) in her father; Hypertension in her father; Leukemia in an other family member; Uterine cancer in an other family member.   Review of Systems: Review of Systems  Constitutional: Negative.   HENT: Negative.    Respiratory: Negative.    Cardiovascular: Negative.   Gastrointestinal: Negative.   Musculoskeletal: Negative.   Neurological: Negative.   Psychiatric/Behavioral: Negative.    All other systems reviewed and are negative.   PHYSICAL EXAM: VS:  BP 120/70 (BP Location: Left Arm, Patient Position: Sitting, Cuff Size: Normal)   Pulse 65   Ht 5' 3.5" (1.613 m)   Wt  138 lb (62.6 kg)   LMP 12/03/1988   SpO2 97%   BMI 24.06 kg/m  , BMI Body mass index is 24.06 kg/m. Constitutional:  oriented to person, place, and time. No distress.  HENT:  Head: Normocephalic and atraumatic.  Eyes:  no discharge. No scleral icterus.  Neck: Normal range of motion. Neck supple. No JVD present.  Cardiovascular: Normal rate, regular rhythm, normal heart sounds and intact distal pulses. Exam reveals no gallop and no friction rub. No edema No murmur heard. Pulmonary/Chest: Effort normal and breath sounds normal. No stridor. No respiratory distress.  no wheezes.  no rales.  no tenderness.  Abdominal: Soft.  no distension.  no tenderness.  Musculoskeletal: Normal range of motion.  no  tenderness or deformity.  Neurological:  normal muscle tone. Coordination normal. No atrophy Skin: Skin is warm and dry. No rash noted. not diaphoretic.  Psychiatric:  normal mood and affect. behavior is normal. Thought content normal.   Recent Labs: 08/21/2023: Hemoglobin 12.6; Platelets 179.0 02/07/2024: ALT 9; BUN 18; Creatinine, Ser 0.69;  Potassium 4.0; Sodium 143; TSH 11.80   Lipid Panel Lab Results  Component Value Date   CHOL 186 02/07/2024   HDL 71.10 02/07/2024   LDLCALC 99 02/07/2024   TRIG 78.0 02/07/2024    Wt Readings from Last 3 Encounters:  03/17/24 138 lb (62.6 kg)  12/18/23 134 lb (60.8 kg)  10/28/23 132 lb 8 oz (60.1 kg)     ASSESSMENT AND PLAN:  Problem List Items Addressed This Visit       Cardiology Problems   Pure hypercholesterolemia   Relevant Medications   aspirin EC 81 MG tablet   Other Visit Diagnoses       PAD (peripheral artery disease) (HCC)    -  Primary   Relevant Medications   aspirin EC 81 MG tablet   Other Relevant Orders   EKG 12-Lead (Completed)     Calcification of left carotid artery       Relevant Medications   aspirin EC 81 MG tablet   Other Relevant Orders   EKG 12-Lead (Completed)     Mixed hyperlipidemia       Relevant Medications   aspirin EC 81 MG tablet     Anxiety          Carotid calcification Noted on dental x-ray on the left Recommend she continue on Lipitor No bruit on exam Carotid ultrasound with no significant disease, no further workup needed  Hyperlipidemia On Lipitor 10 Detailed that prior scoring a 2018 score was 0  Repeat calcium score 2023 score 0  Stress/adjustment disorder Lost her mother 2 years ago, getting over COVID 2024  Palpitations/tachycardia Denies significant tachycardia or palpitations, no medication changes made   Signed, Juanda Noon, M.D., Ph.D. [facility] [place], [place] [phone]

## 2024-03-17 ENCOUNTER — Ambulatory Visit: Payer: PPO | Attending: Cardiovascular Disease | Admitting: Cardiovascular Disease

## 2024-03-17 ENCOUNTER — Encounter: Payer: Self-pay | Admitting: Cardiovascular Disease

## 2024-03-17 VITALS — BP 120/70 | HR 65 | Ht 63.5 in | Wt 138.0 lb

## 2024-03-17 DIAGNOSIS — F419 Anxiety disorder, unspecified: Secondary | ICD-10-CM | POA: Diagnosis not present

## 2024-03-17 DIAGNOSIS — E782 Mixed hyperlipidemia: Secondary | ICD-10-CM

## 2024-03-17 DIAGNOSIS — I6522 Occlusion and stenosis of left carotid artery: Secondary | ICD-10-CM

## 2024-03-17 DIAGNOSIS — E7800 Pure hypercholesterolemia, unspecified: Secondary | ICD-10-CM | POA: Diagnosis not present

## 2024-03-17 DIAGNOSIS — I739 Peripheral vascular disease, unspecified: Secondary | ICD-10-CM | POA: Diagnosis not present

## 2024-03-17 NOTE — Patient Instructions (Signed)

## 2024-03-18 DIAGNOSIS — D485 Neoplasm of uncertain behavior of skin: Secondary | ICD-10-CM | POA: Diagnosis not present

## 2024-03-18 DIAGNOSIS — C44619 Basal cell carcinoma of skin of left upper limb, including shoulder: Secondary | ICD-10-CM | POA: Diagnosis not present

## 2024-03-18 DIAGNOSIS — D2261 Melanocytic nevi of right upper limb, including shoulder: Secondary | ICD-10-CM | POA: Diagnosis not present

## 2024-03-18 DIAGNOSIS — D2262 Melanocytic nevi of left upper limb, including shoulder: Secondary | ICD-10-CM | POA: Diagnosis not present

## 2024-03-18 DIAGNOSIS — D2272 Melanocytic nevi of left lower limb, including hip: Secondary | ICD-10-CM | POA: Diagnosis not present

## 2024-03-18 DIAGNOSIS — C44612 Basal cell carcinoma of skin of right upper limb, including shoulder: Secondary | ICD-10-CM | POA: Diagnosis not present

## 2024-03-18 DIAGNOSIS — Z85828 Personal history of other malignant neoplasm of skin: Secondary | ICD-10-CM | POA: Diagnosis not present

## 2024-03-18 DIAGNOSIS — D225 Melanocytic nevi of trunk: Secondary | ICD-10-CM | POA: Diagnosis not present

## 2024-03-23 ENCOUNTER — Other Ambulatory Visit (INDEPENDENT_AMBULATORY_CARE_PROVIDER_SITE_OTHER)

## 2024-03-23 ENCOUNTER — Encounter: Payer: Self-pay | Admitting: Family Medicine

## 2024-03-23 DIAGNOSIS — E039 Hypothyroidism, unspecified: Secondary | ICD-10-CM

## 2024-03-23 LAB — TSH: TSH: 0.13 u[IU]/mL — ABNORMAL LOW (ref 0.35–5.50)

## 2024-03-23 NOTE — Addendum Note (Signed)
 Addended by: Deri Fleet A on: 03/23/2024 08:03 PM   Modules accepted: Orders

## 2024-03-24 ENCOUNTER — Telehealth: Payer: Self-pay | Admitting: *Deleted

## 2024-03-24 ENCOUNTER — Ambulatory Visit: Admitting: Nurse Practitioner

## 2024-03-24 MED ORDER — LEVOTHYROXINE SODIUM 100 MCG PO TABS: 100.0000 ug | ORAL_TABLET | ORAL | 1 refills | Status: DC

## 2024-03-24 MED ORDER — LEVOTHYROXINE SODIUM 112 MCG PO TABS: 112.0000 ug | ORAL_TABLET | ORAL | 1 refills | Status: DC

## 2024-03-24 NOTE — Telephone Encounter (Signed)
-----   Message from [facility] sent at 03/23/2024  8:03 PM EDT ----- Going up from 100 mcg to 112 in levothyroxine  did work but now you are mildly over supplemented  If possible I would like you to take them intermittently / ever other day  100 mcg every other day, 112 every other day  Let me know if you need either dose sent in for this and change med list please  Re check tsh in about 4 weeks

## 2024-03-24 NOTE — Telephone Encounter (Signed)
 Pt notified of results and Dr. Belva Boyden comments, pt needed refills of both doses. Sent, and f/u lab appt scheduled

## 2024-03-26 ENCOUNTER — Ambulatory Visit
Admission: EM | Admit: 2024-03-26 | Discharge: 2024-03-26 | Disposition: A | Discharge status: Home / Self Care | Attending: Emergency Medicine | Admitting: Emergency Medicine

## 2024-03-26 ENCOUNTER — Ambulatory Visit (INDEPENDENT_AMBULATORY_CARE_PROVIDER_SITE_OTHER)

## 2024-03-26 DIAGNOSIS — R0781 Pleurodynia: Secondary | ICD-10-CM

## 2024-03-26 NOTE — ED Triage Notes (Signed)
 Patient to Urgent Care with complaints of left sided, lower rib pain.  Reports that she was bending over and felt something "pop". Occurred approx two weeks ago. Has been pulling large plant pots around.   Using heating pad.

## 2024-03-26 NOTE — Discharge Instructions (Signed)
 Take Tylenol  or ibuprofen  as needed for discomfort. Follow-up with your primary care provider.   Your x-ray is pending.

## 2024-03-26 NOTE — ED Provider Notes (Signed)
 Gabrielle Webb    CSN: [id number] Arrival date & time: 03/26/24  1216      History   Chief Complaint Chief Complaint  Patient presents with   Rib Injury    HPI Gabrielle Webb is a 83 y.o. female.  Patient presents with left side rib pain x 2 weeks.  The pain started when she bent over reaching with her left arm.  She felt a "pop" and has had pain since then.  She also has been moving a heavy plant.  She has been using a heating pad for the pain.  She denies chest pain or shortness of breath.    The history is provided by the patient and medical records.    Past Medical History:  Diagnosis Date   Allergy    Anxiety    Breast cancer (HCC) 7/12   Right   Cellulitis    Diverticulosis of colon    GERD (gastroesophageal reflux disease)    Headache    sinus   History of hiatal hernia    Hx of radiation therapy 06/20/11 - 07/11/11   right breast   Hypothyroid    Osteoporosis    Personal history of radiation therapy 2012   Right Breast Cancer   PONV (postoperative nausea and vomiting)    Skin cancer    basal and squamous cell   Vertigo     Patient Active Problem List   Diagnosis Date Noted   Acute cystitis without hematuria 12/18/2023   Episode of confusion 10/28/2023   Memory change 10/02/2023   Left inguinal pain 08/21/2023   Normocytic anemia 07/09/2023   Post covid-19 condition, unspecified 07/05/2023   Dark stools 07/05/2023   Hypokalemia 06/30/2023   Skin lesion 05/02/2023   Right knee pain 04/24/2023   Vaginal pain 01/18/2023   Grief reaction 07/04/2022   Encounter for screening mammogram for breast cancer 12/18/2021   Anxious mood as adjustment reaction 06/09/2021   History of breast cancer 05/03/2020   Medicare annual wellness visit, subsequent 05/28/2018   Estrogen deficiency 05/28/2018   Fatigue 05/22/2017   S/P TAH-BSO 11/08/2016   Routine general medical examination at a health care facility 05/06/2016   Malignant neoplasm of upper-outer  quadrant of right breast in female, estrogen receptor positive (HCC) 03/21/2015   History of tamoxifen  therapy 05/18/2014   Encounter for routine gynecological examination 05/06/2013   Colon cancer screening 05/06/2013   Encounter for Medicare annual wellness exam 04/28/2013   Hx of radiation therapy    Hx of Breast cancer, stage 1, Right, UOQ, Receptor+,Her2- 05/29/2011    Class: Stage 1   Other screening mammogram 03/21/2011   Post-menopausal 03/21/2011   Hypothyroidism 01/23/2008   Pure hypercholesterolemia 01/23/2008   Allergic rhinitis 01/23/2008   GERD 01/23/2008   Diverticulosis of colon 01/23/2008   IBS 01/23/2008   FIBROCYSTIC BREAST DISEASE 01/23/2008   Osteoporosis 10/17/2007    Past Surgical History:  Procedure Laterality Date   ABDOMINAL HYSTERECTOMY N/A 11/08/2016   Procedure: HYSTERECTOMY TOTAL  ABDOMINAL;  Surgeon: Thurman Flores, MD;  Location: [facility];  Service: Gynecology;  Laterality: N/A;   APPENDECTOMY  07/2007   back sugery  1970   BREAST BIOPSY Left    Benign   BREAST EXCISIONAL BIOPSY Left 1983   BREAST EXCISIONAL BIOPSY Left 1983   BREAST LUMPECTOMY Right 05/16/2011   COLONOSCOPY     COLONOSCOPY WITH PROPOFOL  N/A 08/27/2018   Procedure: COLONOSCOPY WITH PROPOFOL ;  Surgeon: Cassie Click, MD;  Location:  [facility] ENDOSCOPY;  Service: Endoscopy;  Laterality: N/A;   COLONOSCOPY WITH PROPOFOL  N/A 08/16/2021   Procedure: COLONOSCOPY WITH PROPOFOL ;  Surgeon: Marshall Skeeter, MD;  Location: [facility] ENDOSCOPY;  Service: Endoscopy;  Laterality: N/A;   DIAGNOSTIC LAPAROSCOPY     DILATION AND CURETTAGE OF UTERUS N/A 05/31/2014   Procedure: DILATATION AND CURETTAGE WITH ULTRASOUND GUIDANCE;  Surgeon: Martine Sleek, MD;  Location: [facility];  Service: Gynecology;  Laterality: N/A;   ESOPHAGOGASTRODUODENOSCOPY  04/20/2003   KNEE SURGERY  1962   MASTECTOMY     right eye surgery     SALPINGOOPHORECTOMY Bilateral 11/08/2016   Procedure: BILATERAL SALPINGO  OOPHORECTOMY;  Surgeon: Thurman Flores, MD;  Location: [facility];  Service: Gynecology;  Laterality: Bilateral;   UPPER GI ENDOSCOPY      OB History   No obstetric history on file.      Home Medications    Prior to Admission medications   Medication Sig Start Date End Date Taking? Authorizing Provider  aspirin EC 81 MG tablet Take 81 mg by mouth daily.    [provider]  atorvastatin  (LIPITOR) 10 MG tablet TAKE 1 TABLET BY MOUTH DAILY 05/03/23   Tower, Manley Seeds, MD  BIOTIN PO Take by mouth daily.    [provider]  CALCIUM  PO Take 1,500 mg by mouth daily.    [provider]  carboxymethylcellulose (REFRESH PLUS) 0.5 % SOLN 1 drop 3 (three) times daily as needed.    [provider]  chlorthalidone  (HYGROTON ) 25 MG tablet TAKE TWO TABLETS EVERY DAY AS NEEDED FORSWELLING 06/28/21   Tower, Manley Seeds, MD  Cholecalciferol (VITAMIN D ) 2000 UNITS CAPS Take 1 capsule by mouth daily.    [provider]  Cyanocobalamin  (B-12 PO) Take 2,500 mcg by mouth daily.    [provider]  denosumab  (PROLIA ) 60 MG/ML SOSY injection Inject 60 mg into the skin every 6 (six) months. 11/20/23   Tower, Manley Seeds, MD  diclofenac  sodium (VOLTAREN ) 1 % GEL Apply 2 g topically 4 (four) times daily. Rub into affected area of foot 2 to 4 times daily Patient taking differently: Apply 2 g topically as needed. Rub into affected area of foot 2 to 4 times daily 12/21/14   Charity Conch, DPM  dicyclomine  (BENTYL ) 10 MG capsule TAKE 1 CAPSULE BY MOUTH TWICE DAILY FOR SPASMS 03/13/23   Tower, Manley Seeds, MD  escitalopram  (LEXAPRO ) 5 MG tablet Take 5 mg by mouth daily. Takes inconsistently    [provider]  famotidine  (PEPCID ) 20 MG tablet TAKE ONE TABLET EVERY DAY AS NEEDED FOR HEARTBURN OR INDIGESTION 07/25/22   Tower, Marne A, MD  Flaxseed, Linseed, (FLAX SEED OIL PO) Take by mouth. 2 a day    [provider]  fluticasone  (FLONASE ) 50 MCG/ACT nasal spray SPRAY  TWICE INTO EACH NOSTRIL ONCE DAILY 03/03/24   Tower, Manley Seeds, MD  levothyroxine  (SYNTHROID ) 100 MCG tablet Take 1 tablet (100 mcg total) by mouth every other day. Alternate with 112 mcg dose 03/24/24   Tower, Manley Seeds, MD  levothyroxine  (SYNTHROID ) 112 MCG tablet Take 1 tablet (112 mcg total) by mouth every other day. Alternate with 100 mcg dose 03/24/24   Tower, Manley Seeds, MD  Multiple Vitamin (MULTIVITAMIN) capsule Take 1 capsule by mouth daily.    [provider]  Propylene Glycol (SYSTANE COMPLETE OP) Apply to eye.    [provider]    Family History Family History  Problem Relation Age of Onset  Atrial fibrillation Mother    Coronary artery disease Mother    Congenital heart disease Mother    Heart attack Father 40   Hypertension Father    Diabetes Maternal Aunt    Cancer Maternal Aunt        leukemia   Diabetes Maternal Uncle    Cancer Maternal Uncle        colon   Diabetes Other        Grandmother   Coronary artery disease Other        Grandmother   Uterine cancer Other        Grandmother   Leukemia Other        Aunt    Social History Social History   Tobacco Use   Smoking status: Never   Smokeless tobacco: Never  Vaping Use   Vaping status: Never Used  Substance Use Topics   Alcohol use: No    Alcohol/week: 0.0 standard drinks of alcohol   Drug use: No     Allergies   Codeine, Fentanyl , Hydromorphone , Morphine, Ciprofloxacin , Tape, and Prednisone   Review of Systems Review of Systems  Constitutional:  Negative for chills and fever.  Respiratory:  Negative for cough and shortness of breath.   Cardiovascular:  Negative for chest pain and palpitations.  Musculoskeletal:        Left side rib pain.  Skin:  Negative for color change, rash and wound.     Physical Exam Triage Vital Signs ED Triage Vitals [03/26/24 1237]  Encounter Vitals Group     BP 99/60     Systolic BP Percentile      Diastolic BP Percentile      Pulse Rate 88      Resp 18     Temp 98 F (36.7 C)     Temp src      SpO2 98 %     Weight      Height      Head Circumference      Peak Flow      Pain Score      Pain Loc      Pain Education      Exclude from Growth Chart    No data found.  Updated Vital Signs BP 99/60   Pulse 88   Temp 98 F (36.7 C)   Resp 18   LMP 12/03/1988   SpO2 98%   Visual Acuity Right Eye Distance:   Left Eye Distance:   Bilateral Distance:    Right Eye Near:   Left Eye Near:    Bilateral Near:     Physical Exam Constitutional:      General: She is not in acute distress. HENT:     Mouth/Throat:     Mouth: Mucous membranes are moist.  Cardiovascular:     Rate and Rhythm: Normal rate and regular rhythm.     Heart sounds: Normal heart sounds.  Pulmonary:     Effort: Pulmonary effort is normal. No respiratory distress.     Breath sounds: Normal breath sounds.  Musculoskeletal:        General: Tenderness present. No swelling or deformity. Normal range of motion.     Comments: Mild tenderness to palpation of left ribcage.   Skin:    General: Skin is warm and dry.     Findings: No bruising, erythema, lesion or rash.  Neurological:     Mental Status: She is alert.      UC Treatments / Results  Labs (all labs ordered are listed, but only abnormal results are displayed) Labs Reviewed - No data to display  EKG   Radiology DG Ribs Unilateral W/Chest Left Result Date: 03/26/2024 CLINICAL DATA:  Rib pain EXAM: LEFT RIBS AND CHEST - 3+ VIEW COMPARISON:  None Available. FINDINGS: No fracture or other bone lesions are seen involving the ribs. There is no evidence of pneumothorax or pleural effusion. Both lungs are clear. Heart size and mediastinal contours are within normal limits. IMPRESSION: Negative. Electronically Signed   By: Fredrich Jefferson M.D.   On: 03/26/2024 14:17    Procedures Procedures (including critical care time)  Medications Ordered in UC Medications - No data to display  Initial  Impression / Assessment and Plan / UC Course  I have reviewed the triage vital signs and the nursing notes.  Pertinent labs & imaging results that were available during my care of the patient were reviewed by me and considered in my medical decision making (see chart for details).    Left side rib pain.  Afebrile and vital signs are stable.  Lungs are clear and O2 sat is 98%.  X-ray of left ribs with chest negative.  Discussed symptomatic treatment including Tylenol  or ibuprofen  as needed, warm compresses.  Instructed patient to follow-up with her PCP.  ED precautions given.  She agrees to plan of care.  Final Clinical Impressions(s) / UC Diagnoses   Final diagnoses:  Rib pain on left side     Discharge Instructions      Take Tylenol  or ibuprofen  as needed for discomfort. Follow-up with your primary care provider.   Your x-ray is pending.       ED Prescriptions   None    PDMP not reviewed this encounter.   Wellington Half, NP 03/26/24 [phone]

## 2024-03-27 ENCOUNTER — Other Ambulatory Visit: Payer: Self-pay | Admitting: Family Medicine

## 2024-03-30 NOTE — Telephone Encounter (Signed)
 You should be alternating the 100 and 112 doses of levothyroxine    Please follow up for the thyroid  symptoms

## 2024-03-30 NOTE — Telephone Encounter (Signed)
 CPE is on 07/27/24 but please see note from the call ctr saying pt is having thyroid  discomfort?, please advise

## 2024-03-30 NOTE — Telephone Encounter (Unsigned)
 Copied from CRM [id number]. Topic: Clinical - Medical Advice >> Mar 30, 2024 10:50 AM Rosaria Common wrote: Reason for CRM: Thyroid  is giving discomfort. Medication escitalopram  (LEXAPRO ) 5 MG tablet. Needs medical advice on correct dosage to take.

## 2024-03-31 NOTE — Telephone Encounter (Signed)
 Pt is alternating thyroid  med. Please schedule an appt to discuss thyroid  problems

## 2024-04-02 ENCOUNTER — Encounter: Payer: Self-pay | Admitting: Family Medicine

## 2024-04-02 ENCOUNTER — Ambulatory Visit (INDEPENDENT_AMBULATORY_CARE_PROVIDER_SITE_OTHER): Admitting: Family Medicine

## 2024-04-02 VITALS — BP 122/60 | HR 88 | Temp 98.1°F | Ht 62.5 in | Wt 129.1 lb

## 2024-04-02 DIAGNOSIS — F4322 Adjustment disorder with anxiety: Secondary | ICD-10-CM

## 2024-04-02 DIAGNOSIS — E039 Hypothyroidism, unspecified: Secondary | ICD-10-CM | POA: Diagnosis not present

## 2024-04-02 NOTE — Progress Notes (Signed)
 Subjective:    Patient ID: Gabrielle Webb, female    DOB: [birth year], 83 y.o.   MRN: [id number]  HPI  Wt Readings from Last 3 Encounters:  04/02/24 129 lb 2 oz (58.6 kg)  03/17/24 138 lb (62.6 kg)  12/18/23 134 lb (60.8 kg)   23.24 kg/m  Vitals:   04/02/24 1040  BP: 122/60  Pulse: 88  Temp: 98.1 F (36.7 C)  SpO2: 100%     Pt presents for follow up of hypothyroidism and mood  Also long haul covid symptoms  More jittery inside recently  Gets upset/frustrated easily  At times jittery and down   Has friends that she talks to who she knows her well  Helps her get things out and vent  Working on learning a card game and getting more social   Is too hard on herself   Learning who she can trust  Lost a friend she could talk to  Stressed over world event   Fluoxetine -decided not to take it (husband took it and it changed his personality)      Decided to try the lexapro  2-3 weeks ago  Started the 5 mg - taking it as needed perhaps 3-4 days per week  Not working yet but also no side effects         04/02/2024   12:04 PM 10/28/2023    9:26 AM 07/02/2023    9:01 AM 05/02/2023    8:27 AM 09/20/2022    8:49 AM  Depression screen PHQ 2/9  Decreased Interest 3 0 0 0 0  Down, Depressed, Hopeless 1 0 0 0 0  PHQ - 2 Score 4 0 0 0 0  Altered sleeping 0 0  0   Tired, decreased energy 2 3  0   Change in appetite 0 0  0   Feeling bad or failure about yourself  0 0  0   Trouble concentrating 0 0  0   Moving slowly or fidgety/restless 0 0  0   Suicidal thoughts 0 0  0   PHQ-9 Score 6 3  0   Difficult doing work/chores  Not difficult at all  Not difficult at all       04/02/2024   12:04 PM 05/02/2023    8:27 AM 06/09/2021   10:18 AM  GAD 7 : Generalized Anxiety Score  Nervous, Anxious, on Edge 1 0 3  Control/stop worrying 1 0 3  Worry too much - different things 1 0 3  Trouble relaxing 0 0 3  Restless 0 0   Easily annoyed or irritable 0 0   Afraid - awful might  happen 0 0   Total GAD 7 Score 3 0   Anxiety Difficulty Not difficult at all Not difficult at all Not difficult at all      On 3/7 TSH rose to 11.80 and we increased levothyroxine  dose to 112 mcg   Then next lab was too low  Lab Results  Component Value Date   TSH 0.13 (L) 03/23/2024   Instructed to intermittently dose   Levothyroxine  112 mcg every other day with 100 mcg every other day   Patient Active Problem List   Diagnosis Date Noted   Episode of confusion 10/28/2023   Memory change 10/02/2023   Left inguinal pain 08/21/2023   Normocytic anemia 07/09/2023   Post covid-19 condition, unspecified 07/05/2023   Dark stools 07/05/2023   Hypokalemia 06/30/2023   Skin lesion 05/02/2023   Right knee  pain 04/24/2023   Vaginal pain 01/18/2023   Grief reaction 07/04/2022   Encounter for screening mammogram for breast cancer 12/18/2021   Anxious mood as adjustment reaction 06/09/2021   History of breast cancer 05/03/2020   Medicare annual wellness visit, subsequent 05/28/2018   Estrogen deficiency 05/28/2018   Fatigue 05/22/2017   S/P TAH-BSO 11/08/2016   Routine general medical examination at a health care facility 05/06/2016   Malignant neoplasm of upper-outer quadrant of right breast in female, estrogen receptor positive (HCC) 03/21/2015   History of tamoxifen  therapy 05/18/2014   Encounter for routine gynecological examination 05/06/2013   Colon cancer screening 05/06/2013   Encounter for Medicare annual wellness exam 04/28/2013   Hx of radiation therapy    Hx of Breast cancer, stage 1, Right, UOQ, Receptor+,Her2- 05/29/2011    Class: Stage 1   Other screening mammogram 03/21/2011   Post-menopausal 03/21/2011   Hypothyroidism 01/23/2008   Pure hypercholesterolemia 01/23/2008   Allergic rhinitis 01/23/2008   GERD 01/23/2008   Diverticulosis of colon 01/23/2008   IBS 01/23/2008   FIBROCYSTIC BREAST DISEASE 01/23/2008   Osteoporosis 10/17/2007   Past Medical  History:  Diagnosis Date   Allergy    Anxiety    Breast cancer (HCC) 7/12   Right   Cellulitis    Diverticulosis of colon    GERD (gastroesophageal reflux disease)    Headache    sinus   History of hiatal hernia    Hx of radiation therapy 06/20/11 - 07/11/11   right breast   Hypothyroid    Osteoporosis    Personal history of radiation therapy 2012   Right Breast Cancer   PONV (postoperative nausea and vomiting)    Skin cancer    basal and squamous cell   Vertigo    Past Surgical History:  Procedure Laterality Date   ABDOMINAL HYSTERECTOMY N/A 11/08/2016   Procedure: HYSTERECTOMY TOTAL  ABDOMINAL;  Surgeon: Thurman Flores, MD;  Location: [facility];  Service: Gynecology;  Laterality: N/A;   APPENDECTOMY  07/2007   back sugery  1970   BREAST BIOPSY Left    Benign   BREAST EXCISIONAL BIOPSY Left 1983   BREAST EXCISIONAL BIOPSY Left 1983   BREAST LUMPECTOMY Right 05/16/2011   COLONOSCOPY     COLONOSCOPY WITH PROPOFOL  N/A 08/27/2018   Procedure: COLONOSCOPY WITH PROPOFOL ;  Surgeon: Cassie Click, MD;  Location: [facility];  Service: Endoscopy;  Laterality: N/A;   COLONOSCOPY WITH PROPOFOL  N/A 08/16/2021   Procedure: COLONOSCOPY WITH PROPOFOL ;  Surgeon: Marshall Skeeter, MD;  Location: [facility] END[facility]COPY;  Service: Endoscopy;  Laterality: N/A;   DIAGNOSTIC LAPAROSCOPY     DILATION AND CURETTAGE OF UTERUS N/A 05/31/2014   Procedure: DILATATION AND CURETTAGE WITH ULTRASOUND GUIDANCE;  Surgeon: Martine Sleek, MD;  Location: [facility];  Service: Gynecology;  Laterality: N/A;   ESOPHAGOGASTRODUODENOSCOPY  04/20/2003   KNEE SURGERY  1962   MASTECTOMY     right eye surgery     SALPINGOOPHORECTOMY Bilateral 11/08/2016   Procedure: BILATERAL SALPINGO OOPHORECTOMY;  Surgeon: Thurman Flores, MD;  Location: [facility];  Service: Gynecology;  Laterality: Bilateral;   UPPER GI ENDOSCOPY     Social History   Tobacco Use   Smoking status: Never   Smokeless tobacco: Never  Vaping Use    Vaping status: Never Used  Substance Use Topics   Alcohol use: No    Alcohol/week: 0.0 standard drinks of alcohol   Drug use: No   Family History  Problem Relation Age  of Onset   Atrial fibrillation Mother    Coronary artery disease Mother    Congenital heart disease Mother    Heart attack Father 21   Hypertension Father    Diabetes Maternal Aunt    Cancer Maternal Aunt        leukemia   Diabetes Maternal Uncle    Cancer Maternal Uncle        colon   Diabetes Other        Grandmother   Coronary artery disease Other        Grandmother   Uterine cancer Other        Grandmother   Leukemia Other        Aunt   Allergies  Allergen Reactions   Codeine Nausea And Vomiting   Fentanyl  Nausea And Vomiting   Hydromorphone  Nausea And Vomiting        Morphine Nausea And Vomiting   Ciprofloxacin  Other (See Comments)    Intolerant- tingling  ciprofloxacin    Tape     Surgical tape   Prednisone Palpitations    Rapid hear beat   Current Outpatient Medications on File Prior to Visit  Medication Sig Dispense Refill   aspirin EC 81 MG tablet Take 81 mg by mouth daily.     atorvastatin  (LIPITOR) 10 MG tablet TAKE 1 TABLET BY MOUTH DAILY 90 tablet 0   BIOTIN PO Take by mouth daily.     CALCIUM  PO Take 1,500 mg by mouth daily.     carboxymethylcellulose (REFRESH PLUS) 0.5 % SOLN 1 drop 3 (three) times daily as needed.     chlorthalidone  (HYGROTON ) 25 MG tablet TAKE TWO TABLETS EVERY DAY AS NEEDED FORSWELLING 180 tablet 0   Cholecalciferol (VITAMIN D ) 2000 UNITS CAPS Take 1 capsule by mouth daily.     Cyanocobalamin  (B-12 PO) Take 2,500 mcg by mouth daily.     denosumab  (PROLIA ) 60 MG/ML SOSY injection Inject 60 mg into the skin every 6 (six) months. 1 mL 0   diclofenac  sodium (VOLTAREN ) 1 % GEL Apply 2 g topically 4 (four) times daily. Rub into affected area of foot 2 to 4 times daily (Patient taking differently: Apply 2 g topically as needed. Rub into affected area of foot 2 to 4  times daily) 100 g 2   dicyclomine  (BENTYL ) 10 MG capsule TAKE 1 CAPSULE BY MOUTH TWICE DAILY FOR SPASMS 180 capsule 2   escitalopram  (LEXAPRO ) 5 MG tablet TAKE 1 TABLET BY MOUTH DAILY 90 tablet 1   famotidine  (PEPCID ) 20 MG tablet TAKE ONE TABLET EVERY DAY AS NEEDED FOR HEARTBURN OR INDIGESTION 90 tablet 3   Flaxseed, Linseed, (FLAX SEED OIL PO) Take by mouth. 2 a day     fluticasone  (FLONASE ) 50 MCG/ACT nasal spray SPRAY TWICE INTO EACH NOSTRIL ONCE DAILY 16 g 3   levothyroxine  (SYNTHROID ) 100 MCG tablet Take 1 tablet (100 mcg total) by mouth every other day. Alternate with 112 mcg dose 45 tablet 1   levothyroxine  (SYNTHROID ) 112 MCG tablet Take 1 tablet (112 mcg total) by mouth every other day. Alternate with 100 mcg dose 45 tablet 1   Multiple Vitamin (MULTIVITAMIN) capsule Take 1 capsule by mouth daily.     Propylene Glycol (SYSTANE COMPLETE OP) Apply to eye.     Current Facility-Administered Medications on File Prior to Visit  Medication Dose Route Frequency Provider Last Rate Last Admin   denosumab  (PROLIA ) injection 60 mg  60 mg Subcutaneous Once Shadd Dunstan, Manley Seeds, MD       [  START ON 06/01/2024] denosumab  (PROLIA ) injection 60 mg  60 mg Subcutaneous Once Kemet Nijjar, Manley Seeds, MD        Review of Systems  Constitutional:  Positive for fatigue. Negative for activity change, appetite change, fever and unexpected weight change.  HENT:  Negative for congestion, ear pain, rhinorrhea, sinus pressure and sore throat.   Eyes:  Negative for pain, redness and visual disturbance.  Respiratory:  Negative for cough, shortness of breath and wheezing.   Cardiovascular:  Negative for chest pain and palpitations.  Gastrointestinal:  Negative for abdominal pain, blood in stool, constipation and diarrhea.  Endocrine: Negative for polydipsia and polyuria.  Genitourinary:  Negative for dysuria, frequency and urgency.  Musculoskeletal:  Negative for arthralgias, back pain and myalgias.  Skin:  Negative for pallor  and rash.  Allergic/Immunologic: Negative for environmental allergies.  Neurological:  Negative for dizziness, syncope and headaches.  Hematological:  Negative for adenopathy. Does not bruise/bleed easily.  Psychiatric/Behavioral:  Negative for decreased concentration and dysphoric mood. The patient is nervous/anxious.        Objective:   Physical Exam Constitutional:      General: She is not in acute distress.    Appearance: Normal appearance. She is well-developed and normal weight. She is not ill-appearing or diaphoretic.  HENT:     Head: Normocephalic and atraumatic.  Eyes:     Conjunctiva/sclera: Conjunctivae normal.     Pupils: Pupils are equal, round, and reactive to light.  Neck:     Thyroid : No thyromegaly.     Vascular: No carotid bruit or JVD.  Cardiovascular:     Rate and Rhythm: Normal rate and regular rhythm.     Heart sounds: Normal heart sounds.     No gallop.  Pulmonary:     Effort: Pulmonary effort is normal. No respiratory distress.     Breath sounds: Normal breath sounds. No wheezing or rales.  Abdominal:     General: There is no distension or abdominal bruit.     Palpations: Abdomen is soft.  Musculoskeletal:     Cervical back: Normal range of motion and neck supple.     Right lower leg: No edema.     Left lower leg: No edema.  Lymphadenopathy:     Cervical: No cervical adenopathy.  Skin:    General: Skin is warm and dry.     Coloration: Skin is not pale.     Findings: No rash.  Neurological:     Mental Status: She is alert.     Motor: No tremor.     Coordination: Coordination normal.     Deep Tendon Reflexes: Reflexes are normal and symmetric. Reflexes normal.  Psychiatric:        Mood and Affect: Mood is anxious. Affect is not blunt, flat or tearful.        Speech: Speech is tangential.        Cognition and Memory: Cognition and memory normal.     Comments: Candidly discusses symptoms and stressors             Assessment & Plan:    Problem List Items Addressed This Visit       Endocrine   Hypothyroidism   Lab Results  Component Value Date   TSH 0.13 (L) 03/23/2024   Will continue taking levothyroxine  100, 112 mcg intermittently  Labs 5/19  Unsure if this could be adding to her mood  Reassuring exam        Other   Anxious  mood as adjustment reaction - Primary   Struggling Did not take fluoxetine  (afraid of it) Some mild cognitive issues but unsure if this is from anxiety or brain fog On lexapro  5 prn and no side effects Encouraged strongly to take this EVERY day in the evening- explained it will take a while to work  Encouraged to call if side effects Offered counseling referral -she will think about it  Given info on CBT to read and consider  Reviewed stressors/ coping techniques/symptoms/ support sources/ tx options and side effects in detail today Discussed ways to deal with stress that are healthy, socialization/exercise/church   36 Minutes were spent today both face to face and in the chart obtaining history, reviewing records and test results, performing exam , educating and discussing treatment options, and writing and reviewing instructions and answering questions

## 2024-04-02 NOTE — Assessment & Plan Note (Addendum)
 Lab Results  Component Value Date   TSH 0.13 (L) 03/23/2024   Will continue taking levothyroxine  100, 112 mcg intermittently  Labs 5/19  Unsure if this could be adding to her mood  Reassuring exam

## 2024-04-02 NOTE — Patient Instructions (Addendum)
 I want you to continue the generic generic lexapro  at 5 mg daily every single day at approximately the same time (at night)   We need to give this at least a month to give it time to work before we decide to increase the dose or change it   Let us  know if you are interested in talk therapy (behavioral health counseling) in the future It can be very helpful    The thyroid  medicine titration is a work in progress  Keep taking the 100 / 112 dose every other day and keep your lab appointment for later this month  Always take in the am   Stay social  Keep playing cards Work on the perfectionism    Follow up in 4-6 weeks

## 2024-04-02 NOTE — Assessment & Plan Note (Signed)
 Struggling Did not take fluoxetine  (afraid of it) Some mild cognitive issues but unsure if this is from anxiety or brain fog On lexapro  5 prn and no side effects Encouraged strongly to take this EVERY day in the evening- explained it will take a while to work  Encouraged to call if side effects Offered counseling referral -she will think about it  Given info on CBT to read and consider  Reviewed stressors/ coping techniques/symptoms/ support sources/ tx options and side effects in detail today Discussed ways to deal with stress that are healthy, socialization/exercise/church   36 Minutes were spent today both face to face and in the chart obtaining history, reviewing records and test results, performing exam , educating and discussing treatment options, and writing and reviewing instructions and answering questions

## 2024-04-20 ENCOUNTER — Telehealth: Payer: Self-pay | Admitting: *Deleted

## 2024-04-20 ENCOUNTER — Other Ambulatory Visit (INDEPENDENT_AMBULATORY_CARE_PROVIDER_SITE_OTHER)

## 2024-04-20 DIAGNOSIS — E039 Hypothyroidism, unspecified: Secondary | ICD-10-CM

## 2024-04-20 NOTE — Telephone Encounter (Signed)
 Copied from CRM [id number]. Topic: Clinical - Lab/Test Results >> Apr 20, 2024  2:49 PM [name] wrote: Reason for CRM: Patient states she received a text message to view her lab results in MyChart, agent not finding any results back from labs or messages to relay to patient.  Miarose [phone]

## 2024-04-20 NOTE — Telephone Encounter (Signed)
 It was probably the AVS for her lab appt today. Results are not back yet

## 2024-04-21 ENCOUNTER — Ambulatory Visit: Payer: Self-pay | Admitting: Family Medicine

## 2024-04-21 ENCOUNTER — Telehealth: Payer: Self-pay

## 2024-04-21 LAB — TSH: TSH: 0.06 u[IU]/mL — ABNORMAL LOW (ref 0.35–5.50)

## 2024-04-21 NOTE — Telephone Encounter (Signed)
 Pt has sent a mychart message regarding this, please see mychart

## 2024-04-21 NOTE — Telephone Encounter (Signed)
**Note De-identified Stoll Obfuscation** Result note done.

## 2024-04-21 NOTE — Telephone Encounter (Signed)
 Patient would like a call back regarding her thyroid  medication

## 2024-04-21 NOTE — Telephone Encounter (Signed)
 Copied from CRM [id number]. Topic: Clinical - Lab/Test Results >> Apr 21, 2024  3:17 PM Baldo Levan wrote: Reason for CRM: Patient calling in requesting to go over her thyroid  medication. Called CAL as the patient stated she has been talking to a nurse in the office, CAL stated that they are still waiting on the lab work to come back to be able to answer the patients questions. Relayed this information to the patient and she stated she would like a call back at [phone] when this information becomes available.

## 2024-04-21 NOTE — Telephone Encounter (Signed)
 Copied from CRM [id number]. Topic: Clinical - Lab/Test Results >> Apr 20, 2024  2:49 PM [name] wrote: Reason for CRM: Patient states she received a text message to view her lab results in MyChart, agent not finding any results back from labs or messages to relay to patient.  Gabrielle Webb [phone] >> Apr 21, 2024  3:01 PM Gabrielle Webb wrote: Patient called stating no one has called her regarding her thyroid  medication. Patient stated that this was very important. Patient states she received a message on how to take her thyroid  medication but it is different doses or mg each day. Patient would like a call back >> Apr 21, 2024 10:11 AM Gabrielle Webb wrote: patient would like to be called in regards to her thyroid  medication. Patient left previous message regarding this. Patient her thyroid  is low CB [phone] [phone]

## 2024-05-06 ENCOUNTER — Other Ambulatory Visit (HOSPITAL_COMMUNITY): Payer: Self-pay

## 2024-05-06 ENCOUNTER — Telehealth: Payer: Self-pay

## 2024-05-06 NOTE — Telephone Encounter (Signed)
 Prolia  VOB initiated via [email or web address]  Next Prolia  inj DUE: 06/01/24

## 2024-05-06 NOTE — Telephone Encounter (Signed)
 Pt ready for scheduling for PROLIA  on or after : 06/01/24  Option# 1: Buy/Bill (Office supplied medication)  Out-of-pocket cost due at time of clinic visit: $332  Number of injection/visits approved: ---  Primary: [employer] Prolia  co-insurance: 20% Admin fee co-insurance: 0%  Secondary: --- Prolia  co-insurance:  Admin fee co-insurance:   Medical Benefit Details: Date Benefits were checked: 05/06/24 Deductible: NO/ Coinsurance: 20%/ Admin Fee: 0%  Prior Auth: N/A PA# Expiration Date:   # of doses approved: ----------------------------------------------------------------------- Option# 2- Med Obtained from pharmacy:  Pharmacy benefit: Copay $250 (Paid to pharmacy) Admin Fee: 0% (Pay at clinic)  Prior Auth: N/A PA# Expiration Date:   # of doses approved:   If patient wants fill through the pharmacy benefit please send prescription to: [employer] [facility], and include estimated need by date in rx notes. Pharmacy will ship medication directly to the office.  Patient NOT eligible for Prolia  Copay Card. Copay Card can make patient's cost as little as $25. Link to apply: [email or web address]://[email or web address]  ** This summary of benefits is an estimation of the patient's out-of-pocket cost. Exact cost may very based on individual plan coverage.

## 2024-05-07 ENCOUNTER — Ambulatory Visit (INDEPENDENT_AMBULATORY_CARE_PROVIDER_SITE_OTHER): Admitting: Family Medicine

## 2024-05-07 ENCOUNTER — Ambulatory Visit: Admitting: Family Medicine

## 2024-05-07 ENCOUNTER — Encounter: Payer: Self-pay | Admitting: Family Medicine

## 2024-05-07 ENCOUNTER — Ambulatory Visit: Payer: Self-pay | Admitting: Family Medicine

## 2024-05-07 VITALS — BP 126/74 | HR 73 | Temp 97.7°F | Ht 62.5 in | Wt 127.5 lb

## 2024-05-07 DIAGNOSIS — E039 Hypothyroidism, unspecified: Secondary | ICD-10-CM | POA: Diagnosis not present

## 2024-05-07 DIAGNOSIS — F4322 Adjustment disorder with anxiety: Secondary | ICD-10-CM | POA: Diagnosis not present

## 2024-05-07 LAB — TSH: TSH: 0.04 u[IU]/mL — ABNORMAL LOW (ref 0.35–5.50)

## 2024-05-07 MED ORDER — LEVOTHYROXINE SODIUM 75 MCG PO TABS
75.0000 ug | ORAL_TABLET | Freq: Every day | ORAL | 0 refills | Status: AC
Start: 2024-05-07 — End: ?

## 2024-05-07 NOTE — Assessment & Plan Note (Signed)
 Overall improved with lexapro  5 mg daily (instead of prn) Pt took an extra this am (5 mg last night and then 5 mg this am) and feels even better She may be ready for 10 mg dose Will decide if she wants to take 5 mg bid or 10 mg once daily  No side effects  Discussed expectations of SSRI medication including time to effectiveness and mechanism of action, also poss of side effects (early and late)- including mental fuzziness, weight or appetite change, nausea and poss of worse dep or anxiety (even suicidal thoughts)  Pt voiced understanding and will stop med and update if this occurs    Will continue to monitor  Suspect brain fog will continue to improve also   Reviewed stressors/ coping techniques/symptoms/ support sources/ tx options and side effects in detail today Given handout on managing anxiety   Call back and Er precautions noted in detail today

## 2024-05-07 NOTE — Patient Instructions (Addendum)
 I think some of your brain fog (which seems to be getting better) relates back to  Post covid syndrome Age  Anxiety (anxious mood)  Thyroid  problems   We will continue to work on all of these things   Let's go ahead and advance lexapro  from 5 to 10 mg daily  You can either take 5 mg twice daily or take 10 mg once daily in the evening   Take care of yourself  Keep socializing  Keep exercising  Thyroid  labs today  For shots:  Get a flu shot every fall -here of pharmacy  Get a covid shot once yearly (fall is fine) -pharmacy  RSV shot is a good idea - that you just get once at a pharmacy

## 2024-05-07 NOTE — Assessment & Plan Note (Addendum)
 Condition did worsen  Last tsh was still low so we lowered levothyroxine  to 100 mcg daily  Lab today  Is less anxious also  Reassuring exam   Will adj med further based on result   Call back and Er precautions noted in detail today

## 2024-05-07 NOTE — Progress Notes (Signed)
 Subjective:    Patient ID: Gabrielle Webb, female    DOB: 26-Apr-1941, 83 y.o.   MRN: [id number]  HPI  Wt Readings from Last 3 Encounters:  05/07/24 127 lb 8 oz (57.8 kg)  04/02/24 129 lb 2 oz (58.6 kg)  03/17/24 138 lb (62.6 kg)   22.95 kg/m  Vitals:   05/07/24 0937  BP: 126/74  Pulse: 73  Temp: 97.7 F (36.5 C)  SpO2: 100%    Pt presents for follow up of hypothyroidism/ mood and chronic health problems   Overall feeling some better    Hypothyroid   Lab Results  Component Value Date   TSH 0.06 (L) 04/20/2024  This was down further  We decreased dose to 100 mcg daily (was taking it intermittently with 112)  Due for lab today  Mood/anxiety  Struggles with post covid brain fog  Last visit encouraged to take lexapro  5 mg daily instead of prn  Offered counseling referral   Is feeling better  Took 5 mg last night and again this am and feeling better/pretty good !  Less jitteriness   Stress is high  Relationship issues   More time to take care of herself   Getting back into exercise  More walking  Socializing with some women    Has taken zoloft  in past  Afraid of prozac  Declined paxil-afraid of side effects       04/02/2024   12:04 PM 10/28/2023    9:26 AM 07/02/2023    9:01 AM 05/02/2023    8:27 AM 09/20/2022    8:49 AM  Depression screen PHQ 2/9  Decreased Interest 3 0 0 0 0  Down, Depressed, Hopeless 1 0 0 0 0  PHQ - 2 Score 4 0 0 0 0  Altered sleeping 0 0  0   Tired, decreased energy 2 3  0   Change in appetite 0 0  0   Feeling bad or failure about yourself  0 0  0   Trouble concentrating 0 0  0   Moving slowly or fidgety/restless 0 0  0   Suicidal thoughts 0 0  0   PHQ-9 Score 6 3  0   Difficult doing work/chores  Not difficult at all  Not difficult at all       04/02/2024   12:04 PM 05/02/2023    8:27 AM 06/09/2021   10:18 AM  GAD 7 : Generalized Anxiety Score  Nervous, Anxious, on Edge 1 0 3  Control/stop worrying 1 0 3  Worry too  much - different things 1 0 3  Trouble relaxing 0 0 3  Restless 0 0   Easily annoyed or irritable 0 0   Afraid - awful might happen 0 0   Total GAD 7 Score 3 0   Anxiety Difficulty Not difficult at all Not difficult at all Not difficult at all     Lab Results  Component Value Date   WBC 8.6 08/21/2023   HGB 12.6 08/21/2023   HCT 38.3 08/21/2023   MCV 91.4 08/21/2023   PLT 179.0 08/21/2023   Lab Results  Component Value Date   NA 143 02/07/2024   K 4.0 02/07/2024   CO2 27 02/07/2024   GLUCOSE 83 02/07/2024   BUN 18 02/07/2024   CREATININE 0.69 02/07/2024   CALCIUM  9.2 02/07/2024   GFR 81.79 02/07/2024   EGFR 82 (L) 07/03/2016   GFRNONAA >60 06/25/2023   Lab Results  Component Value Date  ALT 9 02/07/2024   AST 18 02/07/2024   ALKPHOS 51 02/07/2024   BILITOT 0.8 02/07/2024    Lab Results  Component Value Date   VITAMINB12 1,227 (H) 10/28/2023   Last vitamin D  Lab Results  Component Value Date   VD25OH 46.03 10/28/2023       Patient Active Problem List   Diagnosis Date Noted   Episode of confusion 10/28/2023   Memory change 10/02/2023   Left inguinal pain 08/21/2023   Normocytic anemia 07/09/2023   Post covid-19 condition, unspecified 07/05/2023   Dark stools 07/05/2023   Hypokalemia 06/30/2023   Skin lesion 05/02/2023   Right knee pain 04/24/2023   Vaginal pain 01/18/2023   Grief reaction 07/04/2022   Encounter for screening mammogram for breast cancer 12/18/2021   Anxious mood as adjustment reaction 06/09/2021   History of breast cancer 05/03/2020   Medicare annual wellness visit, subsequent 05/28/2018   Estrogen deficiency 05/28/2018   Fatigue 05/22/2017   S/P TAH-BSO 11/08/2016   Routine general medical examination at a health care facility 05/06/2016   Malignant neoplasm of upper-outer quadrant of right breast in female, estrogen receptor positive (HCC) 03/21/2015   History of tamoxifen  therapy 05/18/2014   Encounter for routine  gynecological examination 05/06/2013   Colon cancer screening 05/06/2013   Encounter for Medicare annual wellness exam 04/28/2013   Hx of radiation therapy    Hx of Breast cancer, stage 1, Right, UOQ, Receptor+,Her2- 05/29/2011    Class: Stage 1   Other screening mammogram 03/21/2011   Post-menopausal 03/21/2011   Hypothyroidism 01/23/2008   Pure hypercholesterolemia 01/23/2008   Allergic rhinitis 01/23/2008   GERD 01/23/2008   Diverticulosis of colon 01/23/2008   IBS 01/23/2008   FIBROCYSTIC BREAST DISEASE 01/23/2008   Osteoporosis 10/17/2007   Past Medical History:  Diagnosis Date   Allergy    Anxiety    Breast cancer (HCC) 7/12   Right   Cellulitis    Diverticulosis of colon    GERD (gastroesophageal reflux disease)    Headache    sinus   History of hiatal hernia    Hx of radiation therapy 06/20/11 - 07/11/11   right breast   Hypothyroid    Osteoporosis    Personal history of radiation therapy 2012   Right Breast Cancer   PONV (postoperative nausea and vomiting)    Skin cancer    basal and squamous cell   Vertigo    Past Surgical History:  Procedure Laterality Date   ABDOMINAL HYSTERECTOMY N/A 11/08/2016   Procedure: HYSTERECTOMY TOTAL  ABDOMINAL;  Surgeon: Thurman Flores, MD;  Location: [facility];  Service: Gynecology;  Laterality: N/A;   APPENDECTOMY  07/2007   back sugery  1970   BREAST BIOPSY Left    Benign   BREAST EXCISIONAL BIOPSY Left 1983   BREAST EXCISIONAL BIOPSY Left 1983   BREAST LUMPECTOMY Right 05/16/2011   COLONOSCOPY     COLONOSCOPY WITH PROPOFOL  N/A 08/27/2018   Procedure: COLONOSCOPY WITH PROPOFOL ;  Surgeon: Cassie Click, MD;  Location: [facility];  Service: Endoscopy;  Laterality: N/A;   COLONOSCOPY WITH PROPOFOL  N/A 08/16/2021   Procedure: COLONOSCOPY WITH PROPOFOL ;  Surgeon: Marshall Skeeter, MD;  Location: [facility] ENDOSCOPY;  Service: Endoscopy;  Laterality: N/A;   DIAGNOSTIC LAPAROSCOPY     DILATION AND CURETTAGE OF UTERUS N/A  05/31/2014   Procedure: DILATATION AND CURETTAGE WITH ULTRASOUND GUIDANCE;  Surgeon: Martine Sleek, MD;  Location: [facility];  Service: Gynecology;  Laterality: N/A;   ESOPHAGOGASTRODUODENOSCOPY  04/20/2003   KNEE SURGERY  1962   MASTECTOMY     right eye surgery     SALPINGOOPHORECTOMY Bilateral 11/08/2016   Procedure: BILATERAL SALPINGO OOPHORECTOMY;  Surgeon: Thurman Flores, MD;  Location: [facility];  Service: Gynecology;  Laterality: Bilateral;   UPPER GI ENDOSCOPY     Social History   Tobacco Use   Smoking status: Never   Smokeless tobacco: Never  Vaping Use   Vaping status: Never Used  Substance Use Topics   Alcohol use: No    Alcohol/week: 0.0 standard drinks of alcohol   Drug use: No   Family History  Problem Relation Age of Onset   Atrial fibrillation Mother    Coronary artery disease Mother    Congenital heart disease Mother    Heart attack Father 50   Hypertension Father    Diabetes Maternal Aunt    Cancer Maternal Aunt        leukemia   Diabetes Maternal Uncle    Cancer Maternal Uncle        colon   Diabetes Other        Grandmother   Coronary artery disease Other        Grandmother   Uterine cancer Other        Grandmother   Leukemia Other        Aunt   Allergies  Allergen Reactions   Codeine Nausea And Vomiting   Fentanyl  Nausea And Vomiting   Hydromorphone  Nausea And Vomiting        Morphine Nausea And Vomiting   Ciprofloxacin  Other (See Comments)    Intolerant- tingling  ciprofloxacin    Tape     Surgical tape   Prednisone Palpitations    Rapid hear beat   Current Outpatient Medications on File Prior to Visit  Medication Sig Dispense Refill   aspirin EC 81 MG tablet Take 81 mg by mouth daily.     atorvastatin  (LIPITOR) 10 MG tablet TAKE 1 TABLET BY MOUTH DAILY 90 tablet 0   BIOTIN PO Take by mouth daily.     CALCIUM  PO Take 1,500 mg by mouth daily.     carboxymethylcellulose (REFRESH PLUS) 0.5 % SOLN 1 drop 3 (three) times daily as  needed.     chlorthalidone  (HYGROTON ) 25 MG tablet TAKE TWO TABLETS EVERY DAY AS NEEDED FORSWELLING 180 tablet 0   Cholecalciferol (VITAMIN D ) 2000 UNITS CAPS Take 1 capsule by mouth daily.     Cyanocobalamin  (B-12 PO) Take 2,500 mcg by mouth daily.     denosumab  (PROLIA ) 60 MG/ML SOSY injection Inject 60 mg into the skin every 6 (six) months. 1 mL 0   diclofenac  sodium (VOLTAREN ) 1 % GEL Apply 2 g topically 4 (four) times daily. Rub into affected area of foot 2 to 4 times daily (Patient taking differently: Apply 2 g topically as needed. Rub into affected area of foot 2 to 4 times daily) 100 g 2   dicyclomine  (BENTYL ) 10 MG capsule TAKE 1 CAPSULE BY MOUTH TWICE DAILY FOR SPASMS 180 capsule 2   escitalopram  (LEXAPRO ) 5 MG tablet TAKE 1 TABLET BY MOUTH DAILY (Patient taking differently: Take 10 mg by mouth daily.) 90 tablet 1   famotidine  (PEPCID ) 20 MG tablet TAKE ONE TABLET EVERY DAY AS NEEDED FOR HEARTBURN OR INDIGESTION 90 tablet 3   Flaxseed, Linseed, (FLAX SEED OIL PO) Take by mouth. 2 a day     fluticasone  (FLONASE ) 50 MCG/ACT nasal spray SPRAY TWICE INTO EACH NOSTRIL ONCE DAILY  16 g 3   levothyroxine  (SYNTHROID ) 100 MCG tablet Take 1 tablet (100 mcg total) by mouth every other day. Alternate with 112 mcg dose 45 tablet 1   Multiple Vitamin (MULTIVITAMIN) capsule Take 1 capsule by mouth daily.     Propylene Glycol (SYSTANE COMPLETE OP) Apply to eye.     Current Facility-Administered Medications on File Prior to Visit  Medication Dose Route Frequency Provider Last Rate Last Admin   denosumab  (PROLIA ) injection 60 mg  60 mg Subcutaneous Once Dantrell Schertzer, Manley Seeds, MD       [START ON 06/01/2024] denosumab  (PROLIA ) injection 60 mg  60 mg Subcutaneous Once Jesse Hirst, Mitchell Ana A, MD        Review of Systems  Constitutional:  Negative for activity change, appetite change, fatigue, fever and unexpected weight change.  HENT:  Negative for congestion, ear pain, rhinorrhea, sinus pressure and sore throat.   Eyes:   Negative for pain, redness and visual disturbance.  Respiratory:  Negative for cough, shortness of breath and wheezing.   Cardiovascular:  Negative for chest pain and palpitations.  Gastrointestinal:  Negative for abdominal pain, blood in stool, constipation and diarrhea.  Endocrine: Negative for polydipsia and polyuria.  Genitourinary:  Negative for dysuria, frequency and urgency.  Musculoskeletal:  Negative for arthralgias, back pain and myalgias.  Skin:  Negative for pallor and rash.  Allergic/Immunologic: Negative for environmental allergies.  Neurological:  Negative for dizziness, syncope and headaches.  Hematological:  Negative for adenopathy. Does not bruise/bleed easily.  Psychiatric/Behavioral:  Positive for decreased concentration. Negative for dysphoric mood and suicidal ideas. The patient is nervous/anxious.        Sleeping better with lexapro  so far  Brain fog is improving  Still some word searching        Objective:   Physical Exam Constitutional:      General: She is not in acute distress.    Appearance: Normal appearance. She is well-developed and normal weight. She is not ill-appearing or diaphoretic.  HENT:     Head: Normocephalic and atraumatic.     Mouth/Throat:     Mouth: Mucous membranes are moist.  Eyes:     Conjunctiva/sclera: Conjunctivae normal.     Pupils: Pupils are equal, round, and reactive to light.  Neck:     Thyroid : No thyromegaly.     Vascular: No carotid bruit or JVD.  Cardiovascular:     Rate and Rhythm: Normal rate and regular rhythm.     Heart sounds: Normal heart sounds.     No gallop.  Pulmonary:     Effort: Pulmonary effort is normal. No respiratory distress.     Breath sounds: Normal breath sounds. No wheezing or rales.  Abdominal:     General: There is no distension or abdominal bruit.     Palpations: Abdomen is soft.  Musculoskeletal:     Cervical back: Normal range of motion and neck supple.     Right lower leg: No edema.      Left lower leg: No edema.  Lymphadenopathy:     Cervical: No cervical adenopathy.  Skin:    General: Skin is warm and dry.     Coloration: Skin is not pale.     Findings: No rash.  Neurological:     Mental Status: She is alert.     Cranial Nerves: No cranial nerve deficit.     Motor: No weakness or tremor.     Coordination: Coordination normal.     Deep Tendon Reflexes:  Reflexes are normal and symmetric. Reflexes normal.  Psychiatric:        Attention and Perception: Attention normal.        Mood and Affect: Mood normal.        Speech: Speech normal.        Cognition and Memory: Cognition normal.     Comments: Mood is improved  More calm   Candidly discusses symptoms and stressors   More mentally sharp today              Assessment & Plan:   Problem List Items Addressed This Visit       Endocrine   Hypothyroidism - Primary   Condition did worsen  Last tsh was still low so we lowered levothyroxine  to 100 mcg daily  Lab today  Is less anxious also  Reassuring exam   Will adj med further based on result   Call back and Er precautions noted in detail today        Relevant Orders   TSH     Other   Anxious mood as adjustment reaction   Overall improved with lexapro  5 mg daily (instead of prn) Pt took an extra this am (5 mg last night and then 5 mg this am) and feels even better She may be ready for 10 mg dose Will decide if she wants to take 5 mg bid or 10 mg once daily  No side effects  Discussed expectations of SSRI medication including time to effectiveness and mechanism of action, also poss of side effects (early and late)- including mental fuzziness, weight or appetite change, nausea and poss of worse dep or anxiety (even suicidal thoughts)  Pt voiced understanding and will stop med and update if this occurs    Will continue to monitor  Suspect brain fog will continue to improve also   Reviewed stressors/ coping techniques/symptoms/ support sources/  tx options and side effects in detail today Given handout on managing anxiety   Call back and Er precautions noted in detail today

## 2024-05-08 NOTE — Telephone Encounter (Signed)
 Pt notified of lab results and Dr. Belva Boyden comments, f/u lab appt scheduled in July. Pt did state she is very concerned about why her thyroid  is still really low and wants to see what Dr. Malissa Se thinks is going on. I did verify that even though Biotin is on her med list she isn't taking it. Pt said she hasn't been on Biotin in years so removed from list. Pt wants to speak with Dr. Malissa Se if possible for her to explain why her thyroid  level has been off recently

## 2024-05-08 NOTE — Telephone Encounter (Signed)
-----   Message from [place] sent at 05/07/2024  6:06 PM EDT ----- Your TSH is still low , so I need to go down on your levothyroxine  dose again from 100 to 75 mcg   I sent this to your pharmacy  If any problems let me know  Let's re check TSH in 6-8 weeks  The order is in

## 2024-05-11 ENCOUNTER — Telehealth: Payer: Self-pay

## 2024-05-11 ENCOUNTER — Other Ambulatory Visit: Payer: Self-pay

## 2024-05-11 ENCOUNTER — Other Ambulatory Visit: Payer: Self-pay | Admitting: *Deleted

## 2024-05-11 ENCOUNTER — Telehealth: Payer: Self-pay | Admitting: Family Medicine

## 2024-05-11 DIAGNOSIS — M810 Age-related osteoporosis without current pathological fracture: Secondary | ICD-10-CM

## 2024-05-11 MED ORDER — PROLIA 60 MG/ML ~~LOC~~ SOSY
60.0000 mg | PREFILLED_SYRINGE | SUBCUTANEOUS | 0 refills | Status: DC
  Filled 2024-05-11 – 2024-05-22 (×2): qty 1, 180d supply, fill #0

## 2024-05-11 NOTE — Telephone Encounter (Signed)
 Copied from CRM [id number]. Topic: Clinical - Medication Question >> May 11, 2024 11:53 AM Gabrielle Webb wrote: Reason for CRM: patient called in requesting her 6 month prolia  shot that's already been scheduled for 07/01

## 2024-05-11 NOTE — Telephone Encounter (Signed)
 Copied from CRM [id number]. Topic: Clinical - Medical Advice >> May 11, 2024  1:25 PM [place] E wrote: Reason for CRM: Patient called wanting to speak with PCP's nurse in regards to thyroid  regulation. Callback number for patient is [phone].

## 2024-05-11 NOTE — Telephone Encounter (Signed)
 Rx has been sent to [facility].

## 2024-05-11 NOTE — Telephone Encounter (Signed)
 LM for pt to returncall

## 2024-05-11 NOTE — Addendum Note (Signed)
 Addended by: Dewanda Foots C on: 05/11/2024 02:58 PM   Modules accepted: Orders

## 2024-05-11 NOTE — Telephone Encounter (Signed)
 LM for pt to return call.   Pt needs to be told what her out of pocket cost is going to be and we also need to schedule her labs prior to injection.

## 2024-05-11 NOTE — Telephone Encounter (Signed)
 Called patient reviewed all cost information. She would like to have sent to pharmacy. At her request I have sent my chart message with all appointment and payment information for her records. [facility] not [facility]. Will send message to Gabrielle Webb not sure where she is in her process.

## 2024-05-11 NOTE — Addendum Note (Signed)
 Addended by: Deri Fleet A on: 05/11/2024 08:14 PM   Modules accepted: Orders

## 2024-05-11 NOTE — Telephone Encounter (Signed)
 Copied from CRM [id number]. Topic: Clinical - Medication Question >> May 11, 2024 12:42 PM [name] wrote: Reason for CRM: Patient is requesting to pick up the Prolia  shot in the pharmacy which is the cheaper option is and is requesting a script be sent. Patient is requesting a call back at [phone].

## 2024-05-12 NOTE — Telephone Encounter (Signed)
 Pt sent mychart with questions. Mychart message routed to PCP

## 2024-05-13 NOTE — Telephone Encounter (Signed)
 Copied from CRM [id number]. Topic: Clinical - Medical Advice >> May 13, 2024  8:02 AM Rosamond Comes wrote: Reason for CRM: patient calling would like to talk to Dr Belva Boyden nurse regarding going to endocrinologist Dr Tyron Gallon  patient would like to know if she can schedule the appt.   Please call patient [phone]

## 2024-05-13 NOTE — Telephone Encounter (Signed)
 I responded to a result note-she already has appointment at [facility] -I think that will be ok

## 2024-05-14 ENCOUNTER — Other Ambulatory Visit (INDEPENDENT_AMBULATORY_CARE_PROVIDER_SITE_OTHER)

## 2024-05-14 DIAGNOSIS — M810 Age-related osteoporosis without current pathological fracture: Secondary | ICD-10-CM

## 2024-05-14 DIAGNOSIS — E039 Hypothyroidism, unspecified: Secondary | ICD-10-CM

## 2024-05-14 NOTE — Addendum Note (Signed)
 Addended by: Gerry Krone on: 05/14/2024 09:52 AM   Modules accepted: Orders

## 2024-05-15 ENCOUNTER — Ambulatory Visit: Payer: Self-pay | Admitting: Family Medicine

## 2024-05-15 LAB — BASIC METABOLIC PANEL WITH GFR
BUN: 13 mg/dL (ref 7–25)
CO2: 26 mmol/L (ref 20–32)
Calcium: 9.3 mg/dL (ref 8.6–10.4)
Chloride: 105 mmol/L (ref 98–110)
Creat: 0.7 mg/dL (ref 0.60–0.95)
Glucose, Bld: 88 mg/dL (ref 65–99)
Potassium: 4 mmol/L (ref 3.5–5.3)
Sodium: 140 mmol/L (ref 135–146)
eGFR: 87 mL/min/{1.73_m2} (ref >=60)

## 2024-05-15 LAB — TSH: TSH: 0.05 m[IU]/L — ABNORMAL LOW (ref 0.40–4.50)

## 2024-05-21 ENCOUNTER — Ambulatory Visit
Admission: RE | Admit: 2024-05-21 | Discharge: 2024-05-21 | Disposition: A | Discharge status: Home / Self Care | Attending: Emergency Medicine | Admitting: Emergency Medicine

## 2024-05-21 ENCOUNTER — Telehealth: Payer: Self-pay

## 2024-05-21 ENCOUNTER — Ambulatory Visit
Admission: EM | Admit: 2024-05-21 | Discharge: 2024-05-21 | Disposition: A | Discharge status: Home / Self Care | Attending: Emergency Medicine | Admitting: Emergency Medicine

## 2024-05-21 ENCOUNTER — Ambulatory Visit
Admission: RE | Admit: 2024-05-21 | Discharge: 2024-05-21 | Disposition: A | Discharge status: Home / Self Care | Source: Ambulatory Visit | Attending: Emergency Medicine

## 2024-05-21 DIAGNOSIS — R319 Hematuria, unspecified: Secondary | ICD-10-CM | POA: Insufficient documentation

## 2024-05-21 DIAGNOSIS — M25532 Pain in left wrist: Secondary | ICD-10-CM | POA: Insufficient documentation

## 2024-05-21 DIAGNOSIS — N390 Urinary tract infection, site not specified: Secondary | ICD-10-CM | POA: Diagnosis not present

## 2024-05-21 DIAGNOSIS — M19032 Primary osteoarthritis, left wrist: Secondary | ICD-10-CM | POA: Diagnosis not present

## 2024-05-21 DIAGNOSIS — M7989 Other specified soft tissue disorders: Secondary | ICD-10-CM | POA: Diagnosis not present

## 2024-05-21 LAB — POCT URINALYSIS DIP (MANUAL ENTRY)
Bilirubin, UA: NEGATIVE
Glucose, UA: NEGATIVE mg/dL
Ketones, POC UA: NEGATIVE mg/dL
Nitrite, UA: NEGATIVE
Protein Ur, POC: >=300 mg/dL — AB
Spec Grav, UA: 1.025 (ref 1.010–1.025)
Urobilinogen, UA: 1 U/dL
pH, UA: 6 (ref 5.0–8.0)

## 2024-05-21 MED ORDER — CEPHALEXIN 500 MG PO CAPS: 500.0000 mg | ORAL_CAPSULE | Freq: Three times a day (TID) | ORAL | 0 refills | Status: AC

## 2024-05-21 NOTE — Discharge Instructions (Addendum)
 Take the antibiotic as directed.  The urine culture is pending.  We will call you if it shows the need to change or discontinue your antibiotic.  Follow up with your primary care provider.    Go to [facility] for your x-ray:   [address]  [address] [place], [place] [address] [phone]  Wear the wrist splint as directed.  Rest and elevate your wrist.  Take Tylenol  or ibuprofen  as needed for discomfort.  Apply ice packs as directed.  Follow-up with an orthopedist such as the 1 listed below.

## 2024-05-21 NOTE — ED Provider Notes (Signed)
 Gabrielle Webb    CSN: [id number] Arrival date & time: 05/21/24  8295      History   Chief Complaint Chief Complaint  Patient presents with   Dysuria   Hematuria    HPI Gabrielle Webb is a 83 y.o. female.  Patient presents with dysuria and hematuria since yesterday.  She reports lower abdominal discomfort.  No OTC medications taken.  No fever, flank pain, nausea, vomiting, diarrhea, constipation.  Patient also presents with left wrist pain and swelling which started yesterday when she was lifting 2 chairs to take over to a neighbor's house.  She thinks she broke her wrist.  No open wounds, redness, numbness, weakness.  No treatments at home.  The history is provided by the patient and medical records.    Past Medical History:  Diagnosis Date   Allergy    Anxiety    Breast cancer (HCC) 7/12   Right   Cellulitis    Diverticulosis of colon    GERD (gastroesophageal reflux disease)    Headache    sinus   History of hiatal hernia    Hx of radiation therapy 06/20/11 - 07/11/11   right breast   Hypothyroid    Osteoporosis    Personal history of radiation therapy 2012   Right Breast Cancer   PONV (postoperative nausea and vomiting)    Skin cancer    basal and squamous cell   Vertigo     Patient Active Problem List   Diagnosis Date Noted   Episode of confusion 10/28/2023   Memory change 10/02/2023   Left inguinal pain 08/21/2023   Normocytic anemia 07/09/2023   Post covid-19 condition, unspecified 07/05/2023   Dark stools 07/05/2023   Hypokalemia 06/30/2023   Skin lesion 05/02/2023   Right knee pain 04/24/2023   Vaginal pain 01/18/2023   Grief reaction 07/04/2022   Encounter for screening mammogram for breast cancer 12/18/2021   Anxious mood as adjustment reaction 06/09/2021   History of breast cancer 05/03/2020   Medicare annual wellness visit, subsequent 05/28/2018   Estrogen deficiency 05/28/2018   Fatigue 05/22/2017   S/P TAH-BSO 11/08/2016    Routine general medical examination at a health care facility 05/06/2016   Malignant neoplasm of upper-outer quadrant of right breast in female, estrogen receptor positive (HCC) 03/21/2015   History of tamoxifen  therapy 05/18/2014   Encounter for routine gynecological examination 05/06/2013   Colon cancer screening 05/06/2013   Encounter for Medicare annual wellness exam 04/28/2013   Hx of radiation therapy    Hx of Breast cancer, stage 1, Right, UOQ, Receptor+,Her2- 05/29/2011    Class: Stage 1   Other screening mammogram 03/21/2011   Post-menopausal 03/21/2011   Hypothyroidism 01/23/2008   Pure hypercholesterolemia 01/23/2008   Allergic rhinitis 01/23/2008   GERD 01/23/2008   Diverticulosis of colon 01/23/2008   IBS 01/23/2008   FIBROCYSTIC BREAST DISEASE 01/23/2008   Osteoporosis 10/17/2007    Past Surgical History:  Procedure Laterality Date   ABDOMINAL HYSTERECTOMY N/A 11/08/2016   Procedure: HYSTERECTOMY TOTAL  ABDOMINAL;  Surgeon: Thurman Flores, MD;  Location: [facility];  Service: Gynecology;  Laterality: N/A;   APPENDECTOMY  07/2007   back sugery  1970   BREAST BIOPSY Left    Benign   BREAST EXCISIONAL BIOPSY Left 1983   BREAST EXCISIONAL BIOPSY Left 1983   BREAST LUMPECTOMY Right 05/16/2011   COLONOSCOPY     COLONOSCOPY WITH PROPOFOL  N/A 08/27/2018   Procedure: COLONOSCOPY WITH PROPOFOL ;  Surgeon: Cassie Click,  MD;  Location: [facility];  Service: Endoscopy;  Laterality: N/A;   COLONOSCOPY WITH PROPOFOL  N/A 08/16/2021   Procedure: COLONOSCOPY WITH PROPOFOL ;  Surgeon: Marshall Skeeter, MD;  Location: [facility];  Service: Endoscopy;  Laterality: N/A;   DIAGNOSTIC LAPAROSCOPY     DILATION AND CURETTAGE OF UTERUS N/A 05/31/2014   Procedure: DILATATION AND CURETTAGE WITH ULTRASOUND GUIDANCE;  Surgeon: Martine Sleek, MD;  Location: [facility];  Service: Gynecology;  Laterality: N/A;   ESOPHAGOGASTRODUODENOSCOPY  04/20/2003   KNEE SURGERY  1962   MASTECTOMY      right eye surgery     SALPINGOOPHORECTOMY Bilateral 11/08/2016   Procedure: BILATERAL SALPINGO OOPHORECTOMY;  Surgeon: Thurman Flores, MD;  Location: [facility];  Service: Gynecology;  Laterality: Bilateral;   UPPER GI ENDOSCOPY      OB History   No obstetric history on file.      Home Medications    Prior to Admission medications   Medication Sig Start Date End Date Taking? Authorizing Provider  cephALEXin  (KEFLEX ) 500 MG capsule Take 1 capsule (500 mg total) by mouth 3 (three) times daily for 5 days. 05/21/24 05/26/24 Yes Wellington Half, NP  aspirin EC 81 MG tablet Take 81 mg by mouth daily.    [provider]  atorvastatin  (LIPITOR) 10 MG tablet TAKE 1 TABLET BY MOUTH DAILY 05/03/23   Tower, Manley Seeds, MD  CALCIUM  PO Take 1,500 mg by mouth daily.    [provider]  carboxymethylcellulose (REFRESH PLUS) 0.5 % SOLN 1 drop 3 (three) times daily as needed.    [provider]  chlorthalidone  (HYGROTON ) 25 MG tablet TAKE TWO TABLETS EVERY DAY AS NEEDED FORSWELLING 06/28/21   Tower, Manley Seeds, MD  Cholecalciferol (VITAMIN D ) 2000 UNITS CAPS Take 1 capsule by mouth daily.    [provider]  Cyanocobalamin  (B-12 PO) Take 2,500 mcg by mouth daily.    [provider]  denosumab  (PROLIA ) 60 MG/ML SOSY injection Inject 60 mg into the skin every 6 (six) months. 05/11/24   Tower, Manley Seeds, MD  diclofenac  sodium (VOLTAREN ) 1 % GEL Apply 2 g topically 4 (four) times daily. Rub into affected area of foot 2 to 4 times daily Patient taking differently: Apply 2 g topically as needed. Rub into affected area of foot 2 to 4 times daily 12/21/14   Charity Conch, DPM  dicyclomine  (BENTYL ) 10 MG capsule TAKE 1 CAPSULE BY MOUTH TWICE DAILY FOR SPASMS 03/13/23   Tower, Manley Seeds, MD  escitalopram  (LEXAPRO ) 5 MG tablet TAKE 1 TABLET BY MOUTH DAILY Patient taking differently: Take 10 mg by mouth daily. 03/30/24   Tower, Manley Seeds, MD  famotidine  (PEPCID ) 20 MG tablet TAKE ONE  TABLET EVERY DAY AS NEEDED FOR HEARTBURN OR INDIGESTION 07/25/22   Tower, Manley Seeds, MD  Flaxseed, Linseed, (FLAX SEED OIL PO) Take by mouth. 2 a day    [provider]  fluticasone  (FLONASE ) 50 MCG/ACT nasal spray SPRAY TWICE INTO EACH NOSTRIL ONCE DAILY 03/03/24   Tower, Manley Seeds, MD  levothyroxine  (SYNTHROID ) 75 MCG tablet Take 1 tablet (75 mcg total) by mouth daily. 05/07/24   Tower, Manley Seeds, MD  Multiple Vitamin (MULTIVITAMIN) capsule Take 1 capsule by mouth daily.    [provider]  Propylene Glycol (SYSTANE COMPLETE OP) Apply to eye.    [provider]    Family History Family History  Problem Relation Age of Onset   Atrial fibrillation Mother    Coronary artery  disease Mother    Congenital heart disease Mother    Heart attack Father 33   Hypertension Father    Diabetes Maternal Aunt    Cancer Maternal Aunt        leukemia   Diabetes Maternal Uncle    Cancer Maternal Uncle        colon   Diabetes Other        Grandmother   Coronary artery disease Other        Grandmother   Uterine cancer Other        Grandmother   Leukemia Other        Aunt    Social History Social History   Tobacco Use   Smoking status: Never   Smokeless tobacco: Never  Vaping Use   Vaping status: Never Used  Substance Use Topics   Alcohol use: No    Alcohol/week: 0.0 standard drinks of alcohol   Drug use: No     Allergies   Codeine, Fentanyl , Hydromorphone , Morphine, Ciprofloxacin , Tape, and Prednisone   Review of Systems Review of Systems  Constitutional:  Negative for chills and fever.  Gastrointestinal:  Positive for abdominal pain. Negative for blood in stool, constipation, diarrhea, nausea and vomiting.  Genitourinary:  Positive for dysuria and hematuria. Negative for flank pain.  Musculoskeletal:  Positive for arthralgias and joint swelling.  Skin:  Positive for color change. Negative for rash and wound.  Neurological:  Negative for weakness and numbness.      Physical Exam Triage Vital Signs ED Triage Vitals  Encounter Vitals Group     BP 05/21/24 0841 123/84     Girls Systolic BP Percentile --      Girls Diastolic BP Percentile --      Boys Systolic BP Percentile --      Boys Diastolic BP Percentile --      Pulse Rate 05/21/24 0841 100     Resp 05/21/24 0841 16     Temp 05/21/24 0841 98 F (36.7 C)     Temp Source 05/21/24 0841 Oral     SpO2 05/21/24 0841 99 %     Weight --      Height --      Head Circumference --      Peak Flow --      Pain Score 05/21/24 0844 8     Pain Loc --      Pain Education --      Exclude from Growth Chart --    No data found.  Updated Vital Signs BP 123/84 (BP Location: Left Arm)   Pulse 100   Temp 98 F (36.7 C) (Oral)   Resp 16   LMP 12/03/1988   SpO2 99%   Visual Acuity Right Eye Distance:   Left Eye Distance:   Bilateral Distance:    Right Eye Near:   Left Eye Near:    Bilateral Near:     Physical Exam Constitutional:      General: She is not in acute distress. HENT:     Mouth/Throat:     Mouth: Mucous membranes are moist.   Cardiovascular:     Rate and Rhythm: Normal rate and regular rhythm.     Heart sounds: Normal heart sounds.  Pulmonary:     Effort: Pulmonary effort is normal. No respiratory distress.     Breath sounds: Normal breath sounds.  Abdominal:     Palpations: Abdomen is soft.     Tenderness: There is no abdominal tenderness.  There is no right CVA tenderness, left CVA tenderness, guarding or rebound.   Musculoskeletal:        General: Swelling and tenderness present. No deformity. Normal range of motion.       Arms:     Comments: LUE: 2+ radial pulse, FROM, sensation intact.    Skin:    General: Skin is warm and dry.     Findings: Bruising present. No erythema, lesion or rash.   Neurological:     General: No focal deficit present.     Mental Status: She is alert.     Sensory: No sensory deficit.     Motor: No weakness.      UC Treatments  / Results  Labs (all labs ordered are listed, but only abnormal results are displayed) Labs Reviewed  POCT URINALYSIS DIP (MANUAL ENTRY) - Abnormal; Notable for the following components:      Result Value   Color, UA straw (*)    Clarity, UA cloudy (*)    Blood, UA large (*)    Protein Ur, POC >=300 (*)    Leukocytes, UA Large (3+) (*)    All other components within normal limits  URINE CULTURE    EKG   Radiology DG Wrist Complete Left Result Date: 05/21/2024 CLINICAL DATA:  Left wrist pain and swelling after lifting along chair. History of breast cancer. EXAM: LEFT WRIST - COMPLETE 3+ VIEW COMPARISON:  None Available. FINDINGS: Diffuse osseous demineralization. No acute fracture or dislocation. Relative sclerosis of the distal radial diaphysis extending approximately 3 cm with mild cortical thickening is nonspecific. No evidence of periosteal reactive changes or cortical breakthrough. Carpal bones are intact and demonstrate normal alignment. Chondrocalcinosis in the region of the TFCC. Moderate first CMC joint space narrowing and subchondral sclerosis. Mild radiocarpal joint space narrowing. IMPRESSION: 1. No acute osseous abnormality. 2. Relative sclerosis of the distal radial diaphysis extending approximately 3 cm without periosteal reaction or cortical breakthrough. These findings are nonspecific and raise concern for possible metastatic disease given history of prior malignancy versus stress related remodeling in the setting of diffuse osseous demineralization. Consider further evaluation with contrast-enhanced MRI of the wrist. Correlation with bone scan and/or prior imaging may also be beneficial. 3. Degenerative changes of the wrist including moderate osteoarthritis of the first [facility] joint and chondrocalcinosis in the region of the TFCC. Electronically Signed   By: Mannie Seek M.D.   On: 05/21/2024 10:31    Procedures Procedures (including critical care time)  Medications  Ordered in UC Medications - No data to display  Initial Impression / Assessment and Plan / UC Course  I have reviewed the triage vital signs and the nursing notes.  Pertinent labs & imaging results that were available during my care of the patient were reviewed by me and considered in my medical decision making (see chart for details).    UTI with hematuria.  Left wrist pain.  Afebrile and vital signs are stable.  Treating UTI with cephalexin .  Urine culture pending.  Discussed with patient that we will call her if the culture shows the need to change her antibiotic.  Education provided on UTI.  Instructed her to follow-up with her PCP.  Xray of left wrist negative for acute bony abnormality but does show concern for relative sclerosis of the distal radial diaphysis and, given the patient's history of cancer, the radiologist recommends possible follow-up imaging.  Discussed xray results and recommendation of radiologist with patient.  She will call her PCP  this afternoon to discuss this finding.  Treating wrist pain with wrist splint, rest, elevation, ice packs, Tylenol  or ibuprofen .  Instructed patient to follow-up with an orthopedist.  Contact information for on-call Ortho provided.  Education provided on wrist pain.  Patient agrees to plan of care.  Final Clinical Impressions(s) / UC Diagnoses   Final diagnoses:  Urinary tract infection with hematuria, site unspecified  Left wrist pain     Discharge Instructions      Take the antibiotic as directed.  The urine culture is pending.  We will call you if it shows the need to change or discontinue your antibiotic.  Follow up with your primary care provider.    Go to [facility] for your x-ray:   [address]  [address] [place], [place] [address] [phone]  Wear the wrist splint as directed.  Rest and elevate your wrist.  Take Tylenol  or ibuprofen  as needed for discomfort.  Apply ice packs as  directed.  Follow-up with an orthopedist such as the 1 listed below.       ED Prescriptions     Medication Sig Dispense Auth. Provider   cephALEXin  (KEFLEX ) 500 MG capsule Take 1 capsule (500 mg total) by mouth 3 (three) times daily for 5 days. 15 capsule Wellington Half, NP      PDMP not reviewed this encounter.   Wellington Half, NP 05/21/24 1122

## 2024-05-21 NOTE — Telephone Encounter (Signed)
 Copied from CRM [id number]. Topic: Clinical - Lab/Test Results >> May 21, 2024  4:27 PM Fonda T wrote: Reason for CRM: Patient calling requesting results from imaging results of left wrist. Imaging performed today, 05/21/2024.  Patient requests a return phone call to discuss results further, and to obtain a copy of results as she states she is unable to view results via Mychart. Patient given Mychart Help desk number [phone].  Patient can be reached at [phone] to discuss results further.

## 2024-05-21 NOTE — Telephone Encounter (Signed)
 Copied from CRM [id number]. Topic: Clinical - Lab/Test Results >> May 21, 2024 11:32 AM Baldo Levan wrote: Reason for CRM: Patient is calling in due to having a x ray this morning and they called her stating it shows some concerns, this was order by urgent care. When they called her to let her know about the spot they recommended a MRI but she would have to have her PCP order it. They did not give her the actual results just stated she has a spot of concern. Patient would like to have the MRI as soon as possible and also speak to Dr. Al Alias.

## 2024-05-21 NOTE — ED Triage Notes (Signed)
 Pt c/o pain w/urination & hematuria x1 day. No OTC meds.

## 2024-05-22 ENCOUNTER — Other Ambulatory Visit: Payer: Self-pay

## 2024-05-22 ENCOUNTER — Ambulatory Visit (HOSPITAL_COMMUNITY): Payer: Self-pay

## 2024-05-22 ENCOUNTER — Telehealth: Payer: Self-pay | Admitting: Family Medicine

## 2024-05-22 ENCOUNTER — Other Ambulatory Visit: Payer: Self-pay | Admitting: Pharmacy Technician

## 2024-05-22 ENCOUNTER — Other Ambulatory Visit (HOSPITAL_COMMUNITY): Payer: Self-pay

## 2024-05-22 DIAGNOSIS — M25532 Pain in left wrist: Secondary | ICD-10-CM | POA: Insufficient documentation

## 2024-05-22 DIAGNOSIS — R936 Abnormal findings on diagnostic imaging of limbs: Secondary | ICD-10-CM | POA: Insufficient documentation

## 2024-05-22 NOTE — Telephone Encounter (Signed)
 Please let her know I am seeing patients- will not have time to look at it until after hours Sending back to myself

## 2024-05-22 NOTE — Telephone Encounter (Signed)
 Pt notified Dr. Malissa Se will f/u after seeing pt. Pt is concerned because UC did f/u with pt and advised she needs further imaging and possible cancer, and to f/u with PCP. Pt will await Dr. Belva Boyden call.  **pt is asking if PCP can call her directly due to her concerns and questions related to xray

## 2024-05-22 NOTE — Telephone Encounter (Signed)
 Please see xray from UC pt went to yesterday

## 2024-05-22 NOTE — Telephone Encounter (Signed)
 MRI ordered urgently for abnormality of wrist  Follow up planned with me for monday

## 2024-05-22 NOTE — Progress Notes (Signed)
 Specialty Pharmacy Refill Coordination Note  Gabrielle Webb is a 83 y.o. female contacted today regarding refills of specialty medication(s) Denosumab  (Prolia )   Patient requested Courier to Provider Office   Delivery date: 05/25/24   Verified address: [facility] at [facility],   Medication will be filled on 05/22/24.

## 2024-05-22 NOTE — Telephone Encounter (Signed)
 Unsure what this is and it is nothing I can really clarify  I put in urgent order for MRI  Please schedule follow up with me

## 2024-05-22 NOTE — Telephone Encounter (Signed)
 Patient is needing a call back regarding her scan she had done on her wrist she is concerned and wants to know her next steps she needs to take

## 2024-05-22 NOTE — Telephone Encounter (Signed)
 Pt notified of Dr. Belva Boyden comments, will alert the Stat referral dpt on Monday. F/u schedule with PCP

## 2024-05-23 LAB — URINE CULTURE: Culture: 80000 — AB

## 2024-05-24 NOTE — Telephone Encounter (Signed)
 Spoke to pt and reviewed findings  Will hope for MRI soon - pt will keep appointment at 12:30 Monday and will take a look at her wrist regardless  Uti symptoms are better Wrist is about the same-using ace bandage instead of wrap given because it is more comfortable

## 2024-05-24 NOTE — Telephone Encounter (Signed)
 Left message on voice mail to check in  Pt has appointment tomorrow

## 2024-05-25 ENCOUNTER — Ambulatory Visit: Admitting: Family Medicine

## 2024-05-25 ENCOUNTER — Ambulatory Visit: Payer: Self-pay | Admitting: Family Medicine

## 2024-05-25 ENCOUNTER — Ambulatory Visit
Admission: RE | Admit: 2024-05-25 | Discharge: 2024-05-25 | Disposition: A | Discharge status: Home / Self Care | Source: Ambulatory Visit | Attending: Family Medicine | Admitting: Family Medicine

## 2024-05-25 DIAGNOSIS — R600 Localized edema: Secondary | ICD-10-CM | POA: Diagnosis not present

## 2024-05-25 DIAGNOSIS — R936 Abnormal findings on diagnostic imaging of limbs: Secondary | ICD-10-CM | POA: Diagnosis not present

## 2024-05-25 DIAGNOSIS — M19032 Primary osteoarthritis, left wrist: Secondary | ICD-10-CM | POA: Diagnosis not present

## 2024-05-25 DIAGNOSIS — M25532 Pain in left wrist: Secondary | ICD-10-CM | POA: Insufficient documentation

## 2024-05-25 DIAGNOSIS — M67834 Other specified disorders of tendon, left wrist: Secondary | ICD-10-CM | POA: Insufficient documentation

## 2024-05-25 MED ORDER — GADOBUTROL 1 MMOL/ML IV SOLN
5.0000 mL | Freq: Once | INTRAVENOUS | Status: AC | PRN
  Administered 2024-05-25: 5 mL via INTRAVENOUS

## 2024-05-26 NOTE — Telephone Encounter (Signed)
 Copied from CRM [id number]. Topic: Clinical - Lab/Test Results >> May 25, 2024  2:10 PM Thersia C wrote: Reason for CRM: Patient called in stated she can not get in to her mychart , would like fro someone to give her a call when the imaging results come back  [phone] >> May 25, 2024  5:13 PM Corin V wrote: Patient called and asked if [place] could give her a call back in the morning as she had additional follow up questions about the MRI and did not want to speak to triage. >> May 25, 2024  4:05 PM Martinique E wrote: Patient called back regarding her imaging results from today. Please call patient and advise.

## 2024-06-02 ENCOUNTER — Ambulatory Visit (INDEPENDENT_AMBULATORY_CARE_PROVIDER_SITE_OTHER)

## 2024-06-02 DIAGNOSIS — M810 Age-related osteoporosis without current pathological fracture: Secondary | ICD-10-CM | POA: Diagnosis not present

## 2024-06-02 MED ORDER — DENOSUMAB 60 MG/ML ~~LOC~~ SOSY: 60.0000 mg | PREFILLED_SYRINGE | Freq: Once | SUBCUTANEOUS | Status: DC

## 2024-06-02 MED ORDER — DENOSUMAB 60 MG/ML ~~LOC~~ SOSY
60.0000 mg | PREFILLED_SYRINGE | Freq: Once | SUBCUTANEOUS | Status: AC
  Administered 2024-06-02: 60 mg via SUBCUTANEOUS

## 2024-06-02 NOTE — Progress Notes (Signed)
 Per orders of Dr. Laine Balls, injection of prolia  60 mg [place] given by Laray Arenas in left arm(pt request due to breast CA surgery rt side). Patient tolerated injection well. Patient will make appointment for 6 month.

## 2024-06-17 DIAGNOSIS — M810 Age-related osteoporosis without current pathological fracture: Secondary | ICD-10-CM | POA: Diagnosis not present

## 2024-06-17 DIAGNOSIS — E039 Hypothyroidism, unspecified: Secondary | ICD-10-CM | POA: Diagnosis not present

## 2024-06-19 ENCOUNTER — Telehealth: Payer: Self-pay | Admitting: *Deleted

## 2024-06-19 NOTE — Telephone Encounter (Signed)
 No, I don't think so  The no longer needs tsh from us  since seeing endocrinology   I think prolia  labs are utd   Thanks

## 2024-06-19 NOTE — Telephone Encounter (Signed)
 Sorry not sure why CRM didn't transfer but message said:  CRM # [id number]  Reason for CRM: Patient is asking if she still needs to do the lab work on July 21st since she is getting blood work done through her thyroid  specialist. Please advise the patient.

## 2024-06-19 NOTE — Telephone Encounter (Signed)
 It there a message?  I don't see it

## 2024-06-19 NOTE — Telephone Encounter (Signed)
Pt notified and lab appt cancelled  

## 2024-06-22 ENCOUNTER — Other Ambulatory Visit

## 2024-06-25 ENCOUNTER — Ambulatory Visit (INDEPENDENT_AMBULATORY_CARE_PROVIDER_SITE_OTHER): Admitting: Orthopedic Surgery

## 2024-06-25 VITALS — BP 113/76 | Ht 63.0 in | Wt 126.0 lb

## 2024-06-25 DIAGNOSIS — M19032 Primary osteoarthritis, left wrist: Secondary | ICD-10-CM | POA: Diagnosis not present

## 2024-06-25 DIAGNOSIS — M67834 Other specified disorders of tendon, left wrist: Secondary | ICD-10-CM | POA: Diagnosis not present

## 2024-06-26 ENCOUNTER — Encounter: Payer: Self-pay | Admitting: Orthopedic Surgery

## 2024-06-26 NOTE — Progress Notes (Signed)
 New Patient Visit  Assessment: Gabrielle Webb is a 83 y.o. female with the following: 1. Arthritis of left wrist 2. Tendinosis of left wrist  Plan: T[place] SHAUNNA Ishihara recently noticed some swelling in the left wrist, which resulted in increased pain.  This has resolved.  We reviewed imaging today, including the MRI which demonstrates some signs of arthritis.  Provided reassurance.  Increased pain and swelling could be secondary to the arthritis.  Medications as needed.  Can use topical treatments.  If wrist is bothering her, she can also use a brace for short period of time.  She states understanding.  She will follow-up as needed.  Follow-up: Return if symptoms worsen or fail to improve.  Subjective:  Chief Complaint  Patient presents with   Wrist Pain    Swelling a pain in the left wrist       History of Present Illness: Gabrielle Webb is a 83 y.o. female who has been referred by  Laine Balls, MD for evaluation of left wrist pain.  She recently started to notice some swelling in the left wrist, which was painful.  She was evaluated by her primary care provider.  Radiographs were obtained, which demonstrated some concerning findings.  As such, an MRI has been completed.  Since the onset of swelling, her pain has improved, as has the swelling.  She is doing much better now.  She was wearing a brace for period of time.   Review of Systems: No fevers or chills No numbness or tingling No chest pain No shortness of breath No bowel or bladder dysfunction No GI distress No headaches   Medical History:  Past Medical History:  Diagnosis Date   Allergy    Anxiety    Breast cancer (HCC) 7/12   Right   Cellulitis    Diverticulosis of colon    GERD (gastroesophageal reflux disease)    Headache    sinus   History of hiatal hernia    Hx of radiation therapy 06/20/11 - 07/11/11   right breast   Hypothyroid    Osteoporosis    Personal history of radiation therapy 2012   Right  Breast Cancer   PONV (postoperative nausea and vomiting)    Skin cancer    basal and squamous cell   Vertigo     Past Surgical History:  Procedure Laterality Date   ABDOMINAL HYSTERECTOMY N/A 11/08/2016   Procedure: HYSTERECTOMY TOTAL  ABDOMINAL;  Surgeon: [name], MD;  Location: [facility];  Service: Gynecology;  Laterality: N/A;   APPENDECTOMY  07/2007   back sugery  1970   BREAST BIOPSY Left    Benign   BREAST EXCISIONAL BIOPSY Left 1983   BREAST EXCISIONAL BIOPSY Left 1983   BREAST LUMPECTOMY Right 05/16/2011   COLONOSCOPY     COLONOSCOPY WITH PROPOFOL  N/A 08/27/2018   Procedure: COLONOSCOPY WITH PROPOFOL ;  Surgeon: [name], MD;  Location: [facility]COPY;  Service: Endoscopy;  Laterality: N/A;   COLONOSCOPY WITH PROPOFOL  N/A 08/16/2021   Procedure: COLONOSCOPY WITH PROPOFOL ;  Surgeon: [name], MD;  Location: [facility] ENDOSCOPY;  Service: Endoscopy;  Laterality: N/A;   DIAGNOSTIC LAPAROSCOPY     DILATION AND CURETTAGE OF UTERUS N/A 05/31/2014   Procedure: DILATATION AND CURETTAGE WITH ULTRASOUND GUIDANCE;  Surgeon: Rosaline LITTIE Cobble, MD;  Location: [facility];  Service: Gynecology;  Laterality: N/A;   ESOPHAGOGASTRODUODENOSCOPY  04/20/2003   KNEE SURGERY  1962   MASTECTOMY     right eye surgery  SALPINGOOPHORECTOMY Bilateral 11/08/2016   Procedure: BILATERAL SALPINGO OOPHORECTOMY;  Surgeon: [name], MD;  Location: [facility];  Service: Gynecology;  Laterality: Bilateral;   UPPER GI ENDOSCOPY      Family History  Problem Relation Age of Onset   Atrial fibrillation Mother    Coronary artery disease Mother    Congenital heart disease Mother    Heart attack Father 76   Hypertension Father    Diabetes Maternal Aunt    Cancer Maternal Aunt        leukemia   Diabetes Maternal Uncle    Cancer Maternal Uncle        colon   Diabetes Other        Grandmother   Coronary artery disease Other        Grandmother   Uterine cancer Other        Grandmother    Leukemia Other        Aunt   Social History   Tobacco Use   Smoking status: Never   Smokeless tobacco: Never  Vaping Use   Vaping status: Never Used  Substance Use Topics   Alcohol use: No    Alcohol/week: 0.0 standard drinks of alcohol   Drug use: No    Allergies  Allergen Reactions   Codeine Nausea And Vomiting   Fentanyl  Nausea And Vomiting   Hydromorphone  Nausea And Vomiting        Morphine Nausea And Vomiting   Ciprofloxacin  Other (See Comments)    Intolerant- tingling  ciprofloxacin    Tape     Surgical tape   Prednisone Palpitations    Rapid hear beat      Objective: BP 113/76   Ht 5' 3 (1.6 m)   Wt 126 lb (57.2 kg)   LMP 12/03/1988   BMI 22.32 kg/m   Physical Exam:  General: Alert and oriented. and No acute distress. Gait: Normal gait.  Evaluation of left wrist demonstrates no deformity.  No point tenderness.  She has good range of motion.  Good grip strength.  Fingers warm well-perfused.  Sensation intact throughout the left hand.  IMAGING: I personally reviewed images previously obtained in clinic   X-rays of the left wrist were obtained.  No acute injuries.  Bone with increased density within the distal radius without concern on MRI.  MRI demonstrates cysts within carpal bones around the wrist.  There is some tenosynovitis.  No acute injuries.   New Medications:  No orders of the defined types were placed in this encounter.     Oneil DELENA Horde, MD  06/26/2024 12:03 PM

## 2024-07-08 ENCOUNTER — Telehealth: Payer: Self-pay | Admitting: *Deleted

## 2024-07-08 DIAGNOSIS — E039 Hypothyroidism, unspecified: Secondary | ICD-10-CM | POA: Diagnosis not present

## 2024-07-08 NOTE — Telephone Encounter (Signed)
 Please follow up for visit for that   Or triage if she cannot come in  Thanks

## 2024-07-08 NOTE — Telephone Encounter (Signed)
 Copied from CRM #[id number]. Topic: Clinical - Request for Lab/Test Order >> Jul 08, 2024  2:07 PM Gabrielle Webb wrote: Reason for CRM: Patient would like to speak with Dr. Randeen regarding how she's feeling and if she can have blood work done on vitamin levels. Having brain fog sometimes. Please call [phone] to schedule once ordered.

## 2024-07-10 ENCOUNTER — Telehealth: Payer: Self-pay | Admitting: Family Medicine

## 2024-07-10 NOTE — Telephone Encounter (Signed)
 Nothing I know of that would help  Last B12 and folate levels were normal   Follow up if she wants to discuss further

## 2024-07-10 NOTE — Telephone Encounter (Signed)
 From what I see TSH looked to be in the normal range She will need to check in with [facility] re: her mychart account and fix that Unsure if her endocrinologist had any comments (that is something I cannot see in care everywhere)  regarding plan

## 2024-07-10 NOTE — Telephone Encounter (Signed)
 Copied from CRM [id number]. Topic: Clinical - Lab/Test Results >> Jul 09, 2024  3:37 PM Gabrielle Webb wrote: Reason for CRM: Patient would like to know if Dr Randeen has received her results from [facility] clinic that she had done on yesterday.She stated that she isn't able to get into her [facility] mychart to check. She would like to know the results.

## 2024-07-10 NOTE — Telephone Encounter (Signed)
 Called pt and she said she was in the middle of something and she placed me on hold and hung up

## 2024-07-10 NOTE — Telephone Encounter (Signed)
 Copied from CRM #[id number]. Topic: Clinical - Medication Question >> Jul 10, 2024  9:17 AM Donna BRAVO wrote: Reason for CRM: patient has long Covid and brain fog, patient would like to talk to nurse about vitamin injections for the brain fog.   Patient was made aware their call will be returned in 1 business day.

## 2024-07-13 ENCOUNTER — Ambulatory Visit: Payer: Self-pay | Admitting: Family Medicine

## 2024-07-13 ENCOUNTER — Ambulatory Visit (INDEPENDENT_AMBULATORY_CARE_PROVIDER_SITE_OTHER): Admitting: Family Medicine

## 2024-07-13 ENCOUNTER — Encounter: Payer: Self-pay | Admitting: Family Medicine

## 2024-07-13 VITALS — BP 94/60 | HR 86 | Temp 97.9°F | Ht 63.0 in | Wt 127.5 lb

## 2024-07-13 DIAGNOSIS — R5382 Chronic fatigue, unspecified: Secondary | ICD-10-CM

## 2024-07-13 DIAGNOSIS — F4320 Adjustment disorder, unspecified: Secondary | ICD-10-CM | POA: Diagnosis not present

## 2024-07-13 DIAGNOSIS — E039 Hypothyroidism, unspecified: Secondary | ICD-10-CM | POA: Diagnosis not present

## 2024-07-13 DIAGNOSIS — F4322 Adjustment disorder with anxiety: Secondary | ICD-10-CM

## 2024-07-13 DIAGNOSIS — R413 Other amnesia: Secondary | ICD-10-CM | POA: Diagnosis not present

## 2024-07-13 LAB — COMPREHENSIVE METABOLIC PANEL WITH GFR
ALT: 9 U/L (ref 0–35)
AST: 17 U/L (ref 0–37)
Albumin: 4 g/dL (ref 3.5–5.2)
Alkaline Phosphatase: 44 U/L (ref 39–117)
BUN: 15 mg/dL (ref 6–23)
CO2: 30 meq/L (ref 19–32)
Calcium: 8.9 mg/dL (ref 8.4–10.5)
Chloride: 106 meq/L (ref 96–112)
Creatinine, Ser: 0.66 mg/dL (ref 0.40–1.20)
GFR: 82.42 mL/min (ref >60.00)
Glucose, Bld: 90 mg/dL (ref 70–99)
Potassium: 4.2 meq/L (ref 3.5–5.1)
Sodium: 140 meq/L (ref 135–145)
Total Bilirubin: 0.6 mg/dL (ref 0.2–1.2)
Total Protein: 6.2 g/dL (ref 6.0–8.3)

## 2024-07-13 LAB — CBC WITH DIFFERENTIAL/PLATELET
Basophils Absolute: 0 K/uL (ref 0.0–0.1)
Basophils Relative: 0.4 % (ref 0.0–3.0)
Eosinophils Absolute: 0 K/uL (ref 0.0–0.7)
Eosinophils Relative: 0.7 % (ref 0.0–5.0)
HCT: 38.4 % (ref 36.0–46.0)
Hemoglobin: 12.8 g/dL (ref 12.0–15.0)
Lymphocytes Relative: 25.3 % (ref 12.0–46.0)
Lymphs Abs: 1.7 K/uL (ref 0.7–4.0)
MCHC: 33.4 g/dL (ref 30.0–36.0)
MCV: 89.3 fl (ref 78.0–100.0)
Monocytes Absolute: 0.5 K/uL (ref 0.1–1.0)
Monocytes Relative: 7 % (ref 3.0–12.0)
Neutro Abs: 4.4 K/uL (ref 1.4–7.7)
Neutrophils Relative %: 66.6 % (ref 43.0–77.0)
Platelets: 161 K/uL (ref 150.0–400.0)
RBC: 4.31 Mil/uL (ref 3.87–5.11)
RDW: 14.7 % (ref 11.5–15.5)
WBC: 6.7 K/uL (ref 4.0–10.5)

## 2024-07-13 LAB — FOLATE: Folate: 21 ng/mL (ref >5.9)

## 2024-07-13 LAB — VITAMIN B12: Vitamin B-12: 1449 pg/mL — ABNORMAL HIGH (ref 211–911)

## 2024-07-13 NOTE — Assessment & Plan Note (Signed)
 Seeing endocrinology Reviewed last tSH in normal range

## 2024-07-13 NOTE — Assessment & Plan Note (Signed)
 Reviewed stressors/ coping techniques/symptoms/ support sources/ tx options and side effects in detail today Continues lexapro  10 mg daily  Some grief after loss of cat Working through that  [employer] counseling if needed

## 2024-07-13 NOTE — Telephone Encounter (Signed)
 Pt called back and schedulers scheduled appt today to discuss

## 2024-07-13 NOTE — Assessment & Plan Note (Signed)
 Pt notes word recall issues still  May be multi factorial with past covid, age, anxiety, grief  Worse since covid in 2024 Thinks concentration is good but had trouble learning a new card game   Recommend she continue socialization and brain work-see AVS  Also exercise  Referred to neuro for further eval   Last mmse here was 28/30

## 2024-07-13 NOTE — Progress Notes (Signed)
 Subjective:    Patient ID: Gabrielle Webb, female    DOB: 1941/07/22, 83 y.o.   MRN: [id number]  HPI  Wt Readings from Last 3 Encounters:  07/13/24 127 lb 8 oz (57.8 kg)  06/25/24 126 lb (57.2 kg)  05/07/24 127 lb 8 oz (57.8 kg)   22.59 kg/m  Vitals:   07/13/24 1052  BP: 94/60  Pulse: 86  Temp: 97.9 F (36.6 C)  SpO2: 100%    Pt presents with c/o  Brain fog (short term memory)  Mood    Has had issues with fatigue and brain fog after getting covid in 2024   Has asked about need for vitamin infusion    Has anxiety /adj reaction Taking lexapro  10 mg daily  Lab Results  Component Value Date   VITAMINB12 1,227 (H) 10/28/2023   Last vitamin D  Lab Results  Component Value Date   VD25OH 46.03 10/28/2023   Lab Results  Component Value Date   WBC 8.6 08/21/2023   HGB 12.6 08/21/2023   HCT 38.3 08/21/2023   MCV 91.4 08/21/2023   PLT 179.0 08/21/2023   Folate 19.4 normal 8 mo ago  Lab Results  Component Value Date   NA 140 05/14/2024   K 4.0 05/14/2024   CO2 26 05/14/2024   GLUCOSE 88 05/14/2024   BUN 13 05/14/2024   CREATININE 0.70 05/14/2024   CALCIUM  9.3 05/14/2024   GFR 81.79 02/07/2024   EGFR 87 05/14/2024   GFRNONAA >60 06/25/2023    TSH 1.723 on 8/6  Seeing endocrinology  Levothyroxine  75 mcg daily   Last MMSE 28 out of 30 10/2023   She really wants [facility] go [facility]   Takes pepcid  for gerd No ppi    Some word recall issues  It comes to her later   Balances her check book all the time  Concentration is good    Socialization  On the phone a lot  Socializes with neighbors   Wants to walk and exercise more  Tried to play cards (new card game) and it frustrated her   Recently lost her 78 yo cat-that was hard so anxiety is worse     Patient Active Problem List   Diagnosis Date Noted   Arthritis of left wrist 05/25/2024   Tendinosis of left wrist 05/25/2024   Abnormal plain x-ray of wrist  05/22/2024   Left wrist pain 05/22/2024   Episode of confusion 10/28/2023   Memory change 10/02/2023   Left inguinal pain 08/21/2023   Normocytic anemia 07/09/2023   Post covid-19 condition, unspecified 07/05/2023   Dark stools 07/05/2023   Hypokalemia 06/30/2023   Skin lesion 05/02/2023   Right knee pain 04/24/2023   Vaginal pain 01/18/2023   Grief reaction 07/04/2022   Encounter for screening mammogram for breast cancer 12/18/2021   Anxious mood as adjustment reaction 06/09/2021   History of breast cancer 05/03/2020   Medicare annual wellness visit, subsequent 05/28/2018   Estrogen deficiency 05/28/2018   Fatigue 05/22/2017   S/P TAH-BSO 11/08/2016   Routine general medical examination at a health care facility 05/06/2016   Malignant neoplasm of upper-outer quadrant of right breast in female, estrogen receptor positive (HCC) 03/21/2015   History of tamoxifen  therapy 05/18/2014   Encounter for routine gynecological examination 05/06/2013   Colon cancer screening 05/06/2013   Encounter for Medicare annual wellness exam 04/28/2013   Hx of radiation therapy    Hx of Breast cancer, stage 1, Right,  UOQ, Receptor+,Her2- 05/29/2011    Class: Stage 1   Other screening mammogram 03/21/2011   Post-menopausal 03/21/2011   Hypothyroidism 01/23/2008   Pure hypercholesterolemia 01/23/2008   Allergic rhinitis 01/23/2008   GERD 01/23/2008   Diverticulosis of colon 01/23/2008   IBS 01/23/2008   FIBROCYSTIC BREAST DISEASE 01/23/2008   Osteoporosis 10/17/2007   Past Medical History:  Diagnosis Date   Allergy    Anxiety    Breast cancer (HCC) 7/12   Right   Cellulitis    Diverticulosis of colon    GERD (gastroesophageal reflux disease)    Headache    sinus   History of hiatal hernia    Hx of radiation therapy 06/20/11 - 07/11/11   right breast   Hypothyroid    Osteoporosis    Personal history of radiation therapy 2012   Right Breast Cancer   PONV (postoperative nausea and  vomiting)    Skin cancer    basal and squamous cell   Vertigo    Past Surgical History:  Procedure Laterality Date   ABDOMINAL HYSTERECTOMY N/A 11/08/2016   Procedure: HYSTERECTOMY TOTAL  ABDOMINAL;  Surgeon: [name], MD;  Location: [facility];  Service: Gynecology;  Laterality: N/A;   APPENDECTOMY  07/2007   back sugery  1970   BREAST BIOPSY Left    Benign   BREAST EXCISIONAL BIOPSY Left 1983   BREAST EXCISIONAL BIOPSY Left 1983   BREAST LUMPECTOMY Right 05/16/2011   COLONOSCOPY     COLONOSCOPY WITH PROPOFOL  N/A 08/27/2018   Procedure: COLONOSCOPY WITH PROPOFOL ;  Surgeon: [name], MD;  Location: [facility];  Service: Endoscopy;  Laterality: N/A;   COLONOSCOPY WITH PROPOFOL  N/A 08/16/2021   Procedure: COLONOSCOPY WITH PROPOFOL ;  Surgeon: [name], MD;  Location: [facility] ENDOSCOPY;  Service: Endoscopy;  Laterality: N/A;   DIAGNOSTIC LAPAROSCOPY     DILATION AND CURETTAGE OF UTERUS N/A 05/31/2014   Procedure: DILATATION AND CURETTAGE WITH ULTRASOUND GUIDANCE;  Surgeon: Rosaline LITTIE Cobble, MD;  Location: [facility];  Service: Gynecology;  Laterality: N/A;   ESOPHAGOGASTRODUODENOSCOPY  04/20/2003   KNEE SURGERY  1962   MASTECTOMY     right eye surgery     SALPINGOOPHORECTOMY Bilateral 11/08/2016   Procedure: BILATERAL SALPINGO OOPHORECTOMY;  Surgeon: [name], MD;  Location: [facility];  Service: Gynecology;  Laterality: Bilateral;   UPPER GI ENDOSCOPY     Social History   Tobacco Use   Smoking status: Never   Smokeless tobacco: Never  Vaping Use   Vaping status: Never Used  Substance Use Topics   Alcohol use: No    Alcohol/week: 0.0 standard drinks of alcohol   Drug use: No   Family History  Problem Relation Age of Onset   Atrial fibrillation Mother    Coronary artery disease Mother    Congenital heart disease Mother    Heart attack Father 26   Hypertension Father    Diabetes Maternal Aunt    Cancer Maternal Aunt        leukemia   Diabetes  Maternal Uncle    Cancer Maternal Uncle        colon   Diabetes Other        Grandmother   Coronary artery disease Other        Grandmother   Uterine cancer Other        Grandmother   Leukemia Other        Aunt   Allergies  Allergen Reactions   Codeine Nausea And Vomiting  Fentanyl  Nausea And Vomiting   Hydromorphone  Nausea And Vomiting        Morphine Nausea And Vomiting   Ciprofloxacin  Other (See Comments)    Intolerant- tingling  ciprofloxacin    Tape     Surgical tape   Prednisone Palpitations    Rapid hear beat   Current Outpatient Medications on File Prior to Visit  Medication Sig Dispense Refill   aspirin EC 81 MG tablet Take 81 mg by mouth daily.     atorvastatin  (LIPITOR) 10 MG tablet TAKE 1 TABLET BY MOUTH DAILY 90 tablet 0   CALCIUM  PO Take 1,500 mg by mouth daily.     carboxymethylcellulose (REFRESH PLUS) 0.5 % SOLN 1 drop 3 (three) times daily as needed.     chlorthalidone  (HYGROTON ) 25 MG tablet TAKE TWO TABLETS EVERY DAY AS NEEDED FORSWELLING 180 tablet 0   Cholecalciferol (VITAMIN D ) 2000 UNITS CAPS Take 1 capsule by mouth daily.     Cyanocobalamin  (B-12 PO) Take 2,500 mcg by mouth daily.     denosumab  (PROLIA ) 60 MG/ML SOSY injection Inject 60 mg into the skin every 6 (six) months. 1 mL 0   diclofenac  sodium (VOLTAREN ) 1 % GEL Apply 2 g topically 4 (four) times daily. Rub into affected area of foot 2 to 4 times daily (Patient taking differently: Apply 2 g topically as needed. Rub into affected area of foot 2 to 4 times daily) 100 g 2   dicyclomine  (BENTYL ) 10 MG capsule TAKE 1 CAPSULE BY MOUTH TWICE DAILY FOR SPASMS 180 capsule 2   escitalopram  (LEXAPRO ) 5 MG tablet TAKE 1 TABLET BY MOUTH DAILY (Patient taking differently: Take 10 mg by mouth daily.) 90 tablet 1   famotidine  (PEPCID ) 20 MG tablet TAKE ONE TABLET EVERY DAY AS NEEDED FOR HEARTBURN OR INDIGESTION 90 tablet 3   Flaxseed, Linseed, (FLAX SEED OIL PO) Take by mouth. 2 a day     fluticasone   (FLONASE ) 50 MCG/ACT nasal spray SPRAY TWICE INTO EACH NOSTRIL ONCE DAILY 16 g 3   levothyroxine  (SYNTHROID ) 75 MCG tablet Take 1 tablet (75 mcg total) by mouth daily. 90 tablet 0   Multiple Vitamin (MULTIVITAMIN) capsule Take 1 capsule by mouth daily.     Propylene Glycol (SYSTANE COMPLETE OP) Apply to eye.     Current Facility-Administered Medications on File Prior to Visit  Medication Dose Route Frequency Provider Last Rate Last Admin   [START ON 12/03/2024] denosumab  (PROLIA ) injection 60 mg  60 mg Subcutaneous Once Nasario Czerniak, Laine LABOR, MD        Review of Systems  Constitutional:  Positive for fatigue. Negative for activity change, appetite change, fever and unexpected weight change.  HENT:  Negative for congestion, ear pain, rhinorrhea, sinus pressure and sore throat.   Eyes:  Negative for pain, redness and visual disturbance.  Respiratory:  Negative for cough, shortness of breath and wheezing.   Cardiovascular:  Negative for chest pain and palpitations.  Gastrointestinal:  Negative for abdominal pain, blood in stool, constipation and diarrhea.  Endocrine: Negative for polydipsia and polyuria.  Genitourinary:  Negative for dysuria, frequency and urgency.  Musculoskeletal:  Negative for arthralgias, back pain and myalgias.  Skin:  Negative for pallor and rash.  Allergic/Immunologic: Negative for environmental allergies.  Neurological:  Negative for dizziness, syncope and headaches.  Hematological:  Negative for adenopathy. Does not bruise/bleed easily.  Psychiatric/Behavioral:  Positive for decreased concentration and dysphoric mood. Negative for suicidal ideas. The patient is nervous/anxious.  Objective:   Physical Exam Constitutional:      General: She is not in acute distress.    Appearance: Normal appearance. She is well-developed and normal weight. She is not ill-appearing or diaphoretic.  HENT:     Head: Normocephalic and atraumatic.  Eyes:     Conjunctiva/sclera:  Conjunctivae normal.     Pupils: Pupils are equal, round, and reactive to light.  Neck:     Thyroid : No thyromegaly.     Vascular: No carotid bruit or JVD.  Cardiovascular:     Rate and Rhythm: Normal rate and regular rhythm.     Heart sounds: Normal heart sounds.     No gallop.  Pulmonary:     Effort: Pulmonary effort is normal. No respiratory distress.     Breath sounds: Normal breath sounds. No wheezing or rales.  Abdominal:     General: There is no distension or abdominal bruit.     Palpations: Abdomen is soft.  Musculoskeletal:     Cervical back: Normal range of motion and neck supple.     Right lower leg: No edema.     Left lower leg: No edema.  Lymphadenopathy:     Cervical: No cervical adenopathy.  Skin:    General: Skin is warm and dry.     Coloration: Skin is not pale.     Findings: No rash.  Neurological:     Mental Status: She is alert.     Coordination: Coordination normal.     Deep Tendon Reflexes: Reflexes are normal and symmetric. Reflexes normal.  Psychiatric:        Attention and Perception: Attention normal.        Mood and Affect: Mood is anxious and depressed. Affect is not tearful.        Speech: Speech normal.        Behavior: Behavior normal.     Comments: Candidly discusses symptoms and stressors   Almost tearful when discussing loss of pet            Assessment & Plan:   Problem List Items Addressed This Visit       Endocrine   Hypothyroidism   Seeing endocrinology Reviewed last tSH in normal range         Other   Memory change - Primary   Pt notes word recall issues still  May be multi factorial with past covid, age, anxiety, grief  Worse since covid in 2024 Thinks concentration is good but had trouble learning a new card game   Recommend she continue socialization and brain work-see AVS  Also exercise  Referred to neuro for further eval   Last mmse here was 28/30       Relevant Orders   Vitamin B12   Folate   CBC  with Differential/Platelet   Comprehensive metabolic panel with GFR   Ambulatory referral to Neurology   Grief reaction   Loss of cat Reviewed stressors/ coping techniques/symptoms/ support sources/ tx options and side effects in detail today Continues lexapro  10 mg daily   Offered counseling if needed Good support       Fatigue   Ongoing  Post covid  Some short term memory issues Urged not to start new vitamins unless she is deficient   Lab today      Relevant Orders   Vitamin B12   Folate   CBC with Differential/Platelet   Comprehensive metabolic panel with GFR   Anxious mood as adjustment reaction   Reviewed  stressors/ coping techniques/symptoms/ support sources/ tx options and side effects in detail today Continues lexapro  10 mg daily  Some grief after loss of cat Working through that  [employer] counseling if needed

## 2024-07-13 NOTE — Patient Instructions (Addendum)
 Work hard to keep your memory and cognition (brain) sharp   Socialize Try and learn new things Let yourself grieve  Keep doing your finances  Stay physically active Get outdoors  Read  Puzzles /word search   I put the referral in for neurology to discuss memory  Please let us  know if you don't hear in 1-2 weeks to set that up (mychart message or call or letter)    Take care of yourself!   Stop up front to de activate your mychart account

## 2024-07-13 NOTE — Assessment & Plan Note (Signed)
 Ongoing  Post covid  Some short term memory issues Urged not to start new vitamins unless she is deficient   Lab today

## 2024-07-13 NOTE — Assessment & Plan Note (Signed)
 Loss of cat Reviewed stressors/ coping techniques/symptoms/ support sources/ tx options and side effects in detail today Continues lexapro  10 mg daily   Offered counseling if needed Good support

## 2024-07-19 ENCOUNTER — Telehealth: Payer: Self-pay | Admitting: Family Medicine

## 2024-07-19 DIAGNOSIS — M818 Other osteoporosis without current pathological fracture: Secondary | ICD-10-CM

## 2024-07-19 DIAGNOSIS — E7800 Pure hypercholesterolemia, unspecified: Secondary | ICD-10-CM

## 2024-07-19 DIAGNOSIS — R5382 Chronic fatigue, unspecified: Secondary | ICD-10-CM

## 2024-07-19 DIAGNOSIS — D649 Anemia, unspecified: Secondary | ICD-10-CM

## 2024-07-19 NOTE — Telephone Encounter (Signed)
-----   Message from Veva JINNY Ferrari sent at 07/08/2024 12:23 PM EDT ----- Regarding: Lab orders for MON, 8.18.25 Patient is scheduled for CPX labs, please order future labs, Thanks , Veva

## 2024-07-20 ENCOUNTER — Other Ambulatory Visit (INDEPENDENT_AMBULATORY_CARE_PROVIDER_SITE_OTHER)

## 2024-07-20 ENCOUNTER — Ambulatory Visit: Payer: Self-pay | Admitting: Family Medicine

## 2024-07-20 DIAGNOSIS — R5382 Chronic fatigue, unspecified: Secondary | ICD-10-CM

## 2024-07-20 DIAGNOSIS — E7800 Pure hypercholesterolemia, unspecified: Secondary | ICD-10-CM

## 2024-07-20 DIAGNOSIS — M818 Other osteoporosis without current pathological fracture: Secondary | ICD-10-CM

## 2024-07-20 LAB — COMPREHENSIVE METABOLIC PANEL WITH GFR
ALT: 10 U/L (ref 0–35)
AST: 19 U/L (ref 0–37)
Albumin: 4.2 g/dL (ref 3.5–5.2)
Alkaline Phosphatase: 42 U/L (ref 39–117)
BUN: 14 mg/dL (ref 6–23)
CO2: 30 meq/L (ref 19–32)
Calcium: 8.7 mg/dL (ref 8.4–10.5)
Chloride: 104 meq/L (ref 96–112)
Creatinine, Ser: 0.68 mg/dL (ref 0.40–1.20)
GFR: 81.82 mL/min (ref >60.00)
Glucose, Bld: 92 mg/dL (ref 70–99)
Potassium: 4 meq/L (ref 3.5–5.1)
Sodium: 141 meq/L (ref 135–145)
Total Bilirubin: 0.7 mg/dL (ref 0.2–1.2)
Total Protein: 6.6 g/dL (ref 6.0–8.3)

## 2024-07-20 LAB — LIPID PANEL
Cholesterol: 204 mg/dL — ABNORMAL HIGH (ref 0–200)
HDL: 75.2 mg/dL (ref >39.00)
LDL Cholesterol: 114 mg/dL — ABNORMAL HIGH (ref 0–99)
NonHDL: 128.83
Total CHOL/HDL Ratio: 3
Triglycerides: 73 mg/dL (ref 0.0–149.0)
VLDL: 14.6 mg/dL (ref 0.0–40.0)

## 2024-07-20 LAB — VITAMIN D 25 HYDROXY (VIT D DEFICIENCY, FRACTURES): VITD: 55.85 ng/mL (ref 30.00–100.00)

## 2024-07-27 ENCOUNTER — Encounter: Payer: Self-pay | Admitting: Family Medicine

## 2024-07-27 ENCOUNTER — Ambulatory Visit (INDEPENDENT_AMBULATORY_CARE_PROVIDER_SITE_OTHER): Admitting: Family Medicine

## 2024-07-27 VITALS — BP 130/78 | HR 65 | Temp 97.8°F | Ht 62.5 in | Wt 130.0 lb

## 2024-07-27 DIAGNOSIS — R413 Other amnesia: Secondary | ICD-10-CM | POA: Diagnosis not present

## 2024-07-27 DIAGNOSIS — M818 Other osteoporosis without current pathological fracture: Secondary | ICD-10-CM | POA: Diagnosis not present

## 2024-07-27 DIAGNOSIS — E7800 Pure hypercholesterolemia, unspecified: Secondary | ICD-10-CM

## 2024-07-27 DIAGNOSIS — Z1231 Encounter for screening mammogram for malignant neoplasm of breast: Secondary | ICD-10-CM

## 2024-07-27 DIAGNOSIS — Z0000 Encounter for general adult medical examination without abnormal findings: Secondary | ICD-10-CM | POA: Diagnosis not present

## 2024-07-27 DIAGNOSIS — E039 Hypothyroidism, unspecified: Secondary | ICD-10-CM

## 2024-07-27 DIAGNOSIS — J301 Allergic rhinitis due to pollen: Secondary | ICD-10-CM | POA: Diagnosis not present

## 2024-07-27 DIAGNOSIS — F4322 Adjustment disorder with anxiety: Secondary | ICD-10-CM

## 2024-07-27 NOTE — Assessment & Plan Note (Signed)
 Pt has mammogram scheduled on 8/27 Personal history of breast cancer

## 2024-07-27 NOTE — Assessment & Plan Note (Signed)
 Much worse since having covid Brain fog  Also short term memory issues   Referral in place for neurology  Pt continues to stay socially and physically active and does her own finances as well

## 2024-07-27 NOTE — Progress Notes (Signed)
 Subjective:    Patient ID: Gabrielle Webb, female    DOB: [birth year], 83 y.o.   MRN: [id number]  HPI  Here for health maintenance exam and to review chronic medical problems   Wt Readings from Last 3 Encounters:  07/27/24 130 lb (59 kg)  07/13/24 127 lb 8 oz (57.8 kg)  06/25/24 126 lb (57.2 kg)   23.40 kg/m  Vitals:   07/27/24 1119  BP: 130/78  Pulse: 65  Temp: 97.8 F (36.6 C)  SpO2: 99%    Immunization History  Administered Date(s) Administered   Fluad Quad(high Dose 65+) 08/27/2019   INFLUENZA, HIGH DOSE SEASONAL PF 09/19/2015, 08/26/2017, 08/23/2020, 09/08/2021, 01/20/2024   Influenza Split 09/17/2011   Influenza Whole 10/17/2007, 09/27/2008, 09/23/2009   Influenza-Unspecified 09/01/2013, 10/19/2014, 08/24/2016, 10/21/2018   PFIZER(Purple Top)SARS-COV-2 Vaccination 12/29/2019, 01/19/2020, 09/15/2020   Pneumococcal Conjugate-13 05/13/2015   Pneumococcal Polysaccharide-23 12/16/2006, 05/11/2014   Td 12/03/2005   Tdap 12/10/2017   Zoster Recombinant(Shingrix) 05/21/2018, 08/12/2018   Zoster, Live 12/03/2007    Health Maintenance Due  Topic Date Due   MAMMOGRAM  01/29/2024   Medicare Annual Wellness (AWV)  07/01/2024   Has some nasal congestion since traveling  Pending neuro visit for brain fog and memory issues    Gets flu shot in fall    Mammogram 07/2023 -has one scheduled on 8/27 Personal history of breast cancer  Self breast exam  Gyn health Has seen Dr Mat in the past  No gyn problems  Had a total hysterectomy in the past     Colon cancer screening  Colonoscopy 08/2021 with no recall due to age   Bone health  Dexa 07/2022 -osteoporosis , has one scheduled 8/27 Taking prolia   Falls-none Fractures=none  Supplements -vitamin D  2000 international units daily Last vitamin D  Lab Results  Component Value Date   VD25OH 55.85 07/20/2024    Exercise  Walking until it bothered her knee  (has PT exercises for her lower body) Has some 2  lb weights     Mood    04/02/2024   12:04 PM 10/28/2023    9:26 AM 07/02/2023    9:01 AM 05/02/2023    8:27 AM 09/20/2022    8:49 AM  Depression screen PHQ 2/9  Decreased Interest 3 0 0 0 0  Down, Depressed, Hopeless 1 0 0 0 0  PHQ - 2 Score 4 0 0 0 0  Altered sleeping 0 0  0   Tired, decreased energy 2 3  0   Change in appetite 0 0  0   Feeling bad or failure about yourself  0 0  0   Trouble concentrating 0 0  0   Moving slowly or fidgety/restless 0 0  0   Suicidal thoughts 0 0  0   PHQ-9 Score 6 3  0   Difficult doing work/chores  Not difficult at all  Not difficult at all    Lexapro  for anxious mood/adj rxn Thinks she is doing ok emotionally   Involved in church  Socializes  Works on finance things at the house - for brain health     Hypothyroidism  Pt has no clinical changes No change in energy level/ hair or skin/ edema and no tremor Lab Results  Component Value Date   TSH 0.05 (L) 05/14/2024   TSH was 1.723 on 07/08/24 at [facility]  Levothyroxine  75 mcg daily Seeing endocrinology  Takes prn chlorthalidone  for swelling prn   Lab Results  Component Value Date  NA 141 07/20/2024   K 4.0 07/20/2024   CO2 30 07/20/2024   GLUCOSE 92 07/20/2024   BUN 14 07/20/2024   CREATININE 0.68 07/20/2024   CALCIUM  8.7 07/20/2024   GFR 81.82 07/20/2024   EGFR 87 05/14/2024   GFRNONAA >60 06/25/2023   Hyperlipidemia Lab Results  Component Value Date   CHOL 204 (H) 07/20/2024   CHOL 186 02/07/2024   CHOL 203 (H) 10/28/2023   Lab Results  Component Value Date   HDL 75.20 07/20/2024   HDL 71.10 02/07/2024   HDL 59.60 10/28/2023   Lab Results  Component Value Date   LDLCALC 114 (H) 07/20/2024   LDLCALC 99 02/07/2024   LDLCALC 118 (H) 10/28/2023   Lab Results  Component Value Date   TRIG 73.0 07/20/2024   TRIG 78.0 02/07/2024   TRIG 128.0 10/28/2023   Lab Results  Component Value Date   CHOLHDL 3 07/20/2024   CHOLHDL 3 02/07/2024   CHOLHDL 3 10/28/2023   Lab  Results  Component Value Date   LDLDIRECT 142.0 03/19/2011   LDLDIRECT 125.4 12/08/2009   LDLDIRECT 172.0 09/19/2009   Atorvastatin  10 mg daily  Also omega 3 supplement  Does eat very healthy for the most part    Lab Results  Component Value Date   WBC 6.7 07/13/2024   HGB 12.8 07/13/2024   HCT 38.4 07/13/2024   MCV 89.3 07/13/2024   PLT 161.0 07/13/2024   Lab Results  Component Value Date   ALT 10 07/20/2024   AST 19 07/20/2024   ALKPHOS 42 07/20/2024   BILITOT 0.7 07/20/2024     Patient Active Problem List   Diagnosis Date Noted   Arthritis of left wrist 05/25/2024   Tendinosis of left wrist 05/25/2024   Abnormal plain x-ray of wrist 05/22/2024   Left wrist pain 05/22/2024   Episode of confusion 10/28/2023   Memory change 10/02/2023   Left inguinal pain 08/21/2023   Post covid-19 condition, unspecified 07/05/2023   Skin lesion 05/02/2023   Right knee pain 04/24/2023   Grief reaction 07/04/2022   Encounter for screening mammogram for breast cancer 12/18/2021   Anxious mood as adjustment reaction 06/09/2021   History of breast cancer 05/03/2020   Medicare annual wellness visit, subsequent 05/28/2018   Estrogen deficiency 05/28/2018   Fatigue 05/22/2017   S/P TAH-BSO 11/08/2016   Routine general medical examination at a health care facility 05/06/2016   History of tamoxifen  therapy 05/18/2014   Encounter for routine gynecological examination 05/06/2013   Colon cancer screening 05/06/2013   Encounter for Medicare annual wellness exam 04/28/2013   Hx of radiation therapy    Hx of Breast cancer, stage 1, Right, UOQ, Receptor+,Her2- 05/29/2011    Class: Stage 1   Post-menopausal 03/21/2011   Hypothyroidism 01/23/2008   Pure hypercholesterolemia 01/23/2008   Allergic rhinitis 01/23/2008   GERD 01/23/2008   Diverticulosis of colon 01/23/2008   IBS 01/23/2008   FIBROCYSTIC BREAST DISEASE 01/23/2008   Osteoporosis 10/17/2007   Past Medical History:  Diagnosis  Date   Allergy    Anxiety    Breast cancer (HCC) 7/12   Right   Cellulitis    Diverticulosis of colon    GERD (gastroesophageal reflux disease)    Headache    sinus   History of hiatal hernia    Hx of radiation therapy 06/20/11 - 07/11/11   right breast   Hypothyroid    Osteoporosis    Personal history of radiation therapy 2012   Right  Breast Cancer   PONV (postoperative nausea and vomiting)    Skin cancer    basal and squamous cell   Vertigo    Past Surgical History:  Procedure Laterality Date   ABDOMINAL HYSTERECTOMY N/A 11/08/2016   Procedure: HYSTERECTOMY TOTAL  ABDOMINAL;  Surgeon: [name], MD;  Location: [facility];  Service: Gynecology;  Laterality: N/A;   APPENDECTOMY  07/2007   back sugery  1970   BREAST BIOPSY Left    Benign   BREAST EXCISIONAL BIOPSY Left 1983   BREAST EXCISIONAL BIOPSY Left 1983   BREAST LUMPECTOMY Right 05/16/2011   COLONOSCOPY     COLONOSCOPY WITH PROPOFOL  N/A 08/27/2018   Procedure: COLONOSCOPY WITH PROPOFOL ;  Surgeon: [name], MD;  Location: [facility] ENDOSCOPY;  Service: Endoscopy;  Laterality: N/A;   COLONOSCOPY WITH PROPOFOL  N/A 08/16/2021   Procedure: COLONOSCOPY WITH PROPOFOL ;  Surgeon: [name], MD;  Location: [facility] ENDOSCOPY;  Service: Endoscopy;  Laterality: N/A;   DIAGNOSTIC LAPAROSCOPY     DILATION AND CURETTAGE OF UTERUS N/A 05/31/2014   Procedure: DILATATION AND CURETTAGE WITH ULTRASOUND GUIDANCE;  Surgeon: Rosaline LITTIE Cobble, MD;  Location: [facility];  Service: Gynecology;  Laterality: N/A;   ESOPHAGOGASTRODUODENOSCOPY  04/20/2003   KNEE SURGERY  1962   MASTECTOMY     right eye surgery     SALPINGOOPHORECTOMY Bilateral 11/08/2016   Procedure: BILATERAL SALPINGO OOPHORECTOMY;  Surgeon: [name], MD;  Location: [facility];  Service: Gynecology;  Laterality: Bilateral;   UPPER GI ENDOSCOPY     Social History   Tobacco Use   Smoking status: Never   Smokeless tobacco: Never  Vaping Use   Vaping status: Never  Used  Substance Use Topics   Alcohol use: No    Alcohol/week: 0.0 standard drinks of alcohol   Drug use: No   Family History  Problem Relation Age of Onset   Atrial fibrillation Mother    Coronary artery disease Mother    Congenital heart disease Mother    Heart attack Father 14   Hypertension Father    Diabetes Maternal Aunt    Cancer Maternal Aunt        leukemia   Diabetes Maternal Uncle    Cancer Maternal Uncle        colon   Diabetes Other        Grandmother   Coronary artery disease Other        Grandmother   Uterine cancer Other        Grandmother   Leukemia Other        Aunt   Allergies  Allergen Reactions   Codeine Nausea And Vomiting   Fentanyl  Nausea And Vomiting   Hydromorphone  Nausea And Vomiting        Morphine Nausea And Vomiting   Ciprofloxacin  Other (See Comments)    Intolerant- tingling  ciprofloxacin    Tape     Surgical tape   Prednisone Palpitations    Rapid hear beat   Current Outpatient Medications on File Prior to Visit  Medication Sig Dispense Refill   aspirin EC 81 MG tablet Take 81 mg by mouth daily.     atorvastatin  (LIPITOR) 10 MG tablet TAKE 1 TABLET BY MOUTH DAILY 90 tablet 0   CALCIUM  PO Take 1,500 mg by mouth daily.     carboxymethylcellulose (REFRESH PLUS) 0.5 % SOLN 1 drop 3 (three) times daily as needed.     chlorthalidone  (HYGROTON ) 25 MG tablet TAKE TWO TABLETS EVERY DAY AS NEEDED WILDER  180 tablet 0   Cholecalciferol (VITAMIN D ) 2000 UNITS CAPS Take 1 capsule by mouth daily.     denosumab  (PROLIA ) 60 MG/ML SOSY injection Inject 60 mg into the skin every 6 (six) months. 1 mL 0   diclofenac  sodium (VOLTAREN ) 1 % GEL Apply 2 g topically 4 (four) times daily. Rub into affected area of foot 2 to 4 times daily (Patient taking differently: Apply 2 g topically as needed. Rub into affected area of foot 2 to 4 times daily) 100 g 2   dicyclomine  (BENTYL ) 10 MG capsule TAKE 1 CAPSULE BY MOUTH TWICE DAILY FOR SPASMS 180 capsule 2    escitalopram  (LEXAPRO ) 5 MG tablet TAKE 1 TABLET BY MOUTH DAILY (Patient taking differently: Take 10 mg by mouth daily.) 90 tablet 1   famotidine  (PEPCID ) 20 MG tablet TAKE ONE TABLET EVERY DAY AS NEEDED FOR HEARTBURN OR INDIGESTION 90 tablet 3   Flaxseed, Linseed, (FLAX SEED OIL PO) Take by mouth. 2 a day     fluticasone  (FLONASE ) 50 MCG/ACT nasal spray SPRAY TWICE INTO EACH NOSTRIL ONCE DAILY 16 g 3   levothyroxine  (SYNTHROID ) 75 MCG tablet Take 1 tablet (75 mcg total) by mouth daily. 90 tablet 0   Multiple Vitamin (MULTIVITAMIN) capsule Take 1 capsule by mouth daily.     Propylene Glycol (SYSTANE COMPLETE OP) Apply to eye.     Current Facility-Administered Medications on File Prior to Visit  Medication Dose Route Frequency Provider Last Rate Last Admin   [START ON 12/03/2024] denosumab  (PROLIA ) injection 60 mg  60 mg Subcutaneous Once Tauren Delbuono A, MD        Review of Systems  Constitutional:  Negative for activity change, appetite change, fatigue, fever and unexpected weight change.  HENT:  Negative for congestion, ear pain, rhinorrhea, sinus pressure and sore throat.   Eyes:  Negative for pain, redness and visual disturbance.  Respiratory:  Negative for cough, shortness of breath and wheezing.   Cardiovascular:  Negative for chest pain and palpitations.  Gastrointestinal:  Negative for abdominal pain, blood in stool, constipation and diarrhea.  Endocrine: Negative for polydipsia and polyuria.  Genitourinary:  Negative for dysuria, frequency and urgency.  Musculoskeletal:  Negative for arthralgias, back pain and myalgias.  Skin:  Negative for pallor and rash.  Allergic/Immunologic: Negative for environmental allergies.  Neurological:  Negative for dizziness, syncope and headaches.  Hematological:  Negative for adenopathy. Does not bruise/bleed easily.  Psychiatric/Behavioral:  Positive for decreased concentration. Negative for dysphoric mood. The patient is not nervous/anxious.         Brain fog Trouble concentrating        Objective:   Physical Exam Constitutional:      General: She is not in acute distress.    Appearance: Normal appearance. She is well-developed and normal weight. She is not ill-appearing or diaphoretic.  HENT:     Head: Normocephalic and atraumatic.     Right Ear: Tympanic membrane, ear canal and external ear normal.     Left Ear: Tympanic membrane, ear canal and external ear normal.     Nose: Nose normal. No congestion.     Mouth/Throat:     Mouth: Mucous membranes are moist.     Pharynx: Oropharynx is clear. No posterior oropharyngeal erythema.  Eyes:     General: No scleral icterus.    Extraocular Movements: Extraocular movements intact.     Conjunctiva/sclera: Conjunctivae normal.     Pupils: Pupils are equal, round, and reactive to light.  Neck:     Thyroid : No thyromegaly.     Vascular: No carotid bruit or JVD.  Cardiovascular:     Rate and Rhythm: Normal rate and regular rhythm.     Pulses: Normal pulses.     Heart sounds: Normal heart sounds.     No gallop.  Pulmonary:     Effort: Pulmonary effort is normal. No respiratory distress.     Breath sounds: Normal breath sounds. No wheezing.     Comments: Good air exch Chest:     Chest wall: No tenderness.  Abdominal:     General: Bowel sounds are normal. There is no distension or abdominal bruit.     Palpations: Abdomen is soft. There is no mass.     Tenderness: There is no abdominal tenderness.     Hernia: No hernia is present.  Genitourinary:    Comments: Breast exam: No mass, nodules, thickening, tenderness, bulging, retraction, inflamation, nipple discharge or skin changes noted.  No axillary or clavicular LA.      Baseline surgical changes noted  Musculoskeletal:        General: No tenderness. Normal range of motion.     Cervical back: Normal range of motion and neck supple. No rigidity. No muscular tenderness.     Right lower leg: No edema.     Left lower leg: No edema.      Comments: No kyphosis   Lymphadenopathy:     Cervical: No cervical adenopathy.  Skin:    General: Skin is warm and dry.     Coloration: Skin is not pale.     Findings: No erythema or rash.     Comments: Solar lentigines diffusely   Neurological:     Mental Status: She is alert. Mental status is at baseline.     Cranial Nerves: No cranial nerve deficit.     Motor: No abnormal muscle tone.     Coordination: Coordination normal.     Gait: Gait normal.     Deep Tendon Reflexes: Reflexes are normal and symmetric. Reflexes normal.  Psychiatric:        Mood and Affect: Mood normal.        Cognition and Memory: Cognition and memory normal.     Comments: Fairly sharp today   Candidly discusses symptoms and stressors             Assessment & Plan:   Problem List Items Addressed This Visit       Respiratory   Allergic rhinitis   Continues flonase  May have a cold -worse congestion after travel Reassuring exam  Update if not starting to improve in a week or if worsening  Call back and Er precautions noted in detail today          Endocrine   Hypothyroidism   Under care of endocrinology TSH 1.733 on 8/6 No clinical changes Continues levothyroxine  75 mcg daily        Musculoskeletal and Integument   Osteoporosis   Dexa planned later this mo On prolia  No falls/fracture   Discussed fall prevention, supplements and exercise for bone density          Other   Routine general medical examination at a health care facility - Primary   Reviewed health habits including diet and exercise and skin cancer prevention Reviewed appropriate screening tests for age  Also reviewed health mt list, fam hx and immunization status , as well as social and family history   See HPI Labs  reviewed and ordered Health Maintenance  Topic Date Due   Mammogram  01/29/2024   Medicare Annual Wellness Visit  07/01/2024   COVID-19 Vaccine (4 - 2024-25 season) 10/17/2024*   Flu Shot   03/02/2025*   DTaP/Tdap/Td vaccine (3 - Td or Tdap) 12/11/2027   Pneumococcal Vaccine for age over 25  Completed   DEXA scan (bone density measurement)  Completed   Zoster (Shingles) Vaccine  Completed   HPV Vaccine  Aged Out   Meningitis B Vaccine  Aged Out   Colon Cancer Screening  Discontinued  *Topic was postponed. The date shown is not the original due date.    Mammo and dexa planned this week No longer seeing gyn Discussed fall prevention, supplements and exercise for bone density  PHQ 6   frustrated with brain fog (neuro ref pending)        Pure hypercholesterolemia   Disc goals for lipids and reasons to control them Rev last labs with pt Rev low sat fat diet in detail Fairly stable High HDL  LDL 114        Memory change   Much worse since having covid Brain fog  Also short term memory issues   Referral in place for neurology  Pt continues to stay socially and physically active and does her own finances as well      Encounter for screening mammogram for breast cancer   Pt has mammogram scheduled on 8/27 Personal history of breast cancer       Anxious mood as adjustment reaction   Continues lexapro  at 10 mg daily  Is helpful  Still struggling with brain fog and has neuro referral for this   Encouraged good self care and active lifestyle

## 2024-07-27 NOTE — Patient Instructions (Addendum)
 I will check on status of your neurology referral   Get your mammogram and bone density test as scheduled   Get back to walking when you can (or other exercise like pedaling or water exercise)  Add some strength training to your routine, this is important for bone and brain health and can reduce your risk of falls and help your body use insulin properly and regulate weight  Light weights, exercise bands , and internet videos are a good way to start  Yoga (chair or regular), machines , floor exercises or a gym with machines are also good options   Continue to hold the vitamin B12   Take care of yourself   Keep eating a healthy diet   Continue flonase  for nasal symptoms   Follow up if no improvement in the next 2 weeks or if worse

## 2024-07-27 NOTE — Assessment & Plan Note (Signed)
 Disc goals for lipids and reasons to control them Rev last labs with pt Rev low sat fat diet in detail Fairly stable High HDL  LDL 114

## 2024-07-27 NOTE — Assessment & Plan Note (Signed)
 Reviewed health habits including diet and exercise and skin cancer prevention Reviewed appropriate screening tests for age  Also reviewed health mt list, fam hx and immunization status , as well as social and family history   See HPI Labs reviewed and ordered Health Maintenance  Topic Date Due   Mammogram  01/29/2024   Medicare Annual Wellness Visit  07/01/2024   COVID-19 Vaccine (4 - 2024-25 season) 10/17/2024*   Flu Shot  03/02/2025*   DTaP/Tdap/Td vaccine (3 - Td or Tdap) 12/11/2027   Pneumococcal Vaccine for age over 4  Completed   DEXA scan (bone density measurement)  Completed   Zoster (Shingles) Vaccine  Completed   HPV Vaccine  Aged Out   Meningitis B Vaccine  Aged Out   Colon Cancer Screening  Discontinued  *Topic was postponed. The date shown is not the original due date.    Mammo and dexa planned this week No longer seeing gyn Discussed fall prevention, supplements and exercise for bone density  PHQ 6   frustrated with brain fog (neuro ref pending)

## 2024-07-27 NOTE — Assessment & Plan Note (Signed)
 Under care of endocrinology TSH 1.733 on 8/6 No clinical changes Continues levothyroxine  75 mcg daily

## 2024-07-27 NOTE — Assessment & Plan Note (Signed)
 Continues flonase  May have a cold -worse congestion after travel Reassuring exam  Update if not starting to improve in a week or if worsening  Call back and Er precautions noted in detail today

## 2024-07-27 NOTE — Assessment & Plan Note (Signed)
 Continues lexapro  at 10 mg daily  Is helpful  Still struggling with brain fog and has neuro referral for this   Encouraged good self care and active lifestyle

## 2024-07-27 NOTE — Assessment & Plan Note (Signed)
 Dexa planned later this mo On prolia  No falls/fracture   Discussed fall prevention, supplements and exercise for bone density

## 2024-07-29 ENCOUNTER — Ambulatory Visit: Payer: Self-pay | Admitting: Family Medicine

## 2024-07-29 ENCOUNTER — Ambulatory Visit
Admission: RE | Admit: 2024-07-29 | Discharge: 2024-07-29 | Disposition: A | Discharge status: Home / Self Care | Source: Ambulatory Visit | Attending: Family Medicine | Admitting: Family Medicine

## 2024-07-29 ENCOUNTER — Ambulatory Visit: Payer: PPO

## 2024-07-29 DIAGNOSIS — Z1231 Encounter for screening mammogram for malignant neoplasm of breast: Secondary | ICD-10-CM | POA: Diagnosis not present

## 2024-07-29 DIAGNOSIS — E2839 Other primary ovarian failure: Secondary | ICD-10-CM | POA: Insufficient documentation

## 2024-07-29 DIAGNOSIS — M818 Other osteoporosis without current pathological fracture: Secondary | ICD-10-CM | POA: Diagnosis not present

## 2024-07-29 DIAGNOSIS — Z780 Asymptomatic menopausal state: Secondary | ICD-10-CM | POA: Diagnosis not present

## 2024-07-29 DIAGNOSIS — M810 Age-related osteoporosis without current pathological fracture: Secondary | ICD-10-CM | POA: Diagnosis not present

## 2024-07-29 DIAGNOSIS — M8589 Other specified disorders of bone density and structure, multiple sites: Secondary | ICD-10-CM | POA: Diagnosis not present

## 2024-08-06 DIAGNOSIS — C44612 Basal cell carcinoma of skin of right upper limb, including shoulder: Secondary | ICD-10-CM | POA: Diagnosis not present

## 2024-08-06 DIAGNOSIS — C44619 Basal cell carcinoma of skin of left upper limb, including shoulder: Secondary | ICD-10-CM | POA: Diagnosis not present

## 2024-08-06 DIAGNOSIS — L570 Actinic keratosis: Secondary | ICD-10-CM | POA: Diagnosis not present

## 2024-08-07 ENCOUNTER — Other Ambulatory Visit: Payer: PPO

## 2024-08-07 ENCOUNTER — Ambulatory Visit: Payer: PPO

## 2024-09-15 DIAGNOSIS — G3184 Mild cognitive impairment, so stated: Secondary | ICD-10-CM | POA: Diagnosis not present

## 2024-09-15 DIAGNOSIS — F411 Generalized anxiety disorder: Secondary | ICD-10-CM | POA: Diagnosis not present

## 2024-09-16 ENCOUNTER — Telehealth: Payer: Self-pay | Admitting: *Deleted

## 2024-09-16 NOTE — Telephone Encounter (Signed)
 Copied from CRM [id number]. Topic: General - Other >> Sep 16, 2024  3:30 PM Rosina BIRCH wrote: Reason for CRM: patient called wanting the provider to call urology in [place] regards to her thyroids because it was way to high and the report of what came out of the meeting yesterday regarding brain fog  [phone]

## 2024-09-16 NOTE — Telephone Encounter (Signed)
 Pt had a neurology appt (not urologist: pt stated urologist on the phone I did clarify it was neurology),   Pt saw neuro yesterday and they mentioned that she had an elevated TSH in June and asked if she had that rechecked recently. Pt told them she wasn't sure so they told her to f/u with PCP. Pt advised that she has an endo doc that manages her thyroid  and they did recheck her level in Aug and they were in normal range. Pt provided endo doc phone note and advised to f/u with them to have them send their labs to neuro

## 2024-09-16 NOTE — Telephone Encounter (Signed)
 I assume she meant endocrinology/not urology   Please clarify - did she have labs that came back abnormal? If so please send for those so we know what is abnormal  How is she feeling?  She can also call endocrinology herself to update them

## 2024-09-16 NOTE — Telephone Encounter (Signed)
 Please review, I don't see elevated thyroid  labs but will route to PCP to review

## 2024-09-25 DIAGNOSIS — E039 Hypothyroidism, unspecified: Secondary | ICD-10-CM | POA: Diagnosis not present

## 2024-10-01 DIAGNOSIS — Z09 Encounter for follow-up examination after completed treatment for conditions other than malignant neoplasm: Secondary | ICD-10-CM | POA: Diagnosis not present

## 2024-10-01 DIAGNOSIS — L923 Foreign body granuloma of the skin and subcutaneous tissue: Secondary | ICD-10-CM | POA: Diagnosis not present

## 2024-10-01 DIAGNOSIS — C44612 Basal cell carcinoma of skin of right upper limb, including shoulder: Secondary | ICD-10-CM | POA: Diagnosis not present

## 2024-10-01 DIAGNOSIS — D2261 Melanocytic nevi of right upper limb, including shoulder: Secondary | ICD-10-CM | POA: Diagnosis not present

## 2024-10-01 DIAGNOSIS — D2272 Melanocytic nevi of left lower limb, including hip: Secondary | ICD-10-CM | POA: Diagnosis not present

## 2024-10-01 DIAGNOSIS — D2271 Melanocytic nevi of right lower limb, including hip: Secondary | ICD-10-CM | POA: Diagnosis not present

## 2024-10-01 DIAGNOSIS — D2262 Melanocytic nevi of left upper limb, including shoulder: Secondary | ICD-10-CM | POA: Diagnosis not present

## 2024-10-01 DIAGNOSIS — Z872 Personal history of diseases of the skin and subcutaneous tissue: Secondary | ICD-10-CM | POA: Diagnosis not present

## 2024-10-01 DIAGNOSIS — Z08 Encounter for follow-up examination after completed treatment for malignant neoplasm: Secondary | ICD-10-CM | POA: Diagnosis not present

## 2024-10-01 DIAGNOSIS — Z85828 Personal history of other malignant neoplasm of skin: Secondary | ICD-10-CM | POA: Diagnosis not present

## 2024-10-01 DIAGNOSIS — D225 Melanocytic nevi of trunk: Secondary | ICD-10-CM | POA: Diagnosis not present

## 2024-10-01 DIAGNOSIS — D485 Neoplasm of uncertain behavior of skin: Secondary | ICD-10-CM | POA: Diagnosis not present

## 2024-10-12 DIAGNOSIS — E039 Hypothyroidism, unspecified: Secondary | ICD-10-CM | POA: Diagnosis not present

## 2024-10-12 DIAGNOSIS — M810 Age-related osteoporosis without current pathological fracture: Secondary | ICD-10-CM | POA: Diagnosis not present

## 2024-10-12 DIAGNOSIS — R413 Other amnesia: Secondary | ICD-10-CM | POA: Diagnosis not present

## 2024-10-16 ENCOUNTER — Telehealth: Payer: Self-pay | Admitting: Pharmacist

## 2024-10-16 ENCOUNTER — Telehealth: Payer: Self-pay | Admitting: Family Medicine

## 2024-10-16 NOTE — Telephone Encounter (Signed)
 Copied from CRM [id number]. Topic: Clinical - Prescription Issue >> Oct 16, 2024  9:30 AM Macario HERO wrote: Reason for CRM: Patient said that the cost of Prolia  shot has changed and she would like to know if she can stop taking that. -- Patient requesting a call from patient's nurse regarding writing a tier 1 exempt letter.

## 2024-10-16 NOTE — Telephone Encounter (Signed)
 It is an option to go back to alendronate  (fosamax ) since you have had a break from it  It does not look like there is assistance available for the prolia    (unless you choose an alternative medicare plan for 2026-still no guarantees)  Is that ok?   Thanks, Gabrielle Webb

## 2024-10-16 NOTE — Progress Notes (Signed)
 Chart Review Reason: Drug information Question - Medication Coverage  Summary: Osteoporosis - prolia  no longer affordable.   Pertinent PMH: Hx breat cancer, tamoxifen . Low estrogen.   Labs: Renal function: Normal. GFR >60. Lytes and albumin WNL.  VitD WNL.  Fracture Hx: None documented  DEXA: Osteoporosis  Lowest T-score -2.3   Considerations: Bisphosphonate reasonable given lowest cost medication. T-scores in moderate range. None severely reduced. No hx fracture documented. No compelling indication for anabolic agent.   Antiresorptive options include Prolia , bisphosphonate. There are no patient assistance options for Prolia  for Medicare patients at this time   Given cost barriers with Prolia , options include: PO/IV bisphosphonate OR, alternative Medicare plan for 2026 with better prolia  coverage.

## 2024-10-16 NOTE — Telephone Encounter (Signed)
 Copied from CRM #[id number]. Topic: Clinical - Medical Advice >> Oct 16, 2024  4:25 PM Anairis L wrote: Reason for CRM: Returning Rayleen Glendora HERO, [place]  phone call.

## 2024-10-16 NOTE — Telephone Encounter (Signed)
 Will await Dr. Graham response.

## 2024-10-16 NOTE — Telephone Encounter (Signed)
 Pt notified of Dr. Graham comments. Pt is going to check with her insurance regarding the tier and price of fosamax  and if she decides to get a Rx she will call back and let us  know

## 2024-10-16 NOTE — Telephone Encounter (Signed)
 Left VM requesting pt to call the office back

## 2024-10-16 NOTE — Telephone Encounter (Signed)
 There can be regression of bone mass when stopping this medication/is not optimal to stop  Will cc Joellen and also Lindsay/pharm for more info re: affordability  Thanks

## 2024-10-19 ENCOUNTER — Encounter: Payer: Self-pay | Admitting: Pharmacist

## 2024-10-19 NOTE — Progress Notes (Signed)
 Chart Review Reason: Drug information Question - patient reports insurance told her Fosamax  is not a covered medication.   Summary: Alendronate  test claim through insurance = $0 for 30=-day supply.   Shared with PCP/PCP pool

## 2024-10-19 NOTE — Telephone Encounter (Signed)
 Pt called in with insurance company and I provided spelling of generic fosamax . He stated there was a glitch in the system and the number are running differently and will have patient call back to update us  on choice.

## 2024-10-19 NOTE — Telephone Encounter (Signed)
 Copied from CRM #[id number]. Topic: Clinical - Medication Question >> Oct 19, 2024 11:15 AM [name] wrote: Reason for CRM: Patient called in regarding Gabrielle Webb, stated fosamax   is not covered either , so is looking for another alternative

## 2024-10-19 NOTE — Telephone Encounter (Signed)
 Patient called in again  requesting a phone call from CMA shapale.

## 2024-10-19 NOTE — Telephone Encounter (Signed)
 Please let pt know the generic fosamax  should be 0$ according to pharmacist here  I can send it in and see how coverage is at her pharmacy-is that ok?

## 2024-10-19 NOTE — Telephone Encounter (Signed)
 Copied from CRM #[id number]. Topic: Clinical - Medication Question >> Oct 19, 2024  2:44 PM Alfonso HERO wrote: Reason for CRM: Patient calling back in regards to previous calls about the fosamax . Asking if the medication come in a tablet form. Stating if its in a tablet form they know the pricing for that and it will be better for the patient.

## 2024-10-19 NOTE — Telephone Encounter (Signed)
 It is a weekly tablet

## 2024-10-19 NOTE — Telephone Encounter (Signed)
 I have never seen that not covered  Generic name is alendronate  (perhaps she searched name brand fosamax ) Will cc Manuelita  Thanks for the help

## 2024-10-20 ENCOUNTER — Telehealth: Payer: Self-pay | Admitting: Family Medicine

## 2024-10-20 MED ORDER — ALENDRONATE SODIUM 70 MG PO TABS
70.0000 mg | ORAL_TABLET | ORAL | 2 refills | Status: AC
Start: 2024-10-20 — End: ?

## 2024-10-20 NOTE — Telephone Encounter (Signed)
 Rx sent see other phone note

## 2024-10-20 NOTE — Telephone Encounter (Signed)
 Sent to pharmacy   Follow directions on the medication  Once weekly  On empty stomach with glass of water  No food or meds for 30 minutes

## 2024-10-20 NOTE — Telephone Encounter (Signed)
 Pt notified of Dr. Graham comments and verbalized understanding. She thinks that last time it caused some gerd. Pt advised to make sure she doesn't lay back down after taking meds and if gerd happens after taking this to let us  know

## 2024-10-20 NOTE — Addendum Note (Signed)
 Addended by: RANDEEN HARDY A on: 10/20/2024 01:54 PM   Modules accepted: Orders

## 2024-10-20 NOTE — Telephone Encounter (Signed)
 Copied from CRM [id number]. Topic: General - Call Back - No Documentation >> Oct 20, 2024 11:34 AM Rea BROCKS wrote: Reason for CRM: Message for Dr. Elsworth nurse.   Patient stated that since she can't take the prolia  shots starting in Jan due to insurance. Patient asked if Dr. Randeen can send in the generic alendronate  tablets  as they discussed previously. She think its' the tablet form in blister packet.   [phone] (M)

## 2024-11-04 DIAGNOSIS — R4189 Other symptoms and signs involving cognitive functions and awareness: Secondary | ICD-10-CM | POA: Diagnosis not present

## 2024-11-04 DIAGNOSIS — R413 Other amnesia: Secondary | ICD-10-CM | POA: Diagnosis not present

## 2024-11-04 DIAGNOSIS — F411 Generalized anxiety disorder: Secondary | ICD-10-CM | POA: Diagnosis not present

## 2024-11-17 ENCOUNTER — Other Ambulatory Visit: Payer: Self-pay | Admitting: Family Medicine

## 2024-11-19 ENCOUNTER — Other Ambulatory Visit: Payer: Self-pay

## 2024-11-19 ENCOUNTER — Other Ambulatory Visit (HOSPITAL_COMMUNITY): Payer: Self-pay

## 2024-11-19 NOTE — Progress Notes (Signed)
 Disenrolled - spoke with patient and she will not continue prolia  at this time due to [id number] chart notes show patient will switch to fosamax  for affordability. Confirmed this information with patient.

## 2024-12-01 ENCOUNTER — Ambulatory Visit (INDEPENDENT_AMBULATORY_CARE_PROVIDER_SITE_OTHER)

## 2024-12-01 VITALS — BP 128/78 | Ht 62.5 in | Wt 131.0 lb

## 2024-12-01 DIAGNOSIS — Z0000 Encounter for general adult medical examination without abnormal findings: Secondary | ICD-10-CM

## 2024-12-01 NOTE — Patient Instructions (Signed)
 Gabrielle Webb,  Thank you for taking the time for your Medicare Wellness Visit. I appreciate your continued commitment to your health goals. Please review the care plan we discussed, and feel free to reach out if I can assist you further.  Please note that Annual Wellness Visits do not include a physical exam. Some assessments may be limited, especially if the visit was conducted virtually. If needed, we may recommend an in-person follow-up with your provider.  Ongoing Care Seeing your primary care provider every 3 to 6 months helps us  monitor your health and provide consistent, personalized care.   Referrals If a referral was made during today's visit and you haven't received any updates within two weeks, please contact the referred provider directly to check on the status.  Recommended Screenings:  Health Maintenance  Topic Date Due   Medicare Annual Wellness Visit  07/01/2024   COVID-19 Vaccine (4 - 2025-26 season) 08/03/2024   Breast Cancer Screening  01/29/2025   DTaP/Tdap/Td vaccine (3 - Td or Tdap) 12/11/2027   Pneumococcal Vaccine for age over 97  Completed   Flu Shot  Completed   Osteoporosis screening with Bone Density Scan  Completed   Zoster (Shingles) Vaccine  Completed   Meningitis B Vaccine  Aged Out   Colon Cancer Screening  Discontinued       12/01/2024    8:18 AM  Advanced Directives  Does Patient Have a Medical Advance Directive? Yes  Type of Estate Agent of [place];Living will  Does patient want to make changes to medical advance directive? No - Patient declined  Copy of Healthcare Power of Attorney in Chart? No - copy requested    Vision: Annual vision screenings are recommended for early detection of glaucoma, cataracts, and diabetic retinopathy. These exams can also reveal signs of chronic conditions such as diabetes and high blood pressure.  Dental: Annual dental screenings help detect early signs of oral cancer, gum disease, and  other conditions linked to overall health, including heart disease and diabetes.

## 2024-12-01 NOTE — Progress Notes (Signed)
 "  Chief Complaint  Patient presents with   Medicare Wellness     Subjective:   Gabrielle Webb is a 83 y.o. female who presents for a Medicare Annual Wellness Visit.  Visit info / Clinical Intake: Medicare Wellness Visit Type:: Subsequent Annual Wellness Visit Persons participating in visit and providing information:: patient Medicare Wellness Visit Mode:: In-person (required for WTM) Interpreter Needed?: No Pre-visit prep was completed: yes AWV questionnaire completed by patient prior to visit?: no Living arrangements:: (!) lives alone Patient's Overall Health Status Rating: very good Typical amount of pain: none Does pain affect daily life?: no Are you currently prescribed opioids?: no  Dietary Habits and Nutritional Risks How many meals a day?: 2 Eats fruit and vegetables daily?: (!) no Most meals are obtained by: preparing own meals; eating out In the last 2 weeks, have you had any of the following?: none Diabetic:: no  Functional Status Activities of Daily Living (to include ambulation/medication): Independent Ambulation: Independent Medication Administration: Independent Home Management (perform basic housework or laundry): Independent Manage your own finances?: yes Primary transportation is: driving Concerns about vision?: no *vision screening is required for WTM* Concerns about hearing?: no  Fall Screening Falls in the past year?: 1 Number of falls in past year: 0 Was there an injury with Fall?: 0 Fall Risk Category Calculator: 1 Patient Fall Risk Level: Low Fall Risk  Fall Risk Patient at Risk for Falls Due to: No Fall Risks Fall risk Follow up: Falls evaluation completed; Education provided; Falls prevention discussed  Home and Transportation Safety: All rugs have non-skid backing?: N/A, no rugs All stairs or steps have railings?: yes Grab bars in the bathtub or shower?: yes Have non-skid surface in bathtub or shower?: yes Good home lighting?:  yes Regular seat belt use?: yes Hospital stays in the last year:: no  Cognitive Assessment Difficulty concentrating, remembering, or making decisions? : yes Will 6CIT or Mini Cog be Completed: yes What year is it?: 0 points What month is it?: 0 points Give patient an address phrase to remember (5 components): [address] [place]  About what time is it?: 0 points Count backwards from 20 to 1: 0 points Say the months of the year in reverse: 0 points Repeat the address phrase from earlier: 0 points 6 CIT Score: 0 points  Advance Directives (For Healthcare) Does Patient Have a Medical Advance Directive?: Yes Does patient want to make changes to medical advance directive?: No - Patient declined Type of Advance Directive: Healthcare Power of [place]; Living will Copy of Healthcare Power of Attorney in Chart?: No - copy requested Copy of Living Will in Chart?: No - copy requested  Reviewed/Updated  Reviewed/Updated: Reviewed All (Medical, Surgical, Family, Medications, Allergies, Care Teams, Patient Goals)    Allergies (verified) Codeine, Fentanyl , Hydromorphone , Morphine, Ciprofloxacin , Tape, and Prednisone   Current Medications (verified) Outpatient Encounter Medications as of 12/01/2024  Medication Sig   alendronate  (FOSAMAX ) 70 MG tablet Take 1 tablet (70 mg total) by mouth every 7 (seven) days. Take with a full glass of water on an empty stomach.   aspirin EC 81 MG tablet Take 81 mg by mouth daily.   atorvastatin  (LIPITOR) 10 MG tablet TAKE 1 TABLET BY MOUTH DAILY   CALCIUM  PO Take 1,500 mg by mouth daily.   carboxymethylcellulose (REFRESH PLUS) 0.5 % SOLN 1 drop 3 (three) times daily as needed.   chlorthalidone  (HYGROTON ) 25 MG tablet TAKE TWO TABLETS EVERY DAY AS NEEDED FORSWELLING   Cholecalciferol (VITAMIN  D) 2000 UNITS CAPS Take 1 capsule by mouth daily.   diclofenac  sodium (VOLTAREN ) 1 % GEL Apply 2 g topically 4 (four) times daily. Rub into affected area of  foot 2 to 4 times daily (Patient taking differently: Apply 2 g topically as needed. Rub into affected area of foot 2 to 4 times daily)   dicyclomine  (BENTYL ) 10 MG capsule TAKE 1 CAPSULE BY MOUTH TWICE DAILY FOR SPASMS   escitalopram  (LEXAPRO ) 5 MG tablet TAKE 1 TABLET BY MOUTH DAILY   famotidine  (PEPCID ) 20 MG tablet TAKE ONE TABLET EVERY DAY AS NEEDED FOR HEARTBURN OR INDIGESTION   Flaxseed, Linseed, (FLAX SEED OIL PO) Take by mouth. 2 a day   fluticasone  (FLONASE ) 50 MCG/ACT nasal spray SPRAY TWICE INTO EACH NOSTRIL ONCE DAILY   levothyroxine  (SYNTHROID ) 75 MCG tablet Take 1 tablet (75 mcg total) by mouth daily.   Multiple Vitamin (MULTIVITAMIN) capsule Take 1 capsule by mouth daily.   Propylene Glycol (SYSTANE COMPLETE OP) Apply to eye.   No facility-administered encounter medications on file as of 12/01/2024.    History: Past Medical History:  Diagnosis Date   Allergy    Anxiety    Breast cancer (HCC) 7/12   Right   Cellulitis    Diverticulosis of colon    GERD (gastroesophageal reflux disease)    Headache    sinus   History of hiatal hernia    Hx of radiation therapy 06/20/11 - 07/11/11   right breast   Hypothyroid    Osteoporosis    Personal history of radiation therapy 2012   Right Breast Cancer   PONV (postoperative nausea and vomiting)    Skin cancer    basal and squamous cell   Vertigo    Past Surgical History:  Procedure Laterality Date   ABDOMINAL HYSTERECTOMY N/A 11/08/2016   Procedure: HYSTERECTOMY TOTAL  ABDOMINAL;  Surgeon: [name], MD;  Location: [facility];  Service: Gynecology;  Laterality: N/A;   APPENDECTOMY  07/2007   back sugery  1970   BREAST BIOPSY Left    Benign   BREAST EXCISIONAL BIOPSY Left 1983   BREAST EXCISIONAL BIOPSY Left 1983   BREAST LUMPECTOMY Right 05/16/2011   COLONOSCOPY     COLONOSCOPY WITH PROPOFOL  N/A 08/27/2018   Procedure: COLONOSCOPY WITH PROPOFOL ;  Surgeon: [name], MD;  Location: [facility] ENDOSCOPY;  Service:  Endoscopy;  Laterality: N/A;   COLONOSCOPY WITH PROPOFOL  N/A 08/16/2021   Procedure: COLONOSCOPY WITH PROPOFOL ;  Surgeon: [name], MD;  Location: [facility] ENDOSCOPY;  Service: Endoscopy;  Laterality: N/A;   DIAGNOSTIC LAPAROSCOPY     DILATION AND CURETTAGE OF UTERUS N/A 05/31/2014   Procedure: DILATATION AND CURETTAGE WITH ULTRASOUND GUIDANCE;  Surgeon: Rosaline LITTIE Cobble, MD;  Location: [facility];  Service: Gynecology;  Laterality: N/A;   ESOPHAGOGASTRODUODENOSCOPY  04/20/2003   KNEE SURGERY  1962   MASTECTOMY     right eye surgery     SALPINGOOPHORECTOMY Bilateral 11/08/2016   Procedure: BILATERAL SALPINGO OOPHORECTOMY;  Surgeon: [name], MD;  Location: [facility];  Service: Gynecology;  Laterality: Bilateral;   UPPER GI ENDOSCOPY     Family History  Problem Relation Age of Onset   Atrial fibrillation Mother    Coronary artery disease Mother    Congenital heart disease Mother    Heart attack Father 54   Hypertension Father    Diabetes Maternal Aunt    Cancer Maternal Aunt        leukemia   Diabetes Maternal Uncle  Cancer Maternal Uncle        colon   Diabetes Other        Grandmother   Coronary artery disease Other        Grandmother   Uterine cancer Other        Grandmother   Leukemia Other        Aunt   Breast cancer Neg Hx    Social History   Occupational History   Occupation: engineer, site  Tobacco Use   Smoking status: Never   Smokeless tobacco: Never  Vaping Use   Vaping status: Never Used  Substance and Sexual Activity   Alcohol use: No    Alcohol/week: 0.0 standard drinks of alcohol   Drug use: No   Sexual activity: Not on file   Tobacco Counseling Counseling given: Not Answered  SDOH Screenings   Food Insecurity: No Food Insecurity (12/01/2024)  Housing: Unknown (12/01/2024)  Transportation Needs: No Transportation Needs (12/01/2024)  Utilities: Not At Risk (12/01/2024)  Alcohol Screen: Low Risk (07/02/2023)  Depression (PHQ2-9): Low  Risk (12/01/2024)  Financial Resource Strain: Low Risk  (10/12/2024)   Received from [facility]  Physical Activity: Insufficiently Active (12/01/2024)  Social Connections: Moderately Integrated (12/01/2024)  Stress: No Stress Concern Present (12/01/2024)  Tobacco Use: Low Risk (12/01/2024)  Health Literacy: Adequate Health Literacy (12/01/2024)   See flowsheets for full screening details  Depression Screen PHQ 2 & 9 Depression Scale- Over the past 2 weeks, how often have you been bothered by any of the following problems? Little interest or pleasure in doing things: 0 Feeling down, depressed, or hopeless (PHQ Adolescent also includes...irritable): 0 PHQ-2 Total Score: 0 Trouble falling or staying asleep, or sleeping too much: 0 Feeling tired or having little energy: 2 Poor appetite or overeating (PHQ Adolescent also includes...weight loss): 0 Feeling bad about yourself - or that you are a failure or have let yourself or your family down: 0 Trouble concentrating on things, such as reading the newspaper or watching television (PHQ Adolescent also includes...like school work): 0 Moving or speaking so slowly that other people could have noticed. Or the opposite - being so fidgety or restless that you have been moving around a lot more than usual: 0 Thoughts that you would be better off dead, or of hurting yourself in some way: 0 PHQ-9 Total Score: 6     Goals Addressed             This Visit's Progress    COMPLETED: Increase physical activity       Starting 05/20/2017, I will continue to exercise 15 min every other day.      Patient Stated   On track    Starting 05/26/2018, I will continue to take medications as prescribed.      COMPLETED: Patient Stated       06/22/2020, I will continue to walk everyday for 1 hour.      Patient Stated   On track    Maintain health and activity      COMPLETED: Patient Stated       Get over COVID and get back to being  active.             Objective:    Today's Vitals   12/01/24 0758  Weight: 131 lb (59.4 kg)  Height: 5' 2.5 (1.588 m)   Body mass index is 23.58 kg/m.  Hearing/Vision screen Vision Screening - Comments:: UTD w/visits to Dr Leonce Immunizations and Health Maintenance  Health Maintenance  Topic Date Due   Medicare Annual Wellness (AWV)  07/01/2024   COVID-19 Vaccine (4 - 2025-26 season) 08/03/2024   Mammogram  01/29/2025   DTaP/Tdap/Td (3 - Td or Tdap) 12/11/2027   Pneumococcal Vaccine: 50+ Years  Completed   Influenza Vaccine  Completed   Bone Density Scan  Completed   Zoster Vaccines- Shingrix  Completed   Meningococcal B Vaccine  Aged Out   Colonoscopy  Discontinued        Assessment/Plan:  This is a routine wellness examination for Sherman.  Patient Care Team: Tower, Laine LABOR, MD as PCP - General (Family Medicine) Mat Browning, MD as Consulting Physician (Obstetrics and Gynecology) Rocky Mace, OD as Referring Physician (Ophthalmology) Blair Mt, MD as Referring Physician (Otolaryngology) Isenstein, Arin L, MD as Consulting Physician (Dermatology) Fairy Drilling, DDS as Consulting Physician (Dentistry) Perla Evalene PARAS, MD as Consulting Physician (Cardiology) Fate Morna SAILOR, [facility] (Inactive) as Pharmacist (Pharmacist)  I have personally reviewed and noted the following in the patients chart:   Medical and social history Use of alcohol, tobacco or illicit drugs  Current medications and supplements including opioid prescriptions. Functional ability and status Nutritional status Physical activity Advanced directives List of other physicians Hospitalizations, surgeries, and ER visits in previous 12 months Vitals Screenings to include cognitive, depression, and falls Referrals and appointments  No orders of the defined types were placed in this encounter.  In addition, I have reviewed and discussed with patient certain preventive protocols,  quality metrics, and best practice recommendations. A written personalized care plan for preventive services as well as general preventive health recommendations were provided to patient.   Erminio LITTIE Saris, LPN   [id number]   No follow-ups on file.  After Visit Summary: (In Person-Printed) AVS printed and given to the patient  Nurse Notes: No voiced or noted concerns at this time Patient advised to keep follow-up appointment with PCP (Annual w/PCp) Appointment(s) made: (Aug 2026) HM Addressed: Pt declines Covid vaccine at this time  "

## 2024-12-16 ENCOUNTER — Telehealth: Payer: Self-pay | Admitting: *Deleted

## 2024-12-16 NOTE — Telephone Encounter (Signed)
 Will see patient then Agree with ER and UC precautions

## 2024-12-16 NOTE — Telephone Encounter (Signed)
 Copied from CRM #[id number]. Topic: Clinical - Medical Advice >> Dec 16, 2024  8:22 AM Robinson DEL wrote: Reason for CRM: Patient states she thinks she has a yeast infection, wants to ask Juliene if she thinks Dr. Randeen can do a pap smear on her. Agent offered to schedule her an appointment with Dr. Randeen and patient declined stated she will if Juliene thinks she should.  Annalaura [phone]

## 2024-12-16 NOTE — Telephone Encounter (Signed)
 If she has vaginal symptoms please come in for a visit  We may do a swab , not likely a pap (pap not usually used to find yeast)   Thanks for letting me know   There are yeast products over the counter like monistat that can be tried first as well   Classic yeast vaginitis causes vaginal itching/burning and sometimes discharge

## 2024-12-16 NOTE — Telephone Encounter (Signed)
 Pt said that given her vaginal issues in the past her gyn wants her to get a pap smear yearly. She said gyn even called her to schedule her yearly pap smear however due to pt's brain fog she doesn't feel comfortable driving all the way to her office by herself. Pt wanted to see if PCP can take over checking a pap smear yearly so she doesn't have to drive so far. Pt said she hasn't tried any OTC meds but she did put some vitamin E on the irritation and it made it worse (burning). Pt advise not to use anymore vit E since it burned her. Pt said she doesn't want to try anymore OTC meds and will just wait until PCP can check her out.   Appt scheduled with Dr. Randeen on Monday 12/21/24 (1st available appt). Pt declined seeing someone sooner she said she's been dealing with this for a while and is okay waiting to see PCP to get checked out. Pt said she will discuss if she can get a pap smear here at her OV. She also doesn't want to try the monistat she will just have PCP eval sxs 1st and go from there.

## 2024-12-18 ENCOUNTER — Encounter: Payer: Self-pay | Admitting: Oncology

## 2024-12-21 ENCOUNTER — Ambulatory Visit: Admitting: Family Medicine

## 2024-12-21 ENCOUNTER — Encounter: Payer: Self-pay | Admitting: Family Medicine

## 2024-12-21 VITALS — BP 122/70 | HR 94 | Temp 97.5°F | Ht 62.5 in | Wt 130.4 lb

## 2024-12-21 DIAGNOSIS — N898 Other specified noninflammatory disorders of vagina: Secondary | ICD-10-CM | POA: Insufficient documentation

## 2024-12-21 LAB — POCT WET PREP (WET MOUNT)
Clue Cells Wet Prep Whiff POC: NEGATIVE
Trichomonas Wet Prep HPF POC: ABSENT

## 2024-12-21 MED ORDER — CLOBETASOL PROPIONATE 0.05 % EX CREA: 1.0000 | TOPICAL_CREAM | Freq: Every day | CUTANEOUS | 0 refills | Status: AC

## 2024-12-21 NOTE — Patient Instructions (Signed)
 I think you may have a condition called lichen sclerosis , causing your vaginal irritation   Use the clobetasol  cream , a very small amount to the affected areas daily   Follow up in a month for a re check

## 2024-12-21 NOTE — Progress Notes (Signed)
 "  Subjective:    Patient ID: Gabrielle Webb, female    DOB: Oct 21, 1941, 84 y.o.   MRN: [id number]  HPI  Wt Readings from Last 3 Encounters:  12/21/24 130 lb 6 oz (59.1 kg)  12/01/24 131 lb (59.4 kg)  07/27/24 130 lb (59 kg)   23.47 kg/m  Vitals:   12/21/24 0819  BP: 122/70  Pulse: 94  Temp: (!) 97.5 F (36.4 C)  SpO2: 100%   Pt presents with  Vaginal symptoms   Some burning sensation -vaginal  About a month Area looks red/irritated     No itching  No discharge No odor  Not sexually active    Occational pressure feeling over bladder No urinary symptoms  No pain to urinate   Occational pain in buttocks when sitting for long periods   Used a vitamin E cream and it caused burning    Hysterectomy 2017 - chart notes cervical stenosis and thickened endometrium in setting of past tamoxifen    Personal history of breast cancer  Took tamoxifen  followed by letrozole   Last onc visit 2022 for 10 y follow up and she was released   Wet prep  Results for orders placed or performed in visit on 12/21/24  POCT Wet Prep [place])   Collection Time: 12/21/24  8:48 AM  Result Value Ref Range   Source Wet Prep POC vaginal    WBC, Wet Prep HPF POC few    Bacteria Wet Prep HPF POC     Bacteria Wet Prep Morphology POC     Clue Cells Wet Prep HPF POC None None   Clue Cells Wet Prep Whiff POC Negative Whiff    Yeast Wet Prep HPF POC None None   KOH Wet Prep POC None None   Trichomonas Wet Prep HPF POC Absent Absent     Used to see Dr Mat   Patient Active Problem List   Diagnosis Date Noted   Vaginal irritation 12/21/2024   Arthritis of left wrist 05/25/2024   Tendinosis of left wrist 05/25/2024   Abnormal plain x-ray of wrist 05/22/2024   Left wrist pain 05/22/2024   Episode of confusion 10/28/2023   Memory change 10/02/2023   Left inguinal pain 08/21/2023   Post covid-19 condition, unspecified 07/05/2023   Skin lesion 05/02/2023   Right knee pain  04/24/2023   Grief reaction 07/04/2022   Encounter for screening mammogram for breast cancer 12/18/2021   Anxious mood as adjustment reaction 06/09/2021   History of breast cancer 05/03/2020   Medicare annual wellness visit, subsequent 05/28/2018   Estrogen deficiency 05/28/2018   Fatigue 05/22/2017   S/P TAH-BSO 11/08/2016   Routine general medical examination at a health care facility 05/06/2016   History of tamoxifen  therapy 05/18/2014   Encounter for routine gynecological examination 05/06/2013   Colon cancer screening 05/06/2013   Encounter for Medicare annual wellness exam 04/28/2013   Hx of radiation therapy    Hx of Breast cancer, stage 1, Right, UOQ, Receptor+,Her2- 05/29/2011    Class: Stage 1   Post-menopausal 03/21/2011   Hypothyroidism 01/23/2008   Pure hypercholesterolemia 01/23/2008   Allergic rhinitis 01/23/2008   GERD 01/23/2008   Diverticulosis of colon 01/23/2008   IBS 01/23/2008   FIBROCYSTIC BREAST DISEASE 01/23/2008   Osteoporosis 10/17/2007   Past Medical History:  Diagnosis Date   Allergy    Anxiety    Breast cancer (HCC) 7/12   Right   Cellulitis    Diverticulosis of colon  GERD (gastroesophageal reflux disease)    Headache    sinus   History of hiatal hernia    Hx of radiation therapy 06/20/11 - 07/11/11   right breast   Hypothyroid    Osteoporosis    Personal history of radiation therapy 2012   Right Breast Cancer   PONV (postoperative nausea and vomiting)    Skin cancer    basal and squamous cell   Vertigo    Past Surgical History:  Procedure Laterality Date   ABDOMINAL HYSTERECTOMY N/A 11/08/2016   Procedure: HYSTERECTOMY TOTAL  ABDOMINAL;  Surgeon: [name], MD;  Location: [facility];  Service: Gynecology;  Laterality: N/A;   APPENDECTOMY  07/2007   back sugery  1970   BREAST BIOPSY Left    Benign   BREAST EXCISIONAL BIOPSY Left 1983   BREAST EXCISIONAL BIOPSY Left 1983   BREAST LUMPECTOMY Right 05/16/2011   COLONOSCOPY      COLONOSCOPY WITH PROPOFOL  N/A 08/27/2018   Procedure: COLONOSCOPY WITH PROPOFOL ;  Surgeon: [name], MD;  Location: [facility];  Service: Endoscopy;  Laterality: N/A;   COLONOSCOPY WITH PROPOFOL  N/A 08/16/2021   Procedure: COLONOSCOPY WITH PROPOFOL ;  Surgeon: [name], MD;  Location: [facility] ENDOSCOPY;  Service: Endoscopy;  Laterality: N/A;   DIAGNOSTIC LAPAROSCOPY     DILATION AND CURETTAGE OF UTERUS N/A 05/31/2014   Procedure: DILATATION AND CURETTAGE WITH ULTRASOUND GUIDANCE;  Surgeon: Rosaline LITTIE Cobble, MD;  Location: [facility];  Service: Gynecology;  Laterality: N/A;   ESOPHAGOGASTRODUODENOSCOPY  04/20/2003   KNEE SURGERY  1962   MASTECTOMY     right eye surgery     SALPINGOOPHORECTOMY Bilateral 11/08/2016   Procedure: BILATERAL SALPINGO OOPHORECTOMY;  Surgeon: [name], MD;  Location: [facility];  Service: Gynecology;  Laterality: Bilateral;   UPPER GI ENDOSCOPY     Social History[1] Family History  Problem Relation Age of Onset   Atrial fibrillation Mother    Coronary artery disease Mother    Congenital heart disease Mother    Heart attack Father 21   Hypertension Father    Diabetes Maternal Aunt    Cancer Maternal Aunt        leukemia   Diabetes Maternal Uncle    Cancer Maternal Uncle        colon   Diabetes Other        Grandmother   Coronary artery disease Other        Grandmother   Uterine cancer Other        Grandmother   Leukemia Other        Aunt   Breast cancer Neg Hx    Allergies[2] Medications Ordered Prior to Encounter[3]  Review of Systems  Constitutional:  Negative for activity change, appetite change, fatigue, fever and unexpected weight change.  HENT:  Negative for congestion, ear pain, rhinorrhea, sinus pressure and sore throat.   Eyes:  Negative for pain, redness and visual disturbance.  Respiratory:  Negative for cough, shortness of breath and wheezing.   Cardiovascular:  Negative for chest pain and palpitations.   Gastrointestinal:  Negative for abdominal pain, blood in stool, constipation and diarrhea.  Endocrine: Negative for polydipsia and polyuria.  Genitourinary:  Positive for vaginal pain. Negative for decreased urine volume, dysuria, frequency, genital sores, hematuria, pelvic pain, urgency, vaginal bleeding and vaginal discharge.  Musculoskeletal:  Negative for arthralgias, back pain and myalgias.       Some pain in buttocks with sitting Improves with donut pillow  Skin:  Negative for pallor and rash.  Allergic/Immunologic: Negative for environmental allergies.  Neurological:  Negative for dizziness, syncope and headaches.  Hematological:  Negative for adenopathy. Does not bruise/bleed easily.  Psychiatric/Behavioral:  Negative for decreased concentration and dysphoric mood. The patient is not nervous/anxious.        Objective:   Physical Exam Exam conducted with a chaperone present.  Constitutional:      General: She is not in acute distress.    Appearance: Normal appearance. She is normal weight. She is not ill-appearing.  Cardiovascular:     Rate and Rhythm: Normal rate and regular rhythm.  Pulmonary:     Effort: Pulmonary effort is normal. No respiratory distress.  Abdominal:     Hernia: There is no hernia in the left inguinal area or right inguinal area.  Genitourinary:    Exam position: Supine.     Pubic Area: No rash or pubic lice.      Labia:        Right: No tenderness or injury.        Left: No tenderness or injury.      Urethra: No prolapse, urethral swelling or urethral lesion.     Uterus: Absent.      Comments: Some small white patches noted on labia minora (more on left side) consistent with lichen sclerosis  No discharge  No tenderness Some vaginal atrophy   No discomfort on bimanual exam Cervix/uterus surgically absent   External rectum appears normal  Lymphadenopathy:     Lower Body: No right inguinal adenopathy. No left inguinal adenopathy.  Skin:     Findings: No bruising or lesion.  Neurological:     Mental Status: She is alert.  Psychiatric:        Attention and Perception: Attention normal.        Mood and Affect: Mood is anxious.     Comments: Mildly anxious            Assessment & Plan:   Problem List Items Addressed This Visit       Other   Vaginal irritation - Primary   Symptoms for about a month , irritated/burning sensation (no itching or d/c or odor)  In pt who is not sexually active , who had hysterectomy in past and also past history of breast cancer   On exam small area of white discoloration on labia minora (more on the left than right)  Atrophic vaginal mucosa as well Neg wet prep for vaginitis   Consistent with mild lichen sclerosis  Will treat with clobetasol  cream 0.05% to affected vulvar areas daily  Re check in a month and hope will be able to reduce /titrate down Encouraged to call if not improving with this   May benefit from vaginal estrogen cream in future (depending on risks with past breast cancer)       Relevant Orders   POCT Wet Prep (Wet [place]) (Completed)      [1]  Social History Tobacco Use   Smoking status: Never   Smokeless tobacco: Never  Vaping Use   Vaping status: Never Used  Substance Use Topics   Alcohol use: No    Alcohol/week: 0.0 standard drinks of alcohol   Drug use: No  [2]  Allergies Allergen Reactions   Codeine Nausea And Vomiting   Fentanyl  Nausea And Vomiting   Hydromorphone  Nausea And Vomiting        Morphine Nausea And Vomiting   Ciprofloxacin  Other (See Comments)    Intolerant-  tingling  ciprofloxacin    Tape     Surgical tape   Prednisone Palpitations    Rapid hear beat  [3]  Current Outpatient Medications on File Prior to Visit  Medication Sig Dispense Refill   alendronate  (FOSAMAX ) 70 MG tablet Take 1 tablet (70 mg total) by mouth every 7 (seven) days. Take with a full glass of water on an empty stomach. 12 tablet 2   aspirin EC 81 MG  tablet Take 81 mg by mouth daily.     atorvastatin  (LIPITOR) 10 MG tablet TAKE 1 TABLET BY MOUTH DAILY 90 tablet 0   CALCIUM  PO Take 1,500 mg by mouth daily.     carboxymethylcellulose (REFRESH PLUS) 0.5 % SOLN 1 drop 3 (three) times daily as needed.     chlorthalidone  (HYGROTON ) 25 MG tablet TAKE TWO TABLETS EVERY DAY AS NEEDED FORSWELLING 180 tablet 0   Cholecalciferol (VITAMIN D ) 2000 UNITS CAPS Take 1 capsule by mouth daily.     diclofenac  sodium (VOLTAREN ) 1 % GEL Apply 2 g topically 4 (four) times daily. Rub into affected area of foot 2 to 4 times daily (Patient taking differently: Apply 2 g topically as needed. Rub into affected area of foot 2 to 4 times daily) 100 g 2   dicyclomine  (BENTYL ) 10 MG capsule TAKE 1 CAPSULE BY MOUTH TWICE DAILY FOR SPASMS 180 capsule 2   escitalopram  (LEXAPRO ) 5 MG tablet TAKE 1 TABLET BY MOUTH DAILY 90 tablet 1   famotidine  (PEPCID ) 20 MG tablet TAKE ONE TABLET EVERY DAY AS NEEDED FOR HEARTBURN OR INDIGESTION 90 tablet 3   Flaxseed, Linseed, (FLAX SEED OIL PO) Take by mouth. 2 a day     fluticasone  (FLONASE ) 50 MCG/ACT nasal spray SPRAY TWICE INTO EACH NOSTRIL ONCE DAILY 16 g 3   levothyroxine  (SYNTHROID ) 75 MCG tablet Take 1 tablet (75 mcg total) by mouth daily. 90 tablet 0   Multiple Vitamin (MULTIVITAMIN) capsule Take 1 capsule by mouth daily.     Propylene Glycol (SYSTANE COMPLETE OP) Apply to eye.     No current facility-administered medications on file prior to visit.   "

## 2024-12-21 NOTE — Assessment & Plan Note (Signed)
 Symptoms for about a month , irritated/burning sensation (no itching or d/c or odor)  In pt who is not sexually active , who had hysterectomy in past and also past history of breast cancer   On exam small area of white discoloration on labia minora (more on the left than right)  Atrophic vaginal mucosa as well Neg wet prep for vaginitis   Consistent with mild lichen sclerosis  Will treat with clobetasol  cream 0.05% to affected vulvar areas daily  Re check in a month and hope will be able to reduce /titrate down Encouraged to call if not improving with this   May benefit from vaginal estrogen cream in future (depending on risks with past breast cancer)

## 2024-12-24 ENCOUNTER — Telehealth: Payer: Self-pay | Admitting: Family Medicine

## 2024-12-24 NOTE — Telephone Encounter (Signed)
 No clue about air travel but I expect that if she shows her handicapped status in airport they could accommodate ?  Happy to do handicapped parking form

## 2024-12-24 NOTE — Telephone Encounter (Signed)
 Copied from CRM #[id number]. Topic: General - Other >> Dec 24, 2024 10:20 AM Robinson H wrote: Reason for CRM: Patient calling to find out about having a handicap plaque for car form completed, patient wants to know if office has forms and is there a separate form used for airplanes.  [place] [phone]

## 2024-12-24 NOTE — Telephone Encounter (Signed)
 Is it okay to fill our form?  Not sure about air plane

## 2024-12-24 NOTE — Telephone Encounter (Signed)
 Form in your inbox

## 2024-12-24 NOTE — Telephone Encounter (Signed)
 Pt notified to contact the airport directly to see what accomodation they offer, we don't handle that. Pt also notified handicap form is ready for pick up

## 2024-12-24 NOTE — Telephone Encounter (Signed)
 Done and in IN box

## 2024-12-25 ENCOUNTER — Telehealth: Payer: Self-pay | Admitting: Family Medicine

## 2024-12-25 NOTE — Telephone Encounter (Signed)
 We did not do a pap (discussed this at her visit)  We did a wet prep vaginal swab which was normal   I gave her clobetasol  cream to try for the vaginal condition (I suspect lichen sclerosis) and planned follow up   Send her the AVS if she has lost it   Thanks

## 2024-12-25 NOTE — Telephone Encounter (Signed)
 Pt would like a call back with pap results.

## 2024-12-25 NOTE — Telephone Encounter (Signed)
 Pt said she forgot that conversation with PCP but remembered after I advised her of PCP's comments she said her brain fog is bad and apologizes for the confusion she still has her check out papers I advise her to review them also but if she has any other questions she'll call back.   FYI to PCP

## 2025-01-08 ENCOUNTER — Ambulatory Visit: Payer: Self-pay | Admitting: Family Medicine

## 2025-01-08 NOTE — Telephone Encounter (Signed)
 Any vaginal discharge/ odor ?  Any pelvic pain ?   Since she is not improving with the clobetasol  for lichen sclerosis-it may be time to set her up with a gyn provider

## 2025-01-08 NOTE — Telephone Encounter (Signed)
 Left message to return call to our office.  Please provide  message from provider/office when call is returned from patient.

## 2025-01-08 NOTE — Telephone Encounter (Signed)
 FYI Only or Action Required?: Action required by provider: clinical question for provider.  Patient was last seen in primary care on 12/21/2024 by Randeen Laine LABOR, MD.  Called Nurse Triage reporting Vaginitis.  Symptoms began several weeks ago.  Interventions attempted: Prescription medications: clobetasol .  Symptoms are: unchanged.  Triage Disposition: See PCP Within 2 Weeks  Patient/caregiver understands and will follow disposition?: No, refuses disposition  Reason for Disposition  All other vaginal symptoms  (Exceptions: Feels like prior yeast infection, minor abrasion, mild rash < 24 hour duration, mild itching, vaginal dryness during sex.)  Answer Assessment - Initial Assessment Questions Pt requesting rx for yeast infection, advised may require appt.   Pt was prescribed clobetasol  on 12/21/24 for lichens sclerosis. Pt reports she continues to have irritation, no improvement.   States is worse when she wears jeans. Does wear cotton underwear and pantiliners. Denies discharge or pruritus.   Patient states that when she has had a yeast infection in the past, her symptoms have been the same. Is requesting alternate medication, states historically tx with inserts.   Also asking if she can apply topical vit E cream.  1. SYMPTOM: What's the main symptom you're concerned about? (e.g., pain, itching, dryness)     Redness 2. LOCATION:      Vulva and skin folds 3. ONSET:      12/17/24 4. PAIN: Is there any pain? If Yes, ask: How bad is it? (Scale: 1-10; mild, moderate, severe)     Irritation and redness 5. ITCHING: Is there any itching? If Yes, ask: How bad is it? (Scale: 1-10; mild, moderate, severe)     Denies 6. CAUSE: What do you think is causing the discharge? Have you had the same problem before? What happened then?     Pt believes possible yeast infection 7. OTHER SYMPTOMS: Do you have any other symptoms? (e.g., fever, itching, vaginal bleeding, pain with  urination, injury to genital area, vaginal foreign body)     Denies  Protocols used: Vaginal Symptoms-A-AH

## 2025-01-21 ENCOUNTER — Ambulatory Visit: Admitting: Family Medicine

## 2025-07-21 ENCOUNTER — Other Ambulatory Visit

## 2025-07-28 ENCOUNTER — Encounter: Admitting: Family Medicine
